# Patient Record
Sex: Male | Born: 1985
Health system: Southern US, Community
[De-identification: ages and names within clinical notes are randomized; demographics above are authoritative.]

## PROBLEM LIST (undated history)

## (undated) ENCOUNTER — Emergency Department (HOSPITAL_COMMUNITY)

## (undated) DIAGNOSIS — F329 Major depressive disorder, single episode, unspecified: Secondary | ICD-10-CM

## (undated) DIAGNOSIS — H5319 Other subjective visual disturbances: Secondary | ICD-10-CM

## (undated) DIAGNOSIS — M545 Low back pain: Secondary | ICD-10-CM

## (undated) DIAGNOSIS — F909 Attention-deficit hyperactivity disorder, unspecified type: Secondary | ICD-10-CM

## (undated) DIAGNOSIS — E119 Type 2 diabetes mellitus without complications: Secondary | ICD-10-CM

## (undated) DIAGNOSIS — H16001 Unspecified corneal ulcer, right eye: Secondary | ICD-10-CM

## (undated) HISTORY — DX: Attention-deficit hyperactivity disorder, unspecified type: F90.9

## (undated) HISTORY — DX: Major depressive disorder, single episode, unspecified: F32.9

## (undated) HISTORY — DX: Other subjective visual disturbances: H53.19

## (undated) HISTORY — DX: Type 2 diabetes mellitus without complications: E11.9

## (undated) HISTORY — PX: WISDOM TOOTH EXTRACTION: SHX21

## (undated) HISTORY — DX: Unspecified corneal ulcer, right eye: H16.001

## (undated) HISTORY — DX: Low back pain: M54.5

---
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
## 2006-09-30 ENCOUNTER — Ambulatory Visit (HOSPITAL_COMMUNITY): Payer: Self-pay | Admitting: Psychiatry

## 2006-10-23 ENCOUNTER — Ambulatory Visit (HOSPITAL_COMMUNITY): Payer: Self-pay | Admitting: Psychiatry

## 2006-11-25 ENCOUNTER — Ambulatory Visit (HOSPITAL_COMMUNITY): Payer: Self-pay | Admitting: Psychiatry

## 2007-01-20 ENCOUNTER — Ambulatory Visit (HOSPITAL_COMMUNITY): Payer: Self-pay | Admitting: Psychiatry

## 2007-04-21 ENCOUNTER — Ambulatory Visit (HOSPITAL_COMMUNITY): Payer: Self-pay | Admitting: Psychiatry

## 2007-07-16 ENCOUNTER — Ambulatory Visit (HOSPITAL_COMMUNITY): Payer: Self-pay | Admitting: Psychiatry

## 2008-01-23 ENCOUNTER — Ambulatory Visit (HOSPITAL_COMMUNITY): Payer: Self-pay | Admitting: Psychiatry

## 2008-05-03 ENCOUNTER — Ambulatory Visit (HOSPITAL_COMMUNITY): Payer: Self-pay | Admitting: Psychiatry

## 2008-09-01 ENCOUNTER — Ambulatory Visit (HOSPITAL_COMMUNITY): Payer: Self-pay | Admitting: Psychiatry

## 2008-10-06 ENCOUNTER — Ambulatory Visit (HOSPITAL_COMMUNITY): Payer: Self-pay | Admitting: Psychiatry

## 2008-11-17 ENCOUNTER — Ambulatory Visit (HOSPITAL_COMMUNITY): Payer: Self-pay | Admitting: Psychiatry

## 2008-12-27 ENCOUNTER — Ambulatory Visit (HOSPITAL_COMMUNITY): Payer: Self-pay | Admitting: Psychiatry

## 2009-02-02 ENCOUNTER — Ambulatory Visit (HOSPITAL_COMMUNITY): Payer: Self-pay | Admitting: Psychiatry

## 2009-07-13 ENCOUNTER — Ambulatory Visit (HOSPITAL_COMMUNITY): Payer: Self-pay | Admitting: Psychiatry

## 2009-08-22 ENCOUNTER — Ambulatory Visit (HOSPITAL_COMMUNITY): Payer: Self-pay | Admitting: Psychiatry

## 2012-02-12 ENCOUNTER — Ambulatory Visit: Payer: Self-pay | Admitting: Family Medicine

## 2012-02-12 DIAGNOSIS — F988 Other specified behavioral and emotional disorders with onset usually occurring in childhood and adolescence: Secondary | ICD-10-CM

## 2012-02-12 DIAGNOSIS — E119 Type 2 diabetes mellitus without complications: Secondary | ICD-10-CM

## 2012-02-12 LAB — POCT GLYCOSYLATED HEMOGLOBIN (HGB A1C): Hemoglobin A1C: 11.3

## 2012-02-12 MED ORDER — METFORMIN HCL 500 MG PO TABS: ORAL_TABLET | ORAL | Status: DC

## 2012-02-12 NOTE — Progress Notes (Signed)
  Subjective:    Patient ID: Carl Conley, male    DOB: 1986/09/14, 26 y.o.   MRN: [id number]  HPI  Patient presents after long absence from medical care. Diagnosed with DM age 26 Last medical visit 2011  ADD- Dr. Evelene Croon prescribes Adderrall  Applying for medical insurance and wanted to check A!C prior  o the insurance labs.  Student at [employer]  Review of Systems     Objective:   Physical Exam  Constitutional: He appears well-developed.  HENT:  Mouth/Throat: Oropharynx is clear and moist.  Neck: Neck supple. No thyromegaly present.  Cardiovascular: Normal rate, regular rhythm, normal heart sounds and intact distal pulses.   Pulmonary/Chest: Effort normal and breath sounds normal.  Abdominal: Soft.  Neurological: He is alert.  Skin:       No skin breakdown    Very strong body odor  Results for orders placed in visit on 02/12/12  POCT GLYCOSYLATED HEMOGLOBIN (HGB A1C)      Component Value Range   Hemoglobin A1C 11.3    BASIC METABOLIC PANEL      Component Value Range   Sodium 136  135 - 145 (mEq/L)   Potassium 4.3  3.5 - 5.3 (mEq/L)   Chloride 100  96 - 112 (mEq/L)   CO2 24  19 - 32 (mEq/L)   Glucose, Bld 385 (*) 70 - 99 (mg/dL)   BUN 10  6 - 23 (mg/dL)   Creat 4.09  8.11 - 9.14 (mg/dL)   Calcium 9.9  8.4 - 78.2 (mg/dL)       Assessment & Plan:   1. Diabetes mellitus  POCT glycosylated hemoglobin (Hb A1C), Basic metabolic panel, metFORMIN (GLUCOPHAGE) 500 MG tablet  2. ADD (attention deficit disorder)     Tremendous effort to educated patient regarding diabetes. Initially did not want to except any therapy. I encouraged him to start metformin, limit carbohydrates and exercise regularly. Patient did assure me that he would return once he secured medical insurance.  He is also aware of obtaining care for his DM at [facility] at a reduced cost.

## 2012-02-14 LAB — BASIC METABOLIC PANEL
CO2: 24 mEq/L (ref 19–32)
Chloride: 100 mEq/L (ref 96–112)
Glucose, Bld: 385 mg/dL — ABNORMAL HIGH (ref 70–99)
Potassium: 4.3 mEq/L (ref 3.5–5.3)
Sodium: 136 mEq/L (ref 135–145)

## 2012-02-27 ENCOUNTER — Encounter: Payer: Self-pay | Admitting: *Deleted

## 2016-04-13 ENCOUNTER — Encounter: Payer: Self-pay | Admitting: Dietician

## 2016-04-13 ENCOUNTER — Ambulatory Visit (INDEPENDENT_AMBULATORY_CARE_PROVIDER_SITE_OTHER): Payer: Self-pay | Admitting: Internal Medicine

## 2016-04-13 VITALS — BP 108/79 | HR 98 | Temp 98.6°F | Resp 18 | Ht 69.0 in | Wt 238.3 lb

## 2016-04-13 DIAGNOSIS — Z596 Low income: Secondary | ICD-10-CM

## 2016-04-13 DIAGNOSIS — E1165 Type 2 diabetes mellitus with hyperglycemia: Secondary | ICD-10-CM

## 2016-04-13 DIAGNOSIS — E119 Type 2 diabetes mellitus without complications: Secondary | ICD-10-CM | POA: Insufficient documentation

## 2016-04-13 DIAGNOSIS — F988 Other specified behavioral and emotional disorders with onset usually occurring in childhood and adolescence: Secondary | ICD-10-CM

## 2016-04-13 DIAGNOSIS — H16001 Unspecified corneal ulcer, right eye: Secondary | ICD-10-CM

## 2016-04-13 LAB — POCT GLYCOSYLATED HEMOGLOBIN (HGB A1C): HEMOGLOBIN A1C: 9.6

## 2016-04-13 LAB — GLUCOSE, CAPILLARY: Glucose-Capillary: 340 mg/dL — ABNORMAL HIGH (ref 65–99)

## 2016-04-13 MED ORDER — METFORMIN HCL 1000 MG PO TABS: 1000.0000 mg | ORAL_TABLET | Freq: Two times a day (BID) | ORAL | Status: DC

## 2016-04-13 NOTE — Patient Instructions (Signed)
Mr. Carl Conley,  I'm happy you came to establish care in our clinic today! It was really nice meeting you. Our main plan today is to continue your best to cut out sugars from your diet and exercise as much as you can every day. Keep walking daily. You're doing a great job. We will also start metformin. I've attached a sheet with instructions on how to start. You can start taking the medication tomorrow and go from there. Call our clinic number if you have any other questions. Also make sure to get all the documents on the blue sheet [facility], and we can help you out with getting insurance. Finally, make sure to make an appointment with Lupita Leashonna, our diabetes educator, so we can cater a nutrition plan that works best for you. We would like to see her back in about 3 months to check in on how you're doing, and you can always call us if you have any other questions sooner.  Have a great weekend, Reubin MilanBilly Sadhana Frater

## 2016-04-16 ENCOUNTER — Ambulatory Visit: Payer: Self-pay | Admitting: Pharmacist

## 2016-04-16 DIAGNOSIS — H16001 Unspecified corneal ulcer, right eye: Secondary | ICD-10-CM

## 2016-04-16 DIAGNOSIS — F988 Other specified behavioral and emotional disorders with onset usually occurring in childhood and adolescence: Secondary | ICD-10-CM | POA: Insufficient documentation

## 2016-04-16 HISTORY — DX: Unspecified corneal ulcer, right eye: H16.001

## 2016-04-16 MED ORDER — METFORMIN HCL 1000 MG PO TABS: 1000.0000 mg | ORAL_TABLET | Freq: Two times a day (BID) | ORAL | Status: DC

## 2016-04-16 MED FILL — metFORMIN HCL 1000 MG TABS: 1000 | 30 days supply | Qty: 60 | Fill #0

## 2016-04-16 NOTE — Assessment & Plan Note (Signed)
Patient with previous history of ADD on Adderall. However, he says that he has not been on medication for the past 2 years and his symptoms are well-controlled with focusing exercises and relaxation technique and do not cause him concerns throughout the day today life. -Appears clinically resolved for the past couple of years. No further management necessitated at this time

## 2016-04-16 NOTE — Assessment & Plan Note (Addendum)
Patient was diagnosed with diabetes at age 30, 12 years ago. He was previously on metformin, another oral agent he does not remember the name of, and Lantus. He has not been on any medication for the past several years, without hyperglycemic symptoms such as polyuria, polydipsia, increased fatigue, or other issues. He does say that he has some early numbness without any pain at the tips of his toes bilaterally. Exam does show slight decreased fine touch sensation without any notable lesions. His last A1c was 11.3 back in 2013. He has not been on medication since. 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 He has been vigilant about trying to control his diabetes with dietary modifications as well as regular exercise. He says he walks 1-1/4 miles per day for 5 days per week. He has cut out all sugary beverages and tries to eat carbs in moderation. He says he like to maximize control of his diabetes without medications other than metformin at this time. After extensive counseling, he is agreeable to starting metformin and would like to work with our diabetes educator regarding further ways in which he can maximize his nonpharmacologic treatment of diabetes. -Start metformin, with weekly increases of 500 mg to maximum dose of 2000 mg. Provided patient medication in clinic today, with [place] [facility] program application as well as appointment with [facility] -A1c today is 9.6 off medication. Our target should be less than 7 -Did not start statin repeat today, but should discuss possible initiation of MIST at next visit (with caveat of financial hardship) -Instructed patient to make appointment with Lupita Leashonna at his earliest convenience -RTC in 3 months for A1c recheck

## 2016-04-16 NOTE — Progress Notes (Signed)
Internal Medicine Clinic Attending  Case discussed with Dr. Kennedy at the time of the visit.  We reviewed the resident's history and exam and pertinent patient test results.  I agree with the assessment, diagnosis, and plan of care documented in the resident's note.  

## 2016-04-16 NOTE — Progress Notes (Addendum)
   Patient ID: Carl ReiningKevin S Conley male   DOB: 03-12-1986 30 y.o.   MRN: [id number]  Subjective:   HPI: [name] is a 30 y.o. with PMH of ADD and insulin-dependent diabetes who [facility] establishing care in our clinic as a new patient. Today, he overall feels well without any acute complaints.   Please see problem-based charting for status of medical issues pertinent to this visit.  PMH: Diabetes (dx age 30), ADD, corneal ulcer of rt eye  PSH: None  Meds: Acyclovir PO, prednisolone eye drops currently  Allergies: NKDA  FH: Depression in mother. No known cardiovascular disease, diabetes, or cancer in the family.  SH: Patient is a never smoker, no EtOH or recreational drug abuse. Earned college degree in IT. Currently unemployed, living with mother, looking for IT position.   Review of Systems: Pertinent items noted in HPI and remainder of comprehensive ROS otherwise negative.  Objective:  Physical Exam: Filed Vitals:   04/13/16 1555  BP: 108/79  Pulse: 98  Temp: 98.6 F (37 C)  TempSrc: Oral  Resp: 18  Height: 5\' 9"  (1.753 m)  Weight: 238 lb 4.8 oz (108.092 kg)  SpO2: 97%   Gen: Well-appearing, alert and oriented to person, place, and time HEENT: Oropharynx clear without erythema or exudate.  Neck: No cervical LAD, no thyromegaly or nodules, no JVD noted. CV: Normal rate, regular rhythm, no murmurs, rubs, or gallops Pulmonary: Normal effort, CTA bilaterally, no wheezing, rales, or rhonchi Abdominal: Soft, non-tender, non-distended, without rebound, guarding, or masses Extremities: Distal pulses 2+ in upper and lower extremities bilaterally, no tenderness, erythema or edema. Slight decrease to fine touch in the distal toes bilaterally. No ulcers or other lesions noted. Skin: No atypical appearing moles. No rashes  Assessment & Plan:  Please see problem-based charting for assessment and plan.  Reubin MilanBilly Kaia Depaolis, MD Resident Physician,  PGY-1 Department of Internal Medicine [place] \

## 2016-04-16 NOTE — Assessment & Plan Note (Signed)
Currently following with ophthalmologist, who referred him to us for diabetic management, for a corneal ulcer. Currently being treated with acyclovir and prednisolone eyedrops. He says his vision is slowly improving in his right eye. Denies any worsening symptoms at this time. -Follow-up ophthalmology recs -Continue current medications

## 2016-08-30 ENCOUNTER — Telehealth: Payer: Self-pay | Admitting: General Practice

## 2016-08-30 NOTE — Telephone Encounter (Signed)
APT. REMINDER CALL, LMTCB °

## 2016-08-31 ENCOUNTER — Encounter: Payer: Self-pay | Admitting: Internal Medicine

## 2016-08-31 ENCOUNTER — Ambulatory Visit (INDEPENDENT_AMBULATORY_CARE_PROVIDER_SITE_OTHER): Payer: Self-pay | Admitting: Internal Medicine

## 2016-08-31 VITALS — BP 102/68 | HR 87 | Temp 98.5°F | Ht 69.0 in | Wt 245.7 lb

## 2016-08-31 DIAGNOSIS — Z6836 Body mass index (BMI) 36.0-36.9, adult: Secondary | ICD-10-CM

## 2016-08-31 DIAGNOSIS — E119 Type 2 diabetes mellitus without complications: Secondary | ICD-10-CM

## 2016-08-31 DIAGNOSIS — G8929 Other chronic pain: Secondary | ICD-10-CM

## 2016-08-31 DIAGNOSIS — E669 Obesity, unspecified: Secondary | ICD-10-CM

## 2016-08-31 DIAGNOSIS — Z7984 Long term (current) use of oral hypoglycemic drugs: Secondary | ICD-10-CM

## 2016-08-31 DIAGNOSIS — M545 Low back pain: Secondary | ICD-10-CM

## 2016-08-31 LAB — POCT GLYCOSYLATED HEMOGLOBIN (HGB A1C): HEMOGLOBIN A1C: 6.2

## 2016-08-31 LAB — GLUCOSE, CAPILLARY: Glucose-Capillary: 170 mg/dL — ABNORMAL HIGH (ref 65–99)

## 2016-08-31 NOTE — Patient Instructions (Signed)
Please continue to take your medications as prescribed. Please continue to exercise and eat a healthy diet to help lose weight.   Thank you for visiting us today!

## 2016-08-31 NOTE — Progress Notes (Signed)
   CC: Health maintenance visit  HPI:  [name] is a 30 y.o. male with PMHx detailed below presenting for health maintenance visit, diabetes check.  See problem based assessment and plan below for additional details.  Past Medical History:  Diagnosis Date  . Diabetes mellitus (HCC)    dx date 2004 at age 30    Review of Systems: Review of Systems  Constitutional: Negative for chills, fever and malaise/fatigue.  Respiratory: Negative for cough and shortness of breath.   Cardiovascular: Negative for chest pain, orthopnea and leg swelling.  Gastrointestinal: Negative for abdominal pain, constipation and diarrhea.  Musculoskeletal: Positive for back pain.  Neurological: Negative for headaches.  Psychiatric/Behavioral: Negative for depression. The patient is not nervous/anxious.   All other systems reviewed and are negative.    Physical Exam: Vitals:   08/31/16 1617  BP: 102/68  Pulse: 87  Temp: 98.5 F (36.9 C)  TempSrc: Oral  SpO2: 100%  Weight: 245 lb 11.2 oz (111.4 kg)  Height: 5\' 9"  (1.753 m)   Body mass index is 36.28 kg/m. GENERAL- Casually-dressed man sitting comfortably in exam room chair, alert, in no distress, obese HEENT- Atraumatic, PERRL, EOMI, moist mucous membranes CARDIAC- Regular rate and rhythm, no murmurs, rubs or gallops. RESP- Clear to ascultation bilaterally, no wheezing or crackles, normal work of breathing ABDOMEN- Obese, normoactive bowel sounds, soft, nontender, nondistended BACK- Mild kyphotic curvature, no paraspinal tenderness, no CVA tenderness. EXTREMITIES- Normal bulk and range of motion, no edema, 2+ peripheral pulses SKIN- Warm, dry, intact, without visible rash PSYCH- Appropriate affect, clear speech, thoughts linear and goal-directed  Assessment & Plan:   See encounters tab for problem based medical decision making.  Patient seen with Dr. Cleda DaubE. Hoffman

## 2016-09-01 DIAGNOSIS — M545 Low back pain, unspecified: Secondary | ICD-10-CM | POA: Insufficient documentation

## 2016-09-01 HISTORY — DX: Low back pain, unspecified: M54.50

## 2016-09-01 NOTE — Assessment & Plan Note (Signed)
Complaining of mild intermittent low back pain, worsens with activity. Patient with sedentary lifestyle, obese - likely weight, posture, and core muscle deconditioning-related.   Plan: - Encouraged exercise and weight loss - Provided information on low back pain and encouraged to do regular back-strengthening exercises, improve posture and limit seated/hunched posture

## 2016-09-01 NOTE — Assessment & Plan Note (Addendum)
HbA1c 6.2 today from 9.6 in June 2017, significant improvement and now at goal. Patient reports walking more often and has cut soda out of his diet. Occasionally has GI upset with fatty foods since Metformin increased. Endorses ongoing sedentary lifestyle. Encouraged more exercise, diet modification, and weight loss. Patient may be a candidate for bariatric surgery. Foot exam today - no findings, mild numbness in feet but overall sensation is good. Patient reports checking his feet often. Patient has already voided before visit, unable to produce urine sample to assess microalbuminuria.  Plan: - Continue Metformin 1000mg   - Encourage increased activity and weight loss - Obtain microalbumin and BMP at next visit to assess renal function and need for ACEi

## 2016-09-03 NOTE — Progress Notes (Signed)
Internal Medicine Clinic Attending  I saw and evaluated the patient.  I personally confirmed the key portions of the history and exam documented by Dr. Johnson and I reviewed pertinent patient test results.  The assessment, diagnosis, and plan were formulated together and I agree with the documentation in the resident's note.  

## 2016-09-18 ENCOUNTER — Other Ambulatory Visit: Payer: Self-pay | Admitting: Internal Medicine

## 2016-09-18 DIAGNOSIS — E119 Type 2 diabetes mellitus without complications: Secondary | ICD-10-CM

## 2016-10-31 ENCOUNTER — Ambulatory Visit (INDEPENDENT_AMBULATORY_CARE_PROVIDER_SITE_OTHER): Payer: Self-pay | Admitting: Neurology

## 2016-10-31 ENCOUNTER — Encounter: Payer: Self-pay | Admitting: Neurology

## 2016-10-31 VITALS — BP 121/78 | HR 109 | Ht 69.0 in | Wt 244.0 lb

## 2016-10-31 DIAGNOSIS — E119 Type 2 diabetes mellitus without complications: Secondary | ICD-10-CM

## 2016-10-31 DIAGNOSIS — F32A Depression, unspecified: Secondary | ICD-10-CM

## 2016-10-31 DIAGNOSIS — F329 Major depressive disorder, single episode, unspecified: Secondary | ICD-10-CM

## 2016-10-31 DIAGNOSIS — H539 Unspecified visual disturbance: Secondary | ICD-10-CM

## 2016-10-31 HISTORY — DX: Depression, unspecified: F32.A

## 2016-10-31 MED ORDER — LAMOTRIGINE 100 MG PO TABS: ORAL_TABLET | ORAL | 11 refills | Status: DC

## 2016-10-31 MED ORDER — CITALOPRAM HYDROBROMIDE 20 MG PO TABS: 20.0000 mg | ORAL_TABLET | Freq: Every day | ORAL | 5 refills | Status: DC

## 2016-10-31 NOTE — Progress Notes (Signed)
[facility]  PATIENT: Carl Conley DOB: August 14, 1986  REFERRING DOCTOR OR PCP:  Carl Farrichard Groat, MD (ophtho)   PCP is Althia FortsAdam Conley SOURCE: Patient, notes and data from Dr. Dione Conley  _________________________________   HISTORICAL  CHIEF COMPLAINT:  Chief Complaint  Patient presents with  . New Patient (Initial Visit)    Rm 13. Patient states that he has had "visual snow" for several weeks. Slow on set.     HISTORY OF PRESENT ILLNESS:  I had the [facility] you patient[facility] neurolog[facility] consultation regarding his visual disturbance.  He is a 30 year old man with non-insulin-dependent diabetes mellitus who noted a visual disturbance about 5- 6 weeks ago. He sees static (like an analog TV in between channels) obscuring his visual.    He notes it most upon awakening out of the right eye but it is more equal later in the day.   He notes it non-stop, even with eyes closed,.  He has not been placed on any medications so far.   He feels symptoms have not changed the past month.  He denies any significant eye pain.   He denies diplopia but notes a little 'ghosting' in his vision if he tiens eyes quick.  He saw Dr. Dione Conley, ophthalmology,. 10/15/2016 and 10/25/2016. Exam showed best corrected vision 20/70 OD and 20/60 OS extraocular movements were intact. Pupils were equally reactive. Intraocular pressures were normal.  He has Herpes keratitis OD, diagnosed last year and is on valacyclovir and prednisolone eye drops.     He has felt more depressed over the last year. He also has noted some decreased memory. He has ADHD but does not feel that his focus has worsened. He has decreased appetite. He feels he is sleeping fairly well now. He was once told that he snores but has never woken up gasping for air.  REVIEW OF SYSTEMS: Constitutional: No fevers, chills, sweats, or change in appetite Eyes: as above Ear, nose and throat:  No hearing loss, ear pain, nasal congestion, sore throat Cardiovascular: No chest pain, palpitations Respiratory: No shortness of breath at rest or with exertion.   No wheezes GastrointestinaI: No nausea, vomiting, diarrhea, abdominal pain, fecal incontinence Genitourinary: No dysuria, urinary retention or frequency.  No nocturia. Musculoskeletal: No neck pain, back pain Integumentary: Notes rash x a few weeks left ankle.  Neurological: as above Psychiatric: Notes depression but no anxiety Endocrine: No palpitations, diaphoresis, change in appetite, change in weigh or increased thirst Hematologic/Lymphatic: No anemia, purpura, petechiae. Allergic/Immunologic: No itchy/runny eyes, nasal congestion, recent allergic reactions, rashes  ALLERGIES: No Known Allergies  HOME MEDICATIONS:  Current Outpatient Prescriptions:  .  metFORMIN (GLUCOPHAGE) 1000 MG tablet, TAKE 1 TABLET BY MOUTH TWICE A DAY WITH MEALS, Disp: 60 tablet, Rfl: 3 .  prednisoLONE acetate (PRED FORTE) 1 % ophthalmic suspension, PLACE 1 DROP INTO BOTH EYES 4 TIMES A DAY, Disp: , Rfl: 3 .  sodium chloride (MURO 128) 2 % ophthalmic solution, 1 drop., Disp: , Rfl:  .  valACYclovir (VALTREX) 500 MG tablet, 1 TABLET BY MOUTH TWICE A DAY, Disp: , Rfl: 6  PAST MEDICAL HISTORY: Past Medical History:  Diagnosis Date  . ADHD   . Diabetes mellitus (HCC)    dx date 2004 at age 30    PAST SURGICAL HISTORY: Past Surgical History:  Procedure Laterality Date  . WISDOM TOOTH EXTRACTION      FAMILY HISTORY: Family History  Problem Relation Age of Onset  .  Depression Mother     SOCIAL HISTORY:  Social History   Social History  . Marital status: Single    Spouse name: N/A  . Number of children: 0  . Years of education: BA   Occupational History  . N/A    Social History Main Topics  . Smoking status: Never Smoker  . Smokeless tobacco: Never Used  . Alcohol use No  . Drug use: No  . Sexual activity: Not on file     Other Topics Concern  . Not on file   Social History Narrative   Denies caffeine use     PHYSICAL EXAM  Vitals:   10/31/16 0819  BP: 121/78  Pulse: (!) 109  Weight: 244 lb (110.7 kg)  Height: 5\' 9"  (1.753 m)    Body mass index is 36.03 kg/m.   General: The patient is well-developed and well-nourished and in no acute distress  Eyes:  Funduscopic exam shows normal optic discs and retinal vessels.  Neck: The neck is supple, no carotid bruits are noted.  The neck is nontender.  Cardiovascular: The heart has a regular rate and rhythm with a normal S1 and S2. There were no murmurs, gallops or rubs.   Skin: Extremities with left ankle rash.  Musculoskeletal:  Back is nontender  Neurologic Exam  Mental status: The patient is alert and oriented x 3 at the time of the examination. The patient has apparent normal recent and remote memory, with an apparently normal attention span and concentration ability.   Speech is normal.  Cranial nerves: Extraocular movements are full. Pupils are equal, round, and reactive to light and accomodation.    Facial symmetry is present. There is good facial sensation to soft touch bilaterally.Facial strength is normal.  Trapezius and sternocleidomastoid strength is normal. No dysarthria is noted.  The tongue is midline, and the patient has symmetric elevation of the soft palate. No obvious hearing deficits are noted.  Motor:  Muscle bulk is normal.   Tone is normal. Strength is  5 / 5 in all 4 extremities.   Sensory: Sensory testing is intact to soft touch and vibration sensation in all 4 extremities.  Coordination: Cerebellar testing reveals good finger-nose-finger and heel-to-shin bilaterally.  Gait and station: Station is normal.   Gait is normal. Tandem gait is normal. Romberg is negative.   Reflexes: Deep tendon reflexes are symmetric and normal bilaterally.   Plantar responses are flexor.    DIAGNOSTIC DATA (LABS, IMAGING, TESTING) -  I reviewed patient records, labs, notes, testing and imaging myself where available.  No results found for: WBC, HGB, HCT, MCV, PLT    Component Value Date/Time   NA 136 02/12/2012 2053   K 4.3 02/12/2012 2053   CL 100 02/12/2012 2053   CO2 24 02/12/2012 2053   GLUCOSE 385 (H) 02/12/2012 2053   BUN 10 02/12/2012 2053   CREATININE 0.78 02/12/2012 2053   CALCIUM 9.9 02/12/2012 2053   No results found for: CHOL, HDL, LDLCALC, LDLDIRECT, TRIG, CHOLHDL Lab Results  Component Value Date   HGBA1C 6.2 08/31/2016       ASSESSMENT AND PLAN  Type 2 diabetes mellitus without complication, unspecified long term insulin use status (HCC)  Visual disturbance  Depression, unspecified depression type   In summary, Carl Conley is a 30 year old man who has had visual disturbance, like looking through static for the past or two.   His symptoms are persistent with visuals snow or visual static.   This is a  poorly understood syndrome. Like most patients with this problem, he does not report migraine headaches. The patients have responded to medications and I will initiate lamotrigine grams daily and titrate up to 100 mg by mouth twice a day. If not better, consider verapamil or Diamox which also have been reported to have some benefit.    Additionally, he appears to have a mild depression and I will start citalopram 20 mg daily. He will return to see me in 2-3 months or sooner if there are new or worsening.  Thank you for asking me to see Carl Conley for a neurologic consultation. Please let me know if I can be of further assistance with her or other patients in the future.    Hillman Attig A. Epimenio Foot, MD, PhD 10/31/2016, 8:36 AM Certified in Neurology, Clinical Neurophysiology, Sleep Medicine, Pain Medicine and Neuroimaging  [facility] [address], [address] [place], [place] [address] [phone]

## 2016-10-31 NOTE — Patient Instructions (Signed)
The pharmacy has the prescription for lamotrigine 100 mg tablets. For 5 days, just take one half pill a day. For the next 5 days, take one half pill twice a day. For the next 5 days, take one half pill 3 times a day Then start taking one pill twice a day from this point on.    In the future, we may increase the dose further.  If you get a rash, need to stop the medication and not take it again. 

## 2016-12-07 ENCOUNTER — Ambulatory Visit (INDEPENDENT_AMBULATORY_CARE_PROVIDER_SITE_OTHER): Payer: Self-pay | Admitting: Internal Medicine

## 2016-12-07 ENCOUNTER — Encounter: Payer: Self-pay | Admitting: Internal Medicine

## 2016-12-07 VITALS — BP 105/75 | HR 92 | Temp 99.0°F | Wt 251.9 lb

## 2016-12-07 DIAGNOSIS — F909 Attention-deficit hyperactivity disorder, unspecified type: Secondary | ICD-10-CM | POA: Insufficient documentation

## 2016-12-07 DIAGNOSIS — F329 Major depressive disorder, single episode, unspecified: Secondary | ICD-10-CM

## 2016-12-07 DIAGNOSIS — Z79899 Other long term (current) drug therapy: Secondary | ICD-10-CM

## 2016-12-07 DIAGNOSIS — H538 Other visual disturbances: Secondary | ICD-10-CM

## 2016-12-07 DIAGNOSIS — Z7984 Long term (current) use of oral hypoglycemic drugs: Secondary | ICD-10-CM

## 2016-12-07 DIAGNOSIS — E118 Type 2 diabetes mellitus with unspecified complications: Secondary | ICD-10-CM

## 2016-12-07 DIAGNOSIS — H539 Unspecified visual disturbance: Secondary | ICD-10-CM

## 2016-12-07 DIAGNOSIS — F32A Depression, unspecified: Secondary | ICD-10-CM

## 2016-12-07 DIAGNOSIS — E119 Type 2 diabetes mellitus without complications: Secondary | ICD-10-CM

## 2016-12-07 LAB — GLUCOSE, CAPILLARY: Glucose-Capillary: 160 mg/dL — ABNORMAL HIGH (ref 65–99)

## 2016-12-07 LAB — POCT GLYCOSYLATED HEMOGLOBIN (HGB A1C): Hemoglobin A1C: 6.1

## 2016-12-07 NOTE — Assessment & Plan Note (Addendum)
Seen by neurology in Dec 2017 for several weeks/months of visual snow symptoms that disrupt his ability to drive at night, not noticeable during the day. Has been prescribed Lamictal 100 mg BID for this. Experienced some "feeling sick" when this medication was started and as the dose was increased but resolved in a few days. Reports improvement in the visual snow since started Lamictal.  Plan:  - Check CBC, CMP, monitoring LFTs and agranulocytosis with Lamictal use - Continue Lamictal 100 mg BID and will f/u with neurology in March

## 2016-12-07 NOTE — Assessment & Plan Note (Signed)
A1c 6.1 today, very well controlled on Metformin 1000 BID  Plan: - Continue Metformin - Encouraged diet, exercise, weight loss - F/up in 6 months, needs urine microalbumin but requires advanced warning as he uses the restroom before his visit

## 2016-12-07 NOTE — Patient Instructions (Signed)
Please continue to take your medications as prescribed. We are glad that they have been effective in controlling your diabetes and improving your visual snow and depression symptoms.  Please continue to adhere to a healthy diet, exercise regularly, and strive to lose weight.   We will call with your lab test results.  Please return in 6 months for follow up visit

## 2016-12-07 NOTE — Progress Notes (Signed)
   CC: Diabetes follow up  HPI:  [name] is a 31 y.o. male with PMHx detailed below presenting with no acute complaints, visiting today for follow up of his diabetes.   See problem based assessment and plan below for additional details.  Past Medical History:  Diagnosis Date  . ADHD   . 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 Depression 10/31/2016  . Diabetes mellitus (HCC)    dx date 2004 at age 31  . Low back pain 09/01/2016  . Visual disturbance 10/31/2016    Review of Systems: Review of Systems  Constitutional: Negative for chills, fever and weight loss.  Eyes: Negative for blurred vision and double vision.  Respiratory: Negative for cough and shortness of breath.   Cardiovascular: Negative for chest pain and palpitations.  Gastrointestinal: Negative for abdominal pain, constipation, diarrhea, nausea and vomiting.  Psychiatric/Behavioral: Positive for depression. The patient is not nervous/anxious.      Physical Exam: Vitals:   12/07/16 1356  BP: 105/75  Pulse: 92  Temp: 99 F (37.2 C)  TempSrc: Oral  SpO2: 99%  Weight: 251 lb 14.4 oz (114.3 kg)   Body mass index is 37.2 kg/m. GENERAL- Casually dressed obese man sitting comfortably in exam room chair, alert, in no distress HEENT- Atraumatic, PERRL, mildly injected sclera,, moist mucous membranes CARDIAC- Regular rate and rhythm, no murmurs, rubs or gallops. RESP- Clear to ascultation bilaterally, normal work of breathing ABDOMEN- Obese, soft, nontender, nondistended NEURO- Alert and oriented EXTREMITIES- Normal bulk and range of motion, no edema, 2+ peripheral pulses SKIN- Warm, dry, intact, without visible rash PSYCH- Normal affect, clear speech, thoughts linear and goal-directed  Assessment & Plan:   See encounters tab for problem based medical decision making.  Patient discussed with Dr. Heide SparkNarendra

## 2016-12-07 NOTE — Assessment & Plan Note (Signed)
Started on Celexa 20 mg daily in December by neurology - reports his depression symptoms are noticeably better.  Plan: continue Celexa  20 mg QD

## 2016-12-08 LAB — CMP14 + ANION GAP
ALBUMIN: 4.7 g/dL (ref 3.5–5.5)
ALK PHOS: 74 IU/L (ref 39–117)
ALT: 93 IU/L — ABNORMAL HIGH (ref 0–44)
ANION GAP: 21 mmol/L — AB (ref 10.0–18.0)
AST: 29 IU/L (ref 0–40)
Albumin/Globulin Ratio: 1.7 (ref 1.2–2.2)
BILIRUBIN TOTAL: 0.6 mg/dL (ref 0.0–1.2)
BUN / CREAT RATIO: 18 (ref 9–20)
BUN: 13 mg/dL (ref 6–20)
CHLORIDE: 95 mmol/L — AB (ref 96–106)
CO2: 23 mmol/L (ref 18–29)
Calcium: 9.8 mg/dL (ref 8.7–10.2)
Creatinine, Ser: 0.73 mg/dL — ABNORMAL LOW (ref 0.76–1.27)
GFR calc Af Amer: 144 mL/min/{1.73_m2} (ref >59)
GFR calc non Af Amer: 124 mL/min/{1.73_m2} (ref >59)
GLUCOSE: 155 mg/dL — AB (ref 65–99)
Globulin, Total: 2.7 g/dL (ref 1.5–4.5)
Potassium: 4.5 mmol/L (ref 3.5–5.2)
Sodium: 139 mmol/L (ref 134–144)
Total Protein: 7.4 g/dL (ref 6.0–8.5)

## 2016-12-08 LAB — CBC
HEMATOCRIT: 44.2 % (ref 37.5–51.0)
Hemoglobin: 15.7 g/dL (ref 13.0–17.7)
MCH: 30.3 pg (ref 26.6–33.0)
MCHC: 35.5 g/dL (ref 31.5–35.7)
MCV: 85 fL (ref 79–97)
PLATELETS: 213 10*3/uL (ref 150–379)
RBC: 5.19 x10E6/uL (ref 4.14–5.80)
RDW: 14.2 % (ref 12.3–15.4)
WBC: 10.1 10*3/uL (ref 3.4–10.8)

## 2016-12-10 NOTE — Progress Notes (Signed)
Internal Medicine Clinic Attending  Case discussed with Dr. Johnson at the time of the visit.  We reviewed the resident's history and exam and pertinent patient test results.  I agree with the assessment, diagnosis, and plan of care documented in the resident's note.  

## 2017-01-09 ENCOUNTER — Ambulatory Visit (INDEPENDENT_AMBULATORY_CARE_PROVIDER_SITE_OTHER): Payer: Self-pay | Admitting: Neurology

## 2017-01-09 ENCOUNTER — Encounter: Payer: Self-pay | Admitting: Neurology

## 2017-01-09 VITALS — BP 99/67 | HR 95 | Resp 16 | Ht 69.0 in | Wt 253.5 lb

## 2017-01-09 DIAGNOSIS — H539 Unspecified visual disturbance: Secondary | ICD-10-CM

## 2017-01-09 DIAGNOSIS — F329 Major depressive disorder, single episode, unspecified: Secondary | ICD-10-CM

## 2017-01-09 DIAGNOSIS — F32A Depression, unspecified: Secondary | ICD-10-CM

## 2017-01-09 DIAGNOSIS — F909 Attention-deficit hyperactivity disorder, unspecified type: Secondary | ICD-10-CM

## 2017-01-09 MED ORDER — VERAPAMIL HCL ER 180 MG PO TBCR: 180.0000 mg | EXTENDED_RELEASE_TABLET | Freq: Every day | ORAL | 5 refills | Status: DC

## 2017-01-09 NOTE — Progress Notes (Signed)
[facility]  PATIENT: Carl Conley DOB: Jan 01, 1986  REFERRING DOCTOR OR PCP:  Thana Farrichard Groat, MD (ophtho)   PCP is Althia FortsAdam Johnson SOURCE: Patient, notes and data from Dr. Dione BoozeGroat  _________________________________   HISTORICAL  CHIEF COMPLAINT:  Chief Complaint  Patient presents with  . Visual Disturbance    Sts. he is taking Lamictal 100mg  bid but still experiencing "visual snow."   Sts. mood is "definitely improved" with Citalopram./fim    HISTORY OF PRESENT ILLNESS:  Carl Conley is a 31 year old man with non-insulin-dependent diabetes mellitus who has had visual disturbance since November. He sees static (like an analog TV in between channels) obscuring his visual. The symptoms are constant.    He notes it most upon awakening out of the right eye but it is more equal later in the day.   He notes it non-stop, even with eyes closed,.  He has not been placed on any medications so far.   He feels symptoms have not changed the past month.  He denies any significant eye pain.   He denies diplopia but notes a little 'ghosting' in his vision if he tiens eyes quick.  He saw Dr. Dione BoozeGroat, ophthalmology,. 10/15/2016 and 10/25/2016. Exam showed best corrected vision 20/70 OD and 20/60 OS extraocular movements were intact. Pupils were equally reactive. Intraocular pressures were normal.   He has Herpes keratitis OD, diagnosed last year and is on valacyclovir and prednisolone eye drops.     At the last visit we tried lamotrigine.   Unfortunately, he does not feel the visual symptoms are any better.     Mood is doing better since starting citalopram.   He notes much less depression.   He has ADHD but does not feel that his focus has worsened. He has decreased appetite. He feels he is sleeping fairly well now.   Other: He has been told that he might have Asperger's syndrome and is being referred to a psychiatrist for further evaluation.   REVIEW OF SYSTEMS: Constitutional: No  fevers, chills, sweats, or change in appetite Eyes: as above Ear, nose and throat: No hearing loss, ear pain, nasal congestion, sore throat Cardiovascular: No chest pain, palpitations Respiratory: No shortness of breath at rest or with exertion.   No wheezes GastrointestinaI: No nausea, vomiting, diarrhea, abdominal pain, fecal incontinence Genitourinary: No dysuria, urinary retention or frequency.  No nocturia. Musculoskeletal: No neck pain, back pain Integumentary: Notes rash x a few weeks left ankle.  Neurological: as above Psychiatric: Notes depression but no anxiety Endocrine: No palpitations, diaphoresis, change in appetite, change in weigh or increased thirst Hematologic/Lymphatic: No anemia, purpura, petechiae. Allergic/Immunologic: No itchy/runny eyes, nasal congestion, recent allergic reactions, rashes  ALLERGIES: No Known Allergies  HOME MEDICATIONS:  Current Outpatient Prescriptions:  .  citalopram (CELEXA) 20 MG tablet, Take 1 tablet (20 mg total) by mouth daily., Disp: 30 tablet, Rfl: 5 .  lamoTRIgine (LAMICTAL) 100 MG tablet, 1/2 po qd x 5 d, then 1/2 po bid x 5d, then 1/2 po tid x 5 d, then 1 po bid, Disp: 60 tablet, Rfl: 11 .  metFORMIN (GLUCOPHAGE) 1000 MG tablet, TAKE 1 TABLET BY MOUTH TWICE A DAY WITH MEALS, Disp: 60 tablet, Rfl: 3 .  prednisoLONE acetate (PRED FORTE) 1 % ophthalmic suspension, PLACE 1 DROP INTO BOTH EYES 4 TIMES A DAY, Disp: , Rfl: 3 .  valACYclovir (VALTREX) 500 MG tablet, 1 TABLET BY MOUTH TWICE A DAY, Disp: , Rfl: 6  PAST MEDICAL HISTORY: Past  Medical History:  Diagnosis Date  . ADHD   . 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 Depression 10/31/2016  . Diabetes mellitus (HCC)    dx date 2004 at age 76  . Low back pain 09/01/2016  . Visual disturbance 10/31/2016    PAST SURGICAL HISTORY: Past Surgical History:  Procedure Laterality Date  . WISDOM TOOTH EXTRACTION      FAMILY HISTORY: Family History  Problem Relation Age of  Onset  . Depression Mother     SOCIAL HISTORY:  Social History   Social History  . Marital status: Single    Spouse name: N/A  . Number of children: 0  . Years of education: BA   Occupational History  . N/A    Social History Main Topics  . Smoking status: Never Smoker  . Smokeless tobacco: Never Used  . Alcohol use No  . Drug use: No  . Sexual activity: Not on file   Other Topics Concern  . Not on file   Social History Narrative   Denies caffeine use     PHYSICAL EXAM  Vitals:   01/09/17 1457  BP: 99/67  Pulse: 95  Resp: 16  Weight: 253 lb 8 oz (115 kg)  Height: 5\' 9"  (1.753 m)    Body mass index is 37.44 kg/m.   General: The patient is well-developed and well-nourished and in no acute distress  Eyes:  Funduscopic exam shows normal optic discs and retinal vessels.  Neck: The neck is supple.   The neck is nontender.   Neurologic Exam  Mental status: The patient is alert and oriented x 3 at the time of the examination. The patient has apparent normal recent and remote memory, with an apparently normal attention span and concentration ability.   Speech is normal.  Cranial nerves: Extraocular movements are full. Pupils are equal, round, and reactive to light and accomodation.   He has normal facial strength and sensation..  Trapezius and sternocleidomastoid strength is normal. No dysarthria is noted.  The tongue is midline, and the patient has symmetric elevation of the soft palate. No obvious hearing deficits are noted.  Motor:  Muscle bulk is normal.   Tone is normal. Strength is  5 / 5 in all 4 extremities.   Sensory: Sensory testing is intact to soft touch and vibration sensation in all 4 extremities.  Coordination: Cerebellar testing reveals good finger-nose-finger and heel-to-shin bilaterally.  Gait and station: Station is normal.   Gait is normal. Tandem gait is normal. Romberg is negative.   Reflexes: Deep tendon reflexes are symmetric and  normal bilaterally.        DIAGNOSTIC DATA (LABS, IMAGING, TESTING) - I reviewed patient records, labs, notes, testing and imaging myself where available.  Lab Results  Component Value Date   WBC 10.1 12/07/2016   HCT 44.2 12/07/2016   MCV 85 12/07/2016   PLT 213 12/07/2016      Component Value Date/Time   NA 139 12/07/2016 1409   K 4.5 12/07/2016 1409   CL 95 (L) 12/07/2016 1409   CO2 23 12/07/2016 1409   GLUCOSE 155 (H) 12/07/2016 1409   GLUCOSE 385 (H) 02/12/2012 2053   BUN 13 12/07/2016 1409   CREATININE 0.73 (L) 12/07/2016 1409   CREATININE 0.78 02/12/2012 2053   CALCIUM 9.8 12/07/2016 1409   PROT 7.4 12/07/2016 1409   ALBUMIN 4.7 12/07/2016 1409   AST 29 12/07/2016 1409   ALT 93 (H) 12/07/2016 1409   ALKPHOS 74  12/07/2016 1409   BILITOT 0.6 12/07/2016 1409   GFRNONAA 124 12/07/2016 1409   GFRAA 144 12/07/2016 1409   No results found for: CHOL, HDL, LDLCALC, LDLDIRECT, TRIG, CHOLHDL Lab Results  Component Value Date   HGBA1C 6.1 12/07/2016       ASSESSMENT AND PLAN  Visual disturbance  Depression, unspecified depression type  Attention deficit hyperactivity disorder (ADHD), unspecified ADHD type   1.   Taper lamotrigine off over next week 2.   Start verapamil 180 mg daily and titrate as needed 3.   Call in 6 weeks, if no better stop verapamil and try Diamox 4.   RTC 3 months or call sooner if ptoblems  Kailan Carmen A. Epimenio Foot, MD, PhD 01/09/2017, 3:13 PM Certified in Neurology, Clinical Neurophysiology, Sleep Medicine, Pain Medicine and Neuroimaging  [facility] [address], [address] [place], [place] [address] [phone]

## 2017-01-16 ENCOUNTER — Other Ambulatory Visit: Payer: Self-pay | Admitting: Internal Medicine

## 2017-01-16 DIAGNOSIS — E119 Type 2 diabetes mellitus without complications: Secondary | ICD-10-CM

## 2017-02-08 ENCOUNTER — Encounter: Payer: Self-pay | Admitting: Internal Medicine

## 2017-02-21 ENCOUNTER — Telehealth: Payer: Self-pay | Admitting: Neurology

## 2017-02-21 MED ORDER — ACETAZOLAMIDE 250 MG PO TABS: 250.0000 mg | ORAL_TABLET | Freq: Two times a day (BID) | ORAL | 1 refills | Status: DC

## 2017-02-21 NOTE — Telephone Encounter (Signed)
I have spoken with Carl Conley.  He is still experiencing visual snow.  No relief with Verapamil.  Per RAS, next med is Diamox.  Will speak with RAS and call pt. back/fim

## 2017-02-21 NOTE — Addendum Note (Signed)
Addended by: Candis Schatz I on: 02/21/2017 05:25 PM   Modules accepted: Orders

## 2017-02-21 NOTE — Telephone Encounter (Signed)
Spoke with Caryn Bee again, and per RAS, ok to stop Verapamil (no need to titrate off), and start Diamox  bid.  He verbalized understanding of same and is agreeable.  Rx. escribed to [facility] [address] per his request/fim

## 2017-02-21 NOTE — Telephone Encounter (Signed)
Patient called office stating his condition has not improved did not go into detail.  Please call

## 2017-04-10 ENCOUNTER — Other Ambulatory Visit: Payer: Self-pay | Admitting: Neurology

## 2017-04-15 ENCOUNTER — Encounter: Payer: Self-pay | Admitting: *Deleted

## 2017-04-16 ENCOUNTER — Encounter: Payer: Self-pay | Admitting: Neurology

## 2017-04-16 ENCOUNTER — Ambulatory Visit (INDEPENDENT_AMBULATORY_CARE_PROVIDER_SITE_OTHER): Payer: Self-pay | Admitting: Neurology

## 2017-04-16 VITALS — BP 95/66 | HR 82 | Resp 16 | Ht 69.0 in | Wt 253.0 lb

## 2017-04-16 DIAGNOSIS — H539 Unspecified visual disturbance: Secondary | ICD-10-CM

## 2017-04-16 DIAGNOSIS — F32A Depression, unspecified: Secondary | ICD-10-CM

## 2017-04-16 DIAGNOSIS — F329 Major depressive disorder, single episode, unspecified: Secondary | ICD-10-CM

## 2017-04-16 DIAGNOSIS — E118 Type 2 diabetes mellitus with unspecified complications: Secondary | ICD-10-CM

## 2017-04-16 MED ORDER — NORTRIPTYLINE HCL 25 MG PO CAPS: 25.0000 mg | ORAL_CAPSULE | Freq: Every day | ORAL | 3 refills | Status: DC

## 2017-04-16 NOTE — Progress Notes (Signed)
[facility]  PATIENT: Carl ReiningKevin S Conley DOB: [birth year]  REFERRING DOCTOR OR PCP:  Thana Farrichard Groat, MD (ophtho)   PCP is Althia FortsAdam Johnson SOURCE: Patient, notes and data from Dr. Dione BoozeGroat  _________________________________   HISTORICAL  CHIEF COMPLAINT:  Chief Complaint  Patient presents with  . Visual Disturbance    Sts. he is tolerating Diamox well, just thinks it makes him more thirst.  Sts. no change in vision--still seeing visual snow.  /fim  . Mood    Sts. continues to do well with Citalopram/fim    HISTORY OF PRESENT ILLNESS:  Carl MadeiraKevin Audino is a 31 year old man with non-insulin-dependent diabetes mellitus who has had visual disturbance since November 2017.     Visual Disturbance:   He continues to note the static visual disturbance worse on the right.  There is no diplopia. There is no eye pain.  We have tried several different medications to see if they can help his symptoms. Initially lamotrigine was started. However, he did not think that it helped any. We then tried verapamil but there was no benefit.   Most recently, Diamox was started. Although he has tolerated all of these medications none of them has been helpful  Visual disturbance history:   He sees static (like an analog TV in between channels) obscuring his visual. The symptoms are constant.    He notes it most upon awakening out of the right eye but it is more equal later in the day.   He notes it non-stop, even with eyes closed,.     He saw Dr. Dione BoozeGroat, ophthalmology,. 10/15/2016 and 10/25/2016. Exam showed best corrected vision 20/70 OD and 20/60 OS extraocular movements were intact. Pupils were equally reactive. Intraocular pressures were normal.   He has Herpes keratitis OD, diagnosed last year and is on valacyclovir and prednisolone eye drops.      Mood:  His depression is doing better on citalopram. He tolerates it well.  He notes much less depression.   He has ADHD but does not feel that his focus  has worsened. He has decreased appetite. He feels he is sleeping fairly well now.   Other: She was referred to Psychiatry for a possible diagnosis of Asperger's syndrome.       REVIEW OF SYSTEMS: Constitutional: No fevers, chills, sweats, or change in appetite Eyes: as above Ear, nose and throat: No hearing loss, ear pain, nasal congestion, sore throat Cardiovascular: No chest pain, palpitations Respiratory: No shortness of breath at rest or with exertion.   No wheezes GastrointestinaI: No nausea, vomiting, diarrhea, abdominal pain, fecal incontinence Genitourinary: No dysuria, urinary retention or frequency.  No nocturia. Musculoskeletal: No neck pain, back pain Integumentary: Notes rash x a few weeks left ankle.  Neurological: as above Psychiatric: Notes depression but no anxiety Endocrine: No palpitations, diaphoresis, change in appetite, change in weigh or increased thirst Hematologic/Lymphatic: No anemia, purpura, petechiae. Allergic/Immunologic: No itchy/runny eyes, nasal congestion, recent allergic reactions, rashes  ALLERGIES: No Known Allergies  HOME MEDICATIONS:  Current Outpatient Prescriptions:  .  citalopram (CELEXA) 20 MG tablet, TAKE 1 TABLET (20 MG TOTAL) BY MOUTH DAILY., Disp: 30 tablet, Rfl: 5 .  metFORMIN (GLUCOPHAGE) 1000 MG tablet, TAKE 1 TABLET BY MOUTH TWICE A DAY WITH MEALS, Disp: 60 tablet, Rfl: 3 .  prednisoLONE acetate (PRED FORTE) 1 % ophthalmic suspension, PLACE 1 DROP INTO BOTH EYES 4 TIMES A DAY, Disp: , Rfl: 3 .  valACYclovir (VALTREX) 500 MG tablet, 1 TABLET BY MOUTH TWICE A  DAY, Disp: , Rfl: 6 .  nortriptyline (PAMELOR) 25 MG capsule, Take 1 capsule (25 mg total) by mouth at bedtime., Disp: 30 capsule, Rfl: 3  PAST MEDICAL HISTORY: Past Medical History:  Diagnosis Date  . ADHD   . 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 Depression 10/31/2016  . Diabetes mellitus (HCC)    dx date 2004 at age 19  . Low back pain 09/01/2016  . Visual  disturbance 10/31/2016    PAST SURGICAL HISTORY: Past Surgical History:  Procedure Laterality Date  . WISDOM TOOTH EXTRACTION      FAMILY HISTORY: Family History  Problem Relation Age of Onset  . Depression Mother     SOCIAL HISTORY:  Social History   Social History  . Marital status: Single    Spouse name: N/A  . Number of children: 0  . Years of education: BA   Occupational History  . N/A    Social History Main Topics  . Smoking status: Never Smoker  . Smokeless tobacco: Never Used  . Alcohol use No  . Drug use: No  . Sexual activity: Not on file   Other Topics Concern  . Not on file   Social History Narrative   Denies caffeine use     PHYSICAL EXAM  Vitals:   04/16/17 1452  BP: 95/66  Pulse: 82  Resp: 16  Weight: 253 lb (114.8 kg)  Height: 5\' 9"  (1.753 m)    Body mass index is 37.36 kg/m.   General: The patient is well-developed and well-nourished and in no acute distress  Eyes:  Funduscopic exam shows normal optic discs.   Some corneal clouding.    Neurologic Exam  Mental status: The patient is alert and oriented x 3 at the time of the examination. The patient has apparent normal recent and remote memory, with an apparently normal attention span and concentration ability.   Speech is normal.  Cranial nerves: Extraocular movements are full. The pupils respond to light and accommodation equally. Facial strength and sensation is normal.  Trapezius and sternocleidomastoid strength is normal. No dysarthria is noted.  The tongue is midline, and the patient has symmetric elevation of the soft palate. No obvious hearing deficits are noted.   Gait and station: Station is normal.   Gait is normal. Tandem gait is normal. Romberg is negative.   Reflexes: Deep tendon reflexes are symmetric and normal bilaterally.        DIAGNOSTIC DATA (LABS, IMAGING, TESTING) - I reviewed patient records, labs, notes, testing and imaging myself where  available.  Lab Results  Component Value Date   WBC 10.1 12/07/2016   HGB 15.7 12/07/2016   HCT 44.2 12/07/2016   MCV 85 12/07/2016   PLT 213 12/07/2016      Component Value Date/Time   NA 139 12/07/2016 1409   K 4.5 12/07/2016 1409   CL 95 (L) 12/07/2016 1409   CO2 23 12/07/2016 1409   GLUCOSE 155 (H) 12/07/2016 1409   GLUCOSE 385 (H) 02/12/2012 2053   BUN 13 12/07/2016 1409   CREATININE 0.73 (L) 12/07/2016 1409   CREATININE 0.78 02/12/2012 2053   CALCIUM 9.8 12/07/2016 1409   PROT 7.4 12/07/2016 1409   ALBUMIN 4.7 12/07/2016 1409   AST 29 12/07/2016 1409   ALT 93 (H) 12/07/2016 1409   ALKPHOS 74 12/07/2016 1409   BILITOT 0.6 12/07/2016 1409   GFRNONAA 124 12/07/2016 1409   GFRAA 144 12/07/2016 1409   No results found for: CHOL,  HDL, LDLCALC, LDLDIRECT, TRIG, CHOLHDL Lab Results  Component Value Date   HGBA1C 6.1 12/07/2016       ASSESSMENT AND PLAN  Visual disturbance  Depression, unspecified depression type  Type 2 diabetes mellitus with complication, without long-term current use of insulin (HCC)   1.   His visual snow symptoms have not improved with lamotrigine, Diamox or verapamil. I will have him try nortriptyline 2.   We discussed that since he has some other visual issues and is thinking about going to [facility] that he follow through with this as another ophthalmologist may have more experience with his symptoms and be able to recommend another medication if he does not get a benefit from the imipramine.  3.   RTC prn new or worsening symptoms  Richard A. Epimenio Foot, MD, PhD 04/16/2017, 4:15 PM Certified in Neurology, Clinical Neurophysiology, Sleep Medicine, Pain Medicine and Neuroimaging  [facility] [address], [address] [place], [place] [address] [phone]

## 2017-04-19 ENCOUNTER — Other Ambulatory Visit: Payer: Self-pay | Admitting: Neurology

## 2017-05-13 ENCOUNTER — Other Ambulatory Visit: Payer: Self-pay | Admitting: Internal Medicine

## 2017-05-13 DIAGNOSIS — E119 Type 2 diabetes mellitus without complications: Secondary | ICD-10-CM

## 2017-06-13 ENCOUNTER — Ambulatory Visit (INDEPENDENT_AMBULATORY_CARE_PROVIDER_SITE_OTHER): Payer: Self-pay | Admitting: Internal Medicine

## 2017-06-13 ENCOUNTER — Encounter: Payer: Self-pay | Admitting: Internal Medicine

## 2017-06-13 VITALS — BP 108/81 | HR 120 | Temp 99.3°F | Wt 255.1 lb

## 2017-06-13 DIAGNOSIS — Z79899 Other long term (current) drug therapy: Secondary | ICD-10-CM

## 2017-06-13 DIAGNOSIS — F32A Depression, unspecified: Secondary | ICD-10-CM

## 2017-06-13 DIAGNOSIS — E118 Type 2 diabetes mellitus with unspecified complications: Secondary | ICD-10-CM

## 2017-06-13 DIAGNOSIS — Z7984 Long term (current) use of oral hypoglycemic drugs: Secondary | ICD-10-CM

## 2017-06-13 DIAGNOSIS — F329 Major depressive disorder, single episode, unspecified: Secondary | ICD-10-CM

## 2017-06-13 LAB — POCT GLYCOSYLATED HEMOGLOBIN (HGB A1C): Hemoglobin A1C: 7.9

## 2017-06-13 LAB — GLUCOSE, CAPILLARY: Glucose-Capillary: 361 mg/dL — ABNORMAL HIGH (ref 65–99)

## 2017-06-13 MED ORDER — GLIPIZIDE 5 MG PO TABS: 5.0000 mg | ORAL_TABLET | Freq: Every day | ORAL | 1 refills | Status: DC

## 2017-06-13 NOTE — Assessment & Plan Note (Addendum)
Uncontrolled, A1c 7.9. Currently on Metformin 1000 mg BID. Unfortunately he does not have insurance and cannot afford most medications. Denies symptoms of hyper- or hypoglycemia.   Plan: - Continue Metformin 1000 mg BID  - START Glipizide 5 mg QD - Encouraged increasing exercise and dietary changes

## 2017-06-13 NOTE — Progress Notes (Signed)
   CC: Continued management of DM and Depression   HPI:  [name] is a 31 y.o. with a PMHx significant for DM and Depression presenting for continued evaluation and management of his chronic medical problems.   Caryn Conley was diagnosed with DM at 31 y.o., he was previously controlled with 1000mg  Metformin BID. However his A1c today is 7.9. He tries try to avoid sweets and soda, drinking mainly water. Diet includes frozen pizza, and other frozen foods. Doesn't exercise daily, busy with work and the weather non-favorable. He has gained >20lbs over the past year. Checks feet daily, and reports numbness and tingling in the feet bilaterally. He is interested in further diabetes education. Denies symptoms consistent with hypo- or hyperglycemia.   Depression doing well. Currently on Citalopram 20 mg QD with no adverse effects. Reports his mood as happy. Denies changes in sleep habits, interests, increased guilt, decreased energy, inability or trouble concentrating, SI and/or HI.   Past Medical History:  Diagnosis Date  . ADHD   . 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 Depression 10/31/2016  . Diabetes mellitus (HCC)    dx date 2004 at age 31  . Low back pain 09/01/2016  . 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  Endorses weight gain  Physical Exam: Vitals:   06/13/17 1354  BP: 108/81  Pulse: (!) 120  Temp: 99.3 F (37.4 C)  TempSrc: Oral  SpO2: 99%  Weight: 255 lb 1.6 oz (115.7 kg)   Physical Exam  Constitutional: He is oriented to person, place, and time and well-developed, well-nourished, and in no distress.  HENT:  Head:  Normocephalic and atraumatic.  Eyes: Pupils are equal, round, and reactive to light. Conjunctivae are normal.  Cardiovascular: Normal rate, regular rhythm, normal heart sounds and intact distal pulses.   Pulmonary/Chest: Effort normal and breath sounds normal.  Abdominal: Soft. Bowel sounds are normal.  Musculoskeletal: Normal range of motion. He exhibits no edema.  Neurological: He is alert and oriented to person, place, and time.  Skin: He is diaphoretic.  Psychiatric: Memory and judgment normal.   Assessment & Plan:   See Encounters Tab for problem based charting.  Patient seen with Dr. Cleda DaubE. Hoffman

## 2017-06-13 NOTE — Patient Instructions (Addendum)
It was a pleasure to meet you:  - START taking glipizide 5 mg daily   - Continue your Metformin   - Continue to work on diet and exercise  Diabetes Mellitus and Food It is important for you to manage your blood sugar (glucose) level. Your blood glucose level can be greatly affected by what you eat. Eating healthier foods in the appropriate amounts throughout the day at about the same time each day will help you control your blood glucose level. It can also help slow or prevent worsening of your diabetes mellitus. Healthy eating may even help you improve the level of your blood pressure and reach or maintain a healthy weight. General recommendations for healthful eating and cooking habits include:  Eating meals and snacks regularly. Avoid going long periods of time without eating to lose weight.  Eating a diet that consists mainly of plant-based foods, such as fruits, vegetables, nuts, legumes, and whole grains.  Using low-heat cooking methods, such as baking, instead of high-heat cooking methods, such as deep frying.  Work with your dietitian to make sure you understand how to use the Nutrition Facts information on food labels. How can food affect me? Carbohydrates Carbohydrates affect your blood glucose level more than any other type of food. Your dietitian will help you determine how many carbohydrates to eat at each meal and teach you how to count carbohydrates. Counting carbohydrates is important to keep your blood glucose at a healthy level, especially if you are using insulin or taking certain medicines for diabetes mellitus. Alcohol Alcohol can cause sudden decreases in blood glucose (hypoglycemia), especially if you use insulin or take certain medicines for diabetes mellitus. Hypoglycemia can be a life-threatening condition. Symptoms of hypoglycemia (sleepiness, dizziness, and disorientation) are similar to symptoms of having too much alcohol. If your health care provider has given  you approval to drink alcohol, do so in moderation and use the following guidelines:  Women should not have more than one drink per day, and men should not have more than two drinks per day. One drink is equal to: ? 12 oz of beer. ? 5 oz of wine. ? 1 oz of hard liquor.  Do not drink on an empty stomach.  Keep yourself hydrated. Have water, diet soda, or unsweetened iced tea.  Regular soda, juice, and other mixers might contain a lot of carbohydrates and should be counted.  What foods are not recommended? As you make food choices, it is important to remember that all foods are not the same. Some foods have fewer nutrients per serving than other foods, even though they might have the same number of calories or carbohydrates. It is difficult to get your body what it needs when you eat foods with fewer nutrients. Examples of foods that you should avoid that are high in calories and carbohydrates but low in nutrients include:  Trans fats (most processed foods list trans fats on the Nutrition Facts label).  Regular soda.  Juice.  Candy.  Sweets, such as cake, pie, doughnuts, and cookies.  Fried foods.  What foods can I eat? Eat nutrient-rich foods, which will nourish your body and keep you healthy. The food you should eat also will depend on several factors, including:  The calories you need.  The medicines you take.  Your weight.  Your blood glucose level.  Your blood pressure level.  Your cholesterol level.  You should eat a variety of foods, including:  Protein. ? Lean cuts of meat. ? Proteins  low in saturated fats, such as fish, egg whites, and beans. Avoid processed meats.  Fruits and vegetables. ? Fruits and vegetables that may help control blood glucose levels, such as apples, mangoes, and yams.  Dairy products. ? Choose fat-free or low-fat dairy products, such as milk, yogurt, and cheese.  Grains, bread, pasta, and rice. ? Choose whole grain products, such  as multigrain bread, whole oats, and brown rice. These foods may help control blood pressure.  Fats. ? Foods containing healthful fats, such as nuts, avocado, olive oil, canola oil, and fish.  Does everyone with diabetes mellitus have the same meal plan? Because every person with diabetes mellitus is different, there is not one meal plan that works for everyone. It is very important that you meet with a dietitian who will help you create a meal plan that is just right for you. This information is not intended to replace advice given to you by your health care provider. Make sure you discuss any questions you have with your health care provider. Document Released: 07/19/2005 Document Revised: 03/29/2016 Document Reviewed: 09/18/2013 Elsevier Interactive Patient Education  2017 [employer].

## 2017-06-13 NOTE — Assessment & Plan Note (Signed)
Well controlled SIGECAPS review negative.   Plan: - Continue Citalopram 20 mg QD

## 2017-06-17 NOTE — Progress Notes (Signed)
Internal Medicine Clinic Attending  I saw and evaluated the patient.  I personally confirmed the key portions of the history and exam documented by Dr. Helberg and I reviewed pertinent patient test results.  The assessment, diagnosis, and plan were formulated together and I agree with the documentation in the resident's note. 

## 2017-08-17 ENCOUNTER — Other Ambulatory Visit: Payer: Self-pay | Admitting: Neurology

## 2017-09-05 ENCOUNTER — Encounter (INDEPENDENT_AMBULATORY_CARE_PROVIDER_SITE_OTHER): Payer: Self-pay

## 2017-09-05 ENCOUNTER — Encounter: Payer: Self-pay | Admitting: Internal Medicine

## 2017-09-05 ENCOUNTER — Ambulatory Visit (INDEPENDENT_AMBULATORY_CARE_PROVIDER_SITE_OTHER): Payer: Self-pay | Admitting: Internal Medicine

## 2017-09-05 VITALS — BP 106/71 | HR 79 | Temp 98.0°F | Ht 69.0 in | Wt 262.3 lb

## 2017-09-05 DIAGNOSIS — Z6838 Body mass index (BMI) 38.0-38.9, adult: Secondary | ICD-10-CM

## 2017-09-05 DIAGNOSIS — E669 Obesity, unspecified: Secondary | ICD-10-CM

## 2017-09-05 DIAGNOSIS — E118 Type 2 diabetes mellitus with unspecified complications: Secondary | ICD-10-CM

## 2017-09-05 DIAGNOSIS — E119 Type 2 diabetes mellitus without complications: Secondary | ICD-10-CM

## 2017-09-05 DIAGNOSIS — Z7984 Long term (current) use of oral hypoglycemic drugs: Secondary | ICD-10-CM

## 2017-09-05 DIAGNOSIS — Z9114 Patient's other noncompliance with medication regimen: Secondary | ICD-10-CM

## 2017-09-05 LAB — GLUCOSE, CAPILLARY: Glucose-Capillary: 195 mg/dL — ABNORMAL HIGH (ref 65–99)

## 2017-09-05 LAB — POCT GLYCOSYLATED HEMOGLOBIN (HGB A1C): Hemoglobin A1C: 6.4

## 2017-09-05 NOTE — Progress Notes (Signed)
   CC: Follow-up of his diabetes mellitus   HPI:  Carl Conley is a 31 y.o. male who presented for continued evaluation and management of his diabetes mellitus. For a detailed evaluation, assessment, and plan please refer to the problem based charting.   Past Medical History:  Diagnosis Date  . ADHD   . 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 Depression 10/31/2016  . Diabetes mellitus (HCC)    dx date 2004 at age 31  . Low back pain 09/01/2016  . Visual disturbance 10/31/2016   Review of Systems: General: - HA, fatigue CV: - Chest pain, palpitations  GI: - Constipation, diarrhea  Physical Exam: Vitals:   09/05/17 1532  BP: 106/71  Pulse: 79  Temp: 98 F (36.7 C)  TempSrc: Oral  SpO2: 100%  Weight: 262 lb 4.8 oz (119 kg)  Height: 5\' 9"  (1.753 m)   General: Obese male resting comfortably  Pulm: Good air movement with no wheezing or crackles  CV: RRR, no murmurs, no rubs  Abdomen: Active bowel sounds, soft, no tenderness to palpation  Extremities: No LE edema, warm and dry   Assessment & Plan:   See Encounters Tab for problem based charting.  Patient seen with Dr. Oswaldo DoneVincent

## 2017-09-05 NOTE — Patient Instructions (Addendum)
It was great to see you today.   - Great job on your diabetes control   - Please try to take your Glipizide on a more consistent basis   - Work on weight loss: Current weight 262lbs goal in 3 months 255lbs  - Start to aim for 30 minutes of exercise per day
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
## 2017-09-06 NOTE — Progress Notes (Signed)
Internal Medicine Clinic Attending  I saw and evaluated the patient.  I personally confirmed the key portions of the history and exam documented by Dr. Helberg and I reviewed pertinent patient test results.  The assessment, diagnosis, and plan were formulated together and I agree with the documentation in the resident's note. 

## 2017-09-19 ENCOUNTER — Other Ambulatory Visit: Payer: Self-pay | Admitting: Internal Medicine

## 2017-09-19 DIAGNOSIS — E119 Type 2 diabetes mellitus without complications: Secondary | ICD-10-CM

## 2017-09-20 NOTE — Telephone Encounter (Signed)
Next appt scheduled  12/12/17 with PCP. 

## 2017-10-18 ENCOUNTER — Other Ambulatory Visit: Payer: Self-pay | Admitting: Neurology

## 2017-11-12 ENCOUNTER — Other Ambulatory Visit: Payer: Self-pay | Admitting: Internal Medicine

## 2017-11-12 DIAGNOSIS — E118 Type 2 diabetes mellitus with unspecified complications: Secondary | ICD-10-CM

## 2017-11-24 ENCOUNTER — Other Ambulatory Visit: Payer: Self-pay | Admitting: Neurology

## 2017-12-12 ENCOUNTER — Other Ambulatory Visit: Payer: Self-pay

## 2017-12-12 ENCOUNTER — Ambulatory Visit: Payer: Self-pay | Admitting: Internal Medicine

## 2017-12-12 ENCOUNTER — Encounter: Payer: Self-pay | Admitting: Internal Medicine

## 2017-12-12 VITALS — BP 111/72 | HR 92 | Temp 98.7°F | Ht 69.0 in | Wt 260.7 lb

## 2017-12-12 DIAGNOSIS — E118 Type 2 diabetes mellitus with unspecified complications: Secondary | ICD-10-CM

## 2017-12-12 DIAGNOSIS — E1140 Type 2 diabetes mellitus with diabetic neuropathy, unspecified: Secondary | ICD-10-CM | POA: Insufficient documentation

## 2017-12-12 DIAGNOSIS — E1149 Type 2 diabetes mellitus with other diabetic neurological complication: Secondary | ICD-10-CM

## 2017-12-12 LAB — GLUCOSE, CAPILLARY: GLUCOSE-CAPILLARY: 152 mg/dL — AB (ref 65–99)

## 2017-12-12 LAB — POCT GLYCOSYLATED HEMOGLOBIN (HGB A1C): Hemoglobin A1C: 5.9

## 2017-12-12 NOTE — Assessment & Plan Note (Addendum)
Mr. Carl Conley is presenting for continued evaluation and management of his DM. He is currently on Metformin 1000 mg BID and Glipizide 5 mg QD. 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 This has been a significant stressor for him so he has not been exercising or eating a well balanced diet.   His A1c is 5.9 today. Denies symptoms of hypoglycemia.   Plan: - Encouraged weight loss and exercise  - Continue Metformin 1000 mg BID and Glipizide 5 mg QD

## 2017-12-12 NOTE — Progress Notes (Signed)
   CC: Follow-up for his DM  HPI:  [name] is a 32 y.o. with well controlled DM who presented for continued evaluation and management of his chronic illnesses. Please refer to problem based charting for a detailed evaluation and management plan.   Past Medical History:  Diagnosis Date  . ADHD   . 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 Depression 10/31/2016  . Diabetes mellitus (HCC)    dx date 2004 at age 32  . Low back pain 09/01/2016  . Visual disturbance 10/31/2016   Review of Systems:   12 point review of system preformed. All negative aside from those mentioned in the HPI.   Physical Exam: Vitals:   12/12/17 1531  BP: 111/72  Pulse: 92  Temp: 98.7 F (37.1 C)  TempSrc: Oral  SpO2: 98%  Weight: 260 lb 11.2 oz (118.3 kg)  Height: 5\' 9"  (1.753 m)   General: Well nourished male in no acute distress Pulm: Good air movement with no wheezing or crackles  CV: RRR, no murmurs, no rubs  Abdomen: Active bowel sounds, soft, non-distended, no tenderness to palpation  Extremities: No LE edema, warm and dry  Feet: Erythematous. 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 On the right foot he has a callus on the plantar surface of the 2nd digit. He does not appear to have a Charcot deformity at this point.   Assessment & Plan:   See Encounters Tab for problem based charting.  Patient discussed with Dr. Oswaldo DoneVincent
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
## 2017-12-12 NOTE — Patient Instructions (Addendum)
It was a pleasure to see you again. Continue to take all your medications as prescribed. I have put in a referral for the foot doctor, they should be contacting you within the next week. Please meet [facility] Carl [facility]

## 2017-12-13 NOTE — Progress Notes (Signed)
Internal Medicine Clinic Attending  Case discussed with Dr. Helberg at the time of the visit.  We reviewed the resident's history and exam and pertinent patient test results.  I agree with the assessment, diagnosis, and plan of care documented in the resident's note.    

## 2017-12-20 ENCOUNTER — Ambulatory Visit: Payer: Self-pay

## 2017-12-26 ENCOUNTER — Ambulatory Visit: Payer: Self-pay

## 2018-01-02 ENCOUNTER — Ambulatory Visit: Payer: Self-pay

## 2018-01-25 ENCOUNTER — Other Ambulatory Visit: Payer: Self-pay | Admitting: Internal Medicine

## 2018-01-25 ENCOUNTER — Other Ambulatory Visit: Payer: Self-pay | Admitting: Neurology

## 2018-01-25 DIAGNOSIS — E119 Type 2 diabetes mellitus without complications: Secondary | ICD-10-CM

## 2018-03-03 ENCOUNTER — Encounter: Payer: Self-pay | Admitting: *Deleted

## 2018-03-13 ENCOUNTER — Encounter: Payer: Self-pay | Admitting: Internal Medicine

## 2018-03-14 ENCOUNTER — Other Ambulatory Visit: Payer: Self-pay

## 2018-03-14 ENCOUNTER — Encounter: Payer: Self-pay | Admitting: Internal Medicine

## 2018-03-14 ENCOUNTER — Ambulatory Visit (INDEPENDENT_AMBULATORY_CARE_PROVIDER_SITE_OTHER): Payer: Self-pay | Admitting: Internal Medicine

## 2018-03-14 ENCOUNTER — Other Ambulatory Visit: Payer: Self-pay | Admitting: Internal Medicine

## 2018-03-14 VITALS — BP 107/69 | HR 95 | Temp 99.6°F | Ht 69.0 in | Wt 265.9 lb

## 2018-03-14 DIAGNOSIS — E1140 Type 2 diabetes mellitus with diabetic neuropathy, unspecified: Secondary | ICD-10-CM

## 2018-03-14 DIAGNOSIS — E11621 Type 2 diabetes mellitus with foot ulcer: Secondary | ICD-10-CM

## 2018-03-14 DIAGNOSIS — Z79899 Other long term (current) drug therapy: Secondary | ICD-10-CM

## 2018-03-14 DIAGNOSIS — E118 Type 2 diabetes mellitus with unspecified complications: Secondary | ICD-10-CM

## 2018-03-14 DIAGNOSIS — L98499 Non-pressure chronic ulcer of skin of other sites with unspecified severity: Secondary | ICD-10-CM

## 2018-03-14 DIAGNOSIS — L97529 Non-pressure chronic ulcer of other part of left foot with unspecified severity: Secondary | ICD-10-CM

## 2018-03-14 LAB — POCT GLYCOSYLATED HEMOGLOBIN (HGB A1C): HEMOGLOBIN A1C: 6.4

## 2018-03-14 LAB — GLUCOSE, CAPILLARY: GLUCOSE-CAPILLARY: 179 mg/dL — AB (ref 65–99)

## 2018-03-14 NOTE — Progress Notes (Signed)
   CC: Diabetes Mellitus   HPI:  [name] is a 32 y.o. male who presented to the clinic for continued evaluation and management of his chronic medical illnesses. For ad detailed evaluation and management plan please refer to problem based charting below.   Past Medical History:  Diagnosis Date  . ADHD   . 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 Depression 10/31/2016  . Diabetes mellitus (HCC)    dx date 2004 at age 87  . Low back pain 09/01/2016  . Visual disturbance 10/31/2016   Review of Systems:  Denies: Fevers, chills, N/V, abdominal pain   Physical Exam: Vitals:   03/14/18 1553  BP: 107/69  Pulse: 95  Temp: 99.6 F (37.6 C)  TempSrc: Oral  SpO2: 99%  Weight: 265 lb 14.4 oz (120.6 kg)  Height:  (1.753 m)   General: Well nourished male in no acute distress Pulm: Good air movement with no wheezing or crackles  CV: RRR, no murmurs, no rubs  Extremities: Pulses palpable in all extremities, no LE edema   Media Information   Document Information  Photos  Left foot  03/14/2018 16:11  Attached To:  Carl Conley   Source Information  Lorenso Courier, MD  [facility]   Assessment & Plan:   See Encounters Tab for problem based charting.  Patient discussed with Dr. Heide Spark

## 2018-03-14 NOTE — Patient Instructions (Addendum)
Thank you for allowing [place] to provide your care. I will call Dr. Gabriel Rung office on Monday and see if I can get you an appointment sooner. Keep up the great work with your diabetes.

## 2018-03-20 ENCOUNTER — Telehealth: Payer: Self-pay | Admitting: Internal Medicine

## 2018-03-20 ENCOUNTER — Encounter: Payer: Self-pay | Admitting: Internal Medicine

## 2018-03-20 DIAGNOSIS — L98499 Non-pressure chronic ulcer of skin of other sites with unspecified severity: Secondary | ICD-10-CM

## 2018-03-20 NOTE — Telephone Encounter (Signed)
Called [facility] today to see if the patient could get into podiatry sooner than next month. I was informed that this is not an option. I called [facility] as well and was informed that they do not accept the [facility].   I have placed another referral to hopefully get the patient seen sooner to prevent complications.

## 2018-03-20 NOTE — Progress Notes (Signed)
Internal Medicine Clinic Attending  Case discussed with Dr. Helberg at the time of the visit.  We reviewed the resident's history and exam and pertinent patient test results.  I agree with the assessment, diagnosis, and plan of care documented in the resident's note.    

## 2018-03-20 NOTE — Assessment & Plan Note (Signed)
Patient continues to have well controlled DM on metformin and glipizide. He is not experiencing any adverse effects with the medications. We will continue his current medication and try to get him into podiatry.

## 2018-03-20 NOTE — Assessment & Plan Note (Addendum)
Patient presented for continues evaluation and management of what looks like a neuropathic ulcer on the second digit of his left foot. He is scheduled to see Dr. Ardelle Anton at [facility] early next month. He denies fevers, chills, N/V, purulent discharge, pain around the ulcer. We discussed attempting to get him in sooner. I am trying to call their office.

## 2018-04-07 ENCOUNTER — Ambulatory Visit: Payer: Self-pay | Attending: Family Medicine | Admitting: Podiatry

## 2018-04-07 DIAGNOSIS — M869 Osteomyelitis, unspecified: Secondary | ICD-10-CM

## 2018-04-07 DIAGNOSIS — E118 Type 2 diabetes mellitus with unspecified complications: Secondary | ICD-10-CM

## 2018-04-07 DIAGNOSIS — L98499 Non-pressure chronic ulcer of skin of other sites with unspecified severity: Secondary | ICD-10-CM

## 2018-04-07 MED ORDER — AMOXICILLIN-POT CLAVULANATE 875-125 MG PO TABS: 1.0000 | ORAL_TABLET | Freq: Two times a day (BID) | ORAL | 0 refills | Status: DC

## 2018-04-07 MED ORDER — SILVER SULFADIAZINE 1 % EX CREA: 1.0000 "application " | TOPICAL_CREAM | Freq: Every day | CUTANEOUS | 0 refills | Status: DC
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
## 2018-04-25 ENCOUNTER — Ambulatory Visit (HOSPITAL_COMMUNITY)
Admission: RE | Admit: 2018-04-25 | Discharge: 2018-04-25 | Disposition: A | Discharge status: Home / Self Care | Payer: Self-pay | Source: Ambulatory Visit | Attending: Podiatry | Admitting: Podiatry

## 2018-04-25 DIAGNOSIS — M869 Osteomyelitis, unspecified: Secondary | ICD-10-CM

## 2018-05-15 ENCOUNTER — Ambulatory Visit (INDEPENDENT_AMBULATORY_CARE_PROVIDER_SITE_OTHER): Payer: Self-pay | Admitting: Podiatry

## 2018-05-15 DIAGNOSIS — L97501 Non-pressure chronic ulcer of other part of unspecified foot limited to breakdown of skin: Secondary | ICD-10-CM

## 2018-05-18 NOTE — Progress Notes (Signed)
Subjective: 32 year old male presents the office today for follow-up evaluation of wounds to both of his feet.  He states he is in pain Silvadene on the wounds daily and this is been getting much better.  Denies any drainage or pus coming from the area denies any swelling or redness.  He has no new concerns. Denies any systemic complaints such as fevers, chills, nausea, vomiting. No acute changes since last appointment, and no other complaints at this time.   Objective: AAO x3, NAD DP/PT pulses palpable bilaterally, CRT less than 3 seconds On the right plantar hallux is a hyperkeratotic lesion.  Upon debridement there is no definitive underlying ulceration, drainage or any signs of infection noted today. To the plantar distal aspect the left hallux is a superficial granular wound present.  There is no probing, undermining or tunneling.  There is no surrounding erythema, ascending sialitis.  There is no fluctuation or crepitation or any malodor.  Overall the wounds are doing much better. No other open lesions or pre-ulcerative lesions.  No pain with calf compression, swelling, warmth, erythema       Assessment: Left second toe ulceration with improvement  Plan: -All treatment options discussed with the patient including all alternatives, risks, complications.  -I debrided the hyperkeratotic lesion on the right foot without any complications or bleeding.  Left side appears to be doing much better as well.  Continue Silvadene dressing changes to the area daily.  Offloading at all times. Monitor for any clinical signs or symptoms of infection and directed to call the office immediately should any occur or go to the ER. -For the next 2 to 3 weeks if the wound is not completely healed or sooner if any issues are to arise. -Patient encouraged to call the office with any questions, concerns, change in symptoms.   Vivi BarrackMatthew R Briggette Conley DPM

## 2018-06-05 ENCOUNTER — Ambulatory Visit: Payer: Self-pay | Admitting: Podiatry

## 2018-06-19 ENCOUNTER — Encounter: Payer: Self-pay | Admitting: Internal Medicine

## 2018-06-19 ENCOUNTER — Other Ambulatory Visit: Payer: Self-pay

## 2018-06-19 ENCOUNTER — Ambulatory Visit (INDEPENDENT_AMBULATORY_CARE_PROVIDER_SITE_OTHER): Payer: Self-pay | Admitting: Internal Medicine

## 2018-06-19 VITALS — BP 112/75 | HR 99 | Temp 98.8°F | Ht 69.0 in | Wt 268.2 lb

## 2018-06-19 DIAGNOSIS — E11621 Type 2 diabetes mellitus with foot ulcer: Secondary | ICD-10-CM

## 2018-06-19 DIAGNOSIS — L97529 Non-pressure chronic ulcer of other part of left foot with unspecified severity: Secondary | ICD-10-CM

## 2018-06-19 DIAGNOSIS — Z7984 Long term (current) use of oral hypoglycemic drugs: Secondary | ICD-10-CM

## 2018-06-19 DIAGNOSIS — E118 Type 2 diabetes mellitus with unspecified complications: Secondary | ICD-10-CM

## 2018-06-19 DIAGNOSIS — Z23 Encounter for immunization: Secondary | ICD-10-CM | POA: Insufficient documentation

## 2018-06-19 DIAGNOSIS — L98499 Non-pressure chronic ulcer of skin of other sites with unspecified severity: Secondary | ICD-10-CM

## 2018-06-19 LAB — POCT GLYCOSYLATED HEMOGLOBIN (HGB A1C): HEMOGLOBIN A1C: 6.8 % — AB (ref 4.0–5.6)

## 2018-06-19 LAB — GLUCOSE, CAPILLARY: GLUCOSE-CAPILLARY: 186 mg/dL — AB (ref 70–99)

## 2018-06-19 NOTE — Assessment & Plan Note (Addendum)
Patient doing well on Metformin 1000 mg BID and Glipizide 5 mg QD. He is not experiencing any adverse effects. He has not changed his diet and continues to gain weight. He is not physically active. A1c today is 6.8 which is still at goal but upward trend noted. Discussed the need to initiate lifestyle modification to prevent medication adjustments.   Plan: - Continue Metformin 1000 mg BID - Continue Glipizide 5 mg QD  - Lifestyle modifications.  - Check urine microalbumin - Pneumococcal polysaccharide 23  ADDENDUM: Microalbumin WNL. No ACE at this point.

## 2018-06-19 NOTE — Progress Notes (Signed)
   CC: Diabetes  HPI:  Mr.Cordel Quita SkyeS Michaelson is a 32 y.o. male who presented to the clinic for continued evaluation and management of his chronic medical illnesses. For a detailed assessment and plan please refer to problem based charting below.   Past Medical History:  Diagnosis Date  . ADHD   . 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 Depression 10/31/2016  . Diabetes mellitus (HCC)    dx date 2004 at age 32  . Low back pain 09/01/2016  . Visual disturbance 10/31/2016   Review of Systems:   Denies SOB, cough, CP, palpitations  Physical Exam: Vitals:   06/19/18 1525  BP: 112/75  Pulse: 99  Temp: 98.8 F (37.1 C)  TempSrc: Oral  SpO2: 98%  Weight: 268 lb 3.2 oz (121.7 kg)  Height: 5\' 9"  (1.753 m)   General: Obese male in no acute distress Pulm: Good air movement with no wheezing or crackles  CV: RRR, no murmurs, no rubs  Extremities: No LE edema  Skin: Ulcer on the left foot, improved from prior     Assessment & Plan:   See Encounters Tab for problem based charting.  Patient discussed with Dr. Rogelia BogaButcher

## 2018-06-19 NOTE — Assessment & Plan Note (Signed)
Ulcer on the left foot is improved compared to prior. Saw podiatry approximately 1 month ago. Continuing wound care. X-ray reviewed without foreign body or signs of osteomyelitis. Patient will continue local wound care and follow up with podiatry. Discussed red flag symptoms including purulence, erythema, swelling, pain, fevers, chills.

## 2018-06-19 NOTE — Patient Instructions (Signed)
Thank you for allowing us to provide your care. Today we are getting some lab work and I will call you with the results. Continue to work to become more active and lose weight. I will see you back in 3 months

## 2018-06-20 ENCOUNTER — Encounter: Payer: Self-pay | Admitting: Internal Medicine

## 2018-06-20 LAB — MICROALBUMIN / CREATININE URINE RATIO
Creatinine, Urine: 128 mg/dL
MICROALB/CREAT RATIO: 3.9 mg/g{creat} (ref 0.0–30.0)
MICROALBUM., U, RANDOM: 5 ug/mL

## 2018-06-23 NOTE — Progress Notes (Signed)
Internal Medicine Clinic Attending  Case discussed with Dr. Helberg at the time of the visit.  We reviewed the resident's history and exam and pertinent patient test results.  I agree with the assessment, diagnosis, and plan of care documented in the resident's note.    

## 2018-08-30 IMAGING — DX DG FOOT 2V*L*
4 series · 4 of 4 positions shown · non-contrast
Comparison: None.

CLINICAL DATA: Pain and swelling.

EXAM:
LEFT FOOT - 2 VIEW

[foot ap]
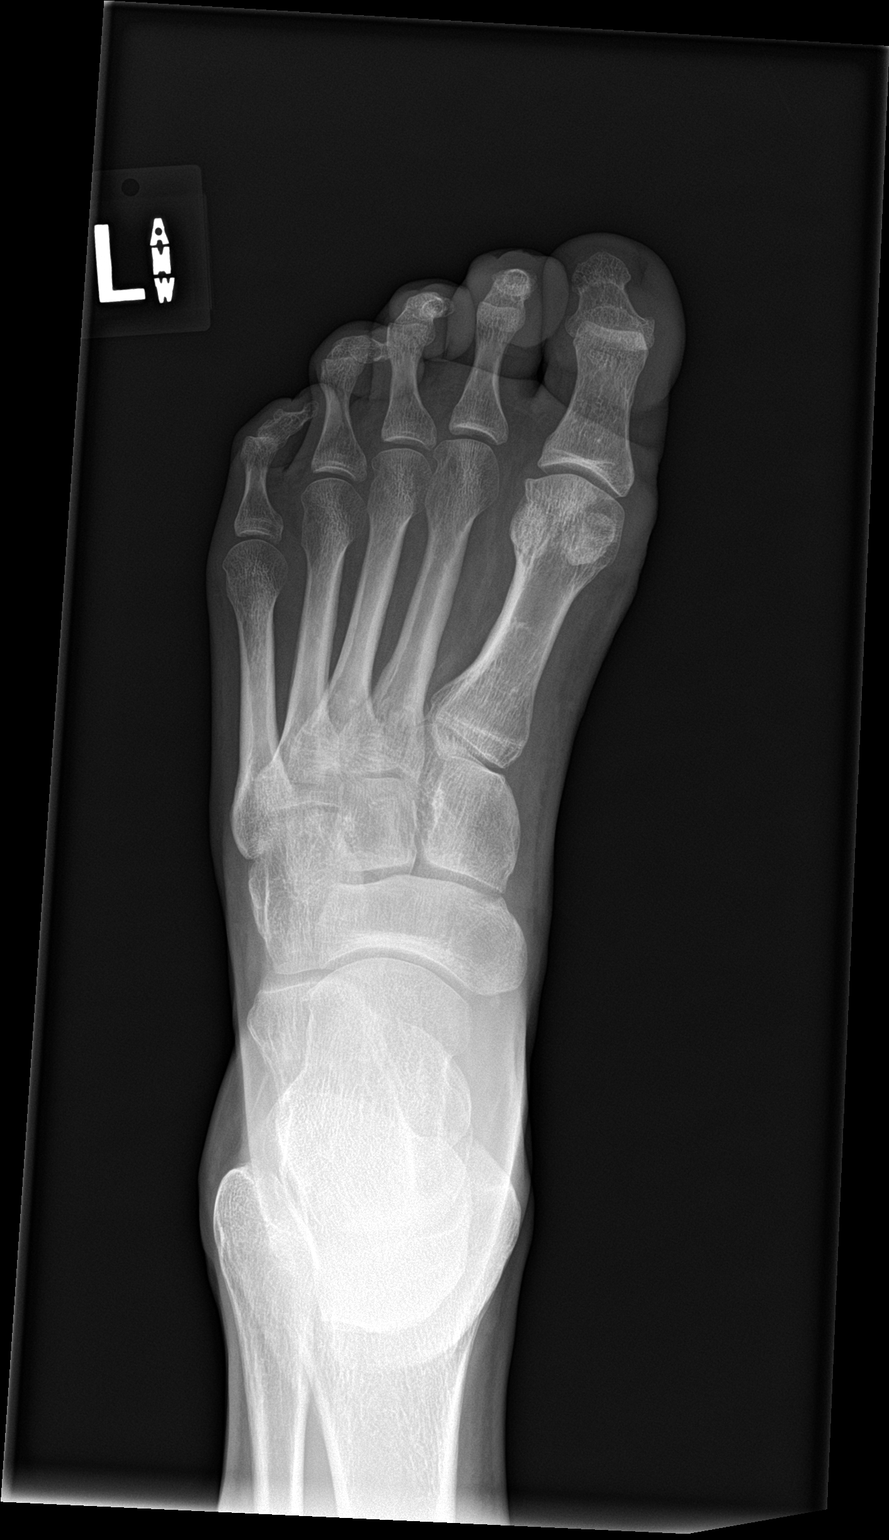

[foot lat]
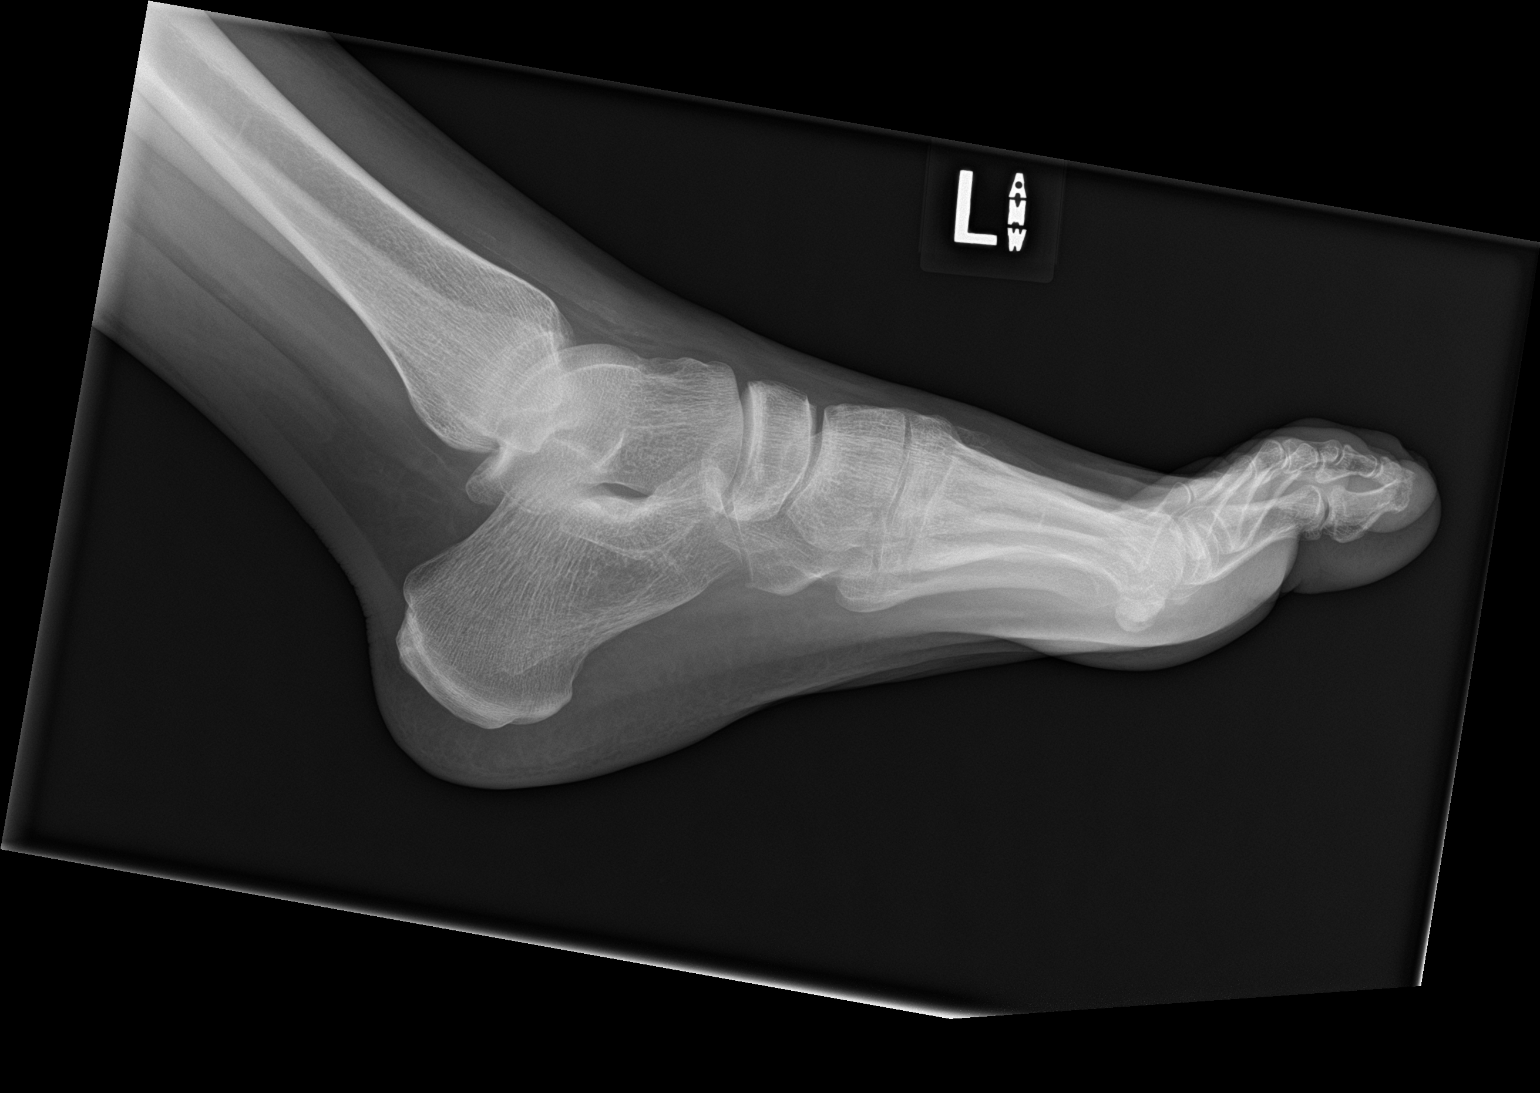

[toe ap]
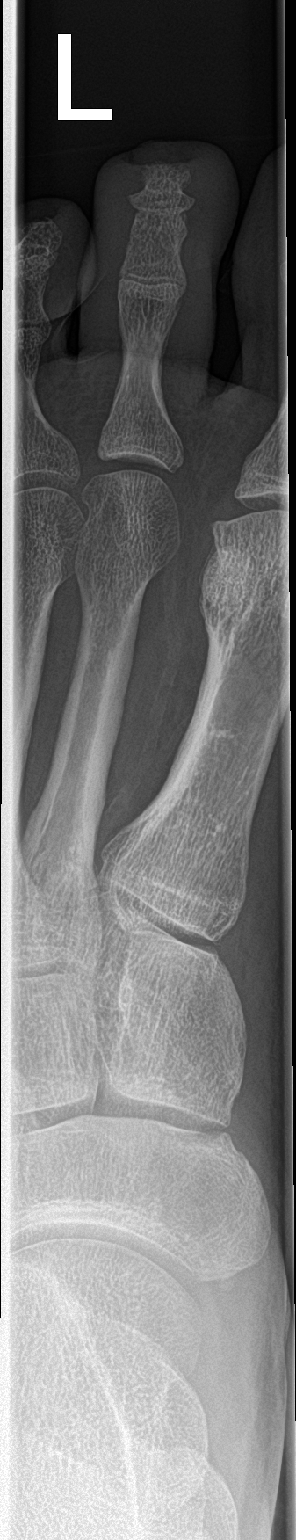

[toe lat]
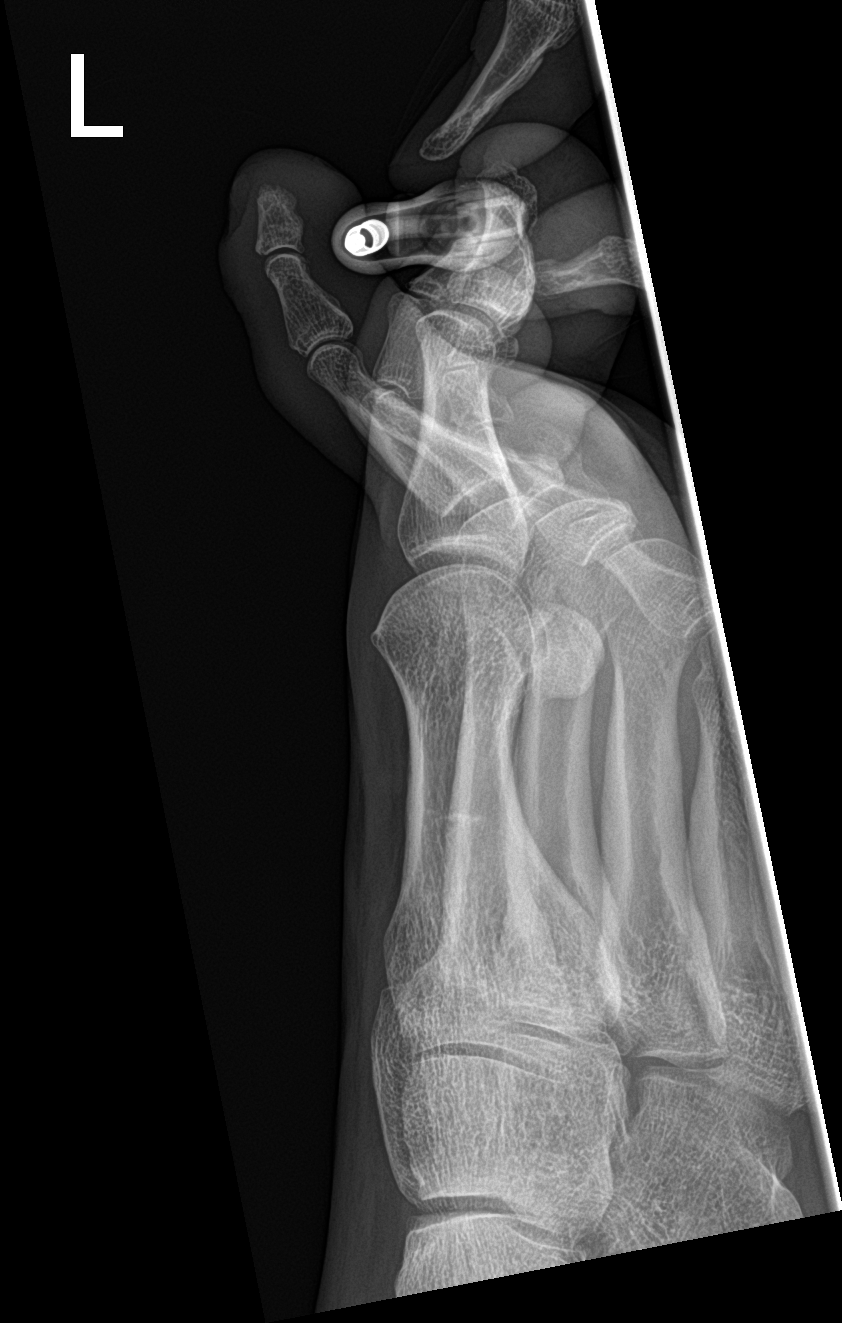

[4 of 4 positions shown; findings below may reference images not displayed]

FINDINGS: The joint spaces are maintained. No acute bony findings or
destructive bony changes.
IMPRESSION: No significant bony findings.

## 2018-09-09 ENCOUNTER — Other Ambulatory Visit: Payer: Self-pay | Admitting: Internal Medicine

## 2018-09-09 DIAGNOSIS — E118 Type 2 diabetes mellitus with unspecified complications: Secondary | ICD-10-CM

## 2018-11-19 ENCOUNTER — Other Ambulatory Visit: Payer: Self-pay | Admitting: Internal Medicine

## 2018-11-19 DIAGNOSIS — E119 Type 2 diabetes mellitus without complications: Secondary | ICD-10-CM

## 2018-11-21 ENCOUNTER — Telehealth: Payer: Self-pay | Admitting: Dietician

## 2018-11-28 NOTE — Telephone Encounter (Signed)
Left message that we can discuss Continuous glucose monitoring when he sees Dr. Caron Presume in February if he prefers.

## 2018-11-28 NOTE — Telephone Encounter (Signed)
Patient requests call back later

## 2018-12-01 ENCOUNTER — Telehealth: Payer: Self-pay | Admitting: Dietician

## 2018-12-01 NOTE — Telephone Encounter (Signed)
Spoke to Carl Conley concerning he is a good candidate to receive placement of a continuous glucose monitor. He is interested in talking further following his appointment with Dr. Caron Presume on 12/18/2018. Appointment with Lupita Leash has been scheduled.   Carl Conley 12/01/2018 11:26 AM.

## 2018-12-01 NOTE — Telephone Encounter (Signed)
I reviewed and approve this note and the plan.  Norm Parcel, RD 12/01/2018 11:33 AM.

## 2018-12-18 ENCOUNTER — Ambulatory Visit: Payer: Self-pay | Admitting: Dietician

## 2018-12-18 ENCOUNTER — Encounter: Payer: Self-pay | Admitting: Internal Medicine

## 2018-12-18 ENCOUNTER — Other Ambulatory Visit: Payer: Self-pay

## 2018-12-18 ENCOUNTER — Ambulatory Visit: Payer: Self-pay | Admitting: Internal Medicine

## 2018-12-18 ENCOUNTER — Encounter: Payer: Self-pay | Admitting: Dietician

## 2018-12-18 VITALS — BP 102/77 | HR 94 | Temp 99.1°F | Ht 69.0 in | Wt 272.6 lb

## 2018-12-18 DIAGNOSIS — E118 Type 2 diabetes mellitus with unspecified complications: Secondary | ICD-10-CM

## 2018-12-18 DIAGNOSIS — Z7984 Long term (current) use of oral hypoglycemic drugs: Secondary | ICD-10-CM

## 2018-12-18 DIAGNOSIS — E1159 Type 2 diabetes mellitus with other circulatory complications: Secondary | ICD-10-CM

## 2018-12-18 DIAGNOSIS — E119 Type 2 diabetes mellitus without complications: Secondary | ICD-10-CM

## 2018-12-18 LAB — POCT GLYCOSYLATED HEMOGLOBIN (HGB A1C): Hemoglobin A1C: 7.4 % — AB (ref 4.0–5.6)

## 2018-12-18 LAB — GLUCOSE, CAPILLARY: GLUCOSE-CAPILLARY: 261 mg/dL — AB (ref 70–99)

## 2018-12-18 MED ORDER — GLIPIZIDE 10 MG PO TABS: 10.0000 mg | ORAL_TABLET | Freq: Every day | ORAL | 1 refills | Status: DC

## 2018-12-18 NOTE — Progress Notes (Signed)
Met briefly with Mr. Vandrunen today to explain what Continuous glucose monitoring is and why we are recommending it for him. I also educated him on the benefits and costs involved. He decided to not have one placed today as the cost is prohibitive. We discussed options that could make it more affordable such as [facility] financial assistance. He verbalized understanding and I encouraged him to reach out to me for any of his diabetes related questions or concerns.  Debera Lat, RD 12/18/2018 4:40 PM.

## 2018-12-18 NOTE — Progress Notes (Signed)
   CC: F/U DM  HPI:  Carl Conley is a 33 y.o. male with PMHx listed below presenting for f/u DM. Please see the A&P for the status of the patient's chronic medical problems.  Past Medical History:  Diagnosis Date  . ADHD   . 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 Depression 10/31/2016  . Diabetes mellitus (HCC)    dx date 2004 at age 26  . Low back pain 09/01/2016  . Visual disturbance 10/31/2016   Review of Systems:  Performed and all others negative.  Physical Exam: Vitals:   12/18/18 1524  BP: 102/77  Pulse: 94  Temp: 99.1 F (37.3 C)  TempSrc: Oral  SpO2: 98%  Weight: 272 lb 9.6 oz (123.7 kg)  Height: 5\' 9"  (1.753 m)   General: Obese male in no acute distress Pulm: Good air movement with no wheezing or crackles  CV: RRR, no murmurs, no rubs   Assessment & Plan:   See Encounters Tab for problem based charting.  Patient discussed with Dr. [name]

## 2018-12-18 NOTE — Patient Instructions (Signed)
Thank you for allowing me to provide your care. Today we are increasing your Glipizide to 10 mg. Please increase your exercise.   Please come back in 3 months for an A1c check and I will see you back in 6 months.

## 2018-12-19 ENCOUNTER — Encounter: Payer: Self-pay | Admitting: Internal Medicine

## 2018-12-19 NOTE — Progress Notes (Signed)
Internal Medicine Clinic Attending  Case discussed with Dr. Helberg at the time of the visit.  We reviewed the resident's history and exam and pertinent patient test results.  I agree with the assessment, diagnosis, and plan of care documented in the resident's note.    

## 2018-12-19 NOTE — Assessment & Plan Note (Signed)
HPI: Patient with uncontrolled diabetes. Repeat A1c 7.4 today. On metformin 1000 mg BID and glipizide 5 mg q.d. He continues to gain weight. He states that he eats a lot of pizza but he is trying to cut out high carb foods. He does not exercise. He is not having any difficulty obtaining his medications. He is not having any side effects from his medications. He denies episodes of feeling shaky sweaty week. He does not check his blood sugars daily.  A/P: He will continue metformin 1000 mg BID and we will increase his glipizide to 10 mg once daily. He will continue to try diet and exercise. He will come in on Wednesday 12/24/2018 for a CMP. He will then come in in three months for repeat A1c. He will follow-up with me personally in six months.

## 2018-12-23 ENCOUNTER — Other Ambulatory Visit: Payer: Self-pay

## 2018-12-23 ENCOUNTER — Telehealth: Payer: Self-pay | Admitting: Internal Medicine

## 2018-12-23 DIAGNOSIS — E1159 Type 2 diabetes mellitus with other circulatory complications: Secondary | ICD-10-CM

## 2018-12-23 NOTE — Addendum Note (Signed)
Addended by: Bufford Spikes on: 12/23/2018 10:57 AM   Modules accepted: Orders

## 2018-12-23 NOTE — Telephone Encounter (Signed)
Patient in for lab only visit. CMP order placed.

## 2018-12-30 ENCOUNTER — Telehealth: Payer: Self-pay | Admitting: Neurology

## 2018-12-30 ENCOUNTER — Encounter: Payer: Self-pay | Admitting: Internal Medicine

## 2018-12-30 NOTE — Telephone Encounter (Signed)
Pt called needing a letter explaining why his diagnosis will impair him to be able to serve his [employer]. Please advise.

## 2018-12-30 NOTE — Telephone Encounter (Signed)
Called pt back. Advised Dr. Epimenio Foot has not seen him since 2018. He will not be able to write this letter. I recommended he speak with PCP for letter. Appears Dr. Epimenio Foot advised him to f/u prn and released him back to PCP. Patient will speak with PCP about writing letter and will call our office back if needed.

## 2019-03-04 ENCOUNTER — Other Ambulatory Visit: Payer: Self-pay | Admitting: Internal Medicine

## 2019-03-04 DIAGNOSIS — E118 Type 2 diabetes mellitus with unspecified complications: Secondary | ICD-10-CM

## 2019-03-19 ENCOUNTER — Encounter: Payer: Self-pay | Admitting: Internal Medicine

## 2019-03-19 ENCOUNTER — Other Ambulatory Visit: Payer: Self-pay

## 2019-03-19 ENCOUNTER — Ambulatory Visit (INDEPENDENT_AMBULATORY_CARE_PROVIDER_SITE_OTHER): Payer: Self-pay | Admitting: Internal Medicine

## 2019-03-19 VITALS — BP 111/73 | HR 107 | Temp 99.4°F | Ht 69.0 in | Wt 262.8 lb

## 2019-03-19 DIAGNOSIS — R000 Tachycardia, unspecified: Secondary | ICD-10-CM | POA: Insufficient documentation

## 2019-03-19 DIAGNOSIS — R740 Nonspecific elevation of levels of transaminase and lactic acid dehydrogenase [LDH]: Secondary | ICD-10-CM

## 2019-03-19 DIAGNOSIS — L84 Corns and callosities: Secondary | ICD-10-CM

## 2019-03-19 DIAGNOSIS — R7401 Elevation of levels of liver transaminase levels: Secondary | ICD-10-CM

## 2019-03-19 DIAGNOSIS — R748 Abnormal levels of other serum enzymes: Secondary | ICD-10-CM

## 2019-03-19 DIAGNOSIS — E119 Type 2 diabetes mellitus without complications: Secondary | ICD-10-CM

## 2019-03-19 DIAGNOSIS — Z7984 Long term (current) use of oral hypoglycemic drugs: Secondary | ICD-10-CM

## 2019-03-19 DIAGNOSIS — E1159 Type 2 diabetes mellitus with other circulatory complications: Secondary | ICD-10-CM

## 2019-03-19 LAB — POCT GLYCOSYLATED HEMOGLOBIN (HGB A1C): Hemoglobin A1C: 6.6 % — AB (ref 4.0–5.6)

## 2019-03-19 LAB — GLUCOSE, CAPILLARY: Glucose-Capillary: 215 mg/dL — ABNORMAL HIGH (ref 70–99)

## 2019-03-19 NOTE — Assessment & Plan Note (Signed)
Patient presents for follow up of controlled Diabetes. Last hemoglobin A1c was 7.4, there was concern that weight gain may be contributing to his worsening A1c at last office visit. Today his A1c is 6.6 and his weight is improved by 10 lb from prior when checked three months ago.  - continue glipizide 10 mg daily, metformin 2000 mg daily  -Feet examined today, patient was found to have a callous associated cut which he describes as chronic and slow to heal.  - CMP today to check renal function  - urine microalbumin was checked within the last year and was reassuring  - RTC in 3 months

## 2019-03-19 NOTE — Assessment & Plan Note (Signed)
Patient was incidentally found to have HR 114. He denies symptoms of palpitations, chest pain, shortness of breath or presyncope. He denies anxiety or pain. Says that he had diarrhea this morning and feels that is why his HR is up. On review of the chart it seems that his HR has been elevated in this range in the past. He has not had an EKG in the past and is concerned about the cost of this so he declined having one today.

## 2019-03-19 NOTE — Progress Notes (Addendum)
   CC: follow up of diabetes   HPI:  Carl Conley is a 33 y.o. with PMH as listed below who presents for follow up of diabetes. Please see the assessment and plans for the status of the patient chronic medical problems.    Past Medical History:  Diagnosis Date  . ADHD   . 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 Depression 10/31/2016  . Diabetes mellitus (HCC)    dx date 2004 at age 45  . Low back pain 09/01/2016  . Visual snow syndrome    Review of Systems:  Refer to history of present illness and assessment and plans for pertinent review of systems, all others reviewed and negative  Physical Exam:  Vitals:   03/19/19 1546  BP: 111/73  Pulse: (!) 114  Temp: 99.4 F (37.4 C)  TempSrc: Oral  SpO2: 100%  Height: 5\' 9"  (1.753 m)   General: well appearing, not in acute distress  Cardiac: elevated rate, regular rhythm, no murmurs or gallops  Pulm: normal work of breathing, lungs clear to auscultation  Skin: there is a callous with what looks like a healed ulcer overlying and a cut on one surface. No surrounding erythema or purulent drainage. He is wearing socks and the feet are otherwise clear of suspicious lesion     Assessment & Plan:   Type two diabetes  Patient presents for follow up of controlled Diabetes. Last hemoglobin A1c was 7.4, there was concern that weight gain may be contributing to his worsening A1c at last office visit. Today his A1c is 6.6 and his weight is improved by 10 lb from prior when checked three months ago.  - continue glipizide 10 mg daily, metformin 2000 mg daily  -Feet examined today, patient was found to have a callous associated cut which he describes as chronic and slow to heal.  - CMP today to check renal function  - urine microalbumin was checked within the last year and was reassuring  - RTC in 3 months   Tachycardia  Patient was incidentally found to have HR 114. He denies symptoms of palpitations, chest pain, shortness of breath  or presyncope. He denies anxiety or pain. Says that he had diarrhea this morning and feels that is why his HR is up. On review of the chart it seems that his HR has been elevated in this range in the past. He has not had an EKG in the past and is concerned about the cost of this so he declined having one today.   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 Follow up CMP today revealed persistent mild elevation in the transaminase. He denies the use of herbal remedies, alcohol, or illicit drug either now or in the past.   Ideally I would like to obtain further workup including right upper quadrant ultrasound to evaluate for fatty liver, Iron and TIBC to evaluate for hemochromatosis, and hepatitis B and C serologies. I discussed this with Carl Conley and he said he would call our office back after he "checks on something".   Uninsured status  Patient states that he had an orange card in the past but his coverage has lapsed. He would like to sign up for the orange card again. I have sent a note to nenika willis to let her know.   See Encounters Tab for problem based charting.  Patient discussed with Dr. Criselda Peaches

## 2019-03-19 NOTE — Patient Instructions (Addendum)
Thank you for coming to the clinic today. It was a pleasure to see you.   For your diabetes - your hemoglobin A1c is looking a lot better today at 6.6. Please continue the metformin 1000 mg twice daily and glipizide 10 mg daily    Someone from our office will call you if there is anything unusual on your labwork   Please call and let us know if your right toe starts looking worse  Please call and let us know if you develop any symptoms of the fast heart rate like chest pain, difficulty breathing or light headedness   Schedule a follow up to see your primary care doctor in three months   Please call the internal medicine center clinic if you have any questions or concerns, we may be able to help and keep you from a long and expensive emergency room wait. Our clinic and after hours phone number is [phone], the best time to call is Monday through Friday 9 am to 4 pm but there is always someone available 24/7 if you have an emergency. If you need medication refills please notify your pharmacy one week in advance and they will send [place] a request.

## 2019-03-19 NOTE — Progress Notes (Deleted)
   CC: ***  HPI:  Mr.Joseluis HOBERT SCHAUM is a 33 y.o.   Past Medical History:  Diagnosis Date  . ADHD   . 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 Depression 10/31/2016  . Diabetes mellitus (HCC)    dx date 2004 at age 82  . Low back pain 09/01/2016  . Visual snow syndrome    Review of Systems:  Refer to history of present illness and assessment and plans for pertinent review of systems, all others reviewed and negative  Physical Exam:  Vitals:   03/19/19 1546  BP: 111/73  Pulse: (!) 114  Temp: 99.4 F (37.4 C)  TempSrc: Oral  SpO2: 100%  Height: 5\' 9"  (1.753 m)   ***      Assessment & Plan:   See Encounters Tab for problem based charting.  Patient {GC/GE:[id number]::"discussed with","seen with"} Dr. {NAMES:[id number]::"Butcher","Granfortuna","E. Hoffman","Klima","Mullen","Narendra","Raines","Vincent"}

## 2019-03-20 LAB — CMP14 + ANION GAP
ALT: 106 IU/L — ABNORMAL HIGH (ref 0–44)
AST: 47 IU/L — ABNORMAL HIGH (ref 0–40)
Albumin/Globulin Ratio: 1.8 (ref 1.2–2.2)
Albumin: 4.9 g/dL (ref 4.0–5.0)
Alkaline Phosphatase: 87 IU/L (ref 39–117)
Anion Gap: 18 mmol/L (ref 10.0–18.0)
BUN/Creatinine Ratio: 15 (ref 9–20)
BUN: 15 mg/dL (ref 6–20)
Bilirubin Total: 0.8 mg/dL (ref 0.0–1.2)
CO2: 20 mmol/L (ref 20–29)
Calcium: 10.2 mg/dL (ref 8.7–10.2)
Chloride: 98 mmol/L (ref 96–106)
Creatinine, Ser: 0.99 mg/dL (ref 0.76–1.27)
GFR calc Af Amer: 115 mL/min/{1.73_m2} (ref >59)
GFR calc non Af Amer: 100 mL/min/{1.73_m2} (ref >59)
Globulin, Total: 2.8 g/dL (ref 1.5–4.5)
Glucose: 226 mg/dL — ABNORMAL HIGH (ref 65–99)
Potassium: 4.7 mmol/L (ref 3.5–5.2)
Sodium: 136 mmol/L (ref 134–144)
Total Protein: 7.7 g/dL (ref 6.0–8.5)

## 2019-03-23 NOTE — Progress Notes (Signed)
Internal Medicine Clinic Attending  Case discussed with Dr. Blum at the time of the visit.  We reviewed the resident's history and exam and pertinent patient test results.  I agree with the assessment, diagnosis, and plan of care documented in the resident's note. 

## 2019-04-01 ENCOUNTER — Telehealth: Payer: Self-pay | Admitting: Internal Medicine

## 2019-04-01 DIAGNOSIS — K760 Fatty (change of) liver, not elsewhere classified: Secondary | ICD-10-CM | POA: Insufficient documentation

## 2019-04-01 NOTE — Assessment & Plan Note (Signed)
Carl Conley was incidentally noted to have an elevated ALT of 93 on CMP in 2018. Follow up CMP today revealed persistent mild elevation in the transaminase. He denies the use of herbal remedies, alcohol, or illicit drug either now or in the past.   Ideally I would like to obtain further workup including right upper quadrant ultrasound to evaluate for fatty liver, Iron and TIBC to evaluate for hemochromatosis, and hepatitis B and C serologies. I discussed this with Carl Conley and he said he would call our office back after he "checks on something".

## 2019-04-01 NOTE — Telephone Encounter (Signed)
Pt's would like a call back as to if he still needs to have an Ultrasound done per Dr. Obie Dredge from his last visit.  Pt concerned whether he needs to wait to see his PCP Dr. Caron Presume in July for this to be done.

## 2019-04-02 NOTE — Telephone Encounter (Signed)
Okay to go forward with [place]. Then can review results at visit.

## 2019-04-03 NOTE — Telephone Encounter (Signed)
I think his question may have been about affordability. I have placed the orders.

## 2019-04-03 NOTE — Addendum Note (Signed)
Addended by: Earl Lagos on: 04/03/2019 12:02 PM   Modules accepted: Orders

## 2019-04-03 NOTE — Telephone Encounter (Signed)
F/U with ultrasound Scheduling per patient. No order has been placed.  Please place an order for an Ultrasound for scheduling and the patient will be contacted.

## 2019-04-07 NOTE — Addendum Note (Signed)
Addended by: Bufford Spikes on: 04/07/2019 10:16 AM   Modules accepted: Orders

## 2019-04-07 NOTE — Addendum Note (Signed)
Addended by: Bufford Spikes on: 04/07/2019 10:15 AM   Modules accepted: Orders

## 2019-05-01 ENCOUNTER — Ambulatory Visit (HOSPITAL_COMMUNITY)
Admission: RE | Admit: 2019-05-01 | Discharge: 2019-05-01 | Disposition: A | Discharge status: Home / Self Care | Payer: Self-pay | Source: Ambulatory Visit | Attending: Internal Medicine | Admitting: Internal Medicine

## 2019-05-01 ENCOUNTER — Other Ambulatory Visit: Payer: Self-pay

## 2019-05-01 DIAGNOSIS — R748 Abnormal levels of other serum enzymes: Secondary | ICD-10-CM | POA: Insufficient documentation

## 2019-06-11 ENCOUNTER — Encounter: Payer: Self-pay | Admitting: Internal Medicine

## 2019-06-11 ENCOUNTER — Other Ambulatory Visit: Payer: Self-pay

## 2019-06-11 ENCOUNTER — Ambulatory Visit (INDEPENDENT_AMBULATORY_CARE_PROVIDER_SITE_OTHER): Payer: Self-pay | Admitting: Internal Medicine

## 2019-06-11 VITALS — BP 114/70 | HR 75 | Temp 98.7°F | Ht 71.0 in | Wt 268.8 lb

## 2019-06-11 DIAGNOSIS — E1159 Type 2 diabetes mellitus with other circulatory complications: Secondary | ICD-10-CM

## 2019-06-11 DIAGNOSIS — E119 Type 2 diabetes mellitus without complications: Secondary | ICD-10-CM

## 2019-06-11 DIAGNOSIS — K760 Fatty (change of) liver, not elsewhere classified: Secondary | ICD-10-CM

## 2019-06-11 DIAGNOSIS — Z7984 Long term (current) use of oral hypoglycemic drugs: Secondary | ICD-10-CM

## 2019-06-11 LAB — GLUCOSE, CAPILLARY: Glucose-Capillary: 298 mg/dL — ABNORMAL HIGH (ref 70–99)

## 2019-06-11 LAB — POCT GLYCOSYLATED HEMOGLOBIN (HGB A1C): Hemoglobin A1C: 7 % — AB (ref 4.0–5.6)

## 2019-06-11 NOTE — Assessment & Plan Note (Addendum)
HPI:  Patient found to have elevated ALT > AST on routine labs. Repeat similar. RUQ [place] found to have hepatic steatosis. He is doing well today. He denies the use of EtOH. He denies a family history of liver disease. No risk factors for Hep C.   A/P: - History consistent with NAFLD - Would screen for Hep C at next visit. - Discussed that the only current treatment is treating metabolic disorders and weight loss.

## 2019-06-11 NOTE — Progress Notes (Signed)
   CC: F/u DM and Hepatic Steatosis   HPI:  [name] is a 33 y.o. male with PMHx listed below presenting for F/u DM and Hepatic Steatosis. Please see the A&P for the status of the patient's chronic medical problems.  Past Medical History:  Diagnosis Date  . ADHD   . 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 Depression 10/31/2016  . Diabetes mellitus ([place])    dx date 2004 at age 59  . Low back pain 09/01/2016  . Visual snow syndrome    Review of Systems: Performed and all others negative.  Physical Exam: Vitals:   06/11/19 1334  Weight: 268 lb 12.8 oz (121.9 kg)  Height: 5\' 11"  (1.803 m)   General: Obese male in no acute distress Pulm: Good air movement with no wheezing or crackles  CV: RRR, no murmurs, no rubs   Assessment & Plan:   See Encounters Tab for problem based charting.  Patient discussed with Dr. Rebeca Alert

## 2019-06-11 NOTE — Assessment & Plan Note (Addendum)
HPI:  Well controlled DM on Metformin 1000 mg BID and Glipizide 10 mg QD. He is not having any apparent side effect and no issues affording these medications. Weight is up 6lbs since last visit. He tries to eat a lot of green vegetables. He is walking at least 1.25 miles per day.   A/P: - A1c 7.0  - Metformin 1000 mg BID and Glipizide 10 mg QD.

## 2019-06-11 NOTE — Patient Instructions (Signed)
Thank you for allowing me to provide your care. Keep up the great work with your diabetes.   Please contact your foot doctor to be seen about the wound on your foot.   Continue to exercise and work on weight loss.  Fatty Liver Disease  Fatty liver disease occurs when too much fat has built up in your liver cells. Fatty liver disease is also called hepatic steatosis or steatohepatitis. The liver removes harmful substances from your bloodstream and produces fluids that your body needs. It also helps your body use and store energy from the food you eat. In many cases, fatty liver disease does not cause symptoms or problems. It is often diagnosed when tests are being done for other reasons. However, over time, fatty liver can cause inflammation that may lead to more serious liver problems, such as scarring of the liver (cirrhosis) and liver failure. Fatty liver is associated with insulin resistance, increased body fat, high blood pressure (hypertension), and high cholesterol. These are features of metabolic syndrome and increase your risk for stroke, diabetes, and heart disease. What are the causes? This condition may be caused by:  Drinking too much alcohol.  Poor nutrition.  Obesity.  Cushing's syndrome.  Diabetes.  High cholesterol.  Certain drugs.  Poisons.  Some viral infections.  Pregnancy. What increases the risk? You are more likely to develop this condition if you:  Abuse alcohol.  Are overweight.  Have diabetes.  Have hepatitis.  Have a high triglyceride level.  Are pregnant. What are the signs or symptoms? Fatty liver disease often does not cause symptoms. If symptoms do develop, they can include:  Fatigue.  Weakness.  Weight loss.  Confusion.  Abdominal pain.  Nausea and vomiting.  Yellowing of your skin and the white parts of your eyes (jaundice).  Itchy skin. How is this diagnosed? This condition may be diagnosed by:  A physical exam and  medical history.  Blood tests.  Imaging tests, such as an ultrasound, CT scan, or MRI.  A liver biopsy. A small sample of liver tissue is removed using a needle. The sample is then looked at under a microscope. How is this treated? Fatty liver disease is often caused by other health conditions. Treatment for fatty liver may involve medicines and lifestyle changes to manage conditions such as:  Alcoholism.  High cholesterol.  Diabetes.  Being overweight or obese. Follow these instructions at home:   Do not drink alcohol. If you have trouble quitting, ask your health care provider how to safely quit with the help of medicine or a supervised program. This is important to keep your condition from getting worse.  Eat a healthy diet as told by your health care provider. Ask your health care provider about working with a diet and nutrition specialist (dietitian) to develop an eating plan.  Exercise regularly. This can help you lose weight and control your cholesterol and diabetes. Talk to your health care provider about an exercise plan and which activities are best for you.  Take over-the-counter and prescription medicines only as told by your health care provider.  Keep all follow-up visits as told by your health care provider. This is important. Contact a health care provider if: You have trouble controlling your:  Blood sugar. This is especially important if you have diabetes.  Cholesterol.  Drinking of alcohol. Get help right away if:  You have abdominal pain.  You have jaundice.  You have nausea and vomiting.  You vomit blood or material that  looks like coffee grounds.  You have stools that are black, tar-like, or bloody. Summary  Fatty liver disease develops when too much fat builds up in the cells of your liver.  Fatty liver disease often causes no symptoms or problems. However, over time, fatty liver can cause inflammation that may lead to more serious liver  problems, such as scarring of the liver (cirrhosis).  You are more likely to develop this condition if you abuse alcohol, are pregnant, are overweight, have diabetes, have hepatitis, or have high triglyceride levels.  Contact your health care provider if you have trouble controlling your weight, blood sugar, cholesterol, or drinking of alcohol. This information is not intended to replace advice given to you by your health care provider. Make sure you discuss any questions you have with your health care provider. Document Released: 12/07/2005 Document Revised: 10/04/2017 Document Reviewed: 07/31/2017 Elsevier Patient Education  2020 [employer]

## 2019-06-12 ENCOUNTER — Encounter: Payer: Self-pay | Admitting: Internal Medicine

## 2019-06-16 NOTE — Progress Notes (Signed)
Internal Medicine Clinic Attending  Case discussed with Dr. Helberg at the time of the visit.  We reviewed the resident's history and exam and pertinent patient test results.  I agree with the assessment, diagnosis, and plan of care documented in the resident's note.  Alexander Raines, M.D., Ph.D.  

## 2019-07-06 ENCOUNTER — Ambulatory Visit: Payer: Self-pay

## 2019-07-31 ENCOUNTER — Other Ambulatory Visit: Payer: Self-pay | Admitting: Internal Medicine

## 2019-07-31 DIAGNOSIS — E118 Type 2 diabetes mellitus with unspecified complications: Secondary | ICD-10-CM

## 2019-09-10 ENCOUNTER — Other Ambulatory Visit: Payer: Self-pay | Admitting: *Deleted

## 2019-09-10 ENCOUNTER — Other Ambulatory Visit: Payer: Self-pay

## 2019-09-10 ENCOUNTER — Encounter: Payer: Self-pay | Admitting: Internal Medicine

## 2019-09-10 ENCOUNTER — Ambulatory Visit (INDEPENDENT_AMBULATORY_CARE_PROVIDER_SITE_OTHER): Payer: Self-pay | Admitting: Internal Medicine

## 2019-09-10 VITALS — BP 111/77 | HR 91 | Ht 71.0 in | Wt 270.8 lb

## 2019-09-10 DIAGNOSIS — E1159 Type 2 diabetes mellitus with other circulatory complications: Secondary | ICD-10-CM

## 2019-09-10 DIAGNOSIS — Z7984 Long term (current) use of oral hypoglycemic drugs: Secondary | ICD-10-CM

## 2019-09-10 DIAGNOSIS — K760 Fatty (change of) liver, not elsewhere classified: Secondary | ICD-10-CM

## 2019-09-10 DIAGNOSIS — Z6837 Body mass index (BMI) 37.0-37.9, adult: Secondary | ICD-10-CM

## 2019-09-10 DIAGNOSIS — E118 Type 2 diabetes mellitus with unspecified complications: Secondary | ICD-10-CM

## 2019-09-10 DIAGNOSIS — G44209 Tension-type headache, unspecified, not intractable: Secondary | ICD-10-CM | POA: Insufficient documentation

## 2019-09-10 LAB — POCT GLYCOSYLATED HEMOGLOBIN (HGB A1C): Hemoglobin A1C: 8.2 % — AB (ref 4.0–5.6)

## 2019-09-10 LAB — GLUCOSE, CAPILLARY: Glucose-Capillary: 295 mg/dL — ABNORMAL HIGH (ref 70–99)

## 2019-09-10 MED ORDER — INVOKAMET XR 50-1000 MG PO TB24: 1.0000 | ORAL_TABLET | Freq: Two times a day (BID) | ORAL | 0 refills | Status: DC

## 2019-09-10 MED FILL — INVOKAMET XR 50-1,000 MG TA: 50-1000 | 30 days supply | Qty: 60 | Fill #0

## 2019-09-10 NOTE — Progress Notes (Signed)
   CC: DM, NAFLD, Obesity  HPI:  Mr.Gaurav YUVAAN OLANDER is a 33 y.o. male with PMHx listed below presenting for DM, NAFLD, Obesity. Please see the A&P for the status of the patient's chronic medical problems.  Past Medical History:  Diagnosis Date  . ADHD   . 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 Depression 10/31/2016  . Diabetes mellitus ([place])    dx date 2004 at age 49  . Low back pain 09/01/2016  . Visual snow syndrome    Review of Systems:  Performed and all others negative.  Physical Exam: Vitals:   09/10/19 1411  BP: 111/77  Pulse: 91  SpO2: 98%  Weight: 270 lb 12.8 oz (122.8 kg)  Height: 5\' 11"  (1.803 m)   General: Morbidly obese male in no acute distress Pulm: Good air movement with no wheezing or crackles  CV: RRR, no murmurs, no rubs   Assessment & Plan:   See Encounters Tab for problem based charting.  Patient discussed with Dr. Dareen Piano

## 2019-09-10 NOTE — Patient Instructions (Signed)
Thank you for allowing [place] to provide your care. Today we are starting you on a new medication called Invokana. I have sent out a combination pill with your metformin. Therefore, STOP taking the individual metformin.   We will also check some blood work and urine. I will call you if we need to change any of your medications.   Continue to work on diet and exercise, this will be the most beneficial for your health. Come back to see me in 3 months.

## 2019-09-10 NOTE — Assessment & Plan Note (Signed)
Patient with fatty liver disease. We will check a hepatitis C antibody today. Advised weight loss and lifestyle modification to treat his fatty liver disease. We will continue to monitor his LFTs periodically.

## 2019-09-11 LAB — HEPATITIS C ANTIBODY: Hep C Virus Ab: <0.1 s/co ratio (ref 0.0–0.9)

## 2019-09-11 NOTE — Progress Notes (Signed)
Internal Medicine Clinic Attending  Case discussed with Dr. Helberg at the time of the visit.  We reviewed the resident's history and exam and pertinent patient test results.  I agree with the assessment, diagnosis, and plan of care documented in the resident's note.    

## 2019-09-11 NOTE — Assessment & Plan Note (Signed)
Patient with uncontrolled DM. He is currently on Glipizide 10 mg QD and Metformin 1000 mg BID. He is taking his medications as prescribed. He has not missed any doses. He was previously walking 1.25 miles per day but since his last visit he has stopped. He is eating a lot of microwave foods.   A/P: - Uncontrolled. A1c increased to 8.3% - Continue Glipizide 10 mg QD and Metformin 1000 mg BID - Will add on Invokana 100 mg QD. Sent out combination Invokana-metformin as it is on the $4 list.

## 2019-09-11 NOTE — Assessment & Plan Note (Signed)
Patient presents with bilateral headaches. Describes them as a tightness around his head. No associated N/V, visual changes, phonophobia. No fevers, weight loss, positional headache, history of cancer, history of IV drug use. Use of tylenol and NSAIDs do help his pain. He is testing a hypothesis that blue lights make his vision worse so he has had almost constant screen time the last couple of weeks.   PE with normal neurological exam.   A/P: - Tension type headaches.  - Recommended limiting screen time

## 2019-09-28 ENCOUNTER — Encounter: Payer: Self-pay | Admitting: Internal Medicine

## 2019-09-28 DIAGNOSIS — F329 Major depressive disorder, single episode, unspecified: Secondary | ICD-10-CM

## 2019-09-28 DIAGNOSIS — F32A Depression, unspecified: Secondary | ICD-10-CM

## 2019-09-29 MED ORDER — CITALOPRAM HYDROBROMIDE 20 MG PO TABS: 20.0000 mg | ORAL_TABLET | Freq: Every day | ORAL | 2 refills | Status: DC

## 2019-10-05 NOTE — Addendum Note (Signed)
Addended by: Truddie Crumble on: 10/05/2019 02:41 PM   Modules accepted: Orders

## 2019-10-21 ENCOUNTER — Other Ambulatory Visit: Payer: Self-pay | Admitting: Internal Medicine

## 2019-10-21 DIAGNOSIS — F329 Major depressive disorder, single episode, unspecified: Secondary | ICD-10-CM

## 2019-10-21 DIAGNOSIS — F32A Depression, unspecified: Secondary | ICD-10-CM

## 2019-11-25 ENCOUNTER — Other Ambulatory Visit: Payer: Self-pay | Admitting: Internal Medicine

## 2019-11-25 DIAGNOSIS — E119 Type 2 diabetes mellitus without complications: Secondary | ICD-10-CM

## 2019-12-24 ENCOUNTER — Encounter: Payer: Self-pay | Admitting: Internal Medicine

## 2020-01-01 ENCOUNTER — Other Ambulatory Visit: Payer: Self-pay | Admitting: Internal Medicine

## 2020-01-01 DIAGNOSIS — E1159 Type 2 diabetes mellitus with other circulatory complications: Secondary | ICD-10-CM

## 2020-01-04 ENCOUNTER — Other Ambulatory Visit: Payer: Self-pay | Admitting: Internal Medicine

## 2020-01-04 DIAGNOSIS — E1159 Type 2 diabetes mellitus with other circulatory complications: Secondary | ICD-10-CM

## 2020-01-04 MED ORDER — INVOKAMET XR 50-1000 MG PO TB24: 1.0000 | ORAL_TABLET | Freq: Two times a day (BID) | ORAL | 0 refills | Status: DC

## 2020-01-04 NOTE — Telephone Encounter (Signed)
Next appt scheduled 01/14/20 with PCP. 

## 2020-01-05 MED FILL — INVOKAMET XR 50-1,000 MG TA: 50-1000 | 30 days supply | Qty: 60 | Fill #0

## 2020-01-14 ENCOUNTER — Encounter: Payer: Self-pay | Admitting: Internal Medicine

## 2020-01-26 ENCOUNTER — Other Ambulatory Visit: Payer: Self-pay | Admitting: Internal Medicine

## 2020-01-26 DIAGNOSIS — E118 Type 2 diabetes mellitus with unspecified complications: Secondary | ICD-10-CM

## 2020-02-03 ENCOUNTER — Other Ambulatory Visit: Payer: Self-pay | Admitting: Internal Medicine

## 2020-02-03 DIAGNOSIS — E1159 Type 2 diabetes mellitus with other circulatory complications: Secondary | ICD-10-CM

## 2020-02-04 ENCOUNTER — Ambulatory Visit: Payer: Self-pay | Admitting: Internal Medicine

## 2020-02-04 ENCOUNTER — Other Ambulatory Visit: Payer: Self-pay

## 2020-02-04 ENCOUNTER — Encounter: Payer: Self-pay | Admitting: Internal Medicine

## 2020-02-04 VITALS — BP 105/64 | HR 72 | Temp 99.0°F | Ht 71.0 in | Wt 260.7 lb

## 2020-02-04 DIAGNOSIS — L98499 Non-pressure chronic ulcer of skin of other sites with unspecified severity: Secondary | ICD-10-CM

## 2020-02-04 DIAGNOSIS — K760 Fatty (change of) liver, not elsewhere classified: Secondary | ICD-10-CM

## 2020-02-04 DIAGNOSIS — Z8631 Personal history of diabetic foot ulcer: Secondary | ICD-10-CM

## 2020-02-04 DIAGNOSIS — E1159 Type 2 diabetes mellitus with other circulatory complications: Secondary | ICD-10-CM

## 2020-02-04 DIAGNOSIS — Z7984 Long term (current) use of oral hypoglycemic drugs: Secondary | ICD-10-CM

## 2020-02-04 DIAGNOSIS — E119 Type 2 diabetes mellitus without complications: Secondary | ICD-10-CM

## 2020-02-04 LAB — POCT GLYCOSYLATED HEMOGLOBIN (HGB A1C): Hemoglobin A1C: 5.7 % — AB (ref 4.0–5.6)

## 2020-02-04 LAB — GLUCOSE, CAPILLARY: Glucose-Capillary: 172 mg/dL — ABNORMAL HIGH (ref 70–99)

## 2020-02-04 MED ORDER — INVOKAMET XR 50-1000 MG PO TB24: 1.0000 | ORAL_TABLET | Freq: Two times a day (BID) | ORAL | 0 refills | Status: DC

## 2020-02-04 MED FILL — INVOKAMET XR 50-1,000 MG TA: 50-1000 | 30 days supply | Qty: 60 | Fill #0

## 2020-02-04 NOTE — Progress Notes (Signed)
   CC: NAFLD and DM  HPI:  Carl Conley is a 34 y.o. male with PMHx listed below presenting for NAFLD and DM. Please see the A&P for the status of the patient's chronic medical problems.  Past Medical History:  Diagnosis Date  . ADHD   . 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 Depression 10/31/2016  . Diabetes mellitus (HCC)    dx date 2004 at age 60  . Low back pain 09/01/2016  . Visual snow syndrome    Review of Systems:  Performed and all others negative.  Physical Exam: Vitals:   02/04/20 1402  BP: 105/64  Pulse: 72  Temp: 99 F (37.2 C)  TempSrc: Oral  Weight: 260 lb 11.2 oz (118.3 kg)   General: Obese male in no acute distress Pulm: Good air movement with no wheezing or crackles  CV: RRR, no murmurs, no rubs   Assessment & Plan:   See Encounters Tab for problem based charting.  Patient discussed with Dr. [name]

## 2020-02-04 NOTE — Patient Instructions (Signed)
Thank you for allowing me to provide your care. Today we're not making any changes to her medications. Continue to try to work on your diet and exercise. This will be the best thing for your health. I will call you with the results of your blood work and whether we need to make any changes.  Please come back in three months to meet your new primary care doctor.

## 2020-02-04 NOTE — Assessment & Plan Note (Signed)
Checking CMP and CBC today. Patient has lost 10 pounds since our last visit. Continue to encourage lifestyle modifications.

## 2020-02-04 NOTE — Assessment & Plan Note (Signed)
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 Has since resolved. No new ulcers today on foot exam.

## 2020-02-04 NOTE — Progress Notes (Signed)
Internal Medicine Clinic Attending  Case discussed with Dr. Helberg at the time of the visit.  We reviewed the resident's history and exam and pertinent patient test results.  I agree with the assessment, diagnosis, and plan of care documented in the resident's note.    

## 2020-02-04 NOTE — Assessment & Plan Note (Signed)
Patient with uncontrolled type II diabetes. He is currently on glipizide 10 mg daily and invokona/metformin 50/1000 mg twice daily. He is tolerating both these medications well. He needs a refill on his Invokana/metformin. Unfortunately is not been able to start exercising. He states that he has a lot going on right now and is unable to do so. He eats a lot of sandwiches, macaroni and cheese, and a bean/sausage dish. He does not check his sugars. Denies signs or symptoms of hypoglycemia. He declined urine microalbumin testing today.   A/P: - A1c 5.7 today  - Continue glipizide 10 mg daily and invokona/metformin 50/1000 mg twice daily - Continue to recommend microalbumin testing  - Needs statin therapy but declined - He will pursue DM eye exam with his optho

## 2020-02-05 LAB — CBC
Hematocrit: 47.6 % (ref 37.5–51.0)
Hemoglobin: 16.8 g/dL (ref 13.0–17.7)
MCH: 29.1 pg (ref 26.6–33.0)
MCHC: 35.3 g/dL (ref 31.5–35.7)
MCV: 83 fL (ref 79–97)
Platelets: 255 10*3/uL (ref 150–450)
RBC: 5.77 x10E6/uL (ref 4.14–5.80)
RDW: 12.8 % (ref 11.6–15.4)
WBC: 9.3 10*3/uL (ref 3.4–10.8)

## 2020-02-05 LAB — CMP14 + ANION GAP
ALT: 46 IU/L — ABNORMAL HIGH (ref 0–44)
AST: 25 IU/L (ref 0–40)
Albumin/Globulin Ratio: 1.5 (ref 1.2–2.2)
Albumin: 4.6 g/dL (ref 4.0–5.0)
Alkaline Phosphatase: 87 IU/L (ref 39–117)
Anion Gap: 16 mmol/L (ref 10.0–18.0)
BUN/Creatinine Ratio: 12 (ref 9–20)
BUN: 12 mg/dL (ref 6–20)
Bilirubin Total: 0.7 mg/dL (ref 0.0–1.2)
CO2: 24 mmol/L (ref 20–29)
Calcium: 10.3 mg/dL — ABNORMAL HIGH (ref 8.7–10.2)
Chloride: 98 mmol/L (ref 96–106)
Creatinine, Ser: 1.01 mg/dL (ref 0.76–1.27)
GFR calc Af Amer: 112 mL/min/{1.73_m2} (ref >59)
GFR calc non Af Amer: 97 mL/min/{1.73_m2} (ref >59)
Globulin, Total: 3 g/dL (ref 1.5–4.5)
Glucose: 171 mg/dL — ABNORMAL HIGH (ref 65–99)
Potassium: 4.6 mmol/L (ref 3.5–5.2)
Sodium: 138 mmol/L (ref 134–144)
Total Protein: 7.6 g/dL (ref 6.0–8.5)

## 2020-03-02 ENCOUNTER — Other Ambulatory Visit: Payer: Self-pay | Admitting: Internal Medicine

## 2020-03-02 DIAGNOSIS — E1159 Type 2 diabetes mellitus with other circulatory complications: Secondary | ICD-10-CM

## 2020-03-02 MED ORDER — INVOKAMET XR 50-1000 MG PO TB24: 1.0000 | ORAL_TABLET | Freq: Two times a day (BID) | ORAL | 0 refills | Status: DC

## 2020-03-02 MED FILL — INVOKAMET XR 50-1,000 MG TA: 50-1000 | 30 days supply | Qty: 60 | Fill #0

## 2020-03-10 ENCOUNTER — Encounter: Payer: Self-pay | Admitting: *Deleted

## 2020-03-24 ENCOUNTER — Ambulatory Visit: Payer: Self-pay | Attending: Internal Medicine

## 2020-03-24 DIAGNOSIS — Z23 Encounter for immunization: Secondary | ICD-10-CM

## 2020-03-24 NOTE — Progress Notes (Signed)
   Covid-19 Vaccination Clinic  Name:  Carl Conley    MRN: [id number] DOB: Sep 26, 1986  03/24/2020  [name] was observed post Covid-19 immunization for 15 minutes without incident. He was provided with Vaccine Information Sheet and instruction to access the V-Safe system.   [name] was instructed to call 911 with any severe reactions post vaccine: Marland Kitchen Difficulty breathing  . Swelling of face and throat  . A fast heartbeat  . A bad rash all over body  . Dizziness and weakness   Immunizations Administered    Name Date Dose VIS Date Route   Pfizer COVID-19 Vaccine 03/24/2020  4:00 PM 0.3 mL 12/30/2018 Intramuscular   Manufacturer: [employer], [employer]   Lot: [id number]   NDC: [address]-7

## 2020-04-03 ENCOUNTER — Other Ambulatory Visit: Payer: Self-pay | Admitting: Internal Medicine

## 2020-04-03 DIAGNOSIS — E1159 Type 2 diabetes mellitus with other circulatory complications: Secondary | ICD-10-CM

## 2020-04-07 MED ORDER — INVOKAMET XR 50-1000 MG PO TB24: 1.0000 | ORAL_TABLET | Freq: Two times a day (BID) | ORAL | 1 refills | Status: DC

## 2020-04-07 MED FILL — INVOKAMET XR 50-1,000 MG TA: 50-1000 | 30 days supply | Qty: 60 | Fill #0

## 2020-04-21 ENCOUNTER — Ambulatory Visit: Payer: Self-pay | Attending: Internal Medicine

## 2020-04-21 ENCOUNTER — Other Ambulatory Visit: Payer: Self-pay | Admitting: Internal Medicine

## 2020-04-21 DIAGNOSIS — Z23 Encounter for immunization: Secondary | ICD-10-CM

## 2020-04-21 DIAGNOSIS — F32A Depression, unspecified: Secondary | ICD-10-CM

## 2020-04-21 NOTE — Progress Notes (Signed)
   Covid-19 Vaccination Clinic  Name:  MCGREGOR TINNON    MRN: [id number] DOB: [birth year]  04/21/2020  Mr. Haywood was observed post Covid-19 immunization for 15 minutes without incident. He was provided with Vaccine Information Sheet and instruction to access the V-Safe system.   Mr. Lacock was instructed to call 911 with any severe reactions post vaccine: Marland Kitchen Difficulty breathing  . Swelling of face and throat  . A fast heartbeat  . A bad rash all over body  . Dizziness and weakness   Immunizations Administered    Name Date Dose VIS Date Route   Pfizer COVID-19 Vaccine 04/21/2020  3:57 PM 0.3 mL 12/30/2018 Intramuscular   Manufacturer: [employer], [employer]   Lot: [id number]   NDC: [address]-3

## 2020-05-23 ENCOUNTER — Ambulatory Visit: Payer: Self-pay

## 2020-05-23 MED FILL — INVOKAMET XR 50-1,000 MG TA: 50-1000 | 30 days supply | Qty: 60 | Fill #1

## 2020-06-22 ENCOUNTER — Other Ambulatory Visit: Payer: Self-pay | Admitting: Student

## 2020-06-22 ENCOUNTER — Ambulatory Visit: Payer: Self-pay | Admitting: Student

## 2020-06-22 ENCOUNTER — Other Ambulatory Visit: Payer: Self-pay

## 2020-06-22 VITALS — BP 99/68 | HR 69 | Wt 258.9 lb

## 2020-06-22 DIAGNOSIS — E1149 Type 2 diabetes mellitus with other diabetic neurological complication: Secondary | ICD-10-CM

## 2020-06-22 DIAGNOSIS — F32A Depression, unspecified: Secondary | ICD-10-CM

## 2020-06-22 DIAGNOSIS — F329 Major depressive disorder, single episode, unspecified: Secondary | ICD-10-CM

## 2020-06-22 DIAGNOSIS — E1159 Type 2 diabetes mellitus with other circulatory complications: Secondary | ICD-10-CM

## 2020-06-22 LAB — POCT GLYCOSYLATED HEMOGLOBIN (HGB A1C): Hemoglobin A1C: 6.5 % — AB (ref 4.0–5.6)

## 2020-06-22 LAB — GLUCOSE, CAPILLARY: Glucose-Capillary: 132 mg/dL — ABNORMAL HIGH (ref 70–99)

## 2020-06-22 MED ORDER — INVOKAMET XR 150-1000 MG PO TB24: 2.0000 | ORAL_TABLET | Freq: Every day | ORAL | 5 refills | Status: DC

## 2020-06-22 MED ORDER — INVOKAMET 150-1000 MG PO TABS: 1.0000 | ORAL_TABLET | Freq: Two times a day (BID) | ORAL | 6 refills | Status: DC

## 2020-06-22 MED FILL — INVOKAMET XR 150-1,000 MG T: 150-1000 | 30 days supply | Qty: 60 | Fill #0

## 2020-06-22 NOTE — Patient Instructions (Addendum)
Thank you for allowing Carl Conley to be a part of your medial care! I enjoyed meeting you.   Today we discussed:   Diabetes - Your A1c was 6.5. Keep up the good work in terms of exercising and working on your diet - Please stop taking the glipizide and pick up your new medication from the pharmacy.  - We will see you in 3 months for a follow up  Thank you and please let Carl Conley know if you have any further questions!    Diabetes Mellitus and Nutrition, Adult When you have diabetes (diabetes mellitus), it is very important to have healthy eating habits because your blood sugar (glucose) levels are greatly affected by what you eat and drink. Eating healthy foods in the appropriate amounts, at about the same times every day, can help you:  Control your blood glucose.  Lower your risk of heart disease.  Improve your blood pressure.  Reach or maintain a healthy weight. Every person with diabetes is different, and each person has different needs for a meal plan. Your health care provider may recommend that you work with a diet and nutrition specialist (dietitian) to make a meal plan that is best for you. Your meal plan may vary depending on factors such as:  The calories you need.  The medicines you take.  Your weight.  Your blood glucose, blood pressure, and cholesterol levels.  Your activity level.  Other health conditions you have, such as heart or kidney disease. How do carbohydrates affect me? Carbohydrates, also called carbs, affect your blood glucose level more than any other type of food. Eating carbs naturally raises the amount of glucose in your blood. Carb counting is a method for keeping track of how many carbs you eat. Counting carbs is important to keep your blood glucose at a healthy level, especially if you use insulin or take certain oral diabetes medicines. It is important to know how many carbs you can safely have in each meal. This is different for every person. Your dietitian  can help you calculate how many carbs you should have at each meal and for each snack. Foods that contain carbs include:  Bread, cereal, rice, pasta, and crackers.  Potatoes and corn.  Peas, beans, and lentils.  Milk and yogurt.  Fruit and juice.  Desserts, such as cakes, cookies, ice cream, and candy. How does alcohol affect me? Alcohol can cause a sudden decrease in blood glucose (hypoglycemia), especially if you use insulin or take certain oral diabetes medicines. Hypoglycemia can be a life-threatening condition. Symptoms of hypoglycemia (sleepiness, dizziness, and confusion) are similar to symptoms of having too much alcohol. If your health care provider says that alcohol is safe for you, follow these guidelines:  Limit alcohol intake to no more than 1 drink per day for nonpregnant women and 2 drinks per day for men. One drink equals 12 oz of beer, 5 oz of wine, or 1 oz of hard liquor.  Do not drink on an empty stomach.  Keep yourself hydrated with water, diet soda, or unsweetened iced tea.  Keep in mind that regular soda, juice, and other mixers may contain a lot of sugar and must be counted as carbs. What are tips for following this plan?  Reading food labels  Start by checking the serving size on the "Nutrition Facts" label of packaged foods and drinks. The amount of calories, carbs, fats, and other nutrients listed on the label is based on one serving of the item. Many  items contain more than one serving per package.  Check the total grams (g) of carbs in one serving. You can calculate the number of servings of carbs in one serving by dividing the total carbs by 15. For example, if a food has 30 g of total carbs, it would be equal to 2 servings of carbs.  Check the number of grams (g) of saturated and trans fats in one serving. Choose foods that have low or no amount of these fats.  Check the number of milligrams (mg) of salt (sodium) in one serving. Most people should  limit total sodium intake to less than 2,300 mg per day.  Always check the nutrition information of foods labeled as "low-fat" or "nonfat". These foods may be higher in added sugar or refined carbs and should be avoided.  Talk to your dietitian to identify your daily goals for nutrients listed on the label. Shopping  Avoid buying canned, premade, or processed foods. These foods tend to be high in fat, sodium, and added sugar.  Shop around the outside edge of the grocery store. This includes fresh fruits and vegetables, bulk grains, fresh meats, and fresh dairy. Cooking  Use low-heat cooking methods, such as baking, instead of high-heat cooking methods like deep frying.  Cook using healthy oils, such as olive, canola, or sunflower oil.  Avoid cooking with butter, cream, or high-fat meats. Meal planning  Eat meals and snacks regularly, preferably at the same times every day. Avoid going long periods of time without eating.  Eat foods high in fiber, such as fresh fruits, vegetables, beans, and whole grains. Talk to your dietitian about how many servings of carbs you can eat at each meal.  Eat 4-6 ounces (oz) of lean protein each day, such as lean meat, chicken, fish, eggs, or tofu. One oz of lean protein is equal to: ? 1 oz of meat, chicken, or fish. ? 1 egg. ?  cup of tofu.  Eat some foods each day that contain healthy fats, such as avocado, nuts, seeds, and fish. Lifestyle  Check your blood glucose regularly.  Exercise regularly as told by your health care provider. This may include: ? 150 minutes of moderate-intensity or vigorous-intensity exercise each week. This could be brisk walking, biking, or water aerobics. ? Stretching and doing strength exercises, such as yoga or weightlifting, at least 2 times a week.  Take medicines as told by your health care provider.  Do not use any products that contain nicotine or tobacco, such as cigarettes and e-cigarettes. If you need help  quitting, ask your health care provider.  Work with a Veterinary surgeon or diabetes educator to identify strategies to manage stress and any emotional and social challenges. Questions to ask a health care provider  Do I need to meet with a diabetes educator?  Do I need to meet with a dietitian?  What number can I call if I have questions?  When are the best times to check my blood glucose? Where to find more information:  American Diabetes Association: diabetes.org  Academy of Nutrition and Dietetics: www.eatright.[employer] of Diabetes and Digestive and Kidney Diseases (NIH): CarFlippers.tn Summary  A healthy meal plan will help you control your blood glucose and maintain a healthy lifestyle.  Working with a diet and nutrition specialist (dietitian) can help you make a meal plan that is best for you.  Keep in mind that carbohydrates (carbs) and alcohol have immediate effects on your blood glucose levels. It is important  to count carbs and to use alcohol carefully. This information is not intended to replace advice given to you by your health care provider. Make sure you discuss any questions you have with your health care provider. Document Revised: 10/04/2017 Document Reviewed: 11/26/2016 Elsevier Patient Education  2020 [employer].

## 2020-06-22 NOTE — Progress Notes (Signed)
   CC: Meet New PCP  HPI:  Carl Conley is a 34 y.o. M with a PMHx of ADHD, T2DM, and depression who presents to the clinic to meet his new PCP. He states he is doing well overall and is having cataract surgery in the coming months. The patient was supposed to have the surgery within the last month, however, he was found to have corneal abrasions that his opthamologist wanted to have heal before the procedure occurred.   He states he has had an episode of low sugars, he notes the most recent event it was in the 40's and he went to urgent care. He states he has been exercising recently whether that be lifting 10lb dumbless or going on walks. He states he is also trying to improve his diet and he notes cutting out sodas and drinking water with flavoring packets.   He states his depression is well controlled on his citalopram. He notes increase in sadness recently after the family dog passed away, but he is doing better.   He notes his health goals are to have his diabetes under control, lose weight, and have his visual snow looked into more.   He has no other complaints at this time.   Past Medical History:  Diagnosis Date  . ADHD   . 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 Depression 10/31/2016  . Diabetes mellitus (HCC)    dx date 2004 at age 69  . Low back pain 09/01/2016  . Visual snow syndrome    Review of Systems:  As Per HPI  Physical Exam:  Vitals:   06/22/20 1552  BP: 99/68  Pulse: 69  SpO2: 98%  Weight: 258 lb 14.4 oz (117.4 kg)   Physical Exam Vitals and nursing note reviewed.  Constitutional:      Appearance: Normal appearance.  HENT:     Head: Normocephalic and atraumatic.  Eyes:     Extraocular Movements: Extraocular movements intact.  Cardiovascular:     Rate and Rhythm: Normal rate and regular rhythm.     Pulses: Normal pulses.     Heart sounds: No murmur heard.  No gallop.   Pulmonary:     Effort: Pulmonary effort is normal. No respiratory  distress.  Musculoskeletal:     Cervical back: Normal range of motion.  Neurological:     General: No focal deficit present.     Mental Status: He is alert and oriented to person, place, and time. Mental status is at baseline.  Psychiatric:        Mood and Affect: Mood normal.        Behavior: Behavior normal.     Assessment & Plan:   See Encounters Tab for problem based charting.  Patient discussed and seen by Dr. Mikey Bussing

## 2020-06-22 NOTE — Assessment & Plan Note (Addendum)
Will check diabetic foot exam next visit. Patient instructed to check feet nightly to check for redness, irritation, ulcers, etc.

## 2020-06-22 NOTE — Assessment & Plan Note (Addendum)
A1c of 6.5 today. Patient notes one episode of hypoglycemia. He continues to work on diet and has started exercising recently. He states he would be interesting in diabetic diet counseling.   Risk and benefits of increasing invokamet dose discussed with patient. Risks discussed include fourniere gangrene and risk of amputation.   A/P - Will increase Invokamet XR from 50-1000 to [phone]. Will DC glipizide - Patient to follow up in 3 months to check A1c.  - Patient currently not insured, but printed off diabetic packet diet and discussed with patient once he is insured I will refer him to Lupita Leash for diabetic nutrition management.

## 2020-06-22 NOTE — Assessment & Plan Note (Signed)
Well controlled on his citalopram. Offered patient counseling if he ever feels as though he needed it.   A/P - Continue citalopram 20 mg.  - PHQ-9 of 3 on today's visit.

## 2020-06-24 NOTE — Progress Notes (Signed)
Internal Medicine Clinic Attending  I saw and evaluated the patient.  I personally confirmed the key portions of the history and exam documented by Dr. Katsadouros and I reviewed pertinent patient test results.  The assessment, diagnosis, and plan were formulated together and I agree with the documentation in the resident's note.  

## 2020-07-18 MED FILL — INVOKAMET XR 150-1,000 MG T: 150-1000 | 30 days supply | Qty: 60 | Fill #1

## 2020-07-19 ENCOUNTER — Other Ambulatory Visit: Payer: Self-pay | Admitting: Internal Medicine

## 2020-07-19 DIAGNOSIS — E118 Type 2 diabetes mellitus with unspecified complications: Secondary | ICD-10-CM

## 2020-08-19 MED FILL — INVOKAMET XR 150-1,000 MG T: 150-1000 | 30 days supply | Qty: 60 | Fill #2

## 2020-09-16 MED FILL — INVOKAMET XR 150-1,000 MG T: 150-1000 | 30 days supply | Qty: 60 | Fill #3

## 2020-10-18 MED FILL — INVOKAMET XR 150-1,000 MG T: 150-1000 | 30 days supply | Qty: 60 | Fill #4

## 2020-11-02 ENCOUNTER — Other Ambulatory Visit: Payer: Self-pay | Admitting: *Deleted

## 2020-11-02 DIAGNOSIS — F32A Depression, unspecified: Secondary | ICD-10-CM

## 2020-11-02 MED ORDER — CITALOPRAM HYDROBROMIDE 20 MG PO TABS: 20.0000 mg | ORAL_TABLET | Freq: Every day | ORAL | 1 refills | Status: DC

## 2020-11-02 NOTE — Telephone Encounter (Signed)
Next appt scheduled 11/11/19 with Dr Mcarthur Rossetti.

## 2020-11-10 ENCOUNTER — Other Ambulatory Visit: Payer: Self-pay

## 2020-11-10 ENCOUNTER — Ambulatory Visit (INDEPENDENT_AMBULATORY_CARE_PROVIDER_SITE_OTHER): Payer: Self-pay | Admitting: Internal Medicine

## 2020-11-10 ENCOUNTER — Encounter: Payer: Self-pay | Admitting: Internal Medicine

## 2020-11-10 VITALS — BP 98/69 | HR 73 | Temp 98.5°F | Ht 71.0 in | Wt 248.2 lb

## 2020-11-10 DIAGNOSIS — L219 Seborrheic dermatitis, unspecified: Secondary | ICD-10-CM

## 2020-11-10 DIAGNOSIS — E1159 Type 2 diabetes mellitus with other circulatory complications: Secondary | ICD-10-CM

## 2020-11-10 DIAGNOSIS — E1149 Type 2 diabetes mellitus with other diabetic neurological complication: Secondary | ICD-10-CM

## 2020-11-10 LAB — POCT GLYCOSYLATED HEMOGLOBIN (HGB A1C): Hemoglobin A1C: 5.8 % — AB (ref 4.0–5.6)

## 2020-11-10 LAB — GLUCOSE, CAPILLARY: Glucose-Capillary: 179 mg/dL — ABNORMAL HIGH (ref 70–99)

## 2020-11-10 MED ORDER — KETOCONAZOLE 2 % EX CREA: 1.0000 "application " | TOPICAL_CREAM | Freq: Two times a day (BID) | CUTANEOUS | 0 refills | Status: DC

## 2020-11-10 MED ORDER — HYDROCORTISONE 1 % EX CREA: TOPICAL_CREAM | CUTANEOUS | 1 refills | Status: AC

## 2020-11-10 MED ORDER — KETOCONAZOLE 2 % EX SHAM: 1.0000 "application " | MEDICATED_SHAMPOO | CUTANEOUS | 0 refills | Status: DC

## 2020-11-10 NOTE — Progress Notes (Signed)
   CC: type II diabetes  HPI:  Carl Conley is a 35 y.o. with PMH as below.   Please see A&P for assessment of the patient's acute and chronic medical conditions. He is accompanied by his mom today.    Past Medical History:  Diagnosis Date  . ADHD   . 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 Depression 10/31/2016  . Diabetes mellitus (HCC)    dx date 2004 at age 70  . Low back pain 09/01/2016  . Visual snow syndrome    Review of Systems:   Review of Systems  Constitutional: Negative for diaphoresis and malaise/fatigue.  Respiratory: Negative for cough and shortness of breath.   Cardiovascular: Negative for chest pain and leg swelling.  Gastrointestinal: Negative for abdominal pain, nausea and vomiting.  Genitourinary: Negative for dysuria, frequency and urgency.  Neurological: Negative for dizziness.  Endo/Heme/Allergies: Negative for polydipsia.    Physical Exam: Constitution: NAD, appears stated age Cardio: RRR, no m/r/g, no LE edema  Respiratory: CTA, no w/r/r Abdominal: NTTP, soft, non-distended MSK: moving all extremities Neuro: flat affect, a&ox3 Skin: red, flaky skin over forehead and brow with flaking on scalp as well; dry, cracked flaking skin lower legs bilaterally    Vitals:   11/10/20 1445 11/10/20 1447  BP: (!) 85/63 98/69  Pulse: 73 73  Temp: 98.5 F (36.9 C)   TempSrc: Oral   SpO2: 98%   Weight: 248 lb 3.2 oz (112.6 kg)   Height: 5\' 11"  (1.803 m)     Assessment & Plan:   See Encounters Tab for problem based charting.  Patient discussed with Dr. 

## 2020-11-10 NOTE — Patient Instructions (Addendum)
Thank you for allowing [place] to provide your care today. Today we discussed your type II diabetes and rash (seborrheic dermatitis)   Please use the cream two times per day for no more than two weeks.  Use shampoo twice per week.   Please follow-up once you receive orange card for further work-up and labs. Try to follow-up in one month if rash does not improve.    Please call the internal medicine center clinic if you have any questions or concerns, we may be able to help and keep you from a long and expensive emergency room wait. Our clinic and after hours phone number is [phone], the best time to call is Monday through Friday 9 am to 4 pm but there is always someone available 24/7 if you have an emergency. If you need medication refills please notify your pharmacy one week in advance and they will send [place] a request.

## 2020-11-11 DIAGNOSIS — L219 Seborrheic dermatitis, unspecified: Secondary | ICD-10-CM | POA: Insufficient documentation

## 2020-11-11 NOTE — Assessment & Plan Note (Signed)
Waiting until he has orange card to obtain labs. Will need to recheck calcium.

## 2020-11-11 NOTE — Assessment & Plan Note (Signed)
Flaky, red rash over forehead and brow with dandruff of the hair as well. He also has dermatitis over the shins lower legs. Discussed medication for this, but he states using lotion helps his legs. Occasionally pruritic He is agreeable to treating the his scalp and face   - clotrimazole and low dose topical hydrocortisone - use bid  - ketoconazole shampoo x2 weekly  - needs HIV lab once he has orange card

## 2020-11-11 NOTE — Assessment & Plan Note (Signed)
He has not had anymore episodes of symptoms of hypoglycemia since switching to invokamet [phone] mg xr qd. A1c today is 5.8 from 6.5. He sees Dr. Dione Booze for his eye care.   - foot exam today  - continue invokamet [phone] mg qd  - f/u in three months for a1c

## 2020-11-11 NOTE — Assessment & Plan Note (Signed)
-   foot exam done today

## 2020-11-14 NOTE — Progress Notes (Signed)
Internal Medicine Clinic Attending  Case discussed with Dr. Seawell  At the time of the visit.  We reviewed the resident's history and exam and pertinent patient test results.  I agree with the assessment, diagnosis, and plan of care documented in the resident's note.  

## 2020-11-17 MED FILL — INVOKAMET XR 150-1,000 MG T: 150-1000 | 30 days supply | Qty: 60 | Fill #5

## 2020-11-29 ENCOUNTER — Ambulatory Visit: Payer: Self-pay

## 2020-12-20 ENCOUNTER — Other Ambulatory Visit: Payer: Self-pay | Admitting: Internal Medicine

## 2020-12-20 ENCOUNTER — Other Ambulatory Visit: Payer: Self-pay | Admitting: Student

## 2020-12-20 DIAGNOSIS — E1159 Type 2 diabetes mellitus with other circulatory complications: Secondary | ICD-10-CM

## 2020-12-20 MED ORDER — INVOKAMET XR 150-1000 MG PO TB24: 2.0000 | ORAL_TABLET | Freq: Every day | ORAL | 5 refills | Status: DC

## 2020-12-20 MED FILL — INVOKAMET XR 150-1,000 MG T: 150-1000 | 30 days supply | Qty: 60 | Fill #0

## 2021-02-08 ENCOUNTER — Ambulatory Visit (INDEPENDENT_AMBULATORY_CARE_PROVIDER_SITE_OTHER): Payer: Self-pay | Admitting: Internal Medicine

## 2021-02-08 ENCOUNTER — Other Ambulatory Visit: Payer: Self-pay

## 2021-02-08 VITALS — BP 96/72 | HR 75 | Ht 71.0 in | Wt 242.8 lb

## 2021-02-08 DIAGNOSIS — Z7984 Long term (current) use of oral hypoglycemic drugs: Secondary | ICD-10-CM

## 2021-02-08 DIAGNOSIS — E1159 Type 2 diabetes mellitus with other circulatory complications: Secondary | ICD-10-CM

## 2021-02-08 LAB — POCT GLYCOSYLATED HEMOGLOBIN (HGB A1C): Hemoglobin A1C: 5.8 % — AB (ref 4.0–5.6)

## 2021-02-08 LAB — GLUCOSE, CAPILLARY: Glucose-Capillary: 211 mg/dL — ABNORMAL HIGH (ref 70–99)

## 2021-02-08 NOTE — Patient Instructions (Addendum)
Thank you for allowing [place] to provide your care today. Today we discussed your     I have ordered hemoglobin a1c labs for you. I will call if any are abnormal.    Today we made no changes to your medications.    Please follow-up in 3 months.    Should you have any questions or concerns please call the internal medicine clinic at [phone].     Diabetes Mellitus and Foot Care Foot care is an important part of your health, especially when you have diabetes. Diabetes may cause you to have problems because of poor blood flow (circulation) to your feet and legs, which can cause your skin to:  Become thinner and drier.  Break more easily.  Heal more slowly.  Peel and crack. You may also have nerve damage (neuropathy) in your legs and feet, causing decreased feeling in them. This means that you may not notice minor injuries to your feet that could lead to more serious problems. Noticing and addressing any potential problems early is the best way to prevent future foot problems. How to care for your feet Foot hygiene  Wash your feet daily with warm water and mild soap. Do not use hot water. Then, pat your feet and the areas between your toes until they are completely dry. Do not soak your feet as this can dry your skin.  Trim your toenails straight across. Do not dig under them or around the cuticle. File the edges of your nails with an emery board or nail file.  Apply a moisturizing lotion or petroleum jelly to the skin on your feet and to dry, brittle toenails. Use lotion that does not contain alcohol and is unscented. Do not apply lotion between your toes.   Shoes and socks  Wear clean socks or stockings every day. Make sure they are not too tight. Do not wear knee-high stockings since they may decrease blood flow to your legs.  Wear shoes that fit properly and have enough cushioning. Always look in your shoes before you put them on to be sure there are no objects inside.  To break  in new shoes, wear them for just a few hours a day. This prevents injuries on your feet. Wounds, scrapes, corns, and calluses  Check your feet daily for blisters, cuts, bruises, sores, and redness. If you cannot see the bottom of your feet, use a mirror or ask someone for help.  Do not cut corns or calluses or try to remove them with medicine.  If you find a minor scrape, cut, or break in the skin on your feet, keep it and the skin around it clean and dry. You may clean these areas with mild soap and water. Do not clean the area with peroxide, alcohol, or iodine.  If you have a wound, scrape, corn, or callus on your foot, look at it several times a day to make sure it is healing and not infected. Check for: ? Redness, swelling, or pain. ? Fluid or blood. ? Warmth. ? Pus or a bad smell.   General tips  Do not cross your legs. This may decrease blood flow to your feet.  Do not use heating pads or hot water bottles on your feet. They may burn your skin. If you have lost feeling in your feet or legs, you may not know this is happening until it is too late.  Protect your feet from hot and cold by wearing shoes, such as at the beach  or on hot pavement.  Schedule a complete foot exam at least once a year (annually) or more often if you have foot problems. Report any cuts, sores, or bruises to your health care provider immediately. Where to find more information  American Diabetes Association: www.diabetes.org  Association of Diabetes Care & Education Specialists: www.diabeteseducator.org Contact a health care provider if:  You have a medical condition that increases your risk of infection and you have any cuts, sores, or bruises on your feet.  You have an injury that is not healing.  You have redness on your legs or feet.  You feel burning or tingling in your legs or feet.  You have pain or cramps in your legs and feet.  Your legs or feet are numb.  Your feet always feel  cold.  You have pain around any toenails. Get help right away if:  You have a wound, scrape, corn, or callus on your foot and: ? You have pain, swelling, or redness that gets worse. ? You have fluid or blood coming from the wound, scrape, corn, or callus. ? Your wound, scrape, corn, or callus feels warm to the touch. ? You have pus or a bad smell coming from the wound, scrape, corn, or callus. ? You have a fever. ? You have a red line going up your leg. Summary  Check your feet every day for blisters, cuts, bruises, sores, and redness.  Apply a moisturizing lotion or petroleum jelly to the skin on your feet and to dry, brittle toenails.  Wear shoes that fit properly and have enough cushioning.  If you have foot problems, report any cuts, sores, or bruises to your health care provider immediately.  Schedule a complete foot exam at least once a year (annually) or more often if you have foot problems. This information is not intended to replace advice given to you by your health care provider. Make sure you discuss any questions you have with your health care provider. Document Revised: 05/12/2020 Document Reviewed: 05/12/2020 Elsevier Patient Education  2021 [employer].

## 2021-02-08 NOTE — Progress Notes (Signed)
.     CC: Diabetes  HPI: Carl Conley is a 35 y.o. with PMH listed below presenting with complaint of diabetes. Please see problem based assessment and plan for further details.  Past Medical History:  Diagnosis Date  . ADHD   . 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 Depression 10/31/2016  . Diabetes mellitus (HCC)    dx date 2004 at age 89  . Low back pain 09/01/2016  . Visual snow syndrome    Review of Systems: Review of Systems  Constitutional: Negative for chills, fever and malaise/fatigue.  Eyes: Negative for blurred vision.  Respiratory: Negative for cough and shortness of breath.   Cardiovascular: Negative for chest pain, palpitations and leg swelling.  Gastrointestinal: Negative for constipation, diarrhea, nausea and vomiting.  Neurological: Negative for dizziness.  All other systems reviewed and are negative.   Physical Exam: Vitals:   02/08/21 1519  BP: 96/72  Pulse: 75  SpO2: 100%  Weight: 242 lb 12.8 oz (110.1 kg)  Height: 5\' 11"  (1.803 m)   Gen: Well-developed, well nourished, NAD HEENT: NCAT head, hearing intact CV: RRR, S1, S2 normal Pulm: CTAB, No rales, no wheezes Extm: ROM intact, Peripheral pulses intact, No peripheral edema Skin: Dry skin bilateral feet, no wound or ulcers  Assessment & Plan:   Diabetes [name] Lab Results  Component Value Date   HGBA1C 5.8 (A) 02/08/2021   CarlConley is a 35 yo M w/ PMH of DM, NAFLD, ADHD presenting to [facility] for management of his chronic conditions. He has been taking his Invokamet as prescribed without significant side effects. He denies any nausea, vomiting, polyuria, dysuria, polydipsia. Diarrhea, constipation. He mentions examining his foot regularly and has not noticed significant wounds or ulcers.  - C/w canagliflozin-metformin 300-2000mg  daily - F/u in 3 months   Patient discussed with Dr. [facility]  -[name], PGY3 [facility] Internal Medicine Pager: [phone]

## 2021-02-09 ENCOUNTER — Encounter: Payer: Self-pay | Admitting: Internal Medicine

## 2021-02-09 NOTE — Assessment & Plan Note (Addendum)
Lab Results  Component Value Date   HGBA1C 5.8 (A) 02/08/2021   Carl Conley is a 35 yo M w/ PMH of DM, NAFLD, ADHD presenting to [facility] for management of his chronic conditions. He has been taking his Invokamet as prescribed without significant side effects. He denies any nausea, vomiting, polyuria, dysuria, polydipsia. Diarrhea, constipation. He mentions examining his foot regularly and has not noticed significant wounds or ulcers.  - C/w canagliflozin-metformin 300-2000mg  daily - F/u in 3 months

## 2021-02-10 NOTE — Progress Notes (Signed)
Internal Medicine Clinic Attending  Case discussed with Dr. Lee  At the time of the visit.  We reviewed the resident's history and exam and pertinent patient test results.  I agree with the assessment, diagnosis, and plan of care documented in the resident's note.    

## 2021-02-20 ENCOUNTER — Other Ambulatory Visit (HOSPITAL_COMMUNITY): Payer: Self-pay

## 2021-02-20 MED FILL — Canagliflozin-Metformin HCl Tab ER 24HR 150-1000 MG: ORAL | 30 days supply | Qty: 60 | Fill #0 | Status: AC

## 2021-03-04 ENCOUNTER — Encounter: Payer: Self-pay | Admitting: Internal Medicine

## 2021-03-07 ENCOUNTER — Ambulatory Visit (INDEPENDENT_AMBULATORY_CARE_PROVIDER_SITE_OTHER): Payer: Self-pay

## 2021-03-07 ENCOUNTER — Other Ambulatory Visit: Payer: Self-pay

## 2021-03-07 ENCOUNTER — Encounter (HOSPITAL_COMMUNITY): Payer: Self-pay | Admitting: Emergency Medicine

## 2021-03-07 ENCOUNTER — Ambulatory Visit (HOSPITAL_COMMUNITY)
Admission: EM | Admit: 2021-03-07 | Discharge: 2021-03-07 | Disposition: A | Discharge status: Home / Self Care | Payer: Self-pay | Attending: Internal Medicine | Admitting: Internal Medicine

## 2021-03-07 DIAGNOSIS — R0781 Pleurodynia: Secondary | ICD-10-CM

## 2021-03-07 DIAGNOSIS — R1011 Right upper quadrant pain: Secondary | ICD-10-CM

## 2021-03-07 DIAGNOSIS — J8410 Pulmonary fibrosis, unspecified: Secondary | ICD-10-CM

## 2021-03-07 LAB — POCT URINALYSIS DIPSTICK, ED / UC
Bilirubin Urine: NEGATIVE
Glucose, UA: >=1000 mg/dL — AB
Hgb urine dipstick: NEGATIVE
Ketones, ur: NEGATIVE mg/dL
Leukocytes,Ua: NEGATIVE
Nitrite: NEGATIVE
Protein, ur: NEGATIVE mg/dL
Specific Gravity, Urine: 1.01 (ref 1.005–1.030)
Urobilinogen, UA: 0.2 mg/dL (ref 0.0–1.0)
pH: 5 (ref 5.0–8.0)

## 2021-03-07 MED ORDER — NAPROXEN 375 MG PO TABS: 375.0000 mg | ORAL_TABLET | Freq: Two times a day (BID) | ORAL | 0 refills | Status: DC

## 2021-03-07 NOTE — ED Provider Notes (Signed)
[facility]    CSN: [id number] Arrival date & time: 03/07/21  1431      History   Chief Complaint Chief Complaint  Patient presents with  . Abdominal Pain    RUQ    HPI Carl Conley is a 35 y.o. male.   Patient presents today with a several day history of right rib pain.  Patient reports he was bending over to get something out of his car when he felt a snap and has had persistent pain since that time.  Pain is localized to left lateral inferior ribs with radiation into abdomen, described as aching with periodic sharp pains, worse with certain movements, no alleviating factors identified.  He has taken ibuprofen without improvement of symptoms.  Reports pain is rated 7 on a 0-10 pain scale.  He denies any associated nausea, vomiting, melena, hematochezia, fever, shortness of breath, chest pain.  He denies history of gastrointestinal disorder.  Denies history of osteoporosis but does have history of vitamin D deficiency and is not currently taking any vitamin D supplement.     Past Medical History:  Diagnosis Date  . ADHD   . 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 Depression 10/31/2016  . Diabetes mellitus (HCC)    dx date 2004 at age 13  . Low back pain 09/01/2016  . Visual snow syndrome     Patient Active Problem List   Diagnosis Date Noted  . Seborrheic dermatitis 11/11/2020  . Hypercalcemia 11/11/2020  . Tension headache 09/10/2019  . NAFLD (nonalcoholic fatty liver disease) 82/95/6213  . ADHD   . Visual disturbance 10/31/2016  . Depression 10/31/2016  . Diabetes (HCC) 04/13/2016    Past Surgical History:  Procedure Laterality Date  . WISDOM TOOTH EXTRACTION         Home Medications    Prior to Admission medications   Medication Sig Start Date End Date Taking? Authorizing Provider  naproxen (NAPROSYN) 375 MG tablet Take 1 tablet (375 mg total) by mouth 2 (two) times daily. 03/07/21  Yes Andretta Ergle K, PA-C  Canagliflozin-metFORMIN HCl ER  [phone] MG TB24 TAKE 2 TABLETS BY MOUTH DAILY. 12/20/20   Yvette Rack, MD  citalopram (CELEXA) 20 MG tablet Take 1 tablet (20 mg total) by mouth daily. 11/02/20   Theotis Barrio, MD  hydrocortisone cream 1 % Apply to affected area 2 times daily for two weeks 11/10/20 11/10/21  Seawell, Richmond Campbell A, DO  ketoconazole (NIZORAL) 2 % cream Apply 1 application topically 2 (two) times daily. 11/10/20   Seawell, Jaimie A, DO  ketoconazole (NIZORAL) 2 % shampoo Apply 1 application topically 2 (two) times a week. 11/10/20   Seawell, Jaimie A, DO  silver sulfADIAZINE (SILVADENE) 1 % cream Apply 1 application topically daily. 04/07/18   Vivi Barrack, DPM    Family History Family History  Problem Relation Age of Onset  . Depression Mother     Social History Social History   Tobacco Use  . Smoking status: Never Smoker  . Smokeless tobacco: Never Used  Vaping Use  . Vaping Use: Never used  Substance Use Topics  . Alcohol use: No  . Drug use: No     Allergies   Patient has no known allergies.   Review of Systems Review of Systems  Constitutional: Negative for activity change, appetite change, fatigue and fever.  Respiratory: Negative for cough and shortness of breath.   Cardiovascular: Positive for chest pain (right lateral chest wall).  Gastrointestinal:  Positive for abdominal pain. Negative for blood in stool, constipation, diarrhea, nausea and vomiting.  Musculoskeletal: Negative for arthralgias and myalgias.  Neurological: Negative for dizziness, light-headedness and headaches.     Physical Exam Triage Vital Signs ED Triage Vitals  Enc Vitals Group     BP 03/07/21 1556 110/75     Pulse Rate 03/07/21 1556 75     Resp 03/07/21 1556 20     Temp 03/07/21 1556 98.9 F (37.2 C)     Temp Source 03/07/21 1556 Oral     SpO2 03/07/21 1556 100 %     Weight --      Height --      Head Circumference --      Peak Flow --      Pain Score 03/07/21 1554 7     Pain Loc --      Pain Edu? --       Excl. in GC? --    No data found.  Updated Vital Signs BP 110/75 (BP Location: Right Arm)   Pulse 75   Temp 98.9 F (37.2 C) (Oral)   Resp 20   SpO2 100%   Visual Acuity Right Eye Distance:   Left Eye Distance:   Bilateral Distance:    Right Eye Near:   Left Eye Near:    Bilateral Near:     Physical Exam Vitals reviewed.  Constitutional:      General: He is awake.     Appearance: Normal appearance. He is normal weight. He is not ill-appearing.     Comments: Very pleasant male appears stated age in no acute distress  HENT:     Head: Normocephalic and atraumatic.  Cardiovascular:     Rate and Rhythm: Normal rate and regular rhythm.     Heart sounds: No murmur heard.   Pulmonary:     Effort: Pulmonary effort is normal.     Breath sounds: Normal breath sounds. No stridor. No wheezing, rhonchi or rales.     Comments: Clear to auscultation bilaterally Chest:     Chest wall: Swelling and tenderness present. No deformity.     Comments: Swelling and tenderness over right lateral inferior ribs.  No deformity noted. Abdominal:     General: Bowel sounds are normal.     Palpations: Abdomen is soft.     Tenderness: There is abdominal tenderness in the right upper quadrant. There is no right CVA tenderness, left CVA tenderness, guarding or rebound. Negative signs include Murphy's sign.     Comments: Mild tenderness palpation in right lateral upper quadrant.  No CVA tenderness.  Negative Murphy sign.  No evidence of acute abdomen on physical exam.  Musculoskeletal:     Cervical back: Normal range of motion and neck supple.  Neurological:     Mental Status: He is alert.  Psychiatric:        Behavior: Behavior is cooperative.      UC Treatments / Results  Labs (all labs ordered are listed, but only abnormal results are displayed) Labs Reviewed  POCT URINALYSIS DIPSTICK, ED / UC - Abnormal; Notable for the following components:      Result Value   Glucose, UA >=1000  (*)    All other components within normal limits    EKG   Radiology DG Ribs Unilateral W/Chest Right  Result Date: 03/07/2021 CLINICAL DATA:  Pain after sudden motion EXAM: RIGHT RIBS AND CHEST - 3+ VIEW COMPARISON:  None. FINDINGS: Frontal chest as well as oblique  and cone-down rib images obtained. There is a small calcified granuloma in the left base. The lungs elsewhere are clear. The heart size and pulmonary parity are normal. No evident adenopathy. No pleural effusion or pneumothorax.  No appreciable rib fracture. IMPRESSION: No evident rib fracture. No pneumothorax or pleural effusion. Lungs clear except for small granuloma left base. Electronically Signed   By: Bretta Bang III M.D.   On: 03/07/2021 16:48    Procedures Procedures (including critical care time)  Medications Ordered in UC Medications - No data to display  Initial Impression / Assessment and Plan / UC Course  I have reviewed the triage vital signs and the nursing notes.  Pertinent labs & imaging results that were available during my care of the patient were reviewed by me and considered in my medical decision making (see chart for details).     Glucose showed glucosuria related to SGLT2 inhibitor use.  No hematuria noted.  Given focal tenderness over ribs x-ray was ordered that showed acute findings to explain patient's pain.  Concern for muscle strain given tenderness palpation on exam and pain with movement.  Will start anti-inflammatories patient was noted not to take additional NSAIDs with this medication due to risk of GI bleeding.  He can take Tylenol for breakthrough pain.  Recommended heat, rest, stretch.  Strict return precautions given to which patient expressed understanding.  Incidental lung granuloma noted on x-ray.  Discussed this is typically related to previous infection or inflammation.  Patient is currently asymptomatic so encouraged him to follow-up with PCP.  Strict return precautions given to  which patient expressed understanding.  Final Clinical Impressions(s) / UC Diagnoses   Final diagnoses:  Rib pain on right side  Abdominal pain, right upper quadrant  Lung granuloma (HCC)     Discharge Instructions     I believe you have a muscle strain given your x-ray did not show any fractures.  We will use Naprosyn which is an anti-inflammatory medication.  You should take this twice a day with food.  You should not take additional NSAIDs including aspirin, ibuprofen/Advil, naproxen/Aleve with this medication due to risk of stomach bleeding.  Use heat, rest, stretch for additional symptom relief.  If you have any worsening symptoms you need to return for reevaluation.  Your x-ray did not show something called a lung granuloma which can be related to previous infection or inflammation.  Given you are not having any symptoms we do not need to do anything about it now but I do recommend you follow-up with your primary care.  If you develop any symptoms including shortness of breath, cough, fever please return for reevaluation.    ED Prescriptions    Medication Sig Dispense Auth. Provider   naproxen (NAPROSYN) 375 MG tablet Take 1 tablet (375 mg total) by mouth 2 (two) times daily. 20 tablet Caydee Talkington, Noberto Retort, PA-C     PDMP not reviewed this encounter.   Jeani Hawking, PA-C 03/07/21 1703

## 2021-03-07 NOTE — ED Triage Notes (Signed)
Pt presents today with c/o of RUQ abd pain x 5 days that increases with movement. Denies n/v/d.

## 2021-03-07 NOTE — Discharge Instructions (Signed)
I believe you have a muscle strain given your x-ray did not show any fractures.  We will use Naprosyn which is an anti-inflammatory medication.  You should take this twice a day with food.  You should not take additional NSAIDs including aspirin, ibuprofen/Advil, naproxen/Aleve with this medication due to risk of stomach bleeding.  Use heat, rest, stretch for additional symptom relief.  If you have any worsening symptoms you need to return for reevaluation.  Your x-ray did not show something called a lung granuloma which can be related to previous infection or inflammation.  Given you are not having any symptoms we do not need to do anything about it now but I do recommend you follow-up with your primary care.  If you develop any symptoms including shortness of breath, cough, fever please return for reevaluation.

## 2021-03-08 ENCOUNTER — Encounter: Payer: Self-pay | Admitting: Student

## 2021-03-19 ENCOUNTER — Encounter (HOSPITAL_COMMUNITY): Payer: Self-pay | Admitting: Emergency Medicine

## 2021-03-19 ENCOUNTER — Emergency Department (HOSPITAL_COMMUNITY)
Admission: EM | Admit: 2021-03-19 | Discharge: 2021-03-20 | Disposition: A | Discharge status: Home / Self Care | Payer: No Typology Code available for payment source | Attending: Emergency Medicine | Admitting: Emergency Medicine

## 2021-03-19 ENCOUNTER — Emergency Department (HOSPITAL_COMMUNITY): Payer: No Typology Code available for payment source

## 2021-03-19 ENCOUNTER — Other Ambulatory Visit: Payer: Self-pay

## 2021-03-19 DIAGNOSIS — S32019A Unspecified fracture of first lumbar vertebra, initial encounter for closed fracture: Secondary | ICD-10-CM | POA: Diagnosis not present

## 2021-03-19 DIAGNOSIS — E119 Type 2 diabetes mellitus without complications: Secondary | ICD-10-CM | POA: Diagnosis not present

## 2021-03-19 DIAGNOSIS — S32010A Wedge compression fracture of first lumbar vertebra, initial encounter for closed fracture: Secondary | ICD-10-CM

## 2021-03-19 DIAGNOSIS — Y92410 Unspecified street and highway as the place of occurrence of the external cause: Secondary | ICD-10-CM | POA: Diagnosis not present

## 2021-03-19 DIAGNOSIS — Z7984 Long term (current) use of oral hypoglycemic drugs: Secondary | ICD-10-CM | POA: Diagnosis not present

## 2021-03-19 DIAGNOSIS — S62101A Fracture of unspecified carpal bone, right wrist, initial encounter for closed fracture: Secondary | ICD-10-CM

## 2021-03-19 DIAGNOSIS — S52321A Displaced transverse fracture of shaft of right radius, initial encounter for closed fracture: Secondary | ICD-10-CM | POA: Diagnosis not present

## 2021-03-19 DIAGNOSIS — S3992XA Unspecified injury of lower back, initial encounter: Secondary | ICD-10-CM | POA: Diagnosis present

## 2021-03-19 DIAGNOSIS — M549 Dorsalgia, unspecified: Secondary | ICD-10-CM

## 2021-03-19 DIAGNOSIS — S3991XA Unspecified injury of abdomen, initial encounter: Secondary | ICD-10-CM

## 2021-03-19 MED ORDER — KETOROLAC TROMETHAMINE 60 MG/2ML IM SOLN
60.0000 mg | Freq: Once | INTRAMUSCULAR | Status: AC
  Administered 2021-03-20: 60 mg via INTRAMUSCULAR
  Filled 2021-03-19: qty 2

## 2021-03-19 MED ORDER — OXYCODONE-ACETAMINOPHEN 5-325 MG PO TABS
1.0000 | ORAL_TABLET | Freq: Once | ORAL | Status: AC
Start: 2021-03-20 — End: 2021-03-20
  Administered 2021-03-20: 1 via ORAL
  Filled 2021-03-19: qty 1
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
## 2021-03-20 ENCOUNTER — Emergency Department (HOSPITAL_COMMUNITY): Payer: No Typology Code available for payment source

## 2021-03-20 LAB — CBG MONITORING, ED: Glucose-Capillary: 166 mg/dL — ABNORMAL HIGH (ref 70–99)

## 2021-03-20 MED ORDER — ONDANSETRON 4 MG PO TBDP
4.0000 mg | ORAL_TABLET | Freq: Once | ORAL | Status: AC
  Administered 2021-03-20: 4 mg via ORAL
  Filled 2021-03-20: qty 1

## 2021-03-20 MED ORDER — HYDROMORPHONE HCL 1 MG/ML IJ SOLN
1.0000 mg | Freq: Once | INTRAMUSCULAR | Status: AC
  Administered 2021-03-20: 1 mg via INTRAMUSCULAR
  Filled 2021-03-20: qty 1

## 2021-03-20 MED ORDER — OXYCODONE-ACETAMINOPHEN 5-325 MG PO TABS: 1.0000 | ORAL_TABLET | Freq: Four times a day (QID) | ORAL | 0 refills | Status: DC | PRN

## 2021-03-20 NOTE — Discharge Instructions (Addendum)
Follow up with dr. Izora Ribas either this week or next for your wrist.   Follow up with dr. Alphonzo Lemmings for your back injury

## 2021-03-20 NOTE — ED Notes (Signed)
Pt is back from x-ray

## 2021-03-20 NOTE — ED Notes (Signed)
Pt asked for food, states that he has not eaten in over 12 hours, went to get him some snacks and before I could given them pt vomited again, greenish emesis.  CBG checked and gave him diet ginger ale and saltines and PB and graham crackers.

## 2021-03-20 NOTE — Progress Notes (Signed)
Orthopedic Tech Progress Note Patient Details:  Carl Conley 01/28/1986 [id number]  Patient ID: Carl Conley, male   DOB: 06/12/1986, 35 y.o.   MRN: [id number]   [name] 03/20/2021, 9:25 AM Called [facility] for TLSO brace.
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
## 2021-03-20 NOTE — ED Notes (Signed)
TLSO brace applied by ortho team, pt able to ambulate w steady gait, went over dc papers and follow-up, rx sent to pharmacy, mother at bedside, driving pt home

## 2021-03-20 NOTE — ED Notes (Signed)
Ortho tech was called and message was left

## 2021-03-20 NOTE — ED Notes (Signed)
Ortho tech at bedside 

## 2021-03-20 NOTE — ED Notes (Signed)
Pt reports that he needs to have a BM and is requesting bedpan.  Offered pt to walk with him and also offered to get WC.  Pt will wait to have pain medication work before getting up

## 2021-03-20 NOTE — ED Notes (Signed)
Called ortho tech for TLSO brace, she stated she would call [facility] to place order and they will hopefully be here in around an hour to apply it

## 2021-03-20 NOTE — ED Notes (Signed)
Helped pt to bathroom to have BM.  Pt vomited clear liquid when in bathroom and soiled clothing.  Paper scrubs given and helped pt back to room

## 2021-03-20 NOTE — Progress Notes (Signed)
Orthopedic Tech Progress Note Patient Details:  Carl Conley 06/12/1986 [id number]  Ortho Devices Type of Ortho Device: Arm sling,Sugartong splint Ortho Device/Splint Location: rue Ortho Device/Splint Interventions: Ordered,Application,Adjustment   Post Interventions Patient Tolerated: Well Instructions Provided: Care of device,Adjustment of device   Trinna Post 03/20/2021, 4:41 AM

## 2021-03-24 ENCOUNTER — Encounter: Payer: Self-pay | Admitting: Internal Medicine

## 2021-03-28 ENCOUNTER — Other Ambulatory Visit (HOSPITAL_COMMUNITY): Payer: Self-pay

## 2021-03-28 MED FILL — Canagliflozin-Metformin HCl Tab ER 24HR 150-1000 MG: ORAL | 30 days supply | Qty: 60 | Fill #1 | Status: AC

## 2021-04-26 ENCOUNTER — Other Ambulatory Visit (HOSPITAL_COMMUNITY): Payer: Self-pay

## 2021-04-26 MED FILL — Canagliflozin-Metformin HCl Tab ER 24HR 150-1000 MG: ORAL | 30 days supply | Qty: 60 | Fill #2 | Status: AC

## 2021-05-01 ENCOUNTER — Other Ambulatory Visit: Payer: Self-pay | Admitting: Internal Medicine

## 2021-05-01 DIAGNOSIS — F32A Depression, unspecified: Secondary | ICD-10-CM

## 2021-05-09 ENCOUNTER — Encounter: Payer: Self-pay | Admitting: *Deleted

## 2021-05-29 ENCOUNTER — Other Ambulatory Visit (HOSPITAL_COMMUNITY): Payer: Self-pay

## 2021-05-29 MED FILL — Canagliflozin-Metformin HCl Tab ER 24HR 150-1000 MG: ORAL | 30 days supply | Qty: 60 | Fill #3 | Status: AC

## 2021-06-14 ENCOUNTER — Other Ambulatory Visit: Payer: Self-pay | Admitting: Internal Medicine

## 2021-06-14 DIAGNOSIS — L219 Seborrheic dermatitis, unspecified: Secondary | ICD-10-CM

## 2021-06-30 ENCOUNTER — Other Ambulatory Visit: Payer: Self-pay | Admitting: Internal Medicine

## 2021-06-30 ENCOUNTER — Other Ambulatory Visit (HOSPITAL_COMMUNITY): Payer: Self-pay

## 2021-06-30 DIAGNOSIS — E1159 Type 2 diabetes mellitus with other circulatory complications: Secondary | ICD-10-CM

## 2021-07-03 ENCOUNTER — Other Ambulatory Visit: Payer: Self-pay | Admitting: Internal Medicine

## 2021-07-03 ENCOUNTER — Other Ambulatory Visit (HOSPITAL_COMMUNITY): Payer: Self-pay

## 2021-07-03 DIAGNOSIS — E1159 Type 2 diabetes mellitus with other circulatory complications: Secondary | ICD-10-CM

## 2021-07-04 ENCOUNTER — Other Ambulatory Visit (HOSPITAL_COMMUNITY): Payer: Self-pay

## 2021-07-04 ENCOUNTER — Other Ambulatory Visit: Payer: Self-pay | Admitting: Student

## 2021-07-04 DIAGNOSIS — E1159 Type 2 diabetes mellitus with other circulatory complications: Secondary | ICD-10-CM

## 2021-07-04 MED ORDER — INVOKAMET XR 150-1000 MG PO TB24
2.0000 | ORAL_TABLET | Freq: Every day | ORAL | 5 refills | Status: DC
  Filled 2021-07-04: qty 60, 30d supply, fill #0

## 2021-07-06 ENCOUNTER — Telehealth: Payer: Self-pay

## 2021-07-06 ENCOUNTER — Other Ambulatory Visit (HOSPITAL_COMMUNITY): Payer: Self-pay

## 2021-07-06 NOTE — Telephone Encounter (Signed)
#  60 with 5 refills sent to [facility] on 8/30. Confirmed with Helmut Muster at [facility] that Rx has been ready for p/u since 8/30 at $4. Patient notified that Rx is ready and please call pharmacy first for refills. He was appreciative.

## 2021-07-06 NOTE — Telephone Encounter (Signed)
Canagliflozin-metFORMIN HCl ER (INVOKAMET XR) [phone] MG TB24, refill request @ [facility].

## 2021-07-17 ENCOUNTER — Other Ambulatory Visit: Payer: Self-pay

## 2021-07-17 ENCOUNTER — Other Ambulatory Visit (HOSPITAL_COMMUNITY): Payer: Self-pay

## 2021-07-17 ENCOUNTER — Encounter: Payer: Self-pay | Admitting: Internal Medicine

## 2021-07-17 ENCOUNTER — Ambulatory Visit (INDEPENDENT_AMBULATORY_CARE_PROVIDER_SITE_OTHER): Payer: Self-pay | Admitting: Internal Medicine

## 2021-07-17 VITALS — BP 101/68 | HR 76 | Temp 98.9°F | Ht 71.0 in | Wt 247.0 lb

## 2021-07-17 DIAGNOSIS — E1159 Type 2 diabetes mellitus with other circulatory complications: Secondary | ICD-10-CM

## 2021-07-17 DIAGNOSIS — Z0000 Encounter for general adult medical examination without abnormal findings: Secondary | ICD-10-CM | POA: Insufficient documentation

## 2021-07-17 DIAGNOSIS — K760 Fatty (change of) liver, not elsewhere classified: Secondary | ICD-10-CM

## 2021-07-17 LAB — GLUCOSE, CAPILLARY: Glucose-Capillary: 305 mg/dL — ABNORMAL HIGH (ref 70–99)

## 2021-07-17 LAB — POCT GLYCOSYLATED HEMOGLOBIN (HGB A1C): Hemoglobin A1C: 6.6 % — AB (ref 4.0–5.6)

## 2021-07-17 MED ORDER — INVOKAMET XR 150-1000 MG PO TB24
2.0000 | ORAL_TABLET | Freq: Every day | ORAL | 3 refills | Status: DC
  Filled 2021-07-17 – 2021-08-04 (×2): qty 60, 30d supply, fill #0
  Filled 2021-09-05: qty 60, 30d supply, fill #1
  Filled 2021-10-03: qty 60, 30d supply, fill #2
  Filled 2021-11-03: qty 60, 30d supply, fill #3
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
## 2021-07-17 NOTE — Progress Notes (Signed)
   CC: diabetes follow up   HPI:  [name] is a 35 y.o. male with diabetes, NAFLD, and depression who is presenting to the [facility] for follow up of his diabetes. Please see problem-based list for further details, assessments, and plans.   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   Depression 10/31/2016   Diabetes mellitus (HCC)    dx date 2004 at age 62   Low back pain 09/01/2016   Visual snow syndrome    Review of Systems:  Review of Systems  Constitutional:  Negative for chills and fever.  HENT: Negative.    Eyes:  Negative for blurred vision and double vision.  Respiratory:  Negative for shortness of breath and wheezing.   Cardiovascular:  Negative for chest pain and palpitations.  Gastrointestinal:  Negative for diarrhea, nausea and vomiting.  Genitourinary:  Negative for dysuria, frequency and urgency.  Musculoskeletal:  Positive for back pain.  Neurological:  Negative for dizziness and headaches.    Physical Exam:  Vitals:   07/17/21 1327  BP: 101/68  Pulse: 76  Temp: 98.9 F (37.2 C)  TempSrc: Oral  SpO2: 98%  Weight: 247 lb (112 kg)  Height: 5\' 11"  (1.803 m)   General: No acute distress. CV: RRR. No murmurs, rubs, or gallops. No LE edema Pulmonary: Lungs CTAB. Normal effort. No wheezing or rales. Abdominal: Soft, nontender, nondistended. Normal bowel sounds. Extremities/MSK: Palpable radial and DP pulses. Normal ROM. Able to ambulate, however, this is limited since his T11/T12 fracture. Neuro: A&Ox3. Moves all extremities.  Psych: Normal mood and affect   Assessment & Plan:   See Encounters Tab for problem based charting.  Patient seen with Dr. 01-03-2005

## 2021-07-17 NOTE — Assessment & Plan Note (Signed)
Checking CMP today. Patient was curious about getting an ultrasound of his liver done, however, we discussed that we will check labs first and will get an ultrasound if there are any changes to his liver function.  Plan: - CMP today

## 2021-07-17 NOTE — Assessment & Plan Note (Signed)
-   Patient will get flu shot at local pharmacy

## 2021-07-17 NOTE — Patient Instructions (Signed)
Thank you, Fransisco Beau for allowing [place] to provide your care today. Today we discussed: Diabetes: Continue to take Invokamet 2 tablets by mouth everyday. We are checking your kidney function today and will have you follow up in 6 months to recheck your sugar Fatty liver: We are also checking your liver function today. If there are abnormal results, I will let you know. We do not need to order an ultrasound of your liver at this time Healthcare maintenance: Be sure to get your flu shot at your local pharmacy!    I have ordered the following labs for you:   Lab Orders         Microalbumin / Creatinine Urine Ratio         Glucose, capillary         CMP14 + Anion Gap         POC Hbg A1C        Referrals ordered today:   Referral Orders  No referral(s) requested today     I have ordered the following medication/changed the following medications:   Stop the following medications: Medications Discontinued During This Encounter  Medication Reason   Canagliflozin-metFORMIN HCl ER (INVOKAMET XR) [phone] MG TB24 Reorder     Start the following medications: Meds ordered this encounter  Medications   Canagliflozin-metFORMIN HCl ER (INVOKAMET XR) [phone] MG TB24    Sig: TAKE 2 TABLETS BY MOUTH DAILY.    Dispense:  60 tablet    Refill:  3    [facility]     Follow up: 6 months     Should you have any questions or concerns please call the internal medicine clinic at [phone].     Elza Rafter, D.O. [facility]

## 2021-07-18 LAB — CMP14 + ANION GAP
ALT: 51 IU/L — ABNORMAL HIGH (ref 0–44)
AST: 30 IU/L (ref 0–40)
Albumin/Globulin Ratio: 2 (ref 1.2–2.2)
Albumin: 4.9 g/dL (ref 4.0–5.0)
Alkaline Phosphatase: 108 IU/L (ref 44–121)
Anion Gap: 23 mmol/L — ABNORMAL HIGH (ref 10.0–18.0)
BUN/Creatinine Ratio: 15 (ref 9–20)
BUN: 16 mg/dL (ref 6–20)
Bilirubin Total: 0.7 mg/dL (ref 0.0–1.2)
CO2: 19 mmol/L — ABNORMAL LOW (ref 20–29)
Calcium: 10.5 mg/dL — ABNORMAL HIGH (ref 8.7–10.2)
Chloride: 96 mmol/L (ref 96–106)
Creatinine, Ser: 1.05 mg/dL (ref 0.76–1.27)
Globulin, Total: 2.5 g/dL (ref 1.5–4.5)
Glucose: 269 mg/dL — ABNORMAL HIGH (ref 65–99)
Potassium: 4.7 mmol/L (ref 3.5–5.2)
Sodium: 138 mmol/L (ref 134–144)
Total Protein: 7.4 g/dL (ref 6.0–8.5)
eGFR: 95 mL/min/{1.73_m2} (ref >59)

## 2021-07-18 LAB — MICROALBUMIN / CREATININE URINE RATIO
Creatinine, Urine: 42.7 mg/dL
Microalb/Creat Ratio: <7 mg/g creat (ref 0–29)
Microalbumin, Urine: <3 ug/mL

## 2021-07-18 NOTE — Progress Notes (Signed)
Internal Medicine Clinic Attending ° °I saw and evaluated the patient.  I personally confirmed the key portions of the history and exam documented by Dr. Atway and I reviewed pertinent patient test results.  The assessment, diagnosis, and plan were formulated together and I agree with the documentation in the resident’s note.  °

## 2021-08-04 ENCOUNTER — Other Ambulatory Visit (HOSPITAL_COMMUNITY): Payer: Self-pay

## 2021-09-05 ENCOUNTER — Other Ambulatory Visit (HOSPITAL_COMMUNITY): Payer: Self-pay

## 2021-10-03 ENCOUNTER — Other Ambulatory Visit (HOSPITAL_COMMUNITY): Payer: Self-pay

## 2021-11-03 ENCOUNTER — Other Ambulatory Visit (HOSPITAL_COMMUNITY): Payer: Self-pay

## 2021-11-10 ENCOUNTER — Other Ambulatory Visit: Payer: Self-pay | Admitting: Student

## 2021-11-10 DIAGNOSIS — F32A Depression, unspecified: Secondary | ICD-10-CM

## 2021-12-13 ENCOUNTER — Other Ambulatory Visit: Payer: Self-pay | Admitting: Internal Medicine

## 2021-12-13 ENCOUNTER — Other Ambulatory Visit (HOSPITAL_COMMUNITY): Payer: Self-pay

## 2021-12-13 DIAGNOSIS — E1159 Type 2 diabetes mellitus with other circulatory complications: Secondary | ICD-10-CM

## 2021-12-13 MED ORDER — INVOKAMET XR 150-1000 MG PO TB24
2.0000 | ORAL_TABLET | Freq: Every day | ORAL | 3 refills | Status: DC
  Filled 2021-12-13: qty 60, 30d supply, fill #0
  Filled 2022-01-09: qty 60, 30d supply, fill #1
  Filled 2022-02-21: qty 60, 30d supply, fill #2
  Filled 2022-03-28: qty 60, 30d supply, fill #3

## 2022-01-09 ENCOUNTER — Other Ambulatory Visit (HOSPITAL_COMMUNITY): Payer: Self-pay

## 2022-02-21 ENCOUNTER — Other Ambulatory Visit (HOSPITAL_COMMUNITY): Payer: Self-pay

## 2022-03-28 ENCOUNTER — Other Ambulatory Visit (HOSPITAL_COMMUNITY): Payer: Self-pay

## 2022-03-28 ENCOUNTER — Other Ambulatory Visit: Payer: Self-pay | Admitting: Student

## 2022-03-28 DIAGNOSIS — E1159 Type 2 diabetes mellitus with other circulatory complications: Secondary | ICD-10-CM

## 2022-03-29 ENCOUNTER — Other Ambulatory Visit (HOSPITAL_COMMUNITY): Payer: Self-pay

## 2022-03-29 MED ORDER — INVOKAMET XR 150-1000 MG PO TB24
2.0000 | ORAL_TABLET | Freq: Every day | ORAL | 3 refills | Status: DC
  Filled 2022-03-29: qty 90, 45d supply, fill #0
  Filled 2022-04-30: qty 60, 30d supply, fill #0
  Filled 2022-05-30: qty 60, 30d supply, fill #1
  Filled 2022-07-02: qty 60, 30d supply, fill #2
  Filled 2022-07-30: qty 60, 30d supply, fill #3
  Filled 2022-09-04: qty 60, 30d supply, fill #4
  Filled 2022-09-28: qty 60, 30d supply, fill #5

## 2022-04-24 ENCOUNTER — Other Ambulatory Visit: Payer: Self-pay | Admitting: Internal Medicine

## 2022-04-24 DIAGNOSIS — F32A Depression, unspecified: Secondary | ICD-10-CM

## 2022-04-30 ENCOUNTER — Other Ambulatory Visit (HOSPITAL_COMMUNITY): Payer: Self-pay

## 2022-05-30 ENCOUNTER — Other Ambulatory Visit (HOSPITAL_COMMUNITY): Payer: Self-pay

## 2022-07-03 ENCOUNTER — Other Ambulatory Visit (HOSPITAL_COMMUNITY): Payer: Self-pay

## 2022-07-30 ENCOUNTER — Other Ambulatory Visit (HOSPITAL_COMMUNITY): Payer: Self-pay

## 2022-09-04 ENCOUNTER — Other Ambulatory Visit (HOSPITAL_COMMUNITY): Payer: Self-pay

## 2022-10-01 ENCOUNTER — Other Ambulatory Visit (HOSPITAL_COMMUNITY): Payer: Self-pay

## 2022-10-07 ENCOUNTER — Other Ambulatory Visit: Payer: Self-pay | Admitting: Student

## 2022-10-07 DIAGNOSIS — F32A Depression, unspecified: Secondary | ICD-10-CM

## 2022-10-08 NOTE — Telephone Encounter (Signed)
Can we please schedule Carl Conley for a follow up appointment in the clinic? Thank you

## 2022-11-01 ENCOUNTER — Ambulatory Visit: Payer: Self-pay | Admitting: Student

## 2022-11-01 ENCOUNTER — Other Ambulatory Visit (HOSPITAL_COMMUNITY): Payer: Self-pay

## 2022-11-01 VITALS — BP 94/64 | HR 89 | Temp 98.5°F | Ht 71.0 in | Wt 225.3 lb

## 2022-11-01 DIAGNOSIS — E1159 Type 2 diabetes mellitus with other circulatory complications: Secondary | ICD-10-CM | POA: Diagnosis present

## 2022-11-01 DIAGNOSIS — S91309A Unspecified open wound, unspecified foot, initial encounter: Secondary | ICD-10-CM

## 2022-11-01 DIAGNOSIS — M869 Osteomyelitis, unspecified: Secondary | ICD-10-CM | POA: Diagnosis present

## 2022-11-01 LAB — POCT GLYCOSYLATED HEMOGLOBIN (HGB A1C): Hemoglobin A1C: 6.5 % — AB (ref 4.0–5.6)

## 2022-11-01 LAB — GLUCOSE, CAPILLARY: Glucose-Capillary: 114 mg/dL — ABNORMAL HIGH (ref 70–99)

## 2022-11-01 MED ORDER — AMOXICILLIN-POT CLAVULANATE 875-125 MG PO TABS
1.0000 | ORAL_TABLET | Freq: Two times a day (BID) | ORAL | 0 refills | Status: AC
  Filled 2022-11-01: qty 28, 14d supply, fill #0

## 2022-11-01 MED ORDER — DOXYCYCLINE HYCLATE 50 MG PO CAPS
100.0000 mg | ORAL_CAPSULE | Freq: Two times a day (BID) | ORAL | 0 refills | Status: AC
  Filled 2022-11-01: qty 56, 14d supply, fill #0

## 2022-11-01 MED ORDER — INVOKAMET XR 150-1000 MG PO TB24
2.0000 | ORAL_TABLET | Freq: Every day | ORAL | 3 refills | Status: DC
  Filled 2022-11-01 (×2): qty 60, 30d supply, fill #0
  Filled 2022-12-19 – 2022-12-26 (×2): qty 60, 30d supply, fill #1
  Filled 2022-12-27: qty 60, 30d supply, fill #0

## 2022-11-01 NOTE — Assessment & Plan Note (Addendum)
Assessment: Carl Conley presents with multiple open foot wounds (see media tab).He states they have been present since July when he was out walking barefoot. 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 Patient is currently uninsured and notes he would have a difficult time affording the payments for lab work or MRI/xray. Thankfully his glucose levels have remained well controlled, he is not having consistent systemic symptoms.  Will plan to treat conservatively for now and have patient follow up in two weeks. He was given strict return precautions to call the clinic sooner if he notices worsening erythema, pain, or develops consistent systemic symptoms (mentions feeling febrile 1x). Hopeful he will have some significant improvement with PO abx but if not, he will need hospital admission for surgical intervention.   Of note, blood pressures have been low during prior visits do not believe he is septic.   Plan: - augmentin 875 BID and doxycyline 100 mg BID for 14 days - return to clinic in 2 weeks for reassessment - given orange card paperwork

## 2022-11-01 NOTE — Assessment & Plan Note (Signed)
Assessment: Presents for follow up for his type 2 diabetes. He has been adherent to his invokamet without symptoms or side effects. He does have multiple open foot wounds (see other problem). A1c today of 6.5% from 6.6.%.   Plan: - continue invokamet XR [phone] two tabs daily - follow up in two weeks for foot exam

## 2022-11-01 NOTE — Patient Instructions (Signed)
Thank you, Fransisco Beau for allowing [place] to provide your care today. Today we discussed .  Diabetes Please continue to take your invokamet and work to have a healthy diet  Foot wounds Please take the antibiotics prescribed and keep your feet clean, dry, and make sure to wear shoes. Continue to check your feet daily for signs of worsening infection; increasing drainage, redness, pain, change in color, or swelling. I would like to see you in two weeks to make sure they are improving.   IF your wounds begin worsening or you start having symptoms of fevers, chills, nausea, increasing redness up your leg, worsening pain, please call the clinic to be seen sooner or present to the emergency department. Please keep the wounds bandaged and clean areas with soapy water.   I have ordered the following labs for you:   Lab Orders         Glucose, capillary         POC Hbg A1C       Referrals ordered today:   Referral Orders  No referral(s) requested today     I have ordered the following medication/changed the following medications:   Stop the following medications: Medications Discontinued During This Encounter  Medication Reason   oxyCODONE-acetaminophen (PERCOCET) 5-325 MG tablet    Canagliflozin-metFORMIN HCl ER (INVOKAMET XR) [phone] MG TB24 Reorder     Start the following medications: Meds ordered this encounter  Medications   Canagliflozin-metFORMIN HCl ER (INVOKAMET XR) [phone] MG TB24    Sig: Take 2 tablets by mouth daily.    Dispense:  90 tablet    Refill:  3    [facility]   doxycycline (VIBRAMYCIN) 50 MG capsule    Sig: Take 2 capsules (100 mg total) by mouth 2 (two) times daily for 14 days.    Dispense:  56 capsule    Refill:  0    [facility] program   amoxicillin-clavulanate (AUGMENTIN) 875-125 MG tablet    Sig: Take 1 tablet by mouth 2 (two) times daily for 14 days.    Dispense:  28 tablet    Refill:  0    [facility] program     Follow up:  2 weeks     Should you  have any questions or concerns please call the internal medicine clinic at [phone].    Thalia Bloodgood, D.O. [facility]

## 2022-11-01 NOTE — Assessment & Plan Note (Signed)
>>  ASSESSMENT AND PLAN FOR MULTIPLE OPEN WOUNDS OF FOOT WRITTEN ON 11/01/2022  4:58 PM BY Belva Agee, MD  Assessment: Carl Conley presents with multiple open foot wounds (see media tab).He states they have been present since July when he was out walking barefoot. 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 Patient is currently uninsured and notes he would have a difficult time affording the payments for lab work or MRI/xray. Thankfully his glucose levels have remained well controlled, he is not having consistent systemic symptoms.  Will plan to treat conservatively for now and have patient follow up in two weeks. He was given strict return precautions to call the clinic sooner if he notices worsening erythema, pain, or develops consistent systemic symptoms (mentions feeling febrile 1x). Hopeful he will have some significant improvement with PO abx but if not, he will need hospital admission for surgical intervention.   Of note, blood pressures have been low during prior visits do not believe he is septic.   Plan: - augmentin 875 BID and doxycyline 100 mg BID for 14 days - return to clinic in 2 weeks for reassessment - given orange card paperwork

## 2022-11-01 NOTE — Progress Notes (Signed)
CC: Diabetes, foot wounds  HPI:  [name] is a 37 y.o. male living with a history stated below and presents today for follow up of his type 2 diabetes and bilateral foot wounds. Please see problem based assessment and plan for additional details.  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   Depression 10/31/2016   Diabetes mellitus (HCC)    dx date 2004 at age 56   Low back pain 09/01/2016   Visual snow syndrome     Current Outpatient Medications on File Prior to Visit  Medication Sig Dispense Refill   citalopram (CELEXA) 20 MG tablet TAKE 1 TABLET BY MOUTH EVERY DAY 90 tablet 2   ketoconazole (NIZORAL) 2 % shampoo APPLY 1 APPLICATION TOPICALLY 2 (TWO) TIMES A WEEK. 120 mL 0   naproxen (NAPROSYN) 375 MG tablet Take 1 tablet (375 mg total) by mouth 2 (two) times daily. 20 tablet 0   silver sulfADIAZINE (SILVADENE) 1 % cream Apply 1 application topically daily. 50 g 0   No current facility-administered medications on file prior to visit.    Family History  Problem Relation Age of Onset   Depression Mother     Social History   Socioeconomic History   Marital status: Single    Spouse name: Not on file   Number of children: 0   Years of education: BA   Highest education level: Bachelor's degree (e.g., BA, AB, BS)  Occupational History   Occupation: N/A  Tobacco Use   Smoking status: Never   Smokeless tobacco: Never  Vaping Use   Vaping Use: Never used  Substance and Sexual Activity   Alcohol use: No   Drug use: No   Sexual activity: Not on file  Other Topics Concern   Not on file  Social History Narrative   Denies caffeine use   Social Determinants of Health   Financial Resource Strain: Not on file  Food Insecurity: Not on file  Transportation Needs: Not on file  Physical Activity: Not on file  Stress: Not on file  Social Connections: Not on file  Intimate Partner Violence: Not on file    Review of  Systems: ROS negative except for what is noted on the assessment and plan.  Vitals:   11/01/22 1601  BP: 94/64  Pulse: 89  Temp: 98.5 F (36.9 C)  TempSrc: Oral  SpO2: 100%  Weight: 225 lb 4.8 oz (102.2 kg)  Height: 5\' 11"  (1.803 m)    Physical Exam: Constitutional: no acute distress HENT: normocephalic atraumatic, mucous membranes moist Eyes: conjunctiva non-erythematous Neck: supple Pulmonary/Chest: normal work of breathing on room air MSK: normal bulk and tone. 2 cm in diameter wound to the right lateral malleolus. Ulcer of ball of left foot and ulcer of the lateral plantar aspect of left foot. Ulcer with purulent drainage on plantar aspect distal 2nd left toe. Ulcer of right second toe.  Neurological: alert & oriented x 3, decreased sensation bilateral lower extremities Skin: warm and dry. Erythema surrounding foot wounds Psych: normal mood           Assessment & Plan:   Diabetes (HCC) Assessment: Presents for follow up for his type 2 diabetes. He has been adherent to his invokamet without symptoms or side effects. He does have multiple open foot wounds (see other problem). A1c today of 6.5% from 6.6.%.   Plan: - continue invokamet XR [phone] two tabs daily - follow  up in two weeks for foot exam  Multiple open wounds of foot Assessment: Carl Conley presents with multiple open foot wounds (see media tab).He states they have been present since July when he was out walking barefoot. 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 Patient is currently uninsured and notes he would have a difficult time affording the payments for lab work or MRI/xray. Thankfully his glucose levels have remained well controlled, he is not having consistent systemic symptoms.  Will plan to treat conservatively for now and have patient follow up in two weeks. He was given strict return precautions to call the clinic sooner if he notices worsening erythema, pain, or  develops consistent systemic symptoms (mentions feeling febrile 1x). Hopeful he will have some significant improvement with PO abx but if not, he will need hospital admission for surgical intervention.   Plan: - augmentin 875 BID and doxycyline 100 mg BID for 14 days - return to clinic in 2 weeks for reassessment - given orange card paperwork  Patient seen with Dr. Dario Ave, D.O. [facility], PGY-3 Phone: [phone] Date 11/01/2022 Time 4:51 PM

## 2022-11-15 ENCOUNTER — Encounter: Payer: Self-pay | Admitting: Student

## 2022-11-15 ENCOUNTER — Ambulatory Visit (INDEPENDENT_AMBULATORY_CARE_PROVIDER_SITE_OTHER): Payer: Medicaid Other | Admitting: Student

## 2022-11-15 ENCOUNTER — Ambulatory Visit (HOSPITAL_COMMUNITY)
Admission: RE | Admit: 2022-11-15 | Discharge: 2022-11-15 | Disposition: A | Discharge status: Home / Self Care | Payer: Medicaid Other | Source: Ambulatory Visit | Attending: Internal Medicine | Admitting: Internal Medicine

## 2022-11-15 VITALS — BP 90/62 | HR 86 | Temp 98.0°F | Ht 71.0 in | Wt 220.8 lb

## 2022-11-15 DIAGNOSIS — Z79899 Other long term (current) drug therapy: Secondary | ICD-10-CM

## 2022-11-15 DIAGNOSIS — S91309A Unspecified open wound, unspecified foot, initial encounter: Secondary | ICD-10-CM | POA: Insufficient documentation

## 2022-11-15 NOTE — Patient Instructions (Signed)
Thank you, Carl Conley for allowing [place] to provide your care today. Today we discussed .  Foot wounds Please keep the area clean. Please finish your doxycyline and we will call you with antibiotic results  Diabetes Please continue your diabetic medications    I have ordered the following labs for you:  Lab Orders  No laboratory test(s) ordered today    Referrals ordered today:   Referral Orders  No referral(s) requested today     I have ordered the following medication/changed the following medications:   Stop the following medications: There are no discontinued medications.   Start the following medications: No orders of the defined types were placed in this encounter.    Follow up: We will call you with xray results and discuss antibiotics. Please call if anything comes up and please fill out the orange card  Should you have any questions or concerns please call the internal medicine clinic at [phone].    Sanjuana Letters, D.O. [place]

## 2022-11-15 NOTE — Progress Notes (Signed)
Internal Medicine Clinic Attending  Case discussed at the time of the visit.  We reviewed the resident's history and exam and pertinent patient test results.  I agree with the assessment, diagnosis, and plan of care documented in the resident's note.  

## 2022-11-16 NOTE — Assessment & Plan Note (Signed)
>>  ASSESSMENT AND PLAN FOR MULTIPLE OPEN WOUNDS OF FOOT WRITTEN ON 11/16/2022  7:36 AM BY Riesa Pope, MD  Assessment: Patient found to have multiple diabetic foot wounds (2 cm in diameter wound to the right lateral malleolus. Ulcer of ball of left foot and ulcer of the lateral plantar aspect of left foot. Ulcer with purulent drainage on plantar aspect distal 2nd left toe. Ulcer of right second toe) for the last 6 month first seen during his visit to our clinic 2 weeks ago. He has a few more pills left of the doxycycline prescribed. His wound show some improvement; less drainage and regression of his erythema. His wounds remain quite concerning and with him being uninsured will obtain xrays of both feet and right ankle. There is concern for chronic osteo and need for amputation, thankfully no systemic symptoms. Discussed the urgency of him getting his orange card paperwork filled out and also discussed medicaid expansion, uninsured status may make outpatient referrals more difficult  Plan: - follow up xrays, decision will be made based on the results - complete course of doxycycline, had 1-2 episodes of nausea or vomiting.

## 2022-11-16 NOTE — Progress Notes (Signed)
CC: Bilateral foot wounds  HPI:  Carl Conley is a 37 y.o. male living with a history stated below and presents today for follow up of his bilateral foot wounds. Please see problem based assessment and plan for additional details.  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   Depression 10/31/2016   Diabetes mellitus ([place])    dx date 2004 at age 42   Low back pain 09/01/2016   Visual snow syndrome     Current Outpatient Medications on File Prior to Visit  Medication Sig Dispense Refill   amoxicillin-clavulanate (AUGMENTIN) 875-125 MG tablet Take 1 tablet by mouth 2 (two) times daily for 14 days. 28 tablet 0   Canagliflozin-metFORMIN HCl ER (INVOKAMET XR) [phone] MG TB24 Take 2 tablets by mouth daily. 90 tablet 3   citalopram (CELEXA) 20 MG tablet TAKE 1 TABLET BY MOUTH EVERY DAY 90 tablet 2   doxycycline (VIBRAMYCIN) 50 MG capsule Take 2 capsules (100 mg total) by mouth 2 (two) times daily for 14 days. 56 capsule 0   ketoconazole (NIZORAL) 2 % shampoo APPLY 1 APPLICATION TOPICALLY 2 (TWO) TIMES A WEEK. 120 mL 0   naproxen (NAPROSYN) 375 MG tablet Take 1 tablet (375 mg total) by mouth 2 (two) times daily. 20 tablet 0   silver sulfADIAZINE (SILVADENE) 1 % cream Apply 1 application topically daily. 50 g 0   No current facility-administered medications on file prior to visit.    Family History  Problem Relation Age of Onset   Depression Mother     Social History   Socioeconomic History   Marital status: Single    Spouse name: Not on file   Number of children: 0   Years of education: BA   Highest education level: Bachelor's degree (e.g., BA, AB, BS)  Occupational History   Occupation: N/A  Tobacco Use   Smoking status: Never   Smokeless tobacco: Never  Vaping Use   Vaping Use: Never used  Substance and Sexual Activity   Alcohol use: No   Drug use: No   Sexual activity: Not on file  Other Topics Concern   Not on  file  Social History Narrative   Denies caffeine use   Social Determinants of Health   Financial Resource Strain: Not on file  Food Insecurity: Not on file  Transportation Needs: Not on file  Physical Activity: Not on file  Stress: Not on file  Social Connections: Not on file  Intimate Partner Violence: Not on file    Review of Systems: ROS negative except for what is noted on the assessment and plan.  Vitals:   11/15/22 1013  BP: 90/62  Pulse: 86  Temp: 98 F (36.7 C)  TempSrc: Oral  SpO2: 98%  Weight: 220 lb 12.8 oz (100.2 kg)  Height: 5\' 11"  (1.803 m)    Physical Exam: Constitutional: in no acute distress HENT: normocephalic atraumatic, mucous membranes moist Eyes: conjunctiva non-erythematous Neck: supple MSK: normal bulk and tone Neurological: alert & oriented x 3 Skin: warm and dry. 2 cm in diameter wound to the right lateral malleolus. Ulcer of ball of left foot and ulcer of the lateral plantar aspect of left foot. Ulcer without purulent drainage on plantar aspect distal 2nd left toe. Ulcer of right second toe.  Psych: normal mood  Assessment & Plan:   Multiple open wounds of foot Assessment: Patient found to have multiple diabetic foot wounds (2 cm  in diameter wound to the right lateral malleolus. Ulcer of ball of left foot and ulcer of the lateral plantar aspect of left foot. Ulcer with purulent drainage on plantar aspect distal 2nd left toe. Ulcer of right second toe) for the last 6 month first seen during his visit to our clinic 2 weeks ago. He has a few more pills left of the doxycycline prescribed. His wound show some improvement; less drainage and regression of his erythema. His wounds remain quite concerning and with him being uninsured will obtain xrays of both feet and right ankle. There is concern for chronic osteo and need for amputation, thankfully no systemic symptoms. Discussed the urgency of him getting his orange card paperwork filled out and also  discussed medicaid expansion, uninsured status may make outpatient referrals more difficult  Plan: - follow up xrays, decision will be made based on the results - complete course of doxycycline, had 1-2 episodes of nausea or vomiting.  Patient discussed with Dr. Caffie Damme, D.O. [place], PGY-3 Phone: [phone] Date 11/16/2022 Time 7:37 AM

## 2022-11-16 NOTE — Progress Notes (Signed)
Internal Medicine Clinic Attending  I saw and evaluated the patient.  I personally confirmed the key portions of the history and exam documented by Dr. Johnney Ou and I reviewed pertinent patient test results.  The assessment, diagnosis, and plan were formulated together and I agree with the documentation in the resident's note.

## 2022-11-16 NOTE — Assessment & Plan Note (Addendum)
Assessment: Patient found to have multiple diabetic foot wounds (2 cm in diameter wound to the right lateral malleolus. Ulcer of ball of left foot and ulcer of the lateral plantar aspect of left foot. Ulcer with purulent drainage on plantar aspect distal 2nd left toe. Ulcer of right second toe) for the last 6 month first seen during his visit to our clinic 2 weeks ago. He has a few more pills left of the doxycycline prescribed. His wound show some improvement; less drainage and regression of his erythema. His wounds remain quite concerning and with him being uninsured will obtain xrays of both feet and right ankle. There is concern for chronic osteo and need for amputation, thankfully no systemic symptoms. Discussed the urgency of him getting his orange card paperwork filled out and also discussed medicaid expansion, uninsured status may make outpatient referrals more difficult  Plan: - follow up xrays, decision will be made based on the results - complete course of doxycycline, had 1-2 episodes of nausea or vomiting.

## 2022-11-17 ENCOUNTER — Encounter (HOSPITAL_COMMUNITY): Payer: Self-pay

## 2022-11-17 ENCOUNTER — Encounter (HOSPITAL_COMMUNITY): Payer: Self-pay | Admitting: Internal Medicine

## 2022-11-17 ENCOUNTER — Inpatient Hospital Stay (HOSPITAL_COMMUNITY)
Admit: 2022-11-17 | Discharge: 2022-12-03 | DRG: 617 | Disposition: A | Discharge status: Home Health | Payer: Medicaid Other | Source: Ambulatory Visit | Attending: Student in an Organized Health Care Education/Training Program | Admitting: Student in an Organized Health Care Education/Training Program

## 2022-11-17 ENCOUNTER — Telehealth: Payer: Self-pay | Admitting: Student

## 2022-11-17 ENCOUNTER — Other Ambulatory Visit: Payer: Self-pay

## 2022-11-17 ENCOUNTER — Other Ambulatory Visit: Payer: Self-pay | Admitting: Internal Medicine

## 2022-11-17 DIAGNOSIS — B965 Pseudomonas (aeruginosa) (mallei) (pseudomallei) as the cause of diseases classified elsewhere: Secondary | ICD-10-CM | POA: Diagnosis present

## 2022-11-17 DIAGNOSIS — L97519 Non-pressure chronic ulcer of other part of right foot with unspecified severity: Secondary | ICD-10-CM | POA: Diagnosis present

## 2022-11-17 DIAGNOSIS — E871 Hypo-osmolality and hyponatremia: Secondary | ICD-10-CM | POA: Diagnosis present

## 2022-11-17 DIAGNOSIS — Z7984 Long term (current) use of oral hypoglycemic drugs: Secondary | ICD-10-CM | POA: Diagnosis not present

## 2022-11-17 DIAGNOSIS — E11628 Type 2 diabetes mellitus with other skin complications: Secondary | ICD-10-CM | POA: Diagnosis present

## 2022-11-17 DIAGNOSIS — B0052 Herpesviral keratitis: Secondary | ICD-10-CM | POA: Diagnosis present

## 2022-11-17 DIAGNOSIS — F909 Attention-deficit hyperactivity disorder, unspecified type: Secondary | ICD-10-CM | POA: Diagnosis present

## 2022-11-17 DIAGNOSIS — Z818 Family history of other mental and behavioral disorders: Secondary | ICD-10-CM | POA: Diagnosis not present

## 2022-11-17 DIAGNOSIS — M8618 Other acute osteomyelitis, other site: Secondary | ICD-10-CM | POA: Diagnosis not present

## 2022-11-17 DIAGNOSIS — M86172 Other acute osteomyelitis, left ankle and foot: Secondary | ICD-10-CM | POA: Diagnosis present

## 2022-11-17 DIAGNOSIS — E08621 Diabetes mellitus due to underlying condition with foot ulcer: Secondary | ICD-10-CM | POA: Diagnosis not present

## 2022-11-17 DIAGNOSIS — F32A Depression, unspecified: Secondary | ICD-10-CM | POA: Diagnosis present

## 2022-11-17 DIAGNOSIS — E1152 Type 2 diabetes mellitus with diabetic peripheral angiopathy with gangrene: Secondary | ICD-10-CM | POA: Diagnosis present

## 2022-11-17 DIAGNOSIS — G8929 Other chronic pain: Secondary | ICD-10-CM | POA: Diagnosis present

## 2022-11-17 DIAGNOSIS — L97529 Non-pressure chronic ulcer of other part of left foot with unspecified severity: Secondary | ICD-10-CM | POA: Diagnosis present

## 2022-11-17 DIAGNOSIS — L97319 Non-pressure chronic ulcer of right ankle with unspecified severity: Secondary | ICD-10-CM | POA: Diagnosis present

## 2022-11-17 DIAGNOSIS — E785 Hyperlipidemia, unspecified: Secondary | ICD-10-CM | POA: Diagnosis present

## 2022-11-17 DIAGNOSIS — E119 Type 2 diabetes mellitus without complications: Secondary | ICD-10-CM

## 2022-11-17 DIAGNOSIS — S91309A Unspecified open wound, unspecified foot, initial encounter: Secondary | ICD-10-CM

## 2022-11-17 DIAGNOSIS — L03032 Cellulitis of left toe: Secondary | ICD-10-CM | POA: Diagnosis present

## 2022-11-17 DIAGNOSIS — L02612 Cutaneous abscess of left foot: Secondary | ICD-10-CM | POA: Diagnosis present

## 2022-11-17 DIAGNOSIS — D649 Anemia, unspecified: Secondary | ICD-10-CM | POA: Diagnosis not present

## 2022-11-17 DIAGNOSIS — L8990 Pressure ulcer of unspecified site, unspecified stage: Secondary | ICD-10-CM | POA: Insufficient documentation

## 2022-11-17 DIAGNOSIS — B9561 Methicillin susceptible Staphylococcus aureus infection as the cause of diseases classified elsewhere: Secondary | ICD-10-CM | POA: Diagnosis present

## 2022-11-17 DIAGNOSIS — E875 Hyperkalemia: Secondary | ICD-10-CM | POA: Diagnosis present

## 2022-11-17 DIAGNOSIS — E1169 Type 2 diabetes mellitus with other specified complication: Principal | ICD-10-CM | POA: Diagnosis present

## 2022-11-17 DIAGNOSIS — Z89422 Acquired absence of other left toe(s): Secondary | ICD-10-CM | POA: Diagnosis not present

## 2022-11-17 DIAGNOSIS — M869 Osteomyelitis, unspecified: Secondary | ICD-10-CM | POA: Diagnosis not present

## 2022-11-17 DIAGNOSIS — K760 Fatty (change of) liver, not elsewhere classified: Secondary | ICD-10-CM | POA: Diagnosis present

## 2022-11-17 DIAGNOSIS — I70222 Atherosclerosis of native arteries of extremities with rest pain, left leg: Secondary | ICD-10-CM | POA: Diagnosis not present

## 2022-11-17 DIAGNOSIS — E11621 Type 2 diabetes mellitus with foot ulcer: Secondary | ICD-10-CM | POA: Diagnosis present

## 2022-11-17 DIAGNOSIS — M86171 Other acute osteomyelitis, right ankle and foot: Secondary | ICD-10-CM | POA: Diagnosis not present

## 2022-11-17 DIAGNOSIS — E1159 Type 2 diabetes mellitus with other circulatory complications: Principal | ICD-10-CM

## 2022-11-17 DIAGNOSIS — Z79899 Other long term (current) drug therapy: Secondary | ICD-10-CM

## 2022-11-17 DIAGNOSIS — E1165 Type 2 diabetes mellitus with hyperglycemia: Secondary | ICD-10-CM | POA: Diagnosis present

## 2022-11-17 DIAGNOSIS — S91302D Unspecified open wound, left foot, subsequent encounter: Secondary | ICD-10-CM | POA: Diagnosis not present

## 2022-11-17 DIAGNOSIS — E1140 Type 2 diabetes mellitus with diabetic neuropathy, unspecified: Secondary | ICD-10-CM | POA: Diagnosis present

## 2022-11-17 DIAGNOSIS — Z89421 Acquired absence of other right toe(s): Secondary | ICD-10-CM | POA: Diagnosis not present

## 2022-11-17 DIAGNOSIS — Z794 Long term (current) use of insulin: Secondary | ICD-10-CM

## 2022-11-17 DIAGNOSIS — S91301D Unspecified open wound, right foot, subsequent encounter: Secondary | ICD-10-CM | POA: Diagnosis not present

## 2022-11-17 LAB — COMPREHENSIVE METABOLIC PANEL
ALT: 25 U/L (ref 0–44)
AST: 32 U/L (ref 15–41)
Albumin: 3 g/dL — ABNORMAL LOW (ref 3.5–5.0)
Alkaline Phosphatase: 71 U/L (ref 38–126)
Anion gap: 10 (ref 5–15)
BUN: 10 mg/dL (ref 6–20)
CO2: 21 mmol/L — ABNORMAL LOW (ref 22–32)
Calcium: 9 mg/dL (ref 8.9–10.3)
Chloride: 104 mmol/L (ref 98–111)
Creatinine, Ser: 1.11 mg/dL (ref 0.61–1.24)
GFR, Estimated: >60 mL/min (ref >60)
Glucose, Bld: 149 mg/dL — ABNORMAL HIGH (ref 70–99)
Potassium: 3.9 mmol/L (ref 3.5–5.1)
Sodium: 135 mmol/L (ref 135–145)
Total Bilirubin: 0.4 mg/dL (ref 0.3–1.2)
Total Protein: 6.9 g/dL (ref 6.5–8.1)

## 2022-11-17 LAB — GLUCOSE, CAPILLARY
Glucose-Capillary: 96 mg/dL (ref 70–99)
Glucose-Capillary: 135 mg/dL — ABNORMAL HIGH (ref 70–99)

## 2022-11-17 LAB — SEDIMENTATION RATE: Sed Rate: 49 mm/hr — ABNORMAL HIGH (ref 0–16)

## 2022-11-17 LAB — CBC
HCT: 33.5 % — ABNORMAL LOW (ref 39.0–52.0)
Hemoglobin: 10.7 g/dL — ABNORMAL LOW (ref 13.0–17.0)
MCH: 25.8 pg — ABNORMAL LOW (ref 26.0–34.0)
MCHC: 31.9 g/dL (ref 30.0–36.0)
MCV: 80.9 fL (ref 80.0–100.0)
Platelets: 226 10*3/uL (ref 150–400)
RBC: 4.14 MIL/uL — ABNORMAL LOW (ref 4.22–5.81)
RDW: 15.4 % (ref 11.5–15.5)
WBC: 10.4 10*3/uL (ref 4.0–10.5)
nRBC: 0 % (ref 0.0–0.2)

## 2022-11-17 LAB — C-REACTIVE PROTEIN: CRP: 1.1 mg/dL — ABNORMAL HIGH (ref <1.0)

## 2022-11-17 MED ORDER — INSULIN ASPART 100 UNIT/ML IJ SOLN
0.0000 [IU] | Freq: Every day | INTRAMUSCULAR | Status: DC
  Administered 2022-11-21 – 2022-11-27 (×2): 2 [IU] via SUBCUTANEOUS

## 2022-11-17 MED ORDER — VANCOMYCIN HCL 2000 MG/400ML IV SOLN
2000.0000 mg | Freq: Once | INTRAVENOUS | Status: AC
  Administered 2022-11-17: 2000 mg via INTRAVENOUS
  Filled 2022-11-17: qty 400

## 2022-11-17 MED ORDER — SODIUM CHLORIDE 0.9 % IV SOLN
2.0000 g | Freq: Once | INTRAVENOUS | Status: AC
  Administered 2022-11-17: 2 g via INTRAVENOUS
  Filled 2022-11-17: qty 12.5

## 2022-11-17 MED ORDER — CITALOPRAM HYDROBROMIDE 20 MG PO TABS
20.0000 mg | ORAL_TABLET | Freq: Every day | ORAL | Status: DC
  Administered 2022-11-18 – 2022-12-02 (×14): 20 mg via ORAL
  Filled 2022-11-17 (×15): qty 1

## 2022-11-17 MED ORDER — ACETAMINOPHEN 650 MG RE SUPP: 650.0000 mg | Freq: Four times a day (QID) | RECTAL | Status: DC | PRN

## 2022-11-17 MED ORDER — ACETAMINOPHEN 325 MG PO TABS: 650.0000 mg | ORAL_TABLET | Freq: Four times a day (QID) | ORAL | Status: DC | PRN

## 2022-11-17 MED ORDER — SODIUM CHLORIDE 0.9 % IV SOLN
2.0000 g | Freq: Three times a day (TID) | INTRAVENOUS | Status: DC
  Administered 2022-11-18 – 2022-12-03 (×46): 2 g via INTRAVENOUS
  Filled 2022-11-17 (×48): qty 12.5

## 2022-11-17 MED ORDER — INSULIN ASPART 100 UNIT/ML IJ SOLN
0.0000 [IU] | Freq: Three times a day (TID) | INTRAMUSCULAR | Status: DC
  Administered 2022-11-18 (×2): 2 [IU] via SUBCUTANEOUS
  Administered 2022-11-19 (×2): 3 [IU] via SUBCUTANEOUS
  Administered 2022-11-20 (×2): 2 [IU] via SUBCUTANEOUS
  Administered 2022-11-20: 3 [IU] via SUBCUTANEOUS
  Administered 2022-11-21 – 2022-11-23 (×6): 2 [IU] via SUBCUTANEOUS
  Administered 2022-11-24 – 2022-11-26 (×6): 3 [IU] via SUBCUTANEOUS
  Administered 2022-11-27: 2 [IU] via SUBCUTANEOUS
  Administered 2022-11-27 – 2022-12-01 (×9): 3 [IU] via SUBCUTANEOUS
  Administered 2022-12-01: 2 [IU] via SUBCUTANEOUS
  Administered 2022-12-01 – 2022-12-03 (×4): 3 [IU] via SUBCUTANEOUS

## 2022-11-17 MED ORDER — ENOXAPARIN SODIUM 40 MG/0.4ML IJ SOSY
40.0000 mg | PREFILLED_SYRINGE | INTRAMUSCULAR | Status: DC
  Administered 2022-11-17: 40 mg via SUBCUTANEOUS
  Filled 2022-11-17: qty 0.4

## 2022-11-17 MED ORDER — VANCOMYCIN HCL 1250 MG/250ML IV SOLN
1250.0000 mg | Freq: Two times a day (BID) | INTRAVENOUS | Status: DC
  Administered 2022-11-18 – 2022-11-20 (×6): 1250 mg via INTRAVENOUS
  Filled 2022-11-17 (×7): qty 250

## 2022-11-17 NOTE — Telephone Encounter (Signed)
6 month history of bilateral foot wounds that patient presented to our outpatient clinic for last week. Xray imaging of right foot with bony destruction of middle phalanx of second toe and irregularity of distal phalanx concerning for osteomyelitis. Left foot xray with extensive bony destruction of left second toe through entire phalanx and head of second metatarsal suspicious for osteomyelitis. Also has bony changes to metatarsal shaft.   Thankfully he has not developed systemic symptoms over the last 6 months but will need hospitalization for IV antibiotics and likely amputation. Patient called for direct admission and bed placement notified.

## 2022-11-17 NOTE — Hospital Course (Addendum)
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 Previously seen in our clinic on 12/28, started on augmentin/doxycycline for a two week course w/o resolution. Feet XRAY from 1/11 demonstrate bony destruction of the second toes bilaterally, and bony irregularity and periosteal reaction of the left metatarsal shaft consistent with osteomyelitis. CRP elevated at 1.1. ESR elevated at 49. MRI of L/R foot on admission with osteo.  The patient is now s\p amputation of R 2nd toe at MTP joint, R distal fibula biopsy, debridement of R ankle, and L TMA. Cultures grew MSSA and pseudomonas. R distal fibula bx also showing osteo, discussed with vascular and podiatry and they recommended attempting conservative management with abx and followup surgery only if necessary.  ID recommending outpatient IV cefepime, PICC line was placed and OPAT orders placed- will receive cefepime through 2/29 and has follow up with infectious disease scheduled.  Angiogram during hospitalization showed normal arteries in bilatearl lower extremities to the level of the feet but significant small vessel disease without intact pedal arches. Per vascular surgery, there are no revascularization options at this time.  Patient will discharge home with home health and will follow up in the [facility]. He is able to bear weight as tolerated per podiatry. Additionally, he is recommended to get a follow up ABI in 1 year.   T2DM A1c 6.5% from 10/2022.  On Invokamet 150-1000mg  BID at home. CBGs largely well managed postop.   HLD LDL 141 with peripheral small vessel disease. Started on crestor 20.  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 Takes citalopram at home. Endorsed very infrequent sharp pain in L foot possibly with "phantom symptoms".  Will hold off on changing regimen at this point as he  declined- had suggested switching to duloxetine during admission.  Hx of HSV keratitis Diagnosed in 2017. Seen by Dr. Katy Fitch, ophthalmology. Takes acyclovir at home  Discussed mobility and assistive devices. Discussed importance of antibiotics.

## 2022-11-17 NOTE — Plan of Care (Signed)
  Problem: Education: ?Goal: Knowledge of General Education information will improve ?Description: Including pain rating scale, medication(s)/side effects and non-pharmacologic comfort measures ?Outcome: Progressing ?  ?Problem: Health Behavior/Discharge Planning: ?Goal: Ability to manage health-related needs will improve ?Outcome: Progressing ?  ?Problem: Clinical Measurements: ?Goal: Ability to maintain clinical measurements within normal limits will improve ?Outcome: Progressing ?Goal: Will remain free from infection ?Outcome: Progressing ?Goal: Diagnostic test results will improve ?Outcome: Progressing ?Goal: Respiratory complications will improve ?Outcome: Progressing ?Goal: Cardiovascular complication will be avoided ?Outcome: Progressing ?  ?Problem: Activity: ?Goal: Risk for activity intolerance will decrease ?Outcome: Progressing ?  ?Problem: Nutrition: ?Goal: Adequate nutrition will be maintained ?Outcome: Progressing ?  ?Problem: Coping: ?Goal: Level of anxiety will decrease ?Outcome: Progressing ?  ?Problem: Elimination: ?Goal: Will not experience complications related to bowel motility ?Outcome: Progressing ?Goal: Will not experience complications related to urinary retention ?Outcome: Progressing ?  ?Problem: Pain Managment: ?Goal: General experience of comfort will improve ?Outcome: Progressing ?  ?Problem: Safety: ?Goal: Ability to remain free from injury will improve ?Outcome: Progressing ?  ?Problem: Skin Integrity: ?Goal: Risk for impaired skin integrity will decrease ?Outcome: Progressing ?  ?Problem: Education: ?Goal: Knowledge of General Education information will improve ?Description: Including pain rating scale, medication(s)/side effects and non-pharmacologic comfort measures ?Outcome: Progressing ?  ?Problem: Health Behavior/Discharge Planning: ?Goal: Ability to manage health-related needs will improve ?Outcome: Progressing ?  ?Problem: Clinical Measurements: ?Goal: Ability to maintain  clinical measurements within normal limits will improve ?Outcome: Progressing ?Goal: Will remain free from infection ?Outcome: Progressing ?Goal: Diagnostic test results will improve ?Outcome: Progressing ?Goal: Respiratory complications will improve ?Outcome: Progressing ?Goal: Cardiovascular complication will be avoided ?Outcome: Progressing ?  ?Problem: Activity: ?Goal: Risk for activity intolerance will decrease ?Outcome: Progressing ?  ?Problem: Nutrition: ?Goal: Adequate nutrition will be maintained ?Outcome: Progressing ?  ?Problem: Coping: ?Goal: Level of anxiety will decrease ?Outcome: Progressing ?  ?Problem: Elimination: ?Goal: Will not experience complications related to bowel motility ?Outcome: Progressing ?Goal: Will not experience complications related to urinary retention ?Outcome: Progressing ?  ?Problem: Pain Managment: ?Goal: General experience of comfort will improve ?Outcome: Progressing ?  ?Problem: Safety: ?Goal: Ability to remain free from injury will improve ?Outcome: Progressing ?  ?Problem: Skin Integrity: ?Goal: Risk for impaired skin integrity will decrease ?Outcome: Progressing ?  ?

## 2022-11-17 NOTE — Addendum Note (Signed)
Addended by: Riesa Pope on: 11/17/2022 12:47 PM   Modules accepted: Orders

## 2022-11-17 NOTE — H&P (Cosign Needed Addendum)
Date: 11/17/2022               Patient Name:  Carl Conley MRN: [id number]  DOB: Dec 23, 1985 Age / Sex: 37 y.o., male   PCP: Riesa Pope, MD              Medical Service: Internal Medicine Teaching Service              Attending Physician: Dr. Sid Falcon, MD    First Contact: Spero Curb, MS 3 Pager: [phone]  Second Contact: Dr. Claudia Desanctis Yamna Mackel Pager: [phone]  Third Contact Dr. Delene Ruffini Pager: [phone]        After Hours (After 5p/  First Contact Pager: [phone]  weekends / holidays): Second Contact Pager: [phone]   Chief Complaint: Bilateral foot wound  History of Present Illness:   Carl Conley is a 37 y/o male with past medical history of T2DM, NAFLD, ADHD, and Depression who presents for bilateral foot wounds.  Patient reports developing ulcers of both feet following an injury in July 2023. He reports that his skin ripped on both feet when he was running outside barefoot while chasing his dog. Patient reports that his wounds have progressed since then. He states he has been caring for the wounds at home with neosporin and bandages without relief. He reports worsening swelling of his left foot with increased drainage from his left second toe. He was seen at [facility] on 11/01/22 when the foot wounds did not improve and was started on augmentin 875 BID and doxycyline 100 mg BID for two weeks. He states the doxycycline caused nausea/vomiting. Patient says his wounds are not painful and that he is able to ambulate without assistance. Patient endorses chronic numbness and decreased sensation in his feet bilaterally without pain. Patient says he has otherwise felt well with no fever or chills, other than an isolated episode in early December and 1x episode of non-bloody diarrhea yesterday.  Denies nausea, vomiting, abdominal pain, hematochezia, urinary frequency, dysuria, lightheadedness, or dizziness.  Reports compliance with all of his medications without missing  doses. States that he used to have poor control of his diabetes which has now improved.    Meds:  -Invokamet XR 150-1000mg , two tabs daily -Citalopram 20mg  -Acyclovir 400mg  BID  Current Meds  Medication Sig   Canagliflozin-metFORMIN HCl ER (INVOKAMET XR) [phone] MG TB24 Take 2 tablets by mouth daily.   citalopram (CELEXA) 20 MG tablet TAKE 1 TABLET BY MOUTH EVERY DAY    Allergies: Allergies as of 11/17/2022   (No Known Allergies)   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   Depression 10/31/2016   Diabetes mellitus ([place])    dx date 2004 at age 39   Low back pain 09/01/2016   Visual snow syndrome     Family History: Denies family history of seizures, stroke, heart disease, or cancer.  Social History: Patient says he is currently not working and he cares for his elderly mother with dementia at home. He denies any tobacco use, vaping, or alcohol use. Denies any recreational substances. Independent of his ADLs/iADLs.   Review of Systems: A complete ROS was negative except as per HPI.   Physical Exam: Blood pressure 113/64, pulse 91, temperature 99.2 F (37.3 C), temperature source Oral, resp. rate 14, height 5\' 11"  (1.803 m), SpO2 100 %. General: Alert, well-appearing, in no acute distress HEENT: Normocephalic, atraumatic CV: RRR, no murmurs, rubs, or gallops Pulm:  Normal respiratory effort, lungs CTA bilaterally Abd: Soft, non-tender, non-distended.  Skin: See media tab for photos- LLE: Second toe is erythematous, swollen, and draining bloody fluid, ulcer on distal plantar aspect without bleeding or drainage.  RLE: Circular ulcer over lateral malleolus, ulcer of distal second toe with necrotic tissue.  Extremities: no LE edema.  Neuro: Alert and oriented. Diminished sensation over feet bilaterally up to the malleolus.  Psych: Normal mood and affect.   Assessment & Plan by Problem: Principal Problem:   Osteomyelitis of  left foot (HCC)  Carl Conley is a 37 year old man with a PMHx significant for T2DM, NAFLD, ADHD, and Depression who presents for bilateral foot wounds and admitted for management of bilateral osteomyelitis.   Bilateral Feet Osteomyelitis 2/2 to diabetic neuropathy Patient presents for a history of bilateral foot wounds of 6 months duration. Per chart review, patient's wounds were evaluated at [facility] on 12/28 and patient was started on augmentin 875 BID and doxycyline 100 mg BID x2 weeks. There was mild improvement noted on 1/11 with less drainage and erythema. XRAY foot at that time was concerning for bony destruction of the second toes bilaterally, and bony irregularity and periosteal reaction of the left metatarsal shaft consistent with osteomyelitis. Patient was admitted directly to the floor for management of osteomyelitis. Patient denies any symptoms of systemic infection and is able to ambulate without pain, although imaging shows extensive involvement of the bone likely needing surgical intervention. Plan to start empiric antibiotics today while awaiting MRI. Will consult podiatry in the AM. -F/u Podiatry recommendations -F/u MRI Left and Right Foot -IV Vancomycin per pharmacy -IV Cefepime per pharmacy -F/u ESR, CRP -CBC  T2DM Last A1c 6.5% from 11/01/2022. CBGs WNL on admission. Patient taking Invokamet 150-1000mg  two tabs daily at home.  -Hold home Invokamet -SSI  -Trend CBGs  Hx of depression Chronic condition. On citalopram at home.  -Continue home citalopram 20mg   Hx of HSV keratitis Diagnosed in 2017. Was seen by Dr. Katy Fitch, ophthalmology. On acyclovir at home.  -Continue home acyclovir 400mg  BID  DVT Prophylaxis: none Diet: Regular IVF: none Code: Full   Dispo: Admit patient to Inpatient with expected length of stay greater than 2 midnights.  Signed: Spero Curb, Medical Student 11/17/2022, 5:13 PM  Pager: [phone]   Attestation for Student Documentation:  I  personally was present and performed or re-performed the history, physical exam and medical decision-making activities of this service and have verified that the service and findings are accurately documented in the student's note.  Starlyn Skeans, MD 11/17/2022, 7:43 PM

## 2022-11-17 NOTE — Progress Notes (Signed)
Pharmacy Antibiotic Note  Carl Conley is a 37 y.o. male admitted on 11/17/2022 with  bilateral foot wounds since July 2023, possible osteomyelitis .  Pharmacy has been consulted for Vancomycin and Cefepime dosing.  Baseline labs pending.  Plan: Vancomycin 2gm IV x 1 dose Cefepime 2gm IV x 1 dose Further doses to be placed pending renal function.  Height: 5\' 11"  (180.3 cm) IBW/kg (Calculated) : 75.3  Temp (24hrs), Avg:99.2 F (37.3 C), Min:99.2 F (37.3 C), Max:99.2 F (37.3 C)  No results for input(s): "WBC", "CREATININE", "LATICACIDVEN", "VANCOTROUGH", "VANCOPEAK", "VANCORANDOM", "GENTTROUGH", "GENTPEAK", "GENTRANDOM", "TOBRATROUGH", "TOBRAPEAK", "TOBRARND", "AMIKACINPEAK", "AMIKACINTROU", "AMIKACIN" in the last 168 hours.  CrCl cannot be calculated (Patient's most recent lab result is older than the maximum 21 days allowed.).    No Known Allergies  Antimicrobials this admission: Vanc 1/13 >>  Cefepime  1/13 >>   Dose adjustments this admission:   Microbiology results:   Thank you for allowing pharmacy to be a part of this patient's care.   [employer], Pharm.D., BCPS Clinical Pharmacist  **Pharmacist phone directory can be found on [email or web address] listed under [place].  11/17/2022 7:53 PM

## 2022-11-17 NOTE — Progress Notes (Signed)
Pharmacy Antibiotic Note  Carl Conley is a 37 y.o. male admitted on 11/17/2022 with  bilateral foot wounds since July 2023, possible osteomyelitis .  Pharmacy has been consulted for Vancomycin and Cefepime dosing.  Baseline labs pending.  Plan: Vancomycin 2gm IV x 1 dose Cefepime 2gm IV x 1 dose Further doses to be placed pending renal function.  Height: 5\' 11"  (180.3 cm) IBW/kg (Calculated) : 75.3  Temp (24hrs), Avg:99.6 F (37.6 C), Min:99.2 F (37.3 C), Max:100 F (37.8 C)  Recent Labs  Lab 11/17/22 2048  WBC 10.4  CREATININE 1.11    Estimated Creatinine Clearance: 111 mL/min (by C-G formula based on SCr of 1.11 mg/dL).    No Known Allergies  Antimicrobials this admission: Vanc 1/13 >>  Cefepime  1/13 >>   Dose adjustments this admission:   Microbiology results:   Thank you for allowing pharmacy to be a part of this patient's care.   [employer], Pharm.D., BCPS Clinical Pharmacist  **Pharmacist phone directory can be found on [email or web address] listed under [place].  11/17/2022 10:31 PM  ---  Addendum after Scr resulted Scr 1.11 with calculated CrCl >100 mL/min   -Cefepime 2g IV q8h  -vancomycin as ordered 2g for load followed by vancomycin 1250 mg IV q12h  --vancomycin levels PRN per protocol --goal AUC 400-550; predicted AUC: 518 (using Scr 1.11; Vd 0.5 for BMI~30) -continue to follow culture data, imaging studies, renal function, LOT   Wilson Singer, PharmD Clinical Pharmacist 11/17/2022 10:35 PM

## 2022-11-18 ENCOUNTER — Inpatient Hospital Stay (HOSPITAL_COMMUNITY): Payer: Medicaid Other

## 2022-11-18 DIAGNOSIS — M86172 Other acute osteomyelitis, left ankle and foot: Secondary | ICD-10-CM

## 2022-11-18 DIAGNOSIS — M86171 Other acute osteomyelitis, right ankle and foot: Secondary | ICD-10-CM

## 2022-11-18 DIAGNOSIS — L97314 Non-pressure chronic ulcer of right ankle with necrosis of bone: Secondary | ICD-10-CM

## 2022-11-18 LAB — GLUCOSE, CAPILLARY
Glucose-Capillary: 135 mg/dL — ABNORMAL HIGH (ref 70–99)
Glucose-Capillary: 138 mg/dL — ABNORMAL HIGH (ref 70–99)
Glucose-Capillary: 101 mg/dL — ABNORMAL HIGH (ref 70–99)
Glucose-Capillary: 136 mg/dL — ABNORMAL HIGH (ref 70–99)

## 2022-11-18 MED ORDER — POLYETHYLENE GLYCOL 3350 17 G PO PACK: 17.0000 g | PACK | Freq: Every day | ORAL | Status: DC | PRN

## 2022-11-18 MED ORDER — GADOBUTROL 1 MMOL/ML IV SOLN
10.0000 mL | Freq: Once | INTRAVENOUS | Status: AC | PRN
  Administered 2022-11-18: 10 mL via INTRAVENOUS

## 2022-11-18 MED ORDER — ACYCLOVIR 200 MG PO CAPS
400.0000 mg | ORAL_CAPSULE | Freq: Two times a day (BID) | ORAL | Status: DC
  Administered 2022-11-18 – 2022-12-03 (×31): 400 mg via ORAL
  Filled 2022-11-18 (×32): qty 2

## 2022-11-18 MED ORDER — ENOXAPARIN SODIUM 60 MG/0.6ML IJ SOSY: 50.0000 mg | PREFILLED_SYRINGE | INTRAMUSCULAR | Status: DC

## 2022-11-18 NOTE — Progress Notes (Signed)
PT Cancellation Note  Patient Details Name: Carl Conley MRN: [id number] DOB: 26-Dec-1985   Cancelled Treatment:    Reason Eval/Treat Not Completed: PT screened, no needs identified, will sign off - per OT, pt is independent with mobility. PT to sign off.   Stacie Glaze, PT DPT Acute Rehabilitation Services Pager [phone]  Office [phone]    Louis Matte 11/18/2022, 2:51 PM

## 2022-11-18 NOTE — TOC Initial Note (Signed)
Transition of Care [facility]) - Initial/Assessment Note    Patient Details  Name: Carl Conley MRN: [id number] Date of Birth: 18-Aug-1986  Transition of [facility]) CM/SW Contact:    Bartholomew Crews, RN Phone Number: [phone] 11/18/2022, 2:42 PM  Clinical Narrative:                  Spoke with patient at the bedside to discuss [facility] consult placed this AM. PTA patient lives with his elderly mother who is independent with ADLs and most Iadls. He has a car to get to the grocery store or medical appointments. PCP is [place]. [facility] most [facility], but gets his [facility] from [facility]. Patient is independent with adls and Iadls. He does not work stating he was in [place] several years ago and his back was injured. Confirmed his cell phone and home address. He stated that phone number listed for his mother is no longer active. Patient stated that his mother would likely be able to go to a neighbors home in the event of an emergency. Patient has the phone number for nonemergency police at bedside in case he feels a wellness check is needed. Discussed going to DSS to apply for Medicaid and will refer patient to financial counselor. TOC to follow for transition needs.   Expected Discharge Plan: Home/Self Care Barriers to Discharge: Continued Medical Work up   Patient Goals and CMS Choice Patient states their goals for this hospitalization and ongoing recovery are:: return home CMS Medicare.gov Compare Post Acute Care list provided to:: Patient Choice offered to / list presented to : NA      Expected Discharge Plan and Services In-house Referral: Clinical Social Work Discharge Planning Services: CM Consult Post Acute Care Choice: NA Living arrangements for the past 2 months: Single Family Home                 DME Arranged: N/A DME Agency: NA       HH Arranged: NA [place] Agency: NA        Prior Living Arrangements/Services Living arrangements for the past 2 months: Single  Family Home Lives with:: Parents Patient language and need for interpreter reviewed:: Yes        Need for Family Participation in Patient Care: No (Comment)     Criminal Activity/Legal Involvement Pertinent to Current Situation/Hospitalization: No - Comment as needed  Activities of Daily Living Home Assistive Devices/Equipment: None ADL Screening (condition at time of admission) Patient's cognitive ability adequate to safely complete daily activities?: Yes Is the patient deaf or have difficulty hearing?: No Does the patient have difficulty seeing, even when wearing glasses/contacts?: No Does the patient have difficulty concentrating, remembering, or making decisions?: No Patient able to express need for assistance with ADLs?: Yes Does the patient have difficulty dressing or bathing?: No Independently performs ADLs?: Yes (appropriate for developmental age) Does the patient have difficulty walking or climbing stairs?: No Weakness of Legs: None Weakness of Arms/Hands: None  Permission Sought/Granted                  Emotional Assessment Appearance:: Appears stated age Attitude/Demeanor/Rapport: Engaged Affect (typically observed): Accepting Orientation: : Oriented to Self, Oriented to Place, Oriented to  Time, Oriented to Situation Alcohol / Substance Use: Not Applicable Psych Involvement: No (comment)  Admission diagnosis:  Osteomyelitis of left foot [facility]) [M86.9] Patient Active Problem List   Diagnosis Date Noted   Osteomyelitis of left foot ([place]) 11/17/2022  Diabetic osteomyelitis ([place]) 11/17/2022   Multiple open wounds of foot 11/01/2022   Healthcare maintenance 07/17/2021   Seborrheic dermatitis 11/11/2020   Hypercalcemia 11/11/2020   Tension headache 09/10/2019   NAFLD (nonalcoholic fatty liver disease) 04/01/2019   ADHD    Visual disturbance 10/31/2016   Depression 10/31/2016   Diabetes ([place]) 04/13/2016   PCP:  Riesa Pope, MD Pharmacy:    [id number] [place] [place] - [address] [place] [address] Phone: [phone] Fax: [phone]  [place] [address] [place] [address] Phone: [phone] Fax: [phone]     Social Determinants of Health (SDOH) Social History: SDOH Screenings   Food Insecurity: No Food Insecurity (11/17/2022)  Housing: Low Risk  (11/17/2022)  Transportation Needs: No Transportation Needs (11/17/2022)  Utilities: Not At Risk (11/17/2022)  Depression (PHQ2-9): Low Risk  (07/17/2021)  Tobacco Use: Low Risk  (11/17/2022)   SDOH Interventions:     Readmission Risk Interventions     No data to display

## 2022-11-18 NOTE — Consult Note (Signed)
PODIATRY CONSULTATION  NAME Carl Conley MRN [id number] DOB 1985-12-12 DOA 11/17/2022   Reason for consult: bilateral foot ulcerations, OM   Attending/Consulting physician: Daryll Drown MD  History of present illness: 37 y.o. male with  T2DM, NAFLD, ADHD, and Depression with bilateral foot uclerations. Wound have been present since July 2023. He states the started when runing barefoot outside. Putting neosporin and bandages. Has not seen podiatry or wound care center though did have apt to do so. He has been taking PO abx. Reports worsening redness and swelling in both feet.   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   Depression 10/31/2016   Diabetes mellitus ([place])    dx date 2004 at age 73   Low back pain 09/01/2016   Visual snow syndrome        Latest Ref Rng & Units 11/17/2022    8:48 PM 02/04/2020    2:06 PM 12/07/2016    2:09 PM  CBC  WBC 4.0 - 10.5 K/uL 10.4  9.3  10.1   Hemoglobin 13.0 - 17.0 g/dL 10.7  16.8  15.7   Hematocrit 39.0 - 52.0 % 33.5  47.6  44.2   Platelets 150 - 400 K/uL 226  255  213        Latest Ref Rng & Units 11/17/2022    8:48 PM 07/17/2021    2:08 PM 02/04/2020    2:06 PM  BMP  Glucose 70 - 99 mg/dL 149  269  171   BUN 6 - 20 mg/dL 10  16  12    Creatinine 0.61 - 1.24 mg/dL 1.11  1.05  1.01   BUN/Creat Ratio 9 - 20  15  12    Sodium 135 - 145 mmol/L 135  138  138   Potassium 3.5 - 5.1 mmol/L 3.9  4.7  4.6   Chloride 98 - 111 mmol/L 104  96  98   CO2 22 - 32 mmol/L 21  19  24    Calcium 8.9 - 10.3 mg/dL 9.0  10.5  10.3       Physical Exam: Lower Extremity Exam Vasc: R - PT palpable, DP palpable. Cap refill < 3 sec to digits aside from 2nd toe  L - PT palpable, DP palpable. Cap refill <3 sec to digits  Derm: R - necrotic ulceration with erythema of 2nd toe right foot Ankle ulceration with fibrotic base to level of subq fat       L - Edema and erythema of the 2nd toe. Plantar forefoot ucleration  to subq fat level sub 2nd/3rd mets. Ulceration sub 5th met head.       MSK:  R - Edema of 2nd toe, prominence lateral ankle  L - edema of 2nd toe  Neuro: R - Gross sensation absent to forefoot. Gross motor function intact   L - Gross sensation absent to forefoot. Gross motor function intact  Imaging:  MRI R foot: 1. Acute OM 2nd toe. 2. Possible OM 2nd met head vs reactive change. 3. Bone marrow edema lateral malleolus possible OM.   MRI L foot: pending  XR R foot: Bony destruction of the middle phalanx of the second toe and irregularity of the tuft of the distal phalanx concerning for osteomyelitis. There is overlying soft tissue defect.  XR R ankle: No acute fx or erosions, soft tissue edema  XR L foot: Extensive bony destruction involving the left second toe throughout the  distal, middle and proximal phalanx and head of the second metatarsal compatible with osteomyelitis. There is pronounced periosteal reaction and bony irregularity of the metatarsal shaft concerning for additional involvement.   ASSESSMENT/PLAN OF CARE 37 y.o. male with PMHx significant for  T2DM with bilateral foot ulcerations and osteomyelitis, Right foot with ankle ulceration to subcatenous fat tissue without evidence of underlying OM, and 2nd toe ulceration with chronic OM. Left foot with 2nd toe ulceration with chronic OM of the 2nd ray as well as ulceration plantar 5th met head.    - NPO past MN for OR tomorrow AM at 0730 for Right 2nd toe amputation at MPJ level, ankle debridement and possible bone biopsy; Left foot partial 2nd ray amputation, possible 5th met head resection vs debridement of ulcer.  - Cancelled MRI R ankle as the foot view caught some of lateral malleolus. Will proceed with bone biopsy there.  - Rec ID consult for help with long term IV abx which will likely be required in this case - Continue IV abx broad spectrum pending further culture data - Anticoagulation: hold, per primary -  Wound care: dry dressing prior to OR - WB status: WB at tolerated BLE - Will continue to follow   Thank you for the consult.  Please contact me directly with any questions or concerns.           Everitt Amber, DPM [facility]    [address], [place] [address]                Office [phone]  Fax [phone]

## 2022-11-18 NOTE — Progress Notes (Signed)
   Subjective:   Patient interviewed at bedside.  He expresses his concern regarding need for amputation. No pain today. Otherwise, no new complaints today.  Discussed plan to continue with antibiotics and follow-up on podiatry recommendations today.    Objective:  Vital signs in last 24 hours: Vitals:   11/17/22 1457 11/17/22 2053 11/18/22 0040 11/18/22 0451  BP: 113/64 100/65 102/66 113/70  Pulse: 91 78 74 71  Resp: 14 16 16 16   Temp: 99.2 F (37.3 C) 100 F (37.8 C) 98.9 F (37.2 C) 98.7 F (37.1 C)  TempSrc: Oral Oral Oral Oral  SpO2: 100% 99% 97% 99%  Height: 5\' 11"  (1.803 m)      Physical Exam: General: Alert, not in acute distress HEENT: Normocephalic, atraumatic CV: RRR, no murmurs, rubs, or gallops Pulm: Normal respiratory effort, no wheezes or crackles Abd: Soft, non-tender, non-distended.  Skin: bilateral foot wounds wrapped  Extremities: no LE edema.  Neuro: Alert and oriented x4. Non-focal.  Psych: sad affect  Assessment/Plan:  Principal Problem:   Diabetic osteomyelitis ([place]) Active Problems:   Osteomyelitis of left foot (HCC)  Carl Conley is a 37 year old man with a PMHx significant for T2DM, NAFLD, ADHD, and Depression who presents for bilateral foot wounds and admitted for management of bilateral osteomyelitis.    Bilateral Feet Osteomyelitis 2/2 to diabetic neuropathy Presented w/ worsening bilateral foot wounds over the last 6 months. Previously seen in clinic, started on augmentin/doxycycline w/o resolution. Feet XRAY from 1/11 demonstrate bony destruction of the second toes bilaterally, and bony irregularity and periosteal reaction of the left metatarsal shaft consistent with osteomyelitis. CRP elevated at 1.1. ESR elevated at 49. MRI of L/R foot on admission with osteo. Will continue w/ IV antibiotics. No pain today. Podiatry consulted and plan for right 2nd toe amputation, ankle debridement, possible bone biopsy, left foot partial 2nd ray  amputation, and possible 5th met head resection vs debridement of ulcer tomorrow morning.  -F/u Podiatry recommendations -IV Vancomycin per pharmacy -IV Cefepime per pharmacy -Trend CBC -NPO at midnight -PT/OT after surgery   T2DM A1c 6.5% from 10/2022. CBGs WNL since admission. On Invokamet 150-1000mg  BID at home.  -Hold home Invokamet -SSI  -Trend CBGs   Hx of depression Chronic condition. Takes citalopram at home.  -Continue home citalopram 20mg    Hx of HSV keratitis Diagnosed in 2017. Seen by Dr. Katy Fitch, ophthalmology. Takes acyclovir at home.  -Continue home acyclovir 400mg  BID   DVT Prophylaxis: none Diet: Regular Bowel: miralax PRN IVF: none Code: Full   Prior to Admission Living Arrangement: home w/ mother Anticipated Discharge Location: TBD Barriers to Discharge: continued management Dispo: Anticipated discharge in approximately more than 2 day(s).   Starlyn Skeans, MD 11/18/2022, 6:46 AM Pager: [phone] After 5pm on weekdays and 1pm on weekends: On Call pager [phone]

## 2022-11-18 NOTE — Progress Notes (Signed)
OT Cancellation Note  Patient Details Name: RODRIGO MCGRANAHAN MRN: [id number] DOB: [birth year]   Cancelled Treatment:    Reason Eval/Treat Not Completed: OT screened, no needs identified, will sign off. Patient reports independence with ambulation and ADLs. Reports no pain in feet. No difficulty with ambulation currently.   Basia Mcginty L Cyd Hostler 11/18/2022, 11:37 AM

## 2022-11-19 ENCOUNTER — Encounter (HOSPITAL_COMMUNITY): Payer: Self-pay | Admitting: Internal Medicine

## 2022-11-19 ENCOUNTER — Other Ambulatory Visit: Payer: Self-pay

## 2022-11-19 ENCOUNTER — Inpatient Hospital Stay (HOSPITAL_COMMUNITY): Payer: Medicaid Other

## 2022-11-19 ENCOUNTER — Encounter (HOSPITAL_COMMUNITY)
Disposition: A | Discharge status: Home Health | Payer: Self-pay | Source: Ambulatory Visit | Attending: Internal Medicine

## 2022-11-19 ENCOUNTER — Inpatient Hospital Stay (HOSPITAL_COMMUNITY): Payer: Medicaid Other | Admitting: Certified Registered Nurse Anesthetist

## 2022-11-19 DIAGNOSIS — E119 Type 2 diabetes mellitus without complications: Secondary | ICD-10-CM

## 2022-11-19 DIAGNOSIS — M86172 Other acute osteomyelitis, left ankle and foot: Secondary | ICD-10-CM

## 2022-11-19 DIAGNOSIS — M86171 Other acute osteomyelitis, right ankle and foot: Secondary | ICD-10-CM

## 2022-11-19 DIAGNOSIS — S91301D Unspecified open wound, right foot, subsequent encounter: Secondary | ICD-10-CM

## 2022-11-19 DIAGNOSIS — M8618 Other acute osteomyelitis, other site: Secondary | ICD-10-CM

## 2022-11-19 DIAGNOSIS — L97314 Non-pressure chronic ulcer of right ankle with necrosis of bone: Secondary | ICD-10-CM

## 2022-11-19 DIAGNOSIS — Z794 Long term (current) use of insulin: Secondary | ICD-10-CM

## 2022-11-19 DIAGNOSIS — S91302D Unspecified open wound, left foot, subsequent encounter: Secondary | ICD-10-CM

## 2022-11-19 HISTORY — PX: AMPUTATION: SHX166

## 2022-11-19 LAB — GLUCOSE, CAPILLARY
Glucose-Capillary: 127 mg/dL — ABNORMAL HIGH (ref 70–99)
Glucose-Capillary: 133 mg/dL — ABNORMAL HIGH (ref 70–99)
Glucose-Capillary: 166 mg/dL — ABNORMAL HIGH (ref 70–99)
Glucose-Capillary: 179 mg/dL — ABNORMAL HIGH (ref 70–99)
Glucose-Capillary: 176 mg/dL — ABNORMAL HIGH (ref 70–99)

## 2022-11-19 SURGERY — AMPUTATION DIGIT
Anesthesia: General | Site: Foot | Laterality: Right

## 2022-11-19 MED ORDER — OXYCODONE HCL 5 MG PO TABS: 10.0000 mg | ORAL_TABLET | Freq: Four times a day (QID) | ORAL | Status: DC | PRN

## 2022-11-19 MED ORDER — ONDANSETRON HCL 4 MG/2ML IJ SOLN
INTRAMUSCULAR | Status: AC
  Filled 2022-11-19: qty 2

## 2022-11-19 MED ORDER — SODIUM CHLORIDE 0.9 % IR SOLN
Status: DC | PRN
  Administered 2022-11-19: 3000 mL

## 2022-11-19 MED ORDER — EPHEDRINE SULFATE-NACL 50-0.9 MG/10ML-% IV SOSY
PREFILLED_SYRINGE | INTRAVENOUS | Status: DC | PRN
  Administered 2022-11-19 (×5): 5 mg via INTRAVENOUS

## 2022-11-19 MED ORDER — FENTANYL CITRATE (PF) 100 MCG/2ML IJ SOLN: 25.0000 ug | INTRAMUSCULAR | Status: DC | PRN

## 2022-11-19 MED ORDER — MIDAZOLAM HCL 2 MG/2ML IJ SOLN
INTRAMUSCULAR | Status: DC | PRN
  Administered 2022-11-19: 2 mg via INTRAVENOUS

## 2022-11-19 MED ORDER — FENTANYL CITRATE (PF) 250 MCG/5ML IJ SOLN
INTRAMUSCULAR | Status: AC
  Filled 2022-11-19: qty 5

## 2022-11-19 MED ORDER — EPHEDRINE 5 MG/ML INJ
INTRAVENOUS | Status: AC
  Filled 2022-11-19: qty 5

## 2022-11-19 MED ORDER — THROMBIN 5000 UNITS EX SOLR
CUTANEOUS | Status: DC | PRN
  Administered 2022-11-19: 5000 [IU] via TOPICAL

## 2022-11-19 MED ORDER — BUPIVACAINE HCL (PF) 0.25 % IJ SOLN
INTRAMUSCULAR | Status: AC
  Filled 2022-11-19: qty 30

## 2022-11-19 MED ORDER — THROMBIN 5000 UNITS EX SOLR
CUTANEOUS | Status: AC
  Filled 2022-11-19: qty 5000

## 2022-11-19 MED ORDER — PROPOFOL 10 MG/ML IV BOLUS
INTRAVENOUS | Status: DC | PRN
  Administered 2022-11-19: 150 mg via INTRAVENOUS

## 2022-11-19 MED ORDER — PROPOFOL 10 MG/ML IV BOLUS
INTRAVENOUS | Status: AC
  Filled 2022-11-19: qty 20

## 2022-11-19 MED ORDER — BUPIVACAINE HCL (PF) 0.5 % IJ SOLN
INTRAMUSCULAR | Status: AC
  Filled 2022-11-19: qty 30

## 2022-11-19 MED ORDER — 0.9 % SODIUM CHLORIDE (POUR BTL) OPTIME
TOPICAL | Status: DC | PRN
  Administered 2022-11-19: 1000 mL

## 2022-11-19 MED ORDER — ONDANSETRON HCL 4 MG/2ML IJ SOLN
INTRAMUSCULAR | Status: DC | PRN
  Administered 2022-11-19: 4 mg via INTRAVENOUS

## 2022-11-19 MED ORDER — MIDAZOLAM HCL 2 MG/2ML IJ SOLN
INTRAMUSCULAR | Status: AC
  Filled 2022-11-19: qty 2

## 2022-11-19 MED ORDER — OXYCODONE HCL 5 MG PO TABS: 5.0000 mg | ORAL_TABLET | Freq: Once | ORAL | Status: DC | PRN

## 2022-11-19 MED ORDER — FENTANYL CITRATE (PF) 250 MCG/5ML IJ SOLN
INTRAMUSCULAR | Status: DC | PRN
  Administered 2022-11-19 (×2): 25 ug via INTRAVENOUS
  Administered 2022-11-19: 50 ug via INTRAVENOUS

## 2022-11-19 MED ORDER — ACETAMINOPHEN 500 MG PO TABS
1000.0000 mg | ORAL_TABLET | Freq: Three times a day (TID) | ORAL | Status: DC
  Administered 2022-11-19 – 2022-12-03 (×36): 1000 mg via ORAL
  Filled 2022-11-19 (×39): qty 2

## 2022-11-19 MED ORDER — LIDOCAINE 2% (20 MG/ML) 5 ML SYRINGE
INTRAMUSCULAR | Status: DC | PRN
  Administered 2022-11-19: 100 mg via INTRAVENOUS

## 2022-11-19 MED ORDER — ACETAMINOPHEN 650 MG RE SUPP
650.0000 mg | Freq: Three times a day (TID) | RECTAL | Status: DC
  Filled 2022-11-19: qty 1

## 2022-11-19 MED ORDER — ONDANSETRON HCL 4 MG/2ML IJ SOLN: 4.0000 mg | Freq: Once | INTRAMUSCULAR | Status: DC | PRN

## 2022-11-19 MED ORDER — PHENYLEPHRINE HCL-NACL 20-0.9 MG/250ML-% IV SOLN
INTRAVENOUS | Status: DC | PRN
  Administered 2022-11-19: 40 ug/min via INTRAVENOUS

## 2022-11-19 MED ORDER — LACTATED RINGERS IV SOLN: INTRAVENOUS | Status: DC | PRN

## 2022-11-19 MED ORDER — OXYCODONE HCL 5 MG/5ML PO SOLN: 5.0000 mg | Freq: Once | ORAL | Status: DC | PRN

## 2022-11-19 MED ORDER — KETOROLAC TROMETHAMINE 30 MG/ML IJ SOLN: 30.0000 mg | Freq: Once | INTRAMUSCULAR | Status: DC | PRN

## 2022-11-19 SURGICAL SUPPLY — 40 items
BLADE LONG MED 31X9 (MISCELLANEOUS) IMPLANT
BNDG ELASTIC 4X5.8 VLCR STR LF (GAUZE/BANDAGES/DRESSINGS) IMPLANT
BNDG GAUZE DERMACEA FLUFF 4 (GAUZE/BANDAGES/DRESSINGS) IMPLANT
BNDG GZE DERMACEA 4 6PLY (GAUZE/BANDAGES/DRESSINGS) ×4
COVER SURGICAL LIGHT HANDLE (MISCELLANEOUS) ×3 IMPLANT
CUFF TOURN SGL QUICK 24 (TOURNIQUET CUFF)
CUFF TRNQT CYL 24X4X16.5-23 (TOURNIQUET CUFF) IMPLANT
DRAPE SURG 17X23 STRL (DRAPES) ×3 IMPLANT
DRESSING MATRIX 2X2 BILAYER (Tissue) IMPLANT
DRSG ADAPTIC 3X8 NADH LF (GAUZE/BANDAGES/DRESSINGS) IMPLANT
DRSG MATRIX 2X2 BILAYER (Tissue) ×2 IMPLANT
GAUZE PAD ABD 8X10 STRL (GAUZE/BANDAGES/DRESSINGS) IMPLANT
GAUZE SPONGE 2X2 8PLY STRL LF (GAUZE/BANDAGES/DRESSINGS) IMPLANT
GAUZE SPONGE 4X4 12PLY STRL (GAUZE/BANDAGES/DRESSINGS) IMPLANT
GAUZE XEROFORM 1X8 LF (GAUZE/BANDAGES/DRESSINGS) IMPLANT
GLOVE BIOGEL M 7.0 STRL (GLOVE) ×3 IMPLANT
GLOVE PI ORTHO PRO STRL 7.5 (GLOVE) ×3 IMPLANT
GOWN STRL REUS W/ TWL LRG LVL3 (GOWN DISPOSABLE) ×6 IMPLANT
GOWN STRL REUS W/TWL LRG LVL3 (GOWN DISPOSABLE) ×4
KIT BASIN OR (CUSTOM PROCEDURE TRAY) ×3 IMPLANT
KIT TURNOVER KIT B (KITS) ×3 IMPLANT
NDL HYPO 25GX1X1/2 BEV (NEEDLE) IMPLANT
NEEDLE HYPO 25GX1X1/2 BEV (NEEDLE) IMPLANT
NS IRRIG 1000ML POUR BTL (IV SOLUTION) ×3 IMPLANT
PACK ORTHO EXTREMITY (CUSTOM PROCEDURE TRAY) ×3 IMPLANT
PAD ARMBOARD 7.5X6 YLW CONV (MISCELLANEOUS) ×6 IMPLANT
SOL PREP POV-IOD 4OZ 10% (MISCELLANEOUS) ×9 IMPLANT
SPECIMEN JAR SMALL (MISCELLANEOUS) ×3 IMPLANT
SPONGE SURGIFOAM ABS GEL SZ50 (HEMOSTASIS) IMPLANT
STAPLER VISISTAT 35W (STAPLE) IMPLANT
SUT ETHILON 3 0 PS 1 (SUTURE) ×3 IMPLANT
SUT PROLENE 3 0 PS 2 (SUTURE) IMPLANT
SWAB COLLECTION DEVICE MRSA (MISCELLANEOUS) IMPLANT
SWAB CULTURE ESWAB REG 1ML (MISCELLANEOUS) IMPLANT
SYR CONTROL 10ML LL (SYRINGE) IMPLANT
TOWEL GREEN STERILE (TOWEL DISPOSABLE) ×3 IMPLANT
TOWEL GREEN STERILE FF (TOWEL DISPOSABLE) ×3 IMPLANT
TUBE CONNECTING 12X1/4 (SUCTIONS) IMPLANT
UNDERPAD 30X36 HEAVY ABSORB (UNDERPADS AND DIAPERS) ×3 IMPLANT
WATER STERILE IRR 1000ML POUR (IV SOLUTION) ×3 IMPLANT

## 2022-11-19 NOTE — Op Note (Signed)
Full Operative Report  Date of Operation: 9:12 AM, 11/19/2022   Patient: Carl Conley - 37 y.o. male  Surgeon: Yevonne Pax, DPM   Assistant: None  Diagnosis: Ulceration right ankle, Osteomyelitis right distal fibula, Ulceration and osteomyelitis Right 2nd toe, Osteomyelitis Left 2nd toe and metatarsal, ulceration left plantar forefoot to subcutaneous fat, DM 2 with neuropathy.   Procedure:   Right foot: Excisional debridement of ulceration to level of bone, 2.5 x 2.5 x 0.4 cm, R ankle Bone biopsy distal fibula, right ankle Amputation of 2nd toe at metatarsal phalangeal joint level, Right foot  Left foot: Partial 2nd ray amputation, left foot 3rd metatarsal head bone biopsy, left foot 1st metatarsal head bone biopsy, left foot    Anesthesia: Florentina Addison, MD  Anesthesiologist: Myrtie Soman, MD CRNA: Michele Rockers, CRNA   Estimated Blood Loss: 50 mL  Hemostasis: 1) Anatomical dissection, mechanical compression, electrocautery 2) No tourniquet used for right foot, Left ankle tourniquet inflated for 35 min to 250 mm Hg  Implants: Implant Name Type Inv. Item Serial No. Manufacturer Lot No. LRB No. Used Action  DRSG MATRIX 2X2 BILAYER - [id number] Tissue DRSG MATRIX 2X2 BILAYER  [employer] [id number] Right 1 Implanted    Materials: Prolene, skin staples, gel foam and thrombin  Injectables: 1) Pre-operatively: None 2) Post-operatively: None  Specimens: Right foot bone from distal fibula for pathology and culture. R 2nd toe for pathology, R 2nd MPJ deep culture. Left 2nd toe and metatarsal for pathology, L 2nd met head for culture, Left 2nd MPJ deep culture, L 3rd met head for pathology, L 1st met head bone for pathology.    Antibiotics: Abx given as scheduled from the floor.   Drains: None  Complications: Patient tolerated the procedure well without complication.   Findings: as below  Indications for Procedure: Carl Conley  presents to Yevonne Pax, DPM with a chief complaint of bilateral foot ulcerations. Right 2nd toe OM and R ankle ulceration with concern for underlying OM.  The patient has failed conservative treatments of various modalities. At this time the patient has elected to proceed with surgical correction. All alternatives, risks, and complications of the procedures were thoroughly explained to the patient. Patient exhibits appropriate understanding of all discussion points and informed consent was signed and obtained in the chart with no guarantees to surgical outcome given or implied.  Description of Procedure: Patient was brought to the operating room and placed on the operative table in the supine position. Patient was secured to the table with safety belt, a contralateral SCD was placed, and all bony prominences were well padded. A surgical timeout was performed and all members of the operating room, the procedure, and the surgical site were identified. General LMA anesthesia occurred.  A well padded pneumatic tourniquet was placed to the Left ankle. The bilateral lower extremity was then prepped and draped in the usual sterile manner. The following procedure then began.  Attention was directed to the right ankle ulceration. The wound was completely inspected. The wound base had fibrotic tissue. The wound did probe down to distal fibula bone. The wound was then prepped for graft coverage. The wound margins were sharply curretaged. The entire wound base thoroughly debrided with curettes and rongeurs to the level of healthy viable tissue. Bone biopsy was then performed through the open wound. Next, using a rongeur 2 samples of distal fibula were removed. One for pathology and one for culture. Bone was noted to be slightly  soft. These were passed to back table and sent to the lab. The wound measured 2.5 x2.5 x 0.4 cm. Hemostasis was achieved with electrocautery. The acellular integra graft was then  placed within the wound bed, covering the entire wound. This was then affixed with surgical staples.    Attention was directed to the 2nd digit on the right foot. A full-thickness incision encompassing the entire digit was made using a #15 blade. Dissection was carried down to bone. The toe was secured with a towel clamp, further dissected in its entirety, and disarticulated at the MPJ and passed to the back table as a gross specimen. This was then labled and sent to pathology. The bone was noted to be soft and eroded, and consistent with osteomyelitis. All remaining necrotic and devitalized soft tissue structures were visualized and dissected away using sharp and dull dissection. Care was taken to protect all neurovascular structures throughout the dissection. All bleeders were cauterized as necessary. A deep tissue culture was obtained at this time. The area was then flushed with copious amounts of sterile saline. Then using the suture materials previously described, the site was closed in anatomic layers and the skin was well approximated under minimal tension.  Attention was then directed to the left foot, the tourniquet was inflated. Attention was directed to the left lower extremity. A racquet type incision was made over the dorsal medial aspect of the 2nd metatarsal, including the entire digit. A full thickness incision was made down to bone using a #15 blade. The incision was continued through the soft tissue down to the shaft of the 2nd metatarsal. At this time, no purulent drainage was noted near the metatarsal head. A deep wound culture was taken at this time and passed off the field. Using a #15 blade, the digit was then disarticulated in its entirety at the metatarsophalangeal joint and freed of all soft tissue attachments. The specimen was passed off the field and sent for gross pathology. Bone of the 2nd metatarsal was soft and necrotic.Using a sagittal saw, the metatarsal was cut proximally. A  bone culture was taken at this time of the 2nd met head. The bone was passed off the field and sent as a gross specimen.  All remaining non-viable and necrotic tissues were sharply resected and removed. Extensor and flexors tendons were grasped with a hemostat and cut proximally.  Next the ulceration of the plantar foot was excised with 15 blade. The wound probed to 3rd met head which was noted to be soft and necrotic. Bone biopsy was performed. Sagittal saw was used to resect the distal aspect of the 3rd met head. This was sent for pathology. Next attention was directed to the 1st metatarsal. The 1st met head was dissected to through the open wound. A rongeur was then used to take a bone biopsy of the dorsal 1st met head. This was sent to pathology. The surgical site was then flushed with of saline with pulse lavage. Next, gel foam and thrombin was packed into the surgical site. This was covered with further sterile saline soaked gauze packing.   The surgical site was then dressed with betadine adaptic to right foot 2nd toe amp followed by saline gauze for right ankle and dsd. The left foot with packed with sterile saline and covered with dsd. The tourniquet was deflated after 35 minutes to Left foot with immediate vascular return noted to the operative foot. The patient tolerated both the procedure and anesthesia well with vital signs  stable throughout. The patient was transferred from the OR to recovery under the discretion of anesthesia.  Condition: Patient was taken to PACU in good condition and all vital signs stable and neurovascular status intact to the operative limb.  Discharge: Pt will be readmitted to the floors for ongoing IV abx, wound care and further OR later this week.   [facility] will follow the patient throughout the entire post-operative course and the patient is aware of all post-operative protocols in place. The patient will follow the protocol of rest, ice, and  elevation. The patient will be WBAT in a Post op shoe to RLE and NWB to the LLE The dressing is to remain clean, dry, and intact.   Pt will require return to OR later this week for Transmetatarsal amputation on th left foot 2nd to tissue loss and concern for OM given intra op findings in the 3rd met head and 1st met.   Carl Conley, DPM

## 2022-11-19 NOTE — Progress Notes (Signed)
   Subjective:  [name], Lovenox held for amputation today.  Patient assessed about 1 hour postop.  Completed debridement of right ankle with ulceration, distal fibula biopsy, amputation of second toe at MTP.  Left second ray amputation, first/third metatarsal head biopsy.  Not feeling nauseous, he is hungry and eager to eat lunch. He is not having any pain right now. Feeling alright overall.  He understands that the plan is for left MTA on Thursday, plans for right lower extremity currently pending biopsy results.  ID consulted for antibiotic planning.  He is aware that he needs to go back to the OR on Thursday for left transmetatarsal amputation  His mom does not have a working phone. She would probably go to a neighbor if she needed help.  He was previously given the nonemergent police phone number in case he wants to call wellness check.  Objective:  Vital signs in last 24 hours: Vitals:   11/18/22 1000 11/18/22 1559 11/18/22 2122 11/19/22 0411  BP: 113/76 112/73 97/76 94/70   Pulse: 67 72 77 74  Resp: 18 18 18 18   Temp: 98 F (36.7 C) 98 F (36.7 C) 98.2 F (36.8 C) 98.1 F (36.7 C)  TempSrc:   Oral   SpO2: 98% 100% 99% 99%  Height:       Physical Exam: General: Alert, not in acute distress Cardiac: RRR, no MRG respiratory: CTAB, no wheezes, crackles, rales Extremities: Bilateral feet covered in dressings. Neuro: Alert and oriented x4.    Assessment/Plan:  Principal Problem:   Diabetic osteomyelitis ([place]) Active Problems:   Osteomyelitis of left foot (HCC)  Carl Conley is a 37 year old man with a PMHx significant for T2DM, NAFLD, ADHD, and Depression who presents for bilateral foot wounds and admitted for management of bilateral osteomyelitis.    Bilateral Feet Osteomyelitis 2/2 to diabetic neuropathy Status post bilateral partial lower extremity amputations Presented w/ worsening bilateral foot wounds over the last 6 months. Previously seen in clinic, started on  augmentin/doxycycline w/o resolution. Feet XRAY from 1/11 demonstrate bony destruction of the second toes bilaterally, and bony irregularity and periosteal reaction of the left metatarsal shaft consistent with osteomyelitis. CRP elevated at 1.1. ESR elevated at 49. MRI of L/R foot on admission with osteo.  Doing well immediately postop today and will need to return to the OR on Thursday for left transmetatarsal amputation.  ID consulted, will continue w/ IV antibiotics.  -F/u Podiatry, ID Rex  recs.  Monitor biopsy results.   -IV Vancomycin per pharmacy -IV Cefepime per pharmacy -Trend CBC -Scheduled Tylenol 1000 3 times daily, oxycodone 10 every 8 for severe pain -PT/OT after follow-up surgery   T2DM A1c 6.5% from 10/2022. CBGs WNL since admission. On Invokamet 150-1000mg  BID at home.  -Hold home Invokamet -SSI  -Trend CBGs   Hx of depression Chronic condition. Takes citalopram at home.  -Continue home citalopram 20mg    Hx of HSV keratitis Diagnosed in 2017. Seen by Dr. Katy Fitch, ophthalmology. Takes acyclovir at home.  -Continue home acyclovir 400mg  BID   DVT Prophylaxis: Held Diet: Carb modified Bowel: miralax PRN IVF: none Code: Full   Prior to Admission Living Arrangement: home w/ mother Anticipated Discharge Location: TBD Barriers to Discharge: continued management Dispo: Anticipated discharge in approximately more than 2 day(s).   Carl Galas, MD 11/19/2022, 6:26 AM Pager: [phone] After 5pm on weekdays and 1pm on weekends: On Call pager [phone]

## 2022-11-19 NOTE — Progress Notes (Addendum)
PODIATRY PROGRESS NOTE Patient Name: Carl Conley  DOB 1986/06/04 DOA 11/17/2022  Hospital Day: 3  Assessment:  37 y.o. male with PMHx significant for  T2DM with bilateral foot ulcerations and osteomyelitis, Right foot with ankle ulceration to subcatenous fat tissue without evidence of underlying OM, and 2nd toe ulceration with chronic OM. Left foot with 2nd toe ulceration with chronic OM of the 2nd ray as well as ulceration plantar 5th met head.    AF, VSS  Wound/Bone Cultures: pending OR  Imaging:   MRI R foot: 1. Acute OM 2nd toe. 2. Possible OM 2nd met head vs reactive change. 3. Bone marrow edema lateral malleolus possible OM.    MRI L foot: OM 2nd ray, 3rd ray, possible 1st ray, possible OM 5th met head and proximal phalanx.   XR R foot: Bony destruction of the middle phalanx of the second toe and irregularity of the tuft of the distal phalanx concerning for osteomyelitis. There is overlying soft tissue defect.   XR R ankle: No acute fx or erosions, soft tissue edema   XR L foot: Extensive bony destruction involving the left second toe throughout the distal, middle and proximal phalanx and head of the second metatarsal compatible with osteomyelitis. There is pronounced periosteal reaction and bony irregularity of the metatarsal shaft concerning for additional involvement.  Plan:  - NPO for OR for Right 2nd toe amputation at MPJ level, ankle debridement and possible bone biopsy; Left foot partial 2nd ray amputation, possible 5th met head resection vs debridement of ulcer. - Will likely need return to OR later this week. Given significant concern for Osteo on MRI may ultimately need transmetatarsal amputation of the Left foot.  - Rec ID consult for help with long term IV abx which will likely be required in this case - Continue IV abx broad spectrum pending further culture data - Anticoagulation: hold, per primary - Wound care: dry dressing prior to OR - WB status: WB at  tolerated BLE Will continue to follow        Everitt Amber, [place]    Subjective:  Pt seen in pre op, aware of plans today, all questions answered.   Objective:   Vitals:   11/18/22 2122 11/19/22 0411  BP: 97/76 94/70  Pulse: 77 74  Resp: 18 18  Temp: 98.2 F (36.8 C) 98.1 F (36.7 C)  SpO2: 99% 99%       Latest Ref Rng & Units 11/17/2022    8:48 PM 02/04/2020    2:06 PM 12/07/2016    2:09 PM  CBC  WBC 4.0 - 10.5 K/uL 10.4  9.3  10.1   Hemoglobin 13.0 - 17.0 g/dL 10.7  16.8  15.7   Hematocrit 39.0 - 52.0 % 33.5  47.6  44.2   Platelets 150 - 400 K/uL 226  255  213        Latest Ref Rng & Units 11/17/2022    8:48 PM 07/17/2021    2:08 PM 02/04/2020    2:06 PM  BMP  Glucose 70 - 99 mg/dL 149  269  171   BUN 6 - 20 mg/dL 10  16  12    Creatinine 0.61 - 1.24 mg/dL 1.11  1.05  1.01   BUN/Creat Ratio 9 - 20  15  12    Sodium 135 - 145 mmol/L 135  138  138   Potassium 3.5 - 5.1 mmol/L 3.9  4.7  4.6  Chloride 98 - 111 mmol/L 104  96  98   CO2 22 - 32 mmol/L 21  19  24    Calcium 8.9 - 10.3 mg/dL 9.0  10.5  10.3     General: AAOx3, NAD  Lower Extremity Exam Lower Extremity Exam Vasc:     R - PT palpable, DP palpable. Cap refill < 3 sec to digits aside from 2nd toe               L - PT palpable, DP palpable. Cap refill <3 sec to digits   Derm:    R - necrotic ulceration with erythema of 2nd toe right foot Ankle ulceration with fibrotic base to level of subq fat                       L - Edema and erythema of the 2nd toe. Plantar forefoot ucleration to subq fat level sub 2nd/3rd mets. Ulceration sub 5th met head.          MSK:     R - Edema of 2nd toe, prominence lateral ankle               L - edema of 2nd toe   Neuro:   R - Gross sensation absent to forefoot. Gross motor function intact                    L - Gross sensation absent to forefoot. Gross motor function intact   Radiology:  Results reviewed. See assessment for pertinent  imaging results

## 2022-11-19 NOTE — Consult Note (Signed)
[facility]    Date of Admission:  11/17/2022     Reason for Consult: bilateral multifocal diabetic foot infection with om    Referring Provider: Criselda Peaches    Abx: 1/13-c vanc 1/13-c cefepime        Assessment: 37 yo male with dm2, depression/adhd, chronic bilateral foot wounds admitted due to progression found on mri imaging with left foot abscess and bilateral foot multifocal OM   1/15 operative cx in progress: Left/right foot several samples sent  I can't imagine he is source controlled in terms of necrotic burden removal. We can attempt to try 4-6 weeks of antibiotics.   It is proven that oral abx for diabetic foot OM would be similar to IV in terms of efficacy. If cultures allow, we can discharge on oral abx regimen   More I&D to be done this week  Plan: Continue current abx F/u cultures If oral abx options available will send out on that Avoid picc for now Consider ABI if not already done Discussed with primary team      ------------------------------------------------ Principal Problem:   Diabetic osteomyelitis (HCC) Active Problems:   Osteomyelitis of left foot (HCC)    HPI: Carl Conley is a 37 y.o. male dm2, NAFLD, ADHD, Depression admitted 11/17/22 with bilateral chronic foot ulcers  He has had ulcers for several months without medical care; he uses topical abx and dressing and cared for the wounds himself. But they all had gotten worse  He was first seen for this at [facility] 12/28 and given amox/clav with doxy for 2 weeks.   He has chronic bilateral neuropathy  No fever/chill  On admission Afebrile; hds Mri feet showed multifocal om changes along with soft tissue abscess in left foot Podiatry was consulted  Pictures are available of the feet -- left 2nd toe cellulitis/swelling, distal forefoot ulcer with eschar; right foot lateral malleolus ulcer, 2nd distal toe gangrene  Patient underwent: 1/15  Procedure(s): RIGHT FOOT SECOND TOE AMPUTATION, ANKLE WOUND DEBRIDEMENT AND BONE BIOPSY (Right) LEFT FOOT PARTIAL SECOND RAY AMPUTATION, LEFT THIRD METATARSAL BONE BIOPSY, LEFT FIRST METATARSAL BONE BIOPSY (Left) Full operative note not yet available  His A1c is in the 6's   No n/v/diarrhea/rash    Family History  Problem Relation Age of Onset   Depression Mother     Social History   Tobacco Use   Smoking status: Never   Smokeless tobacco: Never  Vaping Use   Vaping Use: Never used  Substance Use Topics   Alcohol use: No   Drug use: No    No Known Allergies  Review of Systems: ROS All Other ROS was negative, except mentioned above   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   Depression 10/31/2016   Diabetes mellitus (HCC)    dx date 2004 at age 81   Low back pain 09/01/2016   Visual snow syndrome        Scheduled Meds:  acetaminophen  1,000 mg Oral TID   Or   acetaminophen  650 mg Rectal TID   acyclovir  400 mg Oral BID   citalopram  20 mg Oral Daily   insulin aspart  0-15 Units Subcutaneous TID WC   insulin aspart  0-5 Units Subcutaneous QHS   Continuous Infusions:  ceFEPime (MAXIPIME) IV 2 g (11/19/22 0519)   vancomycin 1,250 mg (11/18/22 2259)  PRN Meds:.oxyCODONE, polyethylene glycol   OBJECTIVE: Blood pressure (!) 88/67, pulse 85, temperature 98.5 F (36.9 C), temperature source Oral, resp. rate 16, height 5\' 11"  (1.803 m), SpO2 99 %.  Physical Exam  General/constitutional: no distress, pleasant HEENT: Normocephalic, PER, Conj Clear, EOMI, Oropharynx clear Neck supple CV: rrr no mrg Lungs: clear to auscultation, normal respiratory effort Abd: Soft, Nontender Ext: no edema Skin: No Rash Neuro: nonfocal MSK: bilateral foot with dressing that are clean/dry; exposed toes no cellulitis change    Lab Results Lab Results  Component Value Date   WBC 10.4 11/17/2022   HGB 10.7 (L)  11/17/2022   HCT 33.5 (L) 11/17/2022   MCV 80.9 11/17/2022   PLT 226 11/17/2022    Lab Results  Component Value Date   CREATININE 1.11 11/17/2022   BUN 10 11/17/2022   NA 135 11/17/2022   K 3.9 11/17/2022   CL 104 11/17/2022   CO2 21 (L) 11/17/2022    Lab Results  Component Value Date   ALT 25 11/17/2022   AST 32 11/17/2022   ALKPHOS 71 11/17/2022   BILITOT 0.4 11/17/2022      Microbiology: No results found for this or any previous visit (from the past 240 hour(s)).   Serology:    Imaging: If present, new imagings (plain films, ct scans, and mri) have been personally visualized and interpreted; radiology reports have been reviewed. Decision making incorporated into the Impression / Recommendations.  1/14 mri left foot 1. Severe changes of acute osteomyelitis of the second ray with bony destruction of the second toe and distal aspect of the second metatarsal. Bony involvement of the second ray extends proximally to the second metatarsal base. Septic arthritis of the second MTP joint and interphalangeal joints of the second toe. 2. Acute osteomyelitis of the third metatarsal as well as the third toe proximal phalanx with septic arthritis of the third MTP joint. 3. Bone marrow edema and enhancement within the first metatarsal head and neck and within the proximal phalanx of the great toe. Complex first MTP joint effusion. Findings are suspicious for septic arthritis with osteomyelitis. 4. Marrow edema is present within the fifth metatarsal head and base of the fifth toe proximal phalanx. Osteomyelitis at these locations is not excluded. 5. Marked changes of cellulitis of the second toe and forefoot. Lobulated fluid collection surrounds the area of bony destruction along the course of the second ray. Approximate measurements of 4.5 x 0.9 x 1.7 cm. Additional smaller abscess along the dorsomedial margin of the second MTP joint measuring approximately 1.9 x 0.7 x 0.9  cm. 6. Diffuse myositis throughout the forefoot centered at the second metatarsal. 7. Prominent flexor tenosynovitis of the second toe.   1/14 mri right foot 1. Acute osteomyelitis of the middle and distal phalanx of the second toe with bony destruction. Small joint effusion of the PIP joint of the second toe is suspicious for septic arthritis. Bone marrow edema within the proximal phalanx of the second toe is suggestive of early osteomyelitis. 2. Bone marrow edema within the second metatarsal head. There is also marrow edema within the fourth metatarsal shaft. These findings are more likely to be be reactive or stress related. 3. Prominent bone marrow edema within the lateral malleolus seen at the edge of the field of view. Correlate for soft tissue infection at this location as osteomyelitis is not excluded. 4. Small focal erosions involving the bases of the fourth, fifth, and first metatarsals. Findings are nonspecific and could reflect  underlying inflammatory arthropathy or sequela of prior osteomyelitis. Acute osteomyelitis at these locations is felt to be less likely given lack of adjacent soft tissue inflammation. 5. Tenosynovitis of the flexor tendon of the second toe distally. 6. Appearance of the foot musculature suggests denervation and/or myositis.   Jabier Mutton, [place] [facility] [place] [phone] pager    11/19/2022, 1:34 PM

## 2022-11-19 NOTE — Transfer of Care (Signed)
Immediate Anesthesia Transfer of Care Note  Patient: Carl Conley  Procedure(s) Performed: RIGHT FOOT SECOND TOE AMPUTATION, ANKLE WOUND DEBRIDEMENT AND BONE BIOPSY (Right: Foot) LEFT FOOT PARTIAL SECOND RAY AMPUTATION, LEFT THIRD METATARSAL BONE BIOPSY, LEFT FIRST METATARSAL BONE BIOPSY (Left: Foot)  Patient Location: PACU  Anesthesia Type:General  Level of Consciousness: drowsy, patient cooperative, and responds to stimulation  Airway & Oxygen Therapy: Patient Spontanous Breathing and Patient connected to nasal cannula oxygen  Post-op Assessment: Report given to RN, Post -op Vital signs reviewed and stable, and Patient moving all extremities X 4  Post vital signs: Reviewed and stable  Last Vitals:  Vitals Value Taken Time  BP 97/66 11/19/22 0915  Temp    Pulse 89 11/19/22 0918  Resp 15 11/19/22 0918  SpO2 100 % 11/19/22 0918  Vitals shown include unvalidated device data.  Last Pain:  Vitals:   11/18/22 2122  TempSrc: Oral  PainSc: 0-No pain         Complications: No notable events documented.

## 2022-11-19 NOTE — Anesthesia Preprocedure Evaluation (Signed)
Anesthesia Evaluation  Patient identified by MRN, date of birth, ID band Patient awake    Reviewed: Allergy & Precautions, H&P , NPO status , Patient's Chart, lab work & pertinent test results  Airway Mallampati: II  TM Distance: >3 FB Neck ROM: Full    Dental no notable dental hx.    Pulmonary neg pulmonary ROS   Pulmonary exam normal breath sounds clear to auscultation       Cardiovascular negative cardio ROS Normal cardiovascular exam Rhythm:Regular Rate:Normal     Neuro/Psych negative neurological ROS  negative psych ROS   GI/Hepatic negative GI ROS, Neg liver ROS,,,  Endo/Other  diabetes, Insulin Dependent    Renal/GU negative Renal ROS  negative genitourinary   Musculoskeletal negative musculoskeletal ROS (+)    Abdominal   Peds negative pediatric ROS (+)  Hematology  (+) Blood dyscrasia, anemia   Anesthesia Other Findings   Reproductive/Obstetrics negative OB ROS                             Anesthesia Physical Anesthesia Plan  ASA: 3  Anesthesia Plan: General   Post-op Pain Management:    Induction: Intravenous  PONV Risk Score and Plan: 2 and Ondansetron and Treatment may vary due to age or medical condition  Airway Management Planned: LMA  Additional Equipment:   Intra-op Plan:   Post-operative Plan: Extubation in OR  Informed Consent: I have reviewed the patients History and Physical, chart, labs and discussed the procedure including the risks, benefits and alternatives for the proposed anesthesia with the patient or authorized representative who has indicated his/her understanding and acceptance.     Dental advisory given  Plan Discussed with: CRNA and Surgeon  Anesthesia Plan Comments:        Anesthesia Quick Evaluation

## 2022-11-19 NOTE — Brief Op Note (Signed)
11/19/2022  9:11 AM  PATIENT:  Carl Conley  37 y.o. male  PRE-OPERATIVE DIAGNOSIS:  Open foot wound  POST-OPERATIVE DIAGNOSIS:  Open foot wound  PROCEDURE:  Procedure(s): RIGHT FOOT SECOND TOE AMPUTATION, ANKLE WOUND DEBRIDEMENT AND BONE BIOPSY (Right) LEFT FOOT PARTIAL SECOND RAY AMPUTATION, LEFT THIRD METATARSAL BONE BIOPSY, LEFT FIRST METATARSAL BONE BIOPSY (Left)  SURGEON:  Surgeon(s) and Role:    * Kitzia Camus, Nena Isael Stille, DPM - Primary  PHYSICIAN ASSISTANT:   ASSISTANTS: none   ANESTHESIA:   general  EBL:  50 mL   BLOOD ADMINISTERED:none  DRAINS: none   LOCAL MEDICATIONS USED:  NONE  SPECIMEN:  Source of Specimen:  multiple right and left foot for path and culture. Please see op report   DISPOSITION OF SPECIMEN:   Pathology and Micro  COUNTS:  YES  TOURNIQUET:   Total Tourniquet Time Documented: Calf (Left) - 35 minutes Total: Calf (Left) - 35 minutes   DICTATION: .Note written in EPIC  PLAN OF CARE: Admit to inpatient   PATIENT DISPOSITION:  PACU - hemodynamically stable.   Delay start of Pharmacological VTE agent (>24hrs) due to surgical blood loss or risk of bleeding: yes

## 2022-11-19 NOTE — Progress Notes (Signed)
Orthopedic Tech Progress Note Patient Details:  Carl Conley 1986-09-10 [id number]  Ortho Devices Type of Ortho Device: Postop shoe/boot Ortho Device/Splint Location: Right foot Ortho Device/Splint Interventions: Ordered, Application   Post Interventions Patient Tolerated: Well Instructions Provided: Adjustment of device  Carl Conley 11/19/2022, 10:51 AM

## 2022-11-19 NOTE — Anesthesia Procedure Notes (Addendum)
Procedure Name: LMA Insertion Date/Time: 11/19/2022 7:43 AM  Performed by: Michele Rockers, CRNAPre-anesthesia Checklist: Patient identified, Emergency Drugs available, Suction available and Patient being monitored Patient Re-evaluated:Patient Re-evaluated prior to induction Oxygen Delivery Method: Circle system utilized Preoxygenation: Pre-oxygenation with 100% oxygen Induction Type: IV induction LMA: LMA flexible inserted LMA Size: 5.0 Tube type: Oral Number of attempts: 1 Placement Confirmation: positive ETCO2 and breath sounds checked- equal and bilateral Tube secured with: Tape Dental Injury: Teeth and Oropharynx as per pre-operative assessment

## 2022-11-20 ENCOUNTER — Encounter (HOSPITAL_COMMUNITY): Payer: Self-pay | Admitting: Podiatry

## 2022-11-20 ENCOUNTER — Inpatient Hospital Stay (HOSPITAL_COMMUNITY): Payer: Medicaid Other

## 2022-11-20 DIAGNOSIS — Z89421 Acquired absence of other right toe(s): Secondary | ICD-10-CM

## 2022-11-20 DIAGNOSIS — E1169 Type 2 diabetes mellitus with other specified complication: Secondary | ICD-10-CM

## 2022-11-20 DIAGNOSIS — Z89422 Acquired absence of other left toe(s): Secondary | ICD-10-CM

## 2022-11-20 DIAGNOSIS — I70222 Atherosclerosis of native arteries of extremities with rest pain, left leg: Secondary | ICD-10-CM

## 2022-11-20 LAB — BASIC METABOLIC PANEL
Anion gap: 7 (ref 5–15)
Anion gap: 7 (ref 5–15)
BUN: 14 mg/dL (ref 6–20)
BUN: 16 mg/dL (ref 6–20)
CO2: 23 mmol/L (ref 22–32)
CO2: 24 mmol/L (ref 22–32)
Calcium: 9 mg/dL (ref 8.9–10.3)
Calcium: 9.3 mg/dL (ref 8.9–10.3)
Chloride: 103 mmol/L (ref 98–111)
Chloride: 104 mmol/L (ref 98–111)
Creatinine, Ser: 1.13 mg/dL (ref 0.61–1.24)
Creatinine, Ser: 0.98 mg/dL (ref 0.61–1.24)
GFR, Estimated: >60 mL/min (ref >60)
GFR, Estimated: >60 mL/min (ref >60)
Glucose, Bld: 145 mg/dL — ABNORMAL HIGH (ref 70–99)
Glucose, Bld: 149 mg/dL — ABNORMAL HIGH (ref 70–99)
Potassium: 5.4 mmol/L — ABNORMAL HIGH (ref 3.5–5.1)
Potassium: 4.3 mmol/L (ref 3.5–5.1)
Sodium: 133 mmol/L — ABNORMAL LOW (ref 135–145)
Sodium: 135 mmol/L (ref 135–145)

## 2022-11-20 LAB — GLUCOSE, CAPILLARY
Glucose-Capillary: 164 mg/dL — ABNORMAL HIGH (ref 70–99)
Glucose-Capillary: 140 mg/dL — ABNORMAL HIGH (ref 70–99)
Glucose-Capillary: 139 mg/dL — ABNORMAL HIGH (ref 70–99)
Glucose-Capillary: 200 mg/dL — ABNORMAL HIGH (ref 70–99)

## 2022-11-20 LAB — CBC WITH DIFFERENTIAL/PLATELET
Abs Immature Granulocytes: 0.04 10*3/uL (ref 0.00–0.07)
Basophils Absolute: 0 10*3/uL (ref 0.0–0.1)
Basophils Relative: 0 %
Eosinophils Absolute: 0.5 10*3/uL (ref 0.0–0.5)
Eosinophils Relative: 5 %
HCT: 34.5 % — ABNORMAL LOW (ref 39.0–52.0)
Hemoglobin: 11.1 g/dL — ABNORMAL LOW (ref 13.0–17.0)
Immature Granulocytes: 0 %
Lymphocytes Relative: 22 %
Lymphs Abs: 2.3 10*3/uL (ref 0.7–4.0)
MCH: 26.5 pg (ref 26.0–34.0)
MCHC: 32.2 g/dL (ref 30.0–36.0)
MCV: 82.3 fL (ref 80.0–100.0)
Monocytes Absolute: 0.8 10*3/uL (ref 0.1–1.0)
Monocytes Relative: 8 %
Neutro Abs: 6.7 10*3/uL (ref 1.7–7.7)
Neutrophils Relative %: 65 %
Platelets: 210 10*3/uL (ref 150–400)
RBC: 4.19 MIL/uL — ABNORMAL LOW (ref 4.22–5.81)
RDW: 15.8 % — ABNORMAL HIGH (ref 11.5–15.5)
WBC: 10.4 10*3/uL (ref 4.0–10.5)
nRBC: 0 % (ref 0.0–0.2)

## 2022-11-20 MED ORDER — RIVAROXABAN 10 MG PO TABS
10.0000 mg | ORAL_TABLET | Freq: Every day | ORAL | Status: AC
  Administered 2022-11-20: 10 mg via ORAL
  Filled 2022-11-20: qty 1

## 2022-11-20 MED ORDER — SODIUM ZIRCONIUM CYCLOSILICATE 10 G PO PACK
10.0000 g | PACK | Freq: Once | ORAL | Status: AC
  Administered 2022-11-20: 10 g via ORAL
  Filled 2022-11-20: qty 1

## 2022-11-20 NOTE — Progress Notes (Addendum)
PODIATRY PROGRESS NOTE  NAME Carl Conley MRN [id number] DOB [birth year] DOA 11/17/2022   DOS: 11/19/2022 Right foot: Excisional debridement of ulceration to level of bone, 2.5 x 2.5 x 0.4 cm, R ankle Bone biopsy distal fibula, right ankle Amputation of 2nd toe at metatarsal phalangeal joint level, Right foot   Left foot: Partial 2nd ray amputation, left foot 3rd metatarsal head bone biopsy, left foot 1st metatarsal head bone biopsy, left foot  Reason for consult: No chief complaint on file.    History of present illness: 37 y.o. male POD # 1 s/p the above surgeries.  States he is feeling well.  No significant pain.  Denies any fevers or chills.  Vitals:   11/20/22 0818 11/20/22 1224  BP: 100/68 103/69  Pulse: 80 80  Resp: 17 18  Temp: 99.1 F (37.3 C) 98.6 F (37 C)  SpO2: 97% 98%       Latest Ref Rng & Units 11/20/2022    3:54 AM 11/17/2022    8:48 PM 02/04/2020    2:06 PM  CBC  WBC 4.0 - 10.5 K/uL 10.4  10.4  9.3   Hemoglobin 13.0 - 17.0 g/dL 11.1  10.7  16.8   Hematocrit 39.0 - 52.0 % 34.5  33.5  47.6   Platelets 150 - 400 K/uL 210  226  255        Latest Ref Rng & Units 11/20/2022    2:33 PM 11/20/2022    3:54 AM 11/17/2022    8:48 PM  BMP  Glucose 70 - 99 mg/dL 149  145  149   BUN 6 - 20 mg/dL 16  14  10    Creatinine 0.61 - 1.24 mg/dL 0.98  1.13  1.11   Sodium 135 - 145 mmol/L 135  133  135   Potassium 3.5 - 5.1 mmol/L 4.3  5.4  3.9   Chloride 98 - 111 mmol/L 104  103  104   CO2 22 - 32 mmol/L 24  23  21    Calcium 8.9 - 10.3 mg/dL 9.3  9.0  9.0       Physical Exam: General: AAOx3, NAD  Dermatology: On the left foot status post partial second amputation will, biopsies.  Wound was packed and upon removal there was some bloody drainage noted but the wound is granular.  No purulence.  No fluctuance or crepitation.  On the right foot graft intact appears to incorporating without any surrounding erythema, ascending cellulitis.  No fluctuance or  crepitation.  Neuro.  Status post second amputation with sutures intact.  Mild rim of erythema there is no drainage or pus.  No blood pressure crepitation there is no malodor.  Vascular: Feet appear to be warm and well-perfused  Neurological: Sensation decreased  Musculoskeletal Exam: No pain on exam    ASSESSMENT/PLAN OF CARE  POD #1 s/p bilateral foot surgery with the above procedures  Overall seems to be doing well.  Plans to return to the OR on Thursday with Dr. Loel Lofty for left foot transmetatarsal potation.  On the right foot await bone biopsy, culture data.  Appreciate infectious disease input.  Nonweightbearing left lower extremity, weightbearing right foot.  Reviewed ABI which are noncompressible but waveforms triphasic. However given the extent of the infection/amputation may be valuable to consult vascular.     Please contact me directly with any questions or concerns.     Carl Conley, DPM [facility]  Dr. Bonna Gains. Jacqualyn Posey, DPM  [address], [place] [address]                Office [phone]  Fax [phone]

## 2022-11-20 NOTE — Progress Notes (Signed)
   Subjective:  NAEON.  Given Lokelma x 1 for hyperkalemia   The patient was evaluated at the bedside this morning. He has not had any lower extremity pain after his surgery yesterday. Overall feels that he is doing well, but understands that he has to wait for another procedure.   Objective:  Vital signs in last 24 hours: Vitals:   11/20/22 0620 11/20/22 0802 11/20/22 0818 11/20/22 1224  BP: 100/71 104/71 100/68 103/69  Pulse:  83 80 80  Resp:  17 17 18   Temp:  98.8 F (37.1 C) 99.1 F (37.3 C) 98.6 F (37 C)  TempSrc:  Oral Oral Oral  SpO2:  99% 97% 98%  Height:       Physical Exam: General: Alert, not in acute distress Cardiac: RRR, no MRG Respiratory: CTAB, no wheezes, crackles, rales Extremities: Bilateral feet covered in dressings. S/p amputation of R 2nd toe at MTP joint and L partial 2nd ray amputation  Neuro: Alert and oriented x4.  Psych: flat affect    Assessment/Plan:  Principal Problem:   Diabetic osteomyelitis ([place]) Active Problems:   Osteomyelitis of left foot (HCC)  Carl Conley is a 37 year old man with a PMHx significant for T2DM, NAFLD, ADHD, and Depression who presents for bilateral foot wounds and admitted for management of bilateral osteomyelitis.    Bilateral Feet Osteomyelitis 2/2 to diabetic neuropathy Status post bilateral partial lower extremity amputations Presented w/ worsening bilateral foot wounds over the last 6 months. Previously seen in clinic, started on augmentin/doxycycline w/o resolution. Feet XRAY from 1/11 demonstrate bony destruction of the second toes bilaterally, and bony irregularity and periosteal reaction of the left metatarsal shaft consistent with osteomyelitis. CRP elevated at 1.1. ESR elevated at 49. MRI of L/R foot on admission with osteo.  The patient is now POD #1 following amputation of R 2nd toe at MTP joint, R distal fibula biopsy, debridement of R ankle, L partial 2nd ray amputation, and 1st and 3rd metatarsal head  bone biopsies. He will need to return to the OR on Thursday for left transmetatarsal amputation.  ID consulted, will continue w/ IV antibiotics and hopefully can transition to PO abx, pending culture results. The patient will need 4-6 weeks of antibiotics.  - F/u Podiatry, ID recs - Surgical cultures still in process  - IV Vancomycin per pharmacy - IV Cefepime per pharmacy - Trend CBC - Scheduled Tylenol 1000 3 times daily, oxycodone 10 mg prn every 8 for severe pain - PT/OT after follow-up surgery   Hyperkalemia K 5.4, Lokelma x 1 given this morning. - Repeat BMP at 1400 today  T2DM A1c 6.5% from 10/2022. CBGs WNL since admission. On Invokamet 150-1000mg  BID at home.  -Hold home Invokamet -SSI  -Trend CBGs   Hx of depression Chronic condition. Takes citalopram at home.  -Continue home citalopram 20mg    Hx of HSV keratitis Diagnosed in 2017. Seen by Dr. Katy Fitch, ophthalmology. Takes acyclovir at home.  -Continue home acyclovir 400mg  BID   DVT Prophylaxis: Xarelto 10 mg - will hold on 1/17 in anticipation of surgery Diet: Carb modified Bowel: miralax PRN IVF: none Code: Full   Prior to Admission Living Arrangement: home w/ mother Anticipated Discharge Location: TBD Barriers to Discharge: continued management Dispo: Anticipated discharge in approximately more than 2 day(s).   Dorethea Clan, DO 11/20/2022, 2:07 PM Pager: [phone] After 5pm on weekdays and 1pm on weekends: On Call pager [phone]

## 2022-11-20 NOTE — Plan of Care (Signed)
  Problem: Education: Goal: Knowledge of General Education information will improve Description: Including pain rating scale, medication(s)/side effects and non-pharmacologic comfort measures Outcome: Progressing   Problem: Health Behavior/Discharge Planning: Goal: Ability to manage health-related needs will improve Outcome: Progressing   Problem: Clinical Measurements: Goal: Ability to maintain clinical measurements within normal limits will improve Outcome: Progressing Goal: Will remain free from infection Outcome: Progressing Goal: Diagnostic test results will improve Outcome: Progressing Goal: Respiratory complications will improve Outcome: Progressing Goal: Cardiovascular complication will be avoided Outcome: Progressing   Problem: Activity: Goal: Risk for activity intolerance will decrease Outcome: Progressing   Problem: Nutrition: Goal: Adequate nutrition will be maintained Outcome: Progressing   Problem: Coping: Goal: Level of anxiety will decrease Outcome: Progressing   Problem: Elimination: Goal: Will not experience complications related to bowel motility Outcome: Progressing Goal: Will not experience complications related to urinary retention Outcome: Progressing   Problem: Pain Managment: Goal: General experience of comfort will improve Outcome: Progressing   Problem: Safety: Goal: Ability to remain free from injury will improve Outcome: Progressing   Problem: Skin Integrity: Goal: Risk for impaired skin integrity will decrease Outcome: Progressing   Problem: Education: Goal: Knowledge of General Education information will improve Description: Including pain rating scale, medication(s)/side effects and non-pharmacologic comfort measures Outcome: Progressing   Problem: Health Behavior/Discharge Planning: Goal: Ability to manage health-related needs will improve Outcome: Progressing   Problem: Clinical Measurements: Goal: Ability to maintain  clinical measurements within normal limits will improve Outcome: Progressing Goal: Will remain free from infection Outcome: Progressing Goal: Diagnostic test results will improve Outcome: Progressing Goal: Respiratory complications will improve Outcome: Progressing Goal: Cardiovascular complication will be avoided Outcome: Progressing   Problem: Activity: Goal: Risk for activity intolerance will decrease Outcome: Progressing   Problem: Nutrition: Goal: Adequate nutrition will be maintained Outcome: Progressing   Problem: Coping: Goal: Level of anxiety will decrease Outcome: Progressing   Problem: Elimination: Goal: Will not experience complications related to bowel motility Outcome: Progressing Goal: Will not experience complications related to urinary retention Outcome: Progressing   Problem: Pain Managment: Goal: General experience of comfort will improve Outcome: Progressing   Problem: Safety: Goal: Ability to remain free from injury will improve Outcome: Progressing   Problem: Skin Integrity: Goal: Risk for impaired skin integrity will decrease Outcome: Progressing   Problem: Education: Goal: Ability to describe self-care measures that may prevent or decrease complications (Diabetes Survival Skills Education) will improve Outcome: Progressing Goal: Individualized Educational Video(s) Outcome: Progressing   Problem: Coping: Goal: Ability to adjust to condition or change in health will improve Outcome: Progressing   Problem: Fluid Volume: Goal: Ability to maintain a balanced intake and output will improve Outcome: Progressing   Problem: Health Behavior/Discharge Planning: Goal: Ability to identify and utilize available resources and services will improve Outcome: Progressing Goal: Ability to manage health-related needs will improve Outcome: Progressing   Problem: Metabolic: Goal: Ability to maintain appropriate glucose levels will improve Outcome:  Progressing   Problem: Nutritional: Goal: Maintenance of adequate nutrition will improve Outcome: Progressing Goal: Progress toward achieving an optimal weight will improve Outcome: Progressing   Problem: Skin Integrity: Goal: Risk for impaired skin integrity will decrease Outcome: Progressing   Problem: Tissue Perfusion: Goal: Adequacy of tissue perfusion will improve Outcome: Progressing   

## 2022-11-20 NOTE — Anesthesia Postprocedure Evaluation (Signed)
Anesthesia Post Note  Patient: Carl Conley  Procedure(Conley) Performed: RIGHT FOOT SECOND TOE AMPUTATION, ANKLE WOUND DEBRIDEMENT AND BONE BIOPSY (Right: Foot) LEFT FOOT PARTIAL SECOND RAY AMPUTATION, LEFT THIRD METATARSAL BONE BIOPSY, LEFT FIRST METATARSAL BONE BIOPSY (Left: Foot)     Patient location during evaluation: PACU Anesthesia Type: General Level of consciousness: awake and alert Pain management: pain level controlled Vital Signs Assessment: post-procedure vital signs reviewed and stable Respiratory status: spontaneous breathing, nonlabored ventilation, respiratory function stable and patient connected to nasal cannula oxygen Cardiovascular status: blood pressure returned to baseline and stable Postop Assessment: no apparent nausea or vomiting Anesthetic complications: no  No notable events documented.  Last Vitals:  Vitals:   11/20/22 0818 11/20/22 1224  BP: 100/68 103/69  Pulse: 80 80  Resp: 17 18  Temp: 37.3 C 37 C  SpO2: 97% 98%    Last Pain:  Vitals:   11/20/22 1224  TempSrc: Oral  PainSc:                  Carl Conley

## 2022-11-20 NOTE — Progress Notes (Signed)
ABI has been completed.   Preliminary results in CV Proc.   Carl Conley 11/20/2022 3:55 PM

## 2022-11-21 DIAGNOSIS — M86171 Other acute osteomyelitis, right ankle and foot: Secondary | ICD-10-CM

## 2022-11-21 DIAGNOSIS — M86172 Other acute osteomyelitis, left ankle and foot: Secondary | ICD-10-CM

## 2022-11-21 LAB — BASIC METABOLIC PANEL
Anion gap: 9 (ref 5–15)
BUN: 19 mg/dL (ref 6–20)
CO2: 21 mmol/L — ABNORMAL LOW (ref 22–32)
Calcium: 9.3 mg/dL (ref 8.9–10.3)
Chloride: 104 mmol/L (ref 98–111)
Creatinine, Ser: 0.98 mg/dL (ref 0.61–1.24)
GFR, Estimated: >60 mL/min (ref >60)
Glucose, Bld: 155 mg/dL — ABNORMAL HIGH (ref 70–99)
Potassium: 4.2 mmol/L (ref 3.5–5.1)
Sodium: 134 mmol/L — ABNORMAL LOW (ref 135–145)

## 2022-11-21 LAB — GLUCOSE, CAPILLARY
Glucose-Capillary: 130 mg/dL — ABNORMAL HIGH (ref 70–99)
Glucose-Capillary: 109 mg/dL — ABNORMAL HIGH (ref 70–99)
Glucose-Capillary: 131 mg/dL — ABNORMAL HIGH (ref 70–99)
Glucose-Capillary: 210 mg/dL — ABNORMAL HIGH (ref 70–99)

## 2022-11-21 LAB — CBC
HCT: 35.3 % — ABNORMAL LOW (ref 39.0–52.0)
Hemoglobin: 11.4 g/dL — ABNORMAL LOW (ref 13.0–17.0)
MCH: 26.5 pg (ref 26.0–34.0)
MCHC: 32.3 g/dL (ref 30.0–36.0)
MCV: 81.9 fL (ref 80.0–100.0)
Platelets: 219 10*3/uL (ref 150–400)
RBC: 4.31 MIL/uL (ref 4.22–5.81)
RDW: 15.9 % — ABNORMAL HIGH (ref 11.5–15.5)
WBC: 11 10*3/uL — ABNORMAL HIGH (ref 4.0–10.5)
nRBC: 0 % (ref 0.0–0.2)

## 2022-11-21 LAB — VAS US ABI WITH/WO TBI
Left ABI: 1.41
Right ABI: 1.32

## 2022-11-21 LAB — VANCOMYCIN, PEAK: Vancomycin Pk: 52 ug/mL (ref 30–40)

## 2022-11-21 LAB — VANCOMYCIN, TROUGH: Vancomycin Tr: 29 ug/mL (ref 15–20)

## 2022-11-21 LAB — SURGICAL PATHOLOGY

## 2022-11-21 MED ORDER — VANCOMYCIN HCL 750 MG/150ML IV SOLN
750.0000 mg | Freq: Two times a day (BID) | INTRAVENOUS | Status: DC
  Administered 2022-11-21 (×2): 750 mg via INTRAVENOUS
  Filled 2022-11-21 (×3): qty 150

## 2022-11-21 NOTE — Progress Notes (Signed)
Pharmacy Antibiotic Note  Carl Conley is a 37 y.o. male admitted on 11/17/2022 with  bilateral foot wounds since July 2023, found to have osteomyelitis- s/pR foot second toe amputation, ankle wound debridement and Left foot partial second ray amputation . Planning to return to the OR 1/18 for transmetatarsal amputation. OR cultures (bone) growing staphylococcus aureus and Pseudomonas aeruginosa- pending sensitivities. Pharmacy has been consulted for Vancomycin and Cefepime dosing.  Vancomycin dose : 1,250 mg IV Q12H- dose given 11/20/22 2205  Vancomycin peak (drawn ~0.5 hours early): 52- drawn 11/20/22 2356  Vancomycin trough: 29- drawn 11/21/22 0447  Based on 2-level kinetics (using slightly reduced peak of 48 due to early draw) Vancomycin dose 1,250 mg IV Q12H is supratherapeutic.    Plan: Decrease vancomycin dose to Vancomycin 750 mg Q12H (eAUC 400-450)  Cefepime 2gm IV Q8H  F/U surgery, ID recs, C/S, levels PRN   Height: 5\' 11"  (180.3 cm) IBW/kg (Calculated) : 75.3  Temp (24hrs), Avg:98.4 F (36.9 C), Min:98 F (36.7 C), Max:98.6 F (37 C)  Recent Labs  Lab 11/17/22 2048 11/20/22 0354 11/20/22 1433 11/20/22 2356 11/21/22 0447  WBC 10.4 10.4  --   --  11.0*  CREATININE 1.11 1.13 0.98  --  0.98  VANCOTROUGH  --   --   --   --  29*  VANCOPEAK  --   --   --  52*  --      Estimated Creatinine Clearance: 125.7 mL/min (by C-G formula based on SCr of 0.98 mg/dL).    No Known Allergies  Antimicrobials this admission: Vanc 1/13 >>  Cefepime  1/13 >>   Dose adjustments this admission: Vancomycin 1,250 mg Q12H>>750 mg  Q12H on 11/21/22   Microbiology results: 1/15 R fibula bone: PsA  1/15 First MPJ left foot wound: PsA 1/15 left metatarsal bone: rare SA   1/15 MPJ right: no orgs   Adria Dill, PharmD PGY-2 Infectious Diseases Resident  11/21/2022 10:59 AM

## 2022-11-21 NOTE — Plan of Care (Signed)
  Problem: Education: Goal: Knowledge of General Education information will improve Description: Including pain rating scale, medication(s)/side effects and non-pharmacologic comfort measures Outcome: Progressing   Problem: Health Behavior/Discharge Planning: Goal: Ability to manage health-related needs will improve Outcome: Progressing   Problem: Clinical Measurements: Goal: Ability to maintain clinical measurements within normal limits will improve Outcome: Progressing Goal: Will remain free from infection Outcome: Progressing Goal: Diagnostic test results will improve Outcome: Progressing Goal: Respiratory complications will improve Outcome: Progressing Goal: Cardiovascular complication will be avoided Outcome: Progressing   Problem: Activity: Goal: Risk for activity intolerance will decrease Outcome: Progressing   Problem: Nutrition: Goal: Adequate nutrition will be maintained Outcome: Progressing   Problem: Coping: Goal: Level of anxiety will decrease Outcome: Progressing   Problem: Elimination: Goal: Will not experience complications related to bowel motility Outcome: Progressing Goal: Will not experience complications related to urinary retention Outcome: Progressing   Problem: Pain Managment: Goal: General experience of comfort will improve Outcome: Progressing   Problem: Safety: Goal: Ability to remain free from injury will improve Outcome: Progressing   Problem: Skin Integrity: Goal: Risk for impaired skin integrity will decrease Outcome: Progressing   Problem: Education: Goal: Knowledge of General Education information will improve Description: Including pain rating scale, medication(s)/side effects and non-pharmacologic comfort measures Outcome: Progressing   Problem: Health Behavior/Discharge Planning: Goal: Ability to manage health-related needs will improve Outcome: Progressing   Problem: Clinical Measurements: Goal: Ability to maintain  clinical measurements within normal limits will improve Outcome: Progressing Goal: Will remain free from infection Outcome: Progressing Goal: Diagnostic test results will improve Outcome: Progressing Goal: Respiratory complications will improve Outcome: Progressing Goal: Cardiovascular complication will be avoided Outcome: Progressing   Problem: Activity: Goal: Risk for activity intolerance will decrease Outcome: Progressing   Problem: Nutrition: Goal: Adequate nutrition will be maintained Outcome: Progressing   Problem: Coping: Goal: Level of anxiety will decrease Outcome: Progressing   Problem: Elimination: Goal: Will not experience complications related to bowel motility Outcome: Progressing Goal: Will not experience complications related to urinary retention Outcome: Progressing   Problem: Pain Managment: Goal: General experience of comfort will improve Outcome: Progressing   Problem: Safety: Goal: Ability to remain free from injury will improve Outcome: Progressing   Problem: Skin Integrity: Goal: Risk for impaired skin integrity will decrease Outcome: Progressing   Problem: Education: Goal: Ability to describe self-care measures that may prevent or decrease complications (Diabetes Survival Skills Education) will improve Outcome: Progressing Goal: Individualized Educational Video(s) Outcome: Progressing   Problem: Coping: Goal: Ability to adjust to condition or change in health will improve Outcome: Progressing   Problem: Fluid Volume: Goal: Ability to maintain a balanced intake and output will improve Outcome: Progressing   Problem: Health Behavior/Discharge Planning: Goal: Ability to identify and utilize available resources and services will improve Outcome: Progressing Goal: Ability to manage health-related needs will improve Outcome: Progressing   Problem: Metabolic: Goal: Ability to maintain appropriate glucose levels will improve Outcome:  Progressing   Problem: Nutritional: Goal: Maintenance of adequate nutrition will improve Outcome: Progressing Goal: Progress toward achieving an optimal weight will improve Outcome: Progressing   Problem: Skin Integrity: Goal: Risk for impaired skin integrity will decrease Outcome: Progressing   Problem: Tissue Perfusion: Goal: Adequacy of tissue perfusion will improve Outcome: Progressing   

## 2022-11-21 NOTE — Progress Notes (Addendum)
Date and time results received: 11/21/22 12:45 AM    Test: Vancomycin Critical Value: 103  Name of Provider Notified: Dr Posey Pronto MD  Orders Received? Or Actions Taken?: Provider will speak to pharmacy about the results.

## 2022-11-21 NOTE — Progress Notes (Signed)
Subjective:  NAEON.  No prn pain meds used. The patient denies any pain in his lower extremities. He has been eating and drinking well, without any nausea or vomiting.   Objective:  Vital signs in last 24 hours: Vitals:   11/20/22 0818 11/20/22 1224 11/20/22 2007 11/21/22 0424  BP: 100/68 103/69 105/66 118/78  Pulse: 80 80 85 80  Resp: 17 18 18 18   Temp: 99.1 F (37.3 C) 98.6 F (37 C) 98.6 F (37 C) 98 F (36.7 C)  TempSrc: Oral Oral Oral   SpO2: 97% 98% 97% 100%  Height:       Physical Exam: General: Awake and alert, not in acute distress Cardiac: RRR, no MRG Respiratory: CTAB, normal work of breathing on room air Extremities: Bilateral feet covered in dressings. S/p amputation of R 2nd toe at MTP joint and L partial 2nd ray amputation  Neuro: Alert and oriented x4.  Psych: flat affect    Assessment/Plan:  Principal Problem:   Diabetic osteomyelitis ([place]) Active Problems:   Osteomyelitis of left foot (HCC)  Carl Conley is a 37 year old man with a PMHx significant for T2DM, NAFLD, ADHD, and Depression who presents for bilateral foot wounds and admitted for management of bilateral osteomyelitis.    Bilateral Feet Osteomyelitis 2/2 to diabetic neuropathy Status post bilateral partial lower extremity amputations Presented w/ worsening bilateral foot wounds over the last 6 months. Previously seen in clinic, started on augmentin/doxycycline w/o resolution. Feet XRAY from 1/11 demonstrate bony destruction of the second toes bilaterally, and bony irregularity and periosteal reaction of the left metatarsal shaft consistent with osteomyelitis. CRP elevated at 1.1. ESR elevated at 49. MRI of L/R foot on admission with osteo.  The patient is now POD #2 following amputation of R 2nd toe at MTP joint, R distal fibula biopsy, debridement of R ankle, L partial 2nd ray amputation, and 1st and 3rd metatarsal head bone biopsies. He will need to return to the OR on Thursday, 1/18, for  left transmetatarsal amputation.  ID consulted, will continue w/ IV antibiotics and hopefully can transition to PO abx, pending culture results. Cultures thus far are growing staph aureus and pseudomonas. The patient will need 4-6 weeks of antibiotics. Mild leukocytosis today but afebrile and hemodynamically stable. - F/u Podiatry, ID recs - NPO at midnight for left TMA tomorrow  - Vascular surgery consulted today, plan for f/u ABI in 1 year  - Surgical cultures preliminarily growing rare staph aureus, pseudamonas - IV Vancomycin per pharmacy - IV Cefepime per pharmacy - Trend CBC - Scheduled Tylenol 1000 3 times daily, oxycodone 10 mg prn every 8 for severe pain - PT/OT after follow-up surgery   Hyperkalemia (resolved) Mild hyponatremia Na 134 this AM, will CTM. - Daily BMP  T2DM A1c 6.5% from 10/2022.  On Invokamet 150-1000mg  BID at home. CBGs largely well managed postop -Hold home Invokamet -SSI  -Trend CBGs, maintain < 180 postop   Hx of depression Chronic condition. Takes citalopram at home.  -Continue home citalopram 20mg    Hx of HSV keratitis Diagnosed in 2017. Seen by Dr. Katy Fitch, ophthalmology. Takes acyclovir at home.  -Continue home acyclovir 400mg  BID   DVT Prophylaxis: Held today  Diet: Carb modified, NPO at midnight  Bowel: miralax PRN IVF: none Code: Full   Prior to Admission Living Arrangement: home w/ mother Anticipated Discharge Location: TBD Barriers to Discharge: continued management Dispo: Anticipated discharge in approximately more than 2 day(s).   Dorethea Clan, DO 11/21/2022,  2:49 PM Pager: [phone] After 5pm on weekdays and 1pm on weekends: On Call pager [phone]

## 2022-11-21 NOTE — Consult Note (Addendum)
[facility] CONSULT NOTE   MRN : [id number]  Reason for Consult: non compressible ABI's reported Referring Physician: DR. Marjie Conley IM   History of Present Illness: 37 y/o male presents with B foot wounds 11/17/21.  He states the ulcers started in July due to running outside barefoot.  The right second toe and lateral malleolus wounds, the left second toe , 3erd toe and plantar 2/3 MTH wound.  He has been diagnosed with osteomyelitis B feet with MRI .       He was seen by [facility] Dr. Annamary Rummage with reports of palpable pedal pulses B.  He is now S/P right second toe amputation, debridement and bone biopsy of the later malleolus wound right  LE.  Left foot: Partial 2nd ray amputation, left foot 3rd metatarsal head bone biopsy, left foot 1st metatarsal head bone biopsy, left foot   We have been ask to consult on this patient due to abnormal ABI's.    PMH: DM, depression and ADHD.       Current Facility-Administered Medications  Medication Dose Route Frequency Provider Last Rate Last Admin   acetaminophen (TYLENOL) tablet 1,000 mg  1,000 mg Oral TID Carl Herrlich, MD   1,000 mg at 11/20/22 1819   Or   acetaminophen (TYLENOL) suppository 650 mg  650 mg Rectal TID Carl Herrlich, MD       acyclovir (ZOVIRAX) 200 MG capsule 400 mg  400 mg Oral BID Conley, [name], MD   400 mg at 11/21/22 9518   ceFEPIme (MAXIPIME) 2 g in sodium chloride 0.9 % 100 mL IVPB  2 g Intravenous Q8H Carl Conley, RPH 200 mL/hr at 11/21/22 0520 2 g at 11/21/22 0520   citalopram (CELEXA) tablet 20 mg  20 mg Oral Daily Carl Bene, MD   20 mg at 11/20/22 1820   insulin aspart (novoLOG) injection 0-15 Units  0-15 Units Subcutaneous TID WC Conley, [name], MD   2 Units at 11/21/22 8416   insulin aspart (novoLOG) injection 0-5 Units  0-5 Units Subcutaneous QHS Conley, [name], MD       oxyCODONE (Oxy IR/ROXICODONE) immediate release tablet 10 mg  10 mg Oral Q6H PRN  Carl Herrlich, MD       polyethylene glycol (MIRALAX / GLYCOLAX) packet 17 g  17 g Oral Daily PRN Carl Bene, MD       vancomycin (VANCOREADY) IVPB 750 mg/150 mL  750 mg Intravenous Q12H Conley, Carl A, RPH 150 mL/hr at 11/21/22 0924 750 mg at 11/21/22 0924    Pt meds include: Statin :No Betablocker: No ASA: No Other anticoagulants/antiplatelets: none  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   Depression 10/31/2016   Diabetes mellitus (HCC)    dx date 2004 at age 22   Low back pain 09/01/2016   Visual snow syndrome     Past Surgical History:  Procedure Laterality Date   AMPUTATION Right 11/19/2022   Procedure: RIGHT FOOT SECOND TOE AMPUTATION, ANKLE WOUND DEBRIDEMENT AND BONE BIOPSY;  Surgeon: Carl Conley, DPM;  Location: [facility] OR;  Service: Podiatry;  Laterality: Right;   AMPUTATION Left 11/19/2022   Procedure: LEFT FOOT PARTIAL SECOND RAY AMPUTATION, LEFT THIRD METATARSAL BONE BIOPSY, LEFT FIRST METATARSAL BONE BIOPSY;  Surgeon: Carl Conley, DPM;  Location: [facility] OR;  Service: Podiatry;  Laterality: Left;   WISDOM TOOTH EXTRACTION      Social History Social History   Tobacco  Use   Smoking status: Never   Smokeless tobacco: Never  Vaping Use   Vaping Use: Never used  Substance Use Topics   Alcohol use: No   Drug use: No    Family History Family History  Problem Relation Age of Onset   Depression Mother     No Known Allergies   REVIEW OF SYSTEMS  General: [ ]  Weight loss, [ ]  Fever, [ ]  chills Neurologic: [ ]  Dizziness, [ ]  Blackouts, [ ]  Seizure [ ]  Stroke, [ ]  "Mini stroke", [ ]  Slurred speech, [ ]  Temporary blindness; [ ]  weakness in arms or legs, [ ]  Hoarseness [ ]  Dysphagia Cardiac: [ ]  Chest pain/pressure, [ ]  Shortness of breath at rest [ ]  Shortness of breath with exertion, [ ]  Atrial fibrillation or irregular heartbeat  Vascular: [ ]  Pain in legs with walking, [ ]  Pain in  legs at rest, [ ]  Pain in legs at night,  [ x] Non-healing ulcer, [ ]  Blood clot in vein/DVT,   Pulmonary: [ ]  Home oxygen, [ ]  Productive cough, [ ]  Coughing up blood, [ ]  Asthma,  [ ]  Wheezing [ ]  COPD Musculoskeletal:  [ ]  Arthritis, [ ]  Low back pain, [ ]  Joint pain Hematologic: [ ]  Easy Bruising, [ ]  Anemia; [ ]  Hepatitis Gastrointestinal: [ ]  Blood in stool, [ ]  Gastroesophageal Reflux/heartburn, Urinary: [ ]  chronic Kidney disease, [ ]  on HD - [ ]  MWF or [ ]  TTHS, [ ]  Burning with urination, [ ]  Difficulty urinating Skin: [ ]  Rashes, [ x] Wounds Psychological: [ ]  Anxiety, [x ] Depression  Physical Examination Vitals:   11/20/22 0818 11/20/22 1224 11/20/22 2007 11/21/22 0424  BP: 100/68 103/69 105/66 118/78  Pulse: 80 80 85 80  Resp: 17 18 18 18   Temp: 99.1 F (37.3 C) 98.6 F (37 C) 98.6 F (37 C) 98 F (36.7 C)  TempSrc: Oral Oral Oral   SpO2: 97% 98% 97% 100%  Height:       Body mass index is 30.8 kg/m.  General:  WDWN in NAD HENT: WNL Eyes: Pupils equal Pulmonary: normal non-labored breathing , without Rales, rhonchi,  wheezing Cardiac: RRR, without  Murmurs, rubs or gallops; No carotid bruits Abdomen: soft, NT, no masses Skin:  ulcers noted;  no Gangrene , no cellulitis; no open wounds;  B foot dressings clean and dry  Vascular Exam/Pulses: radial pulses, femoral pulses and PT B LE pulses Palpable   Musculoskeletal: no muscle wasting or atrophy; no edema  Neurologic: A&O X 3; Appropriate Affect ;  SENSATION: normal; MOTOR FUNCTION: 5/5 Symmetric Speech is fluent/normal   Significant Diagnostic Studies: CBC Lab Results  Component Value Date   WBC 11.0 (H) 11/21/2022   HGB 11.4 (L) 11/21/2022   HCT 35.3 (L) 11/21/2022   MCV 81.9 11/21/2022   PLT 219 11/21/2022    BMET    Component Value Date/Time   NA 134 (L) 11/21/2022 0447   NA 138 07/17/2021 1408   K 4.2 11/21/2022 0447   CL 104 11/21/2022 0447   CO2 21 (L) 11/21/2022 0447   GLUCOSE 155  (H) 11/21/2022 0447   BUN 19 11/21/2022 0447   BUN 16 07/17/2021 1408   CREATININE 0.98 11/21/2022 0447   CREATININE 0.78 02/12/2012 2053   CALCIUM 9.3 11/21/2022 0447   GFRNONAA >60 11/21/2022 0447   GFRAA 112 02/04/2020 1406   Estimated Creatinine Clearance: 125.7 mL/min (by C-G formula based on SCr of 0.98 mg/dL).  COAG  No results found for: "INR", "PROTIME"   Non-Invasive Vascular Imaging:       ABI Findings:  +---------+------------------+-----+---------+-----------------------------  ----+  Right   Rt Pressure (mmHg)IndexWaveform Comment                             +---------+------------------+-----+---------+-----------------------------  ----+  Brachial 102                    triphasic                                    +---------+------------------+-----+---------+-----------------------------  ----+  PTA     136               1.32 triphasic                                    +---------+------------------+-----+---------+-----------------------------  ----+  DP      113               1.10 triphasic                                    +---------+------------------+-----+---------+-----------------------------  ----+  Great Toe                                unable to obtain great toe                                                   pressure due to bandages            +---------+------------------+-----+---------+-----------------------------  ----+   +---------+------------------+-----+---------+-----------------------------  ----+  Left    Lt Pressure (mmHg)IndexWaveform Comment                             +---------+------------------+-----+---------+-----------------------------  ----+  Brachial 103                    triphasic                                    +---------+------------------+-----+---------+-----------------------------  ----+  PTA     140               1.36 triphasic                                     +---------+------------------+-----+---------+-----------------------------  ----+  DP      145               1.41 triphasic                                    +---------+------------------+-----+---------+-----------------------------  ----+  Great Toe  unable to obtain great toe                                                   pressure due to bandages            +---------+------------------+-----+---------+-----------------------------  ----+   +-------+-----------+-----------+------------+------------+  ABI/TBIToday's ABIToday's TBIPrevious ABIPrevious TBI  +-------+-----------+-----------+------------+------------+  Right 1.32                                            +-------+-----------+-----------+------------+------------+  Left  1.41                                            +-------+-----------+-----------+------------+------------+         Summary:  Right: Resting right ankle-brachial index indicates noncompressible right  lower extremity arteries.   Left: Resting left ankle-brachial index indicates noncompressible left  lower extremity arteries.   ASSESSMENT/PLAN:   B chronic diabetic foot wounds with palpable pedal pulses  Acute osteomyelitis B feet s/p S/P   Right foot: second toe amputation, debridement and bone biopsy of the later malleolus wound right  LE.  Left foot: Partial 2nd ray amputation, left foot 3rd metatarsal head bone biopsy, left foot 1st metatarsal head bone biopsy, left foot  Conley reviewed the ABI which demonstrates normal Triphasic arterial blood flow B LE.  The index is falsely elevated which demonstrates calcified arteries.    He has good inflow and should be able to heal the surgical wounds.    Conley will schedule a f/u ABI in 1 year at our office and Dr. Soundra Pilon group can have him f/u as needed during the healing process if there are issue  that point to vascular provoked non healing of the incisions.   Maximum medical care to control DM, exercise and eat a healthy diet.   Roxy Horseman 11/21/2022 9:45 AM   Conley agree with the above.  Conley have seen and evaluated patient.  This is a 37 year old gentleman with type 2 diabetes and family history of peripheral vascular disease who has bilateral lower extremity wounds which are being managed by podiatry.  ABIs were obtained that showed noncompressible vessels.  Toe pressures were not obtained due to dressings.  He has palpable pedal pulses.  Conley suspect that he has adequate blood flow for wound healing.  Conley will try and speak with podiatry tomorrow after the operation to determine whether or not he had adequate blood flow at the time of amputation.  If there is any concerns about adequate perfusion, we could proceed with angiography to fully define his vascular anatomy.  Annamarie Major

## 2022-11-22 ENCOUNTER — Inpatient Hospital Stay (HOSPITAL_COMMUNITY): Payer: Medicaid Other | Admitting: Anesthesiology

## 2022-11-22 ENCOUNTER — Other Ambulatory Visit: Payer: Self-pay

## 2022-11-22 ENCOUNTER — Encounter (HOSPITAL_COMMUNITY)
Disposition: A | Discharge status: Home Health | Payer: Self-pay | Source: Ambulatory Visit | Attending: Internal Medicine

## 2022-11-22 ENCOUNTER — Encounter (HOSPITAL_COMMUNITY): Payer: Self-pay | Admitting: Internal Medicine

## 2022-11-22 ENCOUNTER — Inpatient Hospital Stay (HOSPITAL_COMMUNITY): Payer: Medicaid Other

## 2022-11-22 DIAGNOSIS — D649 Anemia, unspecified: Secondary | ICD-10-CM

## 2022-11-22 DIAGNOSIS — Z794 Long term (current) use of insulin: Secondary | ICD-10-CM

## 2022-11-22 DIAGNOSIS — M869 Osteomyelitis, unspecified: Secondary | ICD-10-CM

## 2022-11-22 DIAGNOSIS — M86172 Other acute osteomyelitis, left ankle and foot: Secondary | ICD-10-CM

## 2022-11-22 DIAGNOSIS — E1169 Type 2 diabetes mellitus with other specified complication: Secondary | ICD-10-CM

## 2022-11-22 HISTORY — PX: TRANSMETATARSAL AMPUTATION: SHX6197

## 2022-11-22 LAB — GLUCOSE, CAPILLARY
Glucose-Capillary: 136 mg/dL — ABNORMAL HIGH (ref 70–99)
Glucose-Capillary: 118 mg/dL — ABNORMAL HIGH (ref 70–99)
Glucose-Capillary: 101 mg/dL — ABNORMAL HIGH (ref 70–99)
Glucose-Capillary: 96 mg/dL (ref 70–99)
Glucose-Capillary: 172 mg/dL — ABNORMAL HIGH (ref 70–99)

## 2022-11-22 LAB — CBC
HCT: 33.9 % — ABNORMAL LOW (ref 39.0–52.0)
Hemoglobin: 10.6 g/dL — ABNORMAL LOW (ref 13.0–17.0)
MCH: 25.9 pg — ABNORMAL LOW (ref 26.0–34.0)
MCHC: 31.3 g/dL (ref 30.0–36.0)
MCV: 82.7 fL (ref 80.0–100.0)
Platelets: 207 10*3/uL (ref 150–400)
RBC: 4.1 MIL/uL — ABNORMAL LOW (ref 4.22–5.81)
RDW: 15.9 % — ABNORMAL HIGH (ref 11.5–15.5)
WBC: 8.5 10*3/uL (ref 4.0–10.5)
nRBC: 0 % (ref 0.0–0.2)

## 2022-11-22 LAB — BASIC METABOLIC PANEL
Anion gap: 10 (ref 5–15)
BUN: 19 mg/dL (ref 6–20)
CO2: 21 mmol/L — ABNORMAL LOW (ref 22–32)
Calcium: 9.1 mg/dL (ref 8.9–10.3)
Chloride: 104 mmol/L (ref 98–111)
Creatinine, Ser: 0.96 mg/dL (ref 0.61–1.24)
GFR, Estimated: >60 mL/min (ref >60)
Glucose, Bld: 156 mg/dL — ABNORMAL HIGH (ref 70–99)
Potassium: 3.9 mmol/L (ref 3.5–5.1)
Sodium: 135 mmol/L (ref 135–145)

## 2022-11-22 SURGERY — AMPUTATION, FOOT, TRANSMETATARSAL
Anesthesia: General | Site: Toe | Laterality: Left

## 2022-11-22 MED ORDER — PROMETHAZINE HCL 25 MG/ML IJ SOLN: 6.2500 mg | INTRAMUSCULAR | Status: DC | PRN

## 2022-11-22 MED ORDER — LIDOCAINE HCL 2 % IJ SOLN
INTRAMUSCULAR | Status: AC
  Filled 2022-11-22: qty 20

## 2022-11-22 MED ORDER — FENTANYL CITRATE (PF) 100 MCG/2ML IJ SOLN: 25.0000 ug | INTRAMUSCULAR | Status: DC | PRN

## 2022-11-22 MED ORDER — BUPIVACAINE HCL (PF) 0.5 % IJ SOLN
INTRAMUSCULAR | Status: AC
  Filled 2022-11-22: qty 10

## 2022-11-22 MED ORDER — ORAL CARE MOUTH RINSE: 15.0000 mL | Freq: Once | OROMUCOSAL | Status: AC

## 2022-11-22 MED ORDER — ONDANSETRON HCL 4 MG/2ML IJ SOLN
INTRAMUSCULAR | Status: AC
  Filled 2022-11-22: qty 2

## 2022-11-22 MED ORDER — PHENYLEPHRINE 80 MCG/ML (10ML) SYRINGE FOR IV PUSH (FOR BLOOD PRESSURE SUPPORT)
PREFILLED_SYRINGE | INTRAVENOUS | Status: DC | PRN
  Administered 2022-11-22 (×2): 80 ug via INTRAVENOUS
  Administered 2022-11-22: 160 ug via INTRAVENOUS
  Administered 2022-11-22 (×3): 80 ug via INTRAVENOUS

## 2022-11-22 MED ORDER — ONDANSETRON HCL 4 MG/2ML IJ SOLN
INTRAMUSCULAR | Status: DC | PRN
  Administered 2022-11-22: 4 mg via INTRAVENOUS

## 2022-11-22 MED ORDER — AMISULPRIDE (ANTIEMETIC) 5 MG/2ML IV SOLN: 10.0000 mg | Freq: Once | INTRAVENOUS | Status: DC | PRN

## 2022-11-22 MED ORDER — 0.9 % SODIUM CHLORIDE (POUR BTL) OPTIME
TOPICAL | Status: DC | PRN
  Administered 2022-11-22: 1000 mL

## 2022-11-22 MED ORDER — INSULIN ASPART 100 UNIT/ML IJ SOLN: 0.0000 [IU] | INTRAMUSCULAR | Status: DC | PRN

## 2022-11-22 MED ORDER — LIDOCAINE 2% (20 MG/ML) 5 ML SYRINGE
INTRAMUSCULAR | Status: AC
  Filled 2022-11-22: qty 5

## 2022-11-22 MED ORDER — ACETAMINOPHEN 500 MG PO TABS
1000.0000 mg | ORAL_TABLET | Freq: Once | ORAL | Status: AC
  Administered 2022-11-22: 1000 mg via ORAL
  Filled 2022-11-22: qty 2

## 2022-11-22 MED ORDER — MIDAZOLAM HCL 2 MG/2ML IJ SOLN
INTRAMUSCULAR | Status: DC | PRN
  Administered 2022-11-22: 2 mg via INTRAVENOUS

## 2022-11-22 MED ORDER — DEXMEDETOMIDINE HCL IN NACL 80 MCG/20ML IV SOLN
INTRAVENOUS | Status: AC
  Filled 2022-11-22: qty 20

## 2022-11-22 MED ORDER — GLYCOPYRROLATE PF 0.2 MG/ML IJ SOSY
PREFILLED_SYRINGE | INTRAMUSCULAR | Status: DC | PRN
  Administered 2022-11-22 (×2): .1 mg via INTRAVENOUS

## 2022-11-22 MED ORDER — PROPOFOL 10 MG/ML IV BOLUS
INTRAVENOUS | Status: AC
  Filled 2022-11-22: qty 20

## 2022-11-22 MED ORDER — FENTANYL CITRATE (PF) 250 MCG/5ML IJ SOLN
INTRAMUSCULAR | Status: AC
  Filled 2022-11-22: qty 5

## 2022-11-22 MED ORDER — CHLORHEXIDINE GLUCONATE 0.12 % MT SOLN
OROMUCOSAL | Status: AC
  Administered 2022-11-22: 15 mL via OROMUCOSAL
  Filled 2022-11-22: qty 15

## 2022-11-22 MED ORDER — PHENYLEPHRINE 80 MCG/ML (10ML) SYRINGE FOR IV PUSH (FOR BLOOD PRESSURE SUPPORT)
PREFILLED_SYRINGE | INTRAVENOUS | Status: AC
  Filled 2022-11-22: qty 10

## 2022-11-22 MED ORDER — CHLORHEXIDINE GLUCONATE 0.12 % MT SOLN: 15.0000 mL | Freq: Once | OROMUCOSAL | Status: AC

## 2022-11-22 MED ORDER — MIDAZOLAM HCL 2 MG/2ML IJ SOLN
INTRAMUSCULAR | Status: AC
  Filled 2022-11-22: qty 2

## 2022-11-22 MED ORDER — EPHEDRINE 5 MG/ML INJ
INTRAVENOUS | Status: AC
  Filled 2022-11-22: qty 5

## 2022-11-22 MED ORDER — FENTANYL CITRATE (PF) 250 MCG/5ML IJ SOLN
INTRAMUSCULAR | Status: DC | PRN
  Administered 2022-11-22 (×2): 25 ug via INTRAVENOUS
  Administered 2022-11-22: 50 ug via INTRAVENOUS

## 2022-11-22 MED ORDER — EPHEDRINE SULFATE-NACL 50-0.9 MG/10ML-% IV SOSY
PREFILLED_SYRINGE | INTRAVENOUS | Status: DC | PRN
  Administered 2022-11-22 (×2): 5 mg via INTRAVENOUS

## 2022-11-22 MED ORDER — LIDOCAINE 2% (20 MG/ML) 5 ML SYRINGE
INTRAMUSCULAR | Status: DC | PRN
  Administered 2022-11-22: 100 mg via INTRAVENOUS

## 2022-11-22 MED ORDER — LIDOCAINE-EPINEPHRINE 2 %-1:100000 IJ SOLN
INTRAMUSCULAR | Status: AC
  Filled 2022-11-22: qty 1

## 2022-11-22 MED ORDER — PROPOFOL 10 MG/ML IV BOLUS
INTRAVENOUS | Status: DC | PRN
  Administered 2022-11-22: 200 mg via INTRAVENOUS

## 2022-11-22 MED ORDER — GLYCOPYRROLATE PF 0.2 MG/ML IJ SOSY
PREFILLED_SYRINGE | INTRAMUSCULAR | Status: AC
  Filled 2022-11-22: qty 1

## 2022-11-22 MED ORDER — LACTATED RINGERS IV SOLN: INTRAVENOUS | Status: DC

## 2022-11-22 SURGICAL SUPPLY — 53 items
APL PRP STRL LF DISP 70% ISPRP (MISCELLANEOUS)
APL SKNCLS STERI-STRIP NONHPOA (GAUZE/BANDAGES/DRESSINGS)
BAG COUNTER SPONGE SURGICOUNT (BAG) ×2 IMPLANT
BAG SPNG CNTER NS LX DISP (BAG) ×1
BENZOIN TINCTURE PRP APPL 2/3 (GAUZE/BANDAGES/DRESSINGS) ×2 IMPLANT
BLADE AVERAGE 25X9 (BLADE) IMPLANT
BLADE OSCILLATING /SAGITTAL (BLADE) ×2 IMPLANT
BLADE SURG 15 STRL LF DISP TIS (BLADE) IMPLANT
BLADE SURG 15 STRL SS (BLADE) ×3
BNDG ELASTIC 4X5.8 VLCR STR LF (GAUZE/BANDAGES/DRESSINGS) IMPLANT
BNDG GAUZE DERMACEA FLUFF 4 (GAUZE/BANDAGES/DRESSINGS) ×2 IMPLANT
BNDG GZE DERMACEA 4 6PLY (GAUZE/BANDAGES/DRESSINGS) ×1
CHLORAPREP W/TINT 26 (MISCELLANEOUS) IMPLANT
COVER SURGICAL LIGHT HANDLE (MISCELLANEOUS) ×2 IMPLANT
CUFF TOURN SGL QUICK 18X4 (TOURNIQUET CUFF) ×2 IMPLANT
CUFF TOURN SGL QUICK 24 (TOURNIQUET CUFF)
CUFF TRNQT CYL 24X4X16.5-23 (TOURNIQUET CUFF) IMPLANT
DRSG ADAPTIC 3X8 NADH LF (GAUZE/BANDAGES/DRESSINGS) IMPLANT
ELECT REM PT RETURN 9FT ADLT (ELECTROSURGICAL)
ELECTRODE REM PT RTRN 9FT ADLT (ELECTROSURGICAL) IMPLANT
GAUZE PACKING IODOFORM 1/4X15 (PACKING) ×2 IMPLANT
GAUZE PAD ABD 8X10 STRL (GAUZE/BANDAGES/DRESSINGS) ×2 IMPLANT
GAUZE SPONGE 4X4 12PLY STRL (GAUZE/BANDAGES/DRESSINGS) ×2 IMPLANT
GAUZE XEROFORM 1X8 LF (GAUZE/BANDAGES/DRESSINGS) ×2 IMPLANT
GLOVE BIO SURGEON STRL SZ8 (GLOVE) ×2 IMPLANT
GLOVE BIOGEL PI IND STRL 8 (GLOVE) ×2 IMPLANT
GOWN STRL REUS W/ TWL LRG LVL3 (GOWN DISPOSABLE) ×4 IMPLANT
GOWN STRL REUS W/TWL LRG LVL3 (GOWN DISPOSABLE) ×2
GRAFT SKIN WND MICRO 38 (Tissue) IMPLANT
HANDPIECE INTERPULSE COAX TIP (DISPOSABLE) ×1
KIT BASIN OR (CUSTOM PROCEDURE TRAY) ×2 IMPLANT
KIT TURNOVER KIT B (KITS) ×2 IMPLANT
NDL PRECISIONGLIDE 27X1.5 (NEEDLE) ×2 IMPLANT
NEEDLE PRECISIONGLIDE 27X1.5 (NEEDLE) ×1 IMPLANT
NS IRRIG 1000ML POUR BTL (IV SOLUTION) ×2 IMPLANT
PACK ORTHO EXTREMITY (CUSTOM PROCEDURE TRAY) ×2 IMPLANT
PAD ARMBOARD 7.5X6 YLW CONV (MISCELLANEOUS) ×4 IMPLANT
PAD CAST 4YDX4 CTTN HI CHSV (CAST SUPPLIES) IMPLANT
PADDING CAST COTTON 4X4 STRL (CAST SUPPLIES)
SET HNDPC FAN SPRY TIP SCT (DISPOSABLE) IMPLANT
SOL PREP POV-IOD 4OZ 10% (MISCELLANEOUS) ×2 IMPLANT
STAPLER VISISTAT 35W (STAPLE) IMPLANT
STRIP CLOSURE SKIN 1/2X4 (GAUZE/BANDAGES/DRESSINGS) ×2 IMPLANT
SUT PROLENE 2 0 CT 1 (SUTURE) IMPLANT
SUT PROLENE 3 0 CT 1 (SUTURE) IMPLANT
SUT PROLENE 3 0 PS 2 (SUTURE) IMPLANT
SUT PROLENE 4 0 PS 2 18 (SUTURE) IMPLANT
SUT VIC AB 3-0 PS2 18 (SUTURE) IMPLANT
SUT VICRYL 4-0 PS2 18IN ABS (SUTURE) ×2 IMPLANT
SYR CONTROL 10ML LL (SYRINGE) ×4 IMPLANT
TOWEL GREEN STERILE (TOWEL DISPOSABLE) ×2 IMPLANT
TUBE CONNECTING 12X1/4 (SUCTIONS) ×2 IMPLANT
YANKAUER SUCT BULB TIP NO VENT (SUCTIONS) IMPLANT

## 2022-11-22 NOTE — Op Note (Addendum)
Full Operative Report  Date of Operation: 5:22 PM, 11/22/2022   Patient: Carl Conley - 37 y.o. male  Surgeon: Yevonne Pax, DPM   Assistant: None  Diagnosis: Osteomyelitis Left foot  Procedure:  1. Tendo achilles lengthening, left lower extremity 2. Transmetatarsal amputation, left foot    Anesthesia: Anesthesia type not filed in the log.  Audry Pili, MD  Anesthesiologist: Audry Pili, MD; Duane Boston, MD CRNA: Ester Rink, CRNA; Janene Harvey, CRNA   Estimated Blood Loss: Minimal  Hemostasis: 1) Anatomical dissection, mechanical compression, electrocautery 2) Thigh tourniquet inflated to 300 mm Hg  Implants: Implant Name Type Inv. Item Serial No. Manufacturer Lot No. LRB No. Used Action  GRAFT SKIN WND MICRO 38 - [id number] Tissue GRAFT SKIN WND MICRO 38  [employer] [id number] Left 1 Implanted    Materials: Prolene  Injectables: 1) Pre-operatively: None 2) Post-operatively: None  Specimens: Left forefoot toes for pathology   Antibiotics: Given as scheduled for the floor  Drains: None  Complications: Patient tolerated the procedure well without complication.   Findings: as below  Indications for Procedure: TRAYLEN ECKELS presents to Yevonne Pax, DPM with a chief complaint of open ulceration and residual osteomyelitis of the left forefoot as well as multiple open ulcerations.  The patient has failed conservative treatments of various modalities. At this time the patient has elected to proceed with surgical correction. All alternatives, risks, and complications of the procedures were thoroughly explained to the patient. Patient exhibits appropriate understanding of all discussion points and informed consent was signed and obtained in the chart with no guarantees to surgical outcome given or implied.  Description of Procedure: Patient was brought to the operating room and placed on the operative table in the supine  position. Patient was secured to the table with safety belt, a contralateral SCD was placed, and all bony prominences were well padded. A surgical timeout was performed and all members of the operating room, the procedure, and the surgical site were identified. General anesthesia occurred. Local anesthetic as previously described was then injected about the operative field in a local infiltrative block.   A well padded pneumatic tourniquet was placed to the operative limb. The left lower extremity was then prepped and draped in the usual sterile manner. The pneumatic tourniquet was inflatedto 300 mm hg. The following procedure then began.  Attention was directed to the posterior left ankle where the Achilles tendon was identified using topographical anatomy.  Here, a triple hemisection was performed to lengthen the Achilles tendon and thus improve dorsiflexion of the foot. Two centimeters from the Achilles insertion, a 15 blade was inserted parallel with the long axis of the tendon and then rotated 90 degrees medially to release the medial half of the tendon.  In a similar fashion, the 15 blade was then inserted midline of the Achilles tendon 4 cm from the insertion and rotated 90 degrees laterally to transect the lateral half of the tendon.  Lastly, the 15 blade was inserted midline of the tendon 6 cm proximal from the insertion and rotated 90 degrees medially to release the medial half of the tendon. This was all done under passive dorsiflexion and an increase in ankle range of motion in the sagittal plane was noted.  All 3 sites were then flushed with sterile normal saline and closed with the suture material previously described.   Attention was directed to the left lower extremity. A fish-mouth type incision was made proximal to the  web spaces and encompassed the entire forefoot. The full-thickness incision was made with a longer plantar flap to allow for wound closure. The incision was continued through  the soft tissue down to the shafts of the metatarsal bones. A key elevator was then used to free up the periosteum on all the metatarsal shafts. Using an oscillating saw, the metatarsals were cut in a dorsal distal to plantar proximal orientation. The first metatarsal was beveled so that the medial cortex was shorter than the lateral, and the fifth metatarsal was beveled so that the lateral cortex was shorter than the medial, thus less prominent. The amputation was done so that a metatarsal parabola was maintained.  The distal portion including all the digits were freed from the metatarsals and soft tissue attachments. The specimen was passed off the field and sent for gross pathology. All remaining non-viable and necrotic tissues were sharply resected and removed. Extensor and flexors tendons were grasped with a hemostat and cut proximally. Tourniquet was used. All distal portions of the metatarsals were than smoothed with a rasp. The surgical site was then flushed with 1029ml of saline under power pulse lavage. The plantar flap was brought in approximation with the dorsal flap and the sutures material previously described was used for closure. Care was taken not to place the flaps under tension in order not to jeopardize the vascular supply.  The surgical site was then dressed with betadine adaptic,4x4 abd kerlix. The tourniquet was deflated after 48 minutes with immediate vascular return noted to the operative foot. The patient tolerated both the procedure and anesthesia well with vital signs stable throughout. The patient was transferred from the OR to recovery under the discretion of anesthesia.  Condition: Patient was taken to PACU in good condition and all vital signs stable and neurovascular status intact to the operative limb.  Discharge: Pt will be returned to floor for ongoing wound care and monitoring post op  Jamesetta Greenhalgh, Nena Kamill Fulbright, DPM will follow the patient throughout the entire  post-operative course and the patient is aware of all post-operative protocols in place. The patient will follow the protocol of rest, ice, and elevation. The patient will be non weightbearing to left side in a CAM boot to the operative limb until further instructed. The dressing is to remain clean, dry, and intact.   Ames Coupe, DPM

## 2022-11-22 NOTE — Progress Notes (Signed)
Subjective:  NAEON.  Npo for surgery today. Pain well managed, no PRNs needed. No n/v.  Discussed plan including procedure today and he is understanding. He did want [place] to call wellness check on his mother at home at this point.   Objective:  Vital signs in last 24 hours: Vitals:   11/21/22 0424 11/21/22 2055 11/22/22 0429 11/22/22 0600  BP: 118/78 101/67 101/62   Pulse: 80 80 64   Resp: 18 17 17    Temp: 98 F (36.7 C) 98.7 F (37.1 C)  98 F (36.7 C)  TempSrc:  Oral  Oral  SpO2: 100% 97% 100%   Height:       Physical Exam: General: Awake and alert, not in acute distress Cardiac: RRR, no MRG Respiratory: CTAB, normal work of breathing on room air Extremities: Bilateral feet covered in dressings. S/p amputation of R 2nd toe at MTP joint and L partial 2nd ray amputation  Neuro: Alert and oriented x4.  Psych: flat affect    Assessment/Plan:  Principal Problem:   Diabetic osteomyelitis ([place]) Active Problems:   Osteomyelitis of left foot (HCC)  Carl Conley is a 37 year old man with a PMHx significant for T2DM, NAFLD, ADHD, and Depression who presents for bilateral foot wounds and admitted for management of bilateral osteomyelitis.    Bilateral Feet Osteomyelitis 2/2 to diabetic neuropathy Status post bilateral partial lower extremity amputations Presented w/ worsening bilateral foot wounds over the last 6 months. Previously seen in clinic, started on augmentin/doxycycline w/o resolution. Feet XRAY from 1/11 demonstrate bony destruction of the second toes bilaterally, and bony irregularity and periosteal reaction of the left metatarsal shaft consistent with osteomyelitis. CRP elevated at 1.1. ESR elevated at 49. MRI of L/R foot on admission with osteo.  The patient is now POD #3 following amputation of R 2nd toe at MTP joint, R distal fibula biopsy, debridement of R ankle, L partial 2nd ray amputation, and 1st and 3rd metatarsal head bone biopsies. Cultures thus far are  growing MSSA and pseudomonas. R distal fibula bx also showing osteo, discussed with vascular and podiatry and they recommended attempting conservative management with abx and followup surgery only if necessary.  Will follow with ID to determine appropriate abx regimen and can likely focus on dispo after his TMA today. - appreciate podiatry, vascular, ID recs - plan for f/u ABI in 1 year  - continue to monitor cultures - d/c Vancomycin per pharmacy - Continue IV Cefepime per pharmacy. Determine appropriate outpatient regimen - Trend CBC - Scheduled Tylenol 1000 3 times daily, oxycodone 10 mg prn every 8 for severe pain - PT/OT after follow-up surgery   Hyperkalemia (resolved) Mild hyponatremia (resolved)   T2DM A1c 6.5% from 10/2022.  On Invokamet 150-1000mg  BID at home. CBGs largely well managed postop -Hold home Invokamet -SSI  -Trend CBGs, maintain < 180 postop   Hx of depression Chronic condition. Takes citalopram at home.  -Continue home citalopram 20mg    Hx of HSV keratitis Diagnosed in 2017. Seen by Dr. Katy Conley, ophthalmology. Takes acyclovir at home.  -Continue home acyclovir 400mg  BID   DVT Prophylaxis: Held today  Diet: Carb modified, NPO at midnight  Bowel: miralax PRN IVF: none Code: Full   Prior to Admission Living Arrangement: home w/ mother Anticipated Discharge Location: TBD Barriers to Discharge: continued management Dispo: Anticipated discharge in approximately more than 2 day(s).   Carl Galas, MD 11/22/2022, 7:26 AM Pager: [phone] After 5pm on weekdays and 1pm on weekends: On Call  pager [phone]

## 2022-11-22 NOTE — Anesthesia Preprocedure Evaluation (Addendum)
Anesthesia Evaluation  Patient identified by MRN, date of birth, ID band Patient awake    Reviewed: Allergy & Precautions, H&P , NPO status , Patient's Chart, lab work & pertinent test results  History of Anesthesia Complications Negative for: history of anesthetic complications  Airway Mallampati: II  TM Distance: >3 FB Neck ROM: Full    Dental no notable dental hx. (+) Dental Advisory Given   Pulmonary neg pulmonary ROS   Pulmonary exam normal        Cardiovascular negative cardio ROS Normal cardiovascular exam     Neuro/Psych  PSYCHIATRIC DISORDERS  Depression    negative neurological ROS     GI/Hepatic negative GI ROS, Neg liver ROS,,,  Endo/Other  diabetes, Insulin Dependent    Renal/GU negative Renal ROS  negative genitourinary   Musculoskeletal negative musculoskeletal ROS (+)    Abdominal   Peds negative pediatric ROS (+)  Hematology  (+) Blood dyscrasia, anemia   Anesthesia Other Findings   Reproductive/Obstetrics negative OB ROS                             Anesthesia Physical Anesthesia Plan  ASA: 3  Anesthesia Plan: General   Post-op Pain Management: Tylenol PO (pre-op)*   Induction: Intravenous  PONV Risk Score and Plan: 2 and Ondansetron and Treatment may vary due to age or medical condition  Airway Management Planned: LMA  Additional Equipment:   Intra-op Plan:   Post-operative Plan: Extubation in OR  Informed Consent: I have reviewed the patients History and Physical, chart, labs and discussed the procedure including the risks, benefits and alternatives for the proposed anesthesia with the patient or authorized representative who has indicated his/her understanding and acceptance.     Dental advisory given  Plan Discussed with: Anesthesiologist and CRNA  Anesthesia Plan Comments:        Anesthesia Quick Evaluation

## 2022-11-22 NOTE — Anesthesia Procedure Notes (Signed)
Procedure Name: LMA Insertion Date/Time: 11/22/2022 4:08 PM  Performed by: Ester Rink, CRNAPre-anesthesia Checklist: Patient identified, Emergency Drugs available, Suction available and Patient being monitored Patient Re-evaluated:Patient Re-evaluated prior to induction Oxygen Delivery Method: Circle system utilized Preoxygenation: Pre-oxygenation with 100% oxygen Induction Type: IV induction Ventilation: Mask ventilation without difficulty LMA: LMA inserted LMA Size: 5.0 Tube type: Oral Number of attempts: 1 Placement Confirmation: positive ETCO2 and breath sounds checked- equal and bilateral Tube secured with: Tape Dental Injury: Teeth and Oropharynx as per pre-operative assessment

## 2022-11-22 NOTE — Progress Notes (Signed)
Pt. Off unit to OR.

## 2022-11-22 NOTE — Progress Notes (Signed)
Orthopedic Tech Progress Note Patient Details:  Carl Conley 03-22-1986 [id number]  Ortho Devices Type of Ortho Device: CAM walker Ortho Device/Splint Location: Dropped off to OR desk for OR 4 Ortho Device/Splint Interventions: Ordered   Post Interventions Patient Tolerated: Well Instructions Provided: Adjustment of device  [place] Kimberlin Jeri Modena 11/22/2022, 5:47 PM

## 2022-11-22 NOTE — Progress Notes (Signed)
PODIATRY PROGRESS NOTE Patient Name: Carl Conley  DOB 08/03/1986 DOA 11/17/2022  Hospital Day: 6  Assessment:  37 y.o. male with PMHx significant for  T2DM with bilateral foot ulcerations and osteomyelitis, Right foot with ankle ulceration to subcatenous fat tissue without evidence of underlying OM, and 2nd toe ulceration with chronic OM. Left foot with 2nd toe ulceration with chronic OM of the 2nd ray as well as ulceration plantar 5th met head.    POD 3 s/p left foot partial second ray amputation and third and first metatarsal bone biopsy, right foot second digit amputation and right ankle wound debridement and distal fibula bone biopsy progressing well  AF, VSS  Wound/Bone Cultures: Bone biopsy distal fibula right: Osteomyelitis acute; right second toe: Acute osteomyelitis; left second ray acute osteomyelitis; bone left third metatarsal head: Focal mild acute inflammation; first metatarsal bone biopsy chronic and focal acute inflammation  OR Cultures Staph aureus and Pseudomonas  Imaging:   MRI R foot: 1. Acute OM 2nd toe. 2. Possible OM 2nd met head vs reactive change. 3. Bone marrow edema lateral malleolus possible OM.    MRI L foot: OM 2nd ray, 3rd ray, possible 1st ray, possible OM 5th met head and proximal phalanx.   XR R foot: Bony destruction of the middle phalanx of the second toe and irregularity of the tuft of the distal phalanx concerning for osteomyelitis. There is overlying soft tissue defect.   XR R ankle: No acute fx or erosions, soft tissue edema   XR L foot: Extensive bony destruction involving the left second toe throughout the distal, middle and proximal phalanx and head of the second metatarsal compatible with osteomyelitis. There is pronounced periosteal reaction and bony irregularity of the metatarsal shaft concerning for additional involvement.  Plan:  - NPO for OR for left foot transmetatarsal amputation and TAL left foot -Appreciate vascular  recommendations.  Do not feel patient will require BKA for the right lower extremity at this time would try treatment with extended duration IV antibiotics and wound care prior to proceeding with that though he is at risk for that given fibula osteomyelitis.  Do not feel that angiogram is warranted at this time patient did bleed well during the initial procedure. -Appreciate ID recommendations - Anticoagulation: Okay to restart tomorrow per primary - WB status: WB at tolerated BLE in postop shoe.  Nonweightbearing to the left lower extremity Will continue to follow        Everitt Amber, DPM [facility]    Subjective:  Pt seen in pre op, aware of plans today for transmetatarsal amputation of the left foot.  Also discussed that he does have positive osteomyelitis in the right ankle which will need to be treated with long-term antibiotics.  All questions answered  Objective:   Vitals:   11/22/22 0600 11/22/22 0731  BP:  99/66  Pulse:  66  Resp:  18  Temp: 98 F (36.7 C) 98 F (36.7 C)  SpO2:  98%       Latest Ref Rng & Units 11/22/2022    6:05 AM 11/21/2022    4:47 AM 11/20/2022    3:54 AM  CBC  WBC 4.0 - 10.5 K/uL 8.5  11.0  10.4   Hemoglobin 13.0 - 17.0 g/dL 10.6  11.4  11.1   Hematocrit 39.0 - 52.0 % 33.9  35.3  34.5   Platelets 150 - 400 K/uL 207  219  210  Latest Ref Rng & Units 11/22/2022    6:05 AM 11/21/2022    4:47 AM 11/20/2022    2:33 PM  BMP  Glucose 70 - 99 mg/dL 156  155  149   BUN 6 - 20 mg/dL 19  19  16    Creatinine 0.61 - 1.24 mg/dL 0.96  0.98  0.98   Sodium 135 - 145 mmol/L 135  134  135   Potassium 3.5 - 5.1 mmol/L 3.9  4.2  4.3   Chloride 98 - 111 mmol/L 104  104  104   CO2 22 - 32 mmol/L 21  21  24    Calcium 8.9 - 10.3 mg/dL 9.1  9.3  9.3     General: AAOx3, NAD  Lower Extremity Exam Lower Extremity Exam Vasc:     R - PT palpable, DP palpable. Cap refill < 3 sec to digits aside from 2nd toe               L - PT palpable,  DP palpable. Cap refill <3 sec to digits   Derm:    R -dressing clean dry and intact                  L -dressing clean dry and intact     MSK:     R -status post right second toe amputation right ankle wound debridement               L -status post left second partial ray amputation   Neuro:   R - Gross sensation absent to forefoot. Gross motor function intact                    L - Gross sensation absent to forefoot. Gross motor function intact   Radiology:  Results reviewed. See assessment for pertinent imaging results

## 2022-11-22 NOTE — Progress Notes (Signed)
[facility]  Date of Admission:  11/17/2022     Total days of antibiotics 6         ASSESSMENT:  Carl Conley cultures have updated now with Pseudomonas and MSSA. Scheduled for left transmetatarsal amputation today. Will discontinue vancomycin and continue current dose of Cefepime to cover Pseudomonas and MSSA. Planning for at least 4 weeks of antibiotic therapy pending surgical outcomes. Diabetes is well controlled. Vascular following and awaiting surgical outcome of today. Post-operative wound care per Podiatry. Remaining medical and supportive care per Primary Team.   PLAN:  Discontinue vancomycin. Continue Cefepime.  OR today for left foot.  Post-operative wound care per Podiatry. Remaining medical and supportive care per Primary Team.   Principal Problem:   Diabetic osteomyelitis (HCC) Active Problems:   Osteomyelitis of left foot (HCC)    [MAR Hold] acetaminophen  1,000 mg Oral TID   Or   [MAR Hold] acetaminophen  650 mg Rectal TID   [MAR Hold] acyclovir  400 mg Oral BID   [MAR Hold] citalopram  20 mg Oral Daily   [MAR Hold] insulin aspart  0-15 Units Subcutaneous TID WC   [MAR Hold] insulin aspart  0-5 Units Subcutaneous QHS    SUBJECTIVE:  Afebrile overnight with no acute events. No new concerns/complaints. Wishing surgery were earlier in the day.   No Known Allergies   Review of Systems: Review of Systems  Constitutional:  Negative for chills, fever and weight loss.  Respiratory:  Negative for cough, shortness of breath and wheezing.   Cardiovascular:  Negative for chest pain and leg swelling.  Gastrointestinal:  Negative for abdominal pain, constipation, diarrhea, nausea and vomiting.  Skin:  Negative for rash.      OBJECTIVE: Vitals:   11/22/22 0429 11/22/22 0600 11/22/22 0731 11/22/22 1437  BP: 101/62  99/66 97/67  Pulse: 64  66 73  Resp: 17  18 18   Temp:  98 F (36.7 C) 98 F (36.7 C) 98 F (36.7 C)  TempSrc:   Oral Oral Oral  SpO2: 100%  98% 99%  Weight:    101 kg  Height:    5\' 11"  (1.803 m)   Body mass index is 31.06 kg/m.  Physical Exam Constitutional:      General: He is not in acute distress.    Appearance: He is well-developed.  Cardiovascular:     Rate and Rhythm: Normal rate and regular rhythm.     Heart sounds: Normal heart sounds.  Pulmonary:     Effort: Pulmonary effort is normal.     Breath sounds: Normal breath sounds.  Skin:    General: Skin is warm and dry.  Neurological:     Mental Status: He is alert and oriented to person, place, and time.  Psychiatric:        Behavior: Behavior normal.        Thought Content: Thought content normal.        Judgment: Judgment normal.     Lab Results Lab Results  Component Value Date   WBC 8.5 11/22/2022   HGB 10.6 (L) 11/22/2022   HCT 33.9 (L) 11/22/2022   MCV 82.7 11/22/2022   PLT 207 11/22/2022    Lab Results  Component Value Date   CREATININE 0.96 11/22/2022   BUN 19 11/22/2022   NA 135 11/22/2022   K 3.9 11/22/2022   CL 104 11/22/2022   CO2 21 (L) 11/22/2022    Lab Results  Component Value Date  ALT 25 11/17/2022   AST 32 11/17/2022   ALKPHOS 71 11/17/2022   BILITOT 0.4 11/17/2022     Microbiology: Recent Results (from the past 240 hour(s))  Aerobic/Anaerobic Culture w Gram Stain (surgical/deep wound)     Status: None (Preliminary result)   Collection Time: 11/19/22  8:24 AM   Specimen: Bone; Tissue  Result Value Ref Range Status   Specimen Description BONE  Final   Special Requests  RIGHT FIBULA  Final   Gram Stain   Final    NO ORGANISMS SEEN NO WBC SEEN Performed at [facility], [address], [place], [place] [address]    Culture   Final    RARE PSEUDOMONAS AERUGINOSA SUSCEPTIBILITIES PERFORMED ON PREVIOUS CULTURE WITHIN THE LAST 5 DAYS. NO ANAEROBES ISOLATED; CULTURE IN PROGRESS FOR 5 DAYS    Report Status PENDING  Incomplete  Aerobic/Anaerobic Culture w Gram Stain (surgical/deep  wound)     Status: None (Preliminary result)   Collection Time: 11/19/22  8:26 AM   Specimen: Wound  Result Value Ref Range Status   Specimen Description WOUND  Final   Special Requests  MPJ RIGHT  Final   Gram Stain NO WBC SEEN NO ORGANISMS SEEN   Final   Culture   Final    NO GROWTH 3 DAYS NO ANAEROBES ISOLATED; CULTURE IN PROGRESS FOR 5 DAYS Performed at [facility], [address], [place], [place] [address]    Report Status PENDING  Incomplete  Aerobic/Anaerobic Culture w Gram Stain (surgical/deep wound)     Status: None (Preliminary result)   Collection Time: 11/19/22  8:38 AM   Specimen: Bone; Tissue  Result Value Ref Range Status   Specimen Description BONE  Final   Special Requests  LEFT METATARSAL  Final   Gram Stain   Final    FEW WBC PRESENT,BOTH PMN AND MONONUCLEAR NO ORGANISMS SEEN Performed at [facility], [address], [place], [place] [address]    Culture   Final    RARE STAPHYLOCOCCUS AUREUS NO ANAEROBES ISOLATED; CULTURE IN PROGRESS FOR 5 DAYS    Report Status PENDING  Incomplete   Organism ID, Bacteria STAPHYLOCOCCUS AUREUS  Final      Susceptibility   Staphylococcus aureus - MIC*    CIPROFLOXACIN <=0.5 SENSITIVE Sensitive     ERYTHROMYCIN <=0.25 SENSITIVE Sensitive     GENTAMICIN <=0.5 SENSITIVE Sensitive     OXACILLIN 0.5 SENSITIVE Sensitive     TETRACYCLINE <=1 SENSITIVE Sensitive     VANCOMYCIN <=0.5 SENSITIVE Sensitive     TRIMETH/SULFA <=10 SENSITIVE Sensitive     CLINDAMYCIN <=0.25 SENSITIVE Sensitive     RIFAMPIN <=0.5 SENSITIVE Sensitive     Inducible Clindamycin NEGATIVE Sensitive     * RARE STAPHYLOCOCCUS AUREUS  Aerobic/Anaerobic Culture w Gram Stain (surgical/deep wound)     Status: None (Preliminary result)   Collection Time: 11/19/22  8:51 AM   Specimen: Wound  Result Value Ref Range Status   Specimen Description WOUND  Final   Special Requests  FIRST MPJ LEFT FOOT  Final   Gram Stain   Final    RARE WBC PRESENT,  PREDOMINANTLY PMN NO ORGANISMS SEEN Performed at [facility], [address], [place], [place] [address]    Culture   Final    RARE PSEUDOMONAS AERUGINOSA RARE STAPHYLOCOCCUS AUREUS SUSCEPTIBILITIES PERFORMED ON PREVIOUS CULTURE WITHIN THE LAST 5 DAYS. NO ANAEROBES ISOLATED; CULTURE IN PROGRESS FOR 5 DAYS  Report Status PENDING  Incomplete   Organism ID, Bacteria PSEUDOMONAS AERUGINOSA  Final      Susceptibility   Pseudomonas aeruginosa - MIC*    CEFTAZIDIME 4 SENSITIVE Sensitive     CIPROFLOXACIN <=0.25 SENSITIVE Sensitive     GENTAMICIN <=1 SENSITIVE Sensitive     IMIPENEM 1 SENSITIVE Sensitive     PIP/TAZO 8 SENSITIVE Sensitive     CEFEPIME 2 SENSITIVE Sensitive     * RARE PSEUDOMONAS AERUGINOSA  Aerobic/Anaerobic Culture w Gram Stain (surgical/deep wound)     Status: None (Preliminary result)   Collection Time: 11/19/22  9:00 AM   Specimen: Wound  Result Value Ref Range Status   Specimen Description WOUND  Final   Special Requests  SECOND MPJ LEFT FOOT  Final   Gram Stain   Final    NO WBC SEEN NO ORGANISMS SEEN Performed at [facility], [place] [address] [place], [place] [address]    Culture   Final    RARE STAPHYLOCOCCUS AUREUS SUSCEPTIBILITIES PERFORMED ON PREVIOUS CULTURE WITHIN THE LAST 5 DAYS. NO ANAEROBES ISOLATED; CULTURE IN PROGRESS FOR 5 DAYS    Report Status PENDING  Incomplete     Terri Piedra, NP [facility]  11/22/2022  2:51 PM

## 2022-11-22 NOTE — Plan of Care (Signed)
  Problem: Education: Goal: Knowledge of General Education information will improve Description: Including pain rating scale, medication(s)/side effects and non-pharmacologic comfort measures Outcome: Progressing   Problem: Health Behavior/Discharge Planning: Goal: Ability to manage health-related needs will improve Outcome: Progressing   Problem: Clinical Measurements: Goal: Ability to maintain clinical measurements within normal limits will improve Outcome: Progressing Goal: Will remain free from infection Outcome: Progressing Goal: Diagnostic test results will improve Outcome: Progressing Goal: Respiratory complications will improve Outcome: Progressing Goal: Cardiovascular complication will be avoided Outcome: Progressing   Problem: Activity: Goal: Risk for activity intolerance will decrease Outcome: Progressing   Problem: Nutrition: Goal: Adequate nutrition will be maintained Outcome: Progressing   Problem: Coping: Goal: Level of anxiety will decrease Outcome: Progressing   Problem: Elimination: Goal: Will not experience complications related to bowel motility Outcome: Progressing Goal: Will not experience complications related to urinary retention Outcome: Progressing   Problem: Pain Managment: Goal: General experience of comfort will improve Outcome: Progressing   Problem: Safety: Goal: Ability to remain free from injury will improve Outcome: Progressing   Problem: Skin Integrity: Goal: Risk for impaired skin integrity will decrease Outcome: Progressing   Problem: Education: Goal: Knowledge of General Education information will improve Description: Including pain rating scale, medication(s)/side effects and non-pharmacologic comfort measures Outcome: Progressing   Problem: Health Behavior/Discharge Planning: Goal: Ability to manage health-related needs will improve Outcome: Progressing   Problem: Clinical Measurements: Goal: Ability to maintain  clinical measurements within normal limits will improve Outcome: Progressing Goal: Will remain free from infection Outcome: Progressing Goal: Diagnostic test results will improve Outcome: Progressing Goal: Respiratory complications will improve Outcome: Progressing Goal: Cardiovascular complication will be avoided Outcome: Progressing   Problem: Activity: Goal: Risk for activity intolerance will decrease Outcome: Progressing   Problem: Nutrition: Goal: Adequate nutrition will be maintained Outcome: Progressing   Problem: Coping: Goal: Level of anxiety will decrease Outcome: Progressing   Problem: Elimination: Goal: Will not experience complications related to bowel motility Outcome: Progressing Goal: Will not experience complications related to urinary retention Outcome: Progressing   Problem: Pain Managment: Goal: General experience of comfort will improve Outcome: Progressing   Problem: Safety: Goal: Ability to remain free from injury will improve Outcome: Progressing   Problem: Skin Integrity: Goal: Risk for impaired skin integrity will decrease Outcome: Progressing   Problem: Education: Goal: Ability to describe self-care measures that may prevent or decrease complications (Diabetes Survival Skills Education) will improve Outcome: Progressing Goal: Individualized Educational Video(s) Outcome: Progressing   Problem: Coping: Goal: Ability to adjust to condition or change in health will improve Outcome: Progressing   Problem: Fluid Volume: Goal: Ability to maintain a balanced intake and output will improve Outcome: Progressing   Problem: Health Behavior/Discharge Planning: Goal: Ability to identify and utilize available resources and services will improve Outcome: Progressing Goal: Ability to manage health-related needs will improve Outcome: Progressing   Problem: Metabolic: Goal: Ability to maintain appropriate glucose levels will improve Outcome:  Progressing   Problem: Nutritional: Goal: Maintenance of adequate nutrition will improve Outcome: Progressing Goal: Progress toward achieving an optimal weight will improve Outcome: Progressing   Problem: Skin Integrity: Goal: Risk for impaired skin integrity will decrease Outcome: Progressing   Problem: Tissue Perfusion: Goal: Adequacy of tissue perfusion will improve Outcome: Progressing   

## 2022-11-22 NOTE — Transfer of Care (Signed)
Immediate Anesthesia Transfer of Care Note  Patient: Carl Conley  Procedure(s) Performed: TRANSMETATARSAL AMPUTATION LEFT FOOT, TENDON ACHILLES LENGTHENING (Left: Toe)  Patient Location: PACU  Anesthesia Type:General  Level of Consciousness: drowsy and patient cooperative  Airway & Oxygen Therapy: Patient Spontanous Breathing and Patient connected to face mask oxygen  Post-op Assessment: Report given to RN and Post -op Vital signs reviewed and stable  Post vital signs: Reviewed and stable  Last Vitals:  Vitals Value Taken Time  BP 110/71 11/22/22 1724  Temp    Pulse 90 11/22/22 1726  Resp 14 11/22/22 1726  SpO2 100 % 11/22/22 1726  Vitals shown include unvalidated device data.  Last Pain:  Vitals:   11/22/22 1441  TempSrc:   PainSc: 0-No pain         Complications: No notable events documented.

## 2022-11-22 NOTE — Progress Notes (Signed)
Pt. Arrived to unit alert and oriented X4 no c/o pain, bed in lowest position call bell within reach

## 2022-11-23 ENCOUNTER — Inpatient Hospital Stay: Payer: Self-pay

## 2022-11-23 LAB — CBC
HCT: 32.2 % — ABNORMAL LOW (ref 39.0–52.0)
Hemoglobin: 10 g/dL — ABNORMAL LOW (ref 13.0–17.0)
MCH: 25.9 pg — ABNORMAL LOW (ref 26.0–34.0)
MCHC: 31.1 g/dL (ref 30.0–36.0)
MCV: 83.4 fL (ref 80.0–100.0)
Platelets: 210 10*3/uL (ref 150–400)
RBC: 3.86 MIL/uL — ABNORMAL LOW (ref 4.22–5.81)
RDW: 16 % — ABNORMAL HIGH (ref 11.5–15.5)
WBC: 7.5 10*3/uL (ref 4.0–10.5)
nRBC: 0 % (ref 0.0–0.2)

## 2022-11-23 LAB — BASIC METABOLIC PANEL
Anion gap: 8 (ref 5–15)
BUN: 15 mg/dL (ref 6–20)
CO2: 22 mmol/L (ref 22–32)
Calcium: 8.9 mg/dL (ref 8.9–10.3)
Chloride: 105 mmol/L (ref 98–111)
Creatinine, Ser: 1.09 mg/dL (ref 0.61–1.24)
GFR, Estimated: >60 mL/min (ref >60)
Glucose, Bld: 140 mg/dL — ABNORMAL HIGH (ref 70–99)
Potassium: 4.1 mmol/L (ref 3.5–5.1)
Sodium: 135 mmol/L (ref 135–145)

## 2022-11-23 LAB — GLUCOSE, CAPILLARY
Glucose-Capillary: 150 mg/dL — ABNORMAL HIGH (ref 70–99)
Glucose-Capillary: 132 mg/dL — ABNORMAL HIGH (ref 70–99)
Glucose-Capillary: 130 mg/dL — ABNORMAL HIGH (ref 70–99)
Glucose-Capillary: 160 mg/dL — ABNORMAL HIGH (ref 70–99)

## 2022-11-23 MED ORDER — CEFEPIME IV (FOR PTA / DISCHARGE USE ONLY): 2.0000 g | Freq: Three times a day (TID) | INTRAVENOUS | 0 refills | Status: AC

## 2022-11-23 MED ORDER — RIVAROXABAN 10 MG PO TABS
10.0000 mg | ORAL_TABLET | Freq: Every day | ORAL | Status: DC
  Administered 2022-11-23 – 2022-11-24 (×2): 10 mg via ORAL
  Filled 2022-11-23 (×2): qty 1

## 2022-11-23 NOTE — Progress Notes (Signed)
Subjective:  NAEON.  Postop day 1 after transmetatarsal amputation and Achilles tendon lengthening of left lower extremity.  No surgical complications.  Pain controlled postop.  Has not taken any PRNs.  No nausea or vomiting, tolerating p.o. intake.  Think he should be nonweightbearing LLE in cam boot, will clarify with podiatry  He is doing well today without any acute concerns.  Tolerating p.o. intake without any nausea or vomiting.  Discussed plan including PT OT, antibiotic selection and likely subsequent discharge and she is amenable.  Objective:  Vital signs in last 24 hours: Vitals:   11/22/22 1755 11/22/22 1805 11/22/22 1928 11/23/22 0504  BP: 103/75 103/73 91/61 96/65   Pulse: 83 79 83 62  Resp: 12 16 18 20   Temp: 98.3 F (36.8 C) 97.7 F (36.5 C) 98.2 F (36.8 C) 98.1 F (36.7 C)  TempSrc:  Oral Oral Oral  SpO2: 97% 99% 98% 99%  Weight:      Height:       Physical Exam: General: Awake and alert, not in acute distress Cardiac: RRR, no MRG Respiratory: CTAB, normal work of breathing on room air Extremities: Bilateral feet covered in dressings. S/p amputation of R 2nd toe at MTP joint and L TMA Neuro: Alert and oriented x4.     Assessment/Plan:  Principal Problem:   Diabetic osteomyelitis ([place]) Active Problems:   Osteomyelitis of left foot (HCC)  Carl Conley is a 37 year old man with a PMHx significant for T2DM, NAFLD, ADHD, and Depression who presents for bilateral foot wounds and admitted for management of bilateral osteomyelitis.    Bilateral Feet Osteomyelitis 2/2 to diabetic neuropathy Status post bilateral partial lower extremity amputations Presented w/ worsening bilateral foot wounds over the last 6 months. Previously seen in clinic, started on augmentin/doxycycline w/o resolution. Feet XRAY from 1/11 demonstrate bony destruction of the second toes bilaterally, and bony irregularity and periosteal reaction of the left metatarsal shaft consistent with  osteomyelitis. CRP elevated at 1.1. ESR elevated at 49. MRI of L/R foot on admission with osteo.  The patient is now s\p amputation of R 2nd toe at MTP joint, R distal fibula biopsy, debridement of R ankle, L TMA. Cultures thus far are growing MSSA and pseudomonas. R distal fibula bx also showing osteo, discussed with vascular and podiatry and they recommended attempting conservative management with abx and followup surgery only if necessary.  ID recommending outpatient IV cefepime.  Needs PICC line placed and ID follow-up after discharge. - appreciate podiatry, vascular, ID recs - plan for f/u ABI in 1 year  - continue to monitor cultures - Continue IV Cefepime per pharmacy.  Needs PICC line placement ID follow-up outpatient. - Trend CBC - Scheduled Tylenol 1000 3 times daily, oxycodone 10 mg prn every 8 for severe pain - PT/OT   Hyperkalemia (resolved) Mild hyponatremia (resolved)   T2DM A1c 6.5% from 10/2022.  On Invokamet 150-1000mg  BID at home. CBGs largely well managed postop -Hold home Invokamet -SSI  -Trend CBGs, maintain < 180 postop   Hx of depression Chronic condition. Takes citalopram at home.  -Continue home citalopram 20mg    Hx of HSV keratitis Diagnosed in 2017. Seen by Dr. Katy Conley, ophthalmology. Takes acyclovir at home.  -Continue home acyclovir 400mg  BID   DVT Prophylaxis: Can restart Xarelto today when 24 hours after surgery Diet: Carb modified,  Bowel: miralax PRN IVF: none Code: Full   Prior to Admission Living Arrangement: home w/ mother Anticipated Discharge Location: TBD Barriers to Discharge: continued  management Dispo: Anticipated discharge in approximately more than 2 day(s).   Carl Galas, MD 11/23/2022, 6:35 AM Pager: [phone] After 5pm on weekdays and 1pm on weekends: On Call pager [phone]

## 2022-11-23 NOTE — Anesthesia Postprocedure Evaluation (Signed)
Anesthesia Post Note  Patient: Carl Conley  Procedure(s) Performed: TRANSMETATARSAL AMPUTATION LEFT FOOT, TENDON ACHILLES LENGTHENING (Left: Toe)     Patient location during evaluation: PACU Anesthesia Type: General Level of consciousness: awake and alert Pain management: pain level controlled Vital Signs Assessment: post-procedure vital signs reviewed and stable Respiratory status: spontaneous breathing, nonlabored ventilation and respiratory function stable Cardiovascular status: stable and blood pressure returned to baseline Anesthetic complications: no   No notable events documented.  Last Vitals:  Vitals:   11/23/22 0504 11/23/22 0805  BP: 96/65 (!) 90/58  Pulse: 62 67  Resp: 20 18  Temp: 36.7 C 37.2 C  SpO2: 99% 100%    Last Pain:  Vitals:   11/23/22 1600  TempSrc:   PainSc: [place]

## 2022-11-23 NOTE — Progress Notes (Signed)
  Subjective:  Patient ID: PACO CISLO, male    DOB: April 20, 1986,  MRN: [id number]  POD 1 transmetatarsal amputation Achilles tendon lengthening left  Feeling okay not having pain  Negative for chest pain and shortness of breath Fever: no Night sweats: no Weight loss: no Constitutional signs: no Objective:   Vitals:   11/23/22 0504 11/23/22 0805  BP: 96/65 (!) 90/58  Pulse: 62 67  Resp: 20 18  Temp: 98.1 F (36.7 C) 98.9 F (37.2 C)  SpO2: 99% 100%   General AA&O x3. Normal mood and affect.  Vascular Both feet warm and well-perfused  Neurologic Epicritic sensation grossly absent.  Dermatologic Dressings dry and intact  Orthopedic: MMT 5/5 in dorsiflexion, plantarflexion, inversion, and eversion. Normal joint ROM without pain or crepitus.    Assessment & Plan:  Patient was evaluated and treated and all questions answered.  POD 1 transmetatarsal condition Achilles tendon lengthening left -NWB left, WBAT right -Cam walker boot on left and postop shoe on right -Final path is pending -Can discharge on Cipro 500 mg twice daily for 14 days, culture with MSSA and Pseudomonas -Follow-up with Dr. Loel Lofty as an outpatient -Will change dressing tomorrow on the left  Criselda Peaches, DPM  Accessible via secure chat for questions or concerns.

## 2022-11-23 NOTE — Progress Notes (Signed)
PHARMACY CONSULT NOTE FOR:  OUTPATIENT  PARENTERAL ANTIBIOTIC THERAPY (OPAT)  Indication: DFI with MSSA and Pseudomonas aeruginosa Regimen: Cefepime 2g IV Q8H  End date: 01/03/23 (6w from TMA)  IV antibiotic discharge orders are pended. To discharging provider:  please sign these orders via discharge navigator,  Select New Orders & click on the button choice - Manage This Unsigned Work.     Thank you for allowing pharmacy to be a part of this patient's care.  Adria Dill, PharmD PGY-2 Infectious Diseases Resident  11/23/2022 11:09 AM

## 2022-11-23 NOTE — Plan of Care (Signed)
  Problem: Education: Goal: Knowledge of General Education information will improve Description: Including pain rating scale, medication(s)/side effects and non-pharmacologic comfort measures Outcome: Progressing   Problem: Health Behavior/Discharge Planning: Goal: Ability to manage health-related needs will improve Outcome: Progressing   Problem: Clinical Measurements: Goal: Ability to maintain clinical measurements within normal limits will improve Outcome: Progressing Goal: Will remain free from infection Outcome: Progressing Goal: Diagnostic test results will improve Outcome: Progressing Goal: Respiratory complications will improve Outcome: Progressing Goal: Cardiovascular complication will be avoided Outcome: Progressing   Problem: Activity: Goal: Risk for activity intolerance will decrease Outcome: Progressing   Problem: Nutrition: Goal: Adequate nutrition will be maintained Outcome: Progressing   Problem: Coping: Goal: Level of anxiety will decrease Outcome: Progressing   Problem: Elimination: Goal: Will not experience complications related to bowel motility Outcome: Progressing Goal: Will not experience complications related to urinary retention Outcome: Progressing   Problem: Pain Managment: Goal: General experience of comfort will improve Outcome: Progressing   Problem: Safety: Goal: Ability to remain free from injury will improve Outcome: Progressing   Problem: Skin Integrity: Goal: Risk for impaired skin integrity will decrease Outcome: Progressing   Problem: Education: Goal: Knowledge of General Education information will improve Description: Including pain rating scale, medication(s)/side effects and non-pharmacologic comfort measures Outcome: Progressing   Problem: Health Behavior/Discharge Planning: Goal: Ability to manage health-related needs will improve Outcome: Progressing   Problem: Clinical Measurements: Goal: Ability to maintain  clinical measurements within normal limits will improve Outcome: Progressing Goal: Will remain free from infection Outcome: Progressing Goal: Diagnostic test results will improve Outcome: Progressing Goal: Respiratory complications will improve Outcome: Progressing Goal: Cardiovascular complication will be avoided Outcome: Progressing   Problem: Activity: Goal: Risk for activity intolerance will decrease Outcome: Progressing   Problem: Nutrition: Goal: Adequate nutrition will be maintained Outcome: Progressing   Problem: Coping: Goal: Level of anxiety will decrease Outcome: Progressing   Problem: Elimination: Goal: Will not experience complications related to bowel motility Outcome: Progressing Goal: Will not experience complications related to urinary retention Outcome: Progressing   Problem: Pain Managment: Goal: General experience of comfort will improve Outcome: Progressing   Problem: Safety: Goal: Ability to remain free from injury will improve Outcome: Progressing   Problem: Skin Integrity: Goal: Risk for impaired skin integrity will decrease Outcome: Progressing   Problem: Education: Goal: Ability to describe self-care measures that may prevent or decrease complications (Diabetes Survival Skills Education) will improve Outcome: Progressing Goal: Individualized Educational Video(s) Outcome: Progressing   Problem: Coping: Goal: Ability to adjust to condition or change in health will improve Outcome: Progressing   Problem: Fluid Volume: Goal: Ability to maintain a balanced intake and output will improve Outcome: Progressing   Problem: Health Behavior/Discharge Planning: Goal: Ability to identify and utilize available resources and services will improve Outcome: Progressing Goal: Ability to manage health-related needs will improve Outcome: Progressing   Problem: Metabolic: Goal: Ability to maintain appropriate glucose levels will improve Outcome:  Progressing   Problem: Nutritional: Goal: Maintenance of adequate nutrition will improve Outcome: Progressing Goal: Progress toward achieving an optimal weight will improve Outcome: Progressing   Problem: Skin Integrity: Goal: Risk for impaired skin integrity will decrease Outcome: Progressing   Problem: Tissue Perfusion: Goal: Adequacy of tissue perfusion will improve Outcome: Progressing

## 2022-11-23 NOTE — Progress Notes (Signed)
PT Cancellation Note  Patient Details Name: Carl Conley MRN: [id number] DOB: [birth year]   Cancelled Treatment:    Reason Eval/Treat Not Completed: Other (comment); patient currently with OT.  Will attempt later.    Reginia Naas 11/23/2022, 9:46 AM Magda Kiel, PT Acute Rehabilitation Services Office:[phone] 11/23/2022

## 2022-11-23 NOTE — Plan of Care (Signed)
  Problem: Education: Goal: Knowledge of General Education information will improve Description: Including pain rating scale, medication(s)/side effects and non-pharmacologic comfort measures Outcome: Progressing   Problem: Nutrition: Goal: Adequate nutrition will be maintained Outcome: Progressing   Problem: Coping: Goal: Level of anxiety will decrease Outcome: Progressing   

## 2022-11-23 NOTE — Progress Notes (Signed)
  Progress Note    11/23/2022 7:35 AM 1 Day Post-Op  Subjective:  no complaints   Vitals:   11/22/22 1928 11/23/22 0504  BP: 91/61 96/65  Pulse: 83 62  Resp: 18 20  Temp: 98.2 F (36.8 C) 98.1 F (36.7 C)  SpO2: 98% 99%   Physical Exam: Cardiac:  regular Lungs:  non labored Extremities:  Bilateral feet dressed Neurologic: alert and oriented  CBC    Component Value Date/Time   WBC 7.5 11/23/2022 0425   RBC 3.86 (L) 11/23/2022 0425   HGB 10.0 (L) 11/23/2022 0425   HGB 16.8 02/04/2020 1406   HCT 32.2 (L) 11/23/2022 0425   HCT 47.6 02/04/2020 1406   PLT 210 11/23/2022 0425   PLT 255 02/04/2020 1406   MCV 83.4 11/23/2022 0425   MCV 83 02/04/2020 1406   MCH 25.9 (L) 11/23/2022 0425   MCHC 31.1 11/23/2022 0425   RDW 16.0 (H) 11/23/2022 0425   RDW 12.8 02/04/2020 1406   LYMPHSABS 2.3 11/20/2022 0354   MONOABS 0.8 11/20/2022 0354   EOSABS 0.5 11/20/2022 0354   BASOSABS 0.0 11/20/2022 0354    BMET    Component Value Date/Time   NA 135 11/23/2022 0425   NA 138 07/17/2021 1408   K 4.1 11/23/2022 0425   CL 105 11/23/2022 0425   CO2 22 11/23/2022 0425   GLUCOSE 140 (H) 11/23/2022 0425   BUN 15 11/23/2022 0425   BUN 16 07/17/2021 1408   CREATININE 1.09 11/23/2022 0425   CREATININE 0.78 02/12/2012 2053   CALCIUM 8.9 11/23/2022 0425   GFRNONAA >60 11/23/2022 0425   GFRAA 112 02/04/2020 1406    INR No results found for: "INR"   Intake/Output Summary (Last 24 hours) at 11/23/2022 0735 Last data filed at 11/23/2022 0503 Gross per 24 hour  Intake 800 ml  Output 2070 ml  Net -1270 ml     Assessment/Plan:  37 y.o. male with acute osteomyelitis of bilateral feet being managed by Podiatry. Non compressible ABIs. Per Dr. Loel Lofty notes good bleeding was observed in OR. If there are any continued concerns about adequate perfusion and wound healing would warrant angiography. Vascular surgery will follow peripherally   Karoline Caldwell, PA-C [facility] [phone] 11/23/2022 7:35 AM

## 2022-11-23 NOTE — TOC Progression Note (Signed)
Transition of Care [facility]) - Progression Note    Patient Details  Name: Carl Conley MRN: [id number] Date of Birth: 1986-06-10  Transition of Care [facility] CM/SW Contact  Sharin Mons, RN Phone Number: 11/23/2022, 2:18 PM  Clinical Narrative:    Per ID pt will require LT IV ABX therapy. OPAT orders noted. NCM spoke with pt regarding home infusion therapy. Pt states agreeable to services. States mom can assist with care. Pt without insurance, medicaid pending referral made with [facility] with [facility] for [facility] services under charity care, acceptance pending. Referral made with Pam with [facility] for IV ABX therapy. Home infusion teaching will be provided by [facility] prior to d/c.  PICC line pending....   TOC team following and will assist with TOC needs.  Expected Discharge Plan: [place] Barriers to Discharge: Continued Medical Work up  Expected Discharge Plan and Services In-house Referral: Clinical Social Work Discharge Planning Services: CM Consult Post Acute Care Choice: NA Living arrangements for the past 2 months: Single Family Home                 DME Arranged: Other see comment (iv abx therapy) DME Agency: Other - Comment ([facility])       HH Arranged: RN [place] Agency: [facility]) Date [place]: 11/23/22 Time [place]: [place] Representative spoke with at [place]: [place] Determinants of Health ([place]) Interventions SDOH Screenings   Food Insecurity: No Food Insecurity (11/17/2022)  Housing: Low Risk  (11/17/2022)  Transportation Needs: No Transportation Needs (11/17/2022)  Utilities: Not At Risk (11/17/2022)  Depression (PHQ2-9): Low Risk  (07/17/2021)  Tobacco Use: Low Risk  (11/22/2022)    Readmission Risk Interventions     No data to display

## 2022-11-23 NOTE — Evaluation (Signed)
Physical Therapy Evaluation Patient Details Name: Carl Conley MRN: [id number] DOB: Feb 27, 1986 Today's Date: 11/23/2022  History of Present Illness  37 y/o male presents with B foot wounds 11/17/21 diagnosed with osteomyelitis B feet with MRI .  Underwent S/P right second toe amputation, debridement and bone biopsy of the later malleolus wound right  LE.   Left foot also underwent initial partial 2nd ray amputation, 3rd metatarsal head bone biopsy, and 1st metatarsal head bone biopsy.  Transmetatarsal amputation of the left foot completed 1/18.  PMH DM visual snow syndrome, corneal ulcer of the right eye.  Clinical Impression  Patient presents with decreased mobility due to generalized weakness, decreased balance now NWB on L LE wearing camboot and likely high fall risk.  Currently able to transfer with minguard A for higher surface to simulate his bed height (though he likely cannot access upper level of his home due to second floor bedroom); and ambulated in the room NWB on L LE with RW and minguard to S level assistance.  His mother is unreachable by phone as she has no phone and patient is not sure if his walker at home has legs.  He has R foot second toe amputation and lateral malleolus and is now hopping on that foot alone with a post-op shoe which he cannot clear from the ground well.  He will have to learn how to negotiate 3 steps to enter his home and steps have no rails.  Concern for home right away as he has limited resources (not sure he will be accepted for charity [facility] care) and not sure how much his mother can assist.  Feel he would benefit from short stay on acute inpatient rehab prior to d/c home to ensure safe stair negotiation and to allow more practice managing with RW and keeping weight off his L foot.  PT will continue to follow and address stairs in case he discharges home anyway.        Recommendations for follow up therapy are one component of a multi-disciplinary discharge  planning process, led by the attending physician.  Recommendations may be updated based on patient status, additional functional criteria and insurance authorization.  Follow Up Recommendations Acute inpatient rehab (3hours/day)      Assistance Recommended at Discharge Intermittent Supervision/Assistance  Patient can return home with the following  A little help with walking and/or transfers;Help with stairs or ramp for entrance;Assist for transportation;A little help with bathing/dressing/bathroom    Equipment Recommendations Rolling walker (2 wheels);Wheelchair (measurements PT);Wheelchair cushion (measurements PT)  Recommendations for Other Services       Functional Status Assessment Patient has had a recent decline in their functional status and demonstrates the ability to make significant improvements in function in a reasonable and predictable amount of time.     Precautions / Restrictions Precautions Precautions: Fall Required Braces or Orthoses: Other Brace Other Brace: cam boot LLE and post op shoe on the right Restrictions RLE Weight Bearing: Weight bearing as tolerated LLE Weight Bearing: Non weight bearing      Mobility  Bed Mobility Overal bed mobility: Modified Independent                  Transfers Overall transfer level: Needs assistance Equipment used: Rolling walker (2 wheels) Transfers: Sit to/from Stand Sit to Stand: From elevated surface, Min guard           General transfer comment: simulated bed height at home; remembered what OT had said for hand placement  Ambulation/Gait Ambulation/Gait assistance: Min guard, Supervision Gait Distance (Feet): 40 Feet Assistive device: Rolling walker (2 wheels) Gait Pattern/deviations: Step-to pattern       General Gait Details: NWB on L LE throughout and cues for increased step height (foot clearance) for R foot with hopping steps  Stairs            Wheelchair Mobility    Modified  [place] (Stroke Patients Only)       Balance Overall balance assessment: Needs assistance Sitting-balance support: Feet supported Sitting balance-Leahy Scale: Good     Standing balance support: Bilateral upper extremity supported Standing balance-Leahy Scale: Poor Standing balance comment: Pt needs UE support on the RW in standing.                             Pertinent Vitals/Pain Pain Assessment Pain Score: 0-No pain    Home Living Family/patient expects to be discharged to:: Private residence Living Arrangements: Parent (lives with mother) Available Help at Discharge: Family;Available 24 hours/day Type of Home: House Home Access: Stairs to enter   Entrance Stairs-Number of Steps: 3 Alternate Level Stairs-Number of Steps: 12 Home Layout: Multi-level;1/2 bath on main level;Bed/bath upstairs Home Equipment: Conservation officer, nature (2 wheels) Additional Comments: has RW, but not sure where the legs are; can't call his mother since she does not have access to a phone right now    Prior Function Prior Level of Function : Independent/Modified Independent                     Hand Dominance   Dominant Hand: Right    Extremity/Trunk Assessment   Upper Extremity Assessment Upper Extremity Assessment: Defer to OT evaluation    Lower Extremity Assessment Lower Extremity Assessment: LLE deficits/detail;RLE deficits/detail RLE Deficits / Details: toes wraped AROM and strength WFL, but absent sensation below ankle RLE Sensation: decreased proprioception;decreased light touch LLE Deficits / Details: transmet amputation with Achilles lengthening; able to move ankle with limited ROM; strength hip and knee WFL LLE Sensation: decreased light touch;decreased proprioception    Cervical / Trunk Assessment Cervical / Trunk Assessment: Normal  Communication   Communication: No difficulties  Cognition Arousal/Alertness: Awake/alert Behavior During Therapy: WFL for tasks  assessed/performed Overall Cognitive Status: History of cognitive impairments - at baseline                                 General Comments: Pt with history of ADHD but oriented to place, time, and situation.  Able to recall weightbearing status on the LLE but difficulty following it with sit to stand.  Some decreased awareness as he was initially not sure why he could not walk on the L with a heel weight bearing shoe till I reminded him he had an Achilles lengthening as well as the amputation.        General Comments General comments (skin integrity, edema, etc.): Educated in doffing boot when in supine to allow for ankle AROM for circulation    Exercises General Exercises - Upper Extremity Shoulder Flexion: AROM, 10 reps, Supine, Both   Assessment/Plan    PT Assessment Patient needs continued PT services  PT Problem List Decreased balance;Decreased knowledge of precautions;Decreased mobility;Decreased safety awareness;Impaired sensation;Decreased knowledge of use of DME       PT Treatment Interventions DME instruction;Functional mobility training;Balance training;Patient/family education;Gait training;Therapeutic activities;Wheelchair mobility training;Therapeutic exercise  PT Goals (Current goals can be found in the Care Plan section)  Acute Rehab PT Goals Patient Stated Goal: to return home PT Goal Formulation: With patient Time For Goal Achievement: 12/07/22 Potential to Achieve Goals: Good    Frequency Min 3X/week     Co-evaluation               AM-PAC PT "6 Clicks" Mobility  Outcome Measure Help needed turning from your back to your side while in a flat bed without using bedrails?: None Help needed moving from lying on your back to sitting on the side of a flat bed without using bedrails?: None Help needed moving to and from a bed to a chair (including a wheelchair)?: A Little Help needed standing up from a chair using your arms (e.g.,  wheelchair or bedside chair)?: A Little Help needed to walk in hospital room?: A Little Help needed climbing 3-5 steps with a railing? : Total 6 Click Score: 18    End of Session Equipment Utilized During Treatment: Gait belt Activity Tolerance: Patient tolerated treatment well Patient left: in bed;with call bell/phone within reach;with bed alarm set   PT Visit Diagnosis: Other abnormalities of gait and mobility (R26.89);Difficulty in walking, not elsewhere classified (R26.2)    Time: 4580-9983 PT Time Calculation (min) (ACUTE ONLY): 34 min   Charges:   PT Evaluation $PT Eval Moderate Complexity: 1 Mod PT Treatments $Gait Training: 8-22 mins        Magda Kiel, PT Acute Rehabilitation Services [phone] 11/23/2022   Reginia Naas 11/23/2022, 4:44 PM

## 2022-11-23 NOTE — Progress Notes (Signed)
[facility]  Date of Admission:  11/17/2022     Total days of antibiotics 7         ASSESSMENT:  Carl Conley is POD #1 from left foot TMA. Circulation during surgery appeared adequate with no vascular intervention at this time. Discussed plan of 6 weeks of Cefepime for Pseudomonas infection. Place PICC line for home administration of antibiotics. OPAT/Home Health orders below. Continue wound care per Podiatry recommendations. Will arrange follow up in ID office. Remaining medical and supportive care per primary team.   PLAN:  Continue current dose of cefepime.  Place PICC.  Continue wound care per Podiatry recommendations.  OPAT/Home Health orders Follow up ID office.  Remaining medical and supportive care per Primary Team.   Diagnosis: Osteomyelitis   Culture Result: Pseudomonas  No Known Allergies  OPAT Orders Discharge antibiotics to be given via PICC line Discharge antibiotics: Cefepime  Per pharmacy protocol   Duration: 6 weeks End Date: 01/03/23  [facility] Care Per Protocol:  Home health RN for IV administration and teaching; PICC line care and labs.    Labs weekly while on IV antibiotics: _X_ CBC with differential _X_ BMP __ CMP _X_ CRP _X_ ESR __ Vancomycin trough __ CK  _X_ Please pull PIC at completion of IV antibiotics __ Please leave PIC in place until doctor has seen patient or been notified  Fax weekly labs to [phone]  Clinic Follow Up Appt:  12/18/22 at 10:30 am with Dr. Renold Don    Principal Problem:   Diabetic osteomyelitis [facility]) Active Problems:   Osteomyelitis of left foot (HCC)    acetaminophen  1,000 mg Oral TID   Or   acetaminophen  650 mg Rectal TID   acyclovir  400 mg Oral BID   citalopram  20 mg Oral Daily   insulin aspart  0-15 Units Subcutaneous TID WC   insulin aspart  0-5 Units Subcutaneous QHS   rivaroxaban  10 mg Oral QHS    SUBJECTIVE:  Afebrile overnight with no acute events.  Wanting to get back into the bed.   No Known Allergies   Review of Systems: Review of Systems  Constitutional:  Negative for chills, fever and weight loss.  Respiratory:  Negative for cough, shortness of breath and wheezing.   Cardiovascular:  Negative for chest pain and leg swelling.  Gastrointestinal:  Negative for abdominal pain, constipation, diarrhea, nausea and vomiting.  Skin:  Negative for rash.      OBJECTIVE: Vitals:   11/22/22 1805 11/22/22 1928 11/23/22 0504 11/23/22 0805  BP: 103/73 91/61 96/65  (!) 90/58  Pulse: 79 83 62 67  Resp: 16 18 20 18   Temp: 97.7 F (36.5 C) 98.2 F (36.8 C) 98.1 F (36.7 C) 98.9 F (37.2 C)  TempSrc: Oral Oral Oral Oral  SpO2: 99% 98% 99% 100%  Weight:      Height:       Body mass index is 31.06 kg/m.  Physical Exam Constitutional:      General: He is not in acute distress.    Appearance: He is well-developed.     Comments: Seated in the chair next to the bed; pleasant.   Cardiovascular:     Rate and Rhythm: Normal rate and regular rhythm.     Heart sounds: Normal heart sounds.  Pulmonary:     Effort: Pulmonary effort is normal.     Breath sounds: Normal breath sounds.  Musculoskeletal:     Comments: Cam walker  on left foot.   Skin:    General: Skin is warm and dry.  Neurological:     Mental Status: He is alert and oriented to person, place, and time.  Psychiatric:        Behavior: Behavior normal.        Thought Content: Thought content normal.        Judgment: Judgment normal.     Lab Results Lab Results  Component Value Date   WBC 7.5 11/23/2022   HGB 10.0 (L) 11/23/2022   HCT 32.2 (L) 11/23/2022   MCV 83.4 11/23/2022   PLT 210 11/23/2022    Lab Results  Component Value Date   CREATININE 1.09 11/23/2022   BUN 15 11/23/2022   NA 135 11/23/2022   K 4.1 11/23/2022   CL 105 11/23/2022   CO2 22 11/23/2022    Lab Results  Component Value Date   ALT 25 11/17/2022   AST 32 11/17/2022   ALKPHOS 71  11/17/2022   BILITOT 0.4 11/17/2022     Microbiology: Recent Results (from the past 240 hour(s))  Aerobic/Anaerobic Culture w Gram Stain (surgical/deep wound)     Status: None (Preliminary result)   Collection Time: 11/19/22  8:24 AM   Specimen: Bone; Tissue  Result Value Ref Range Status   Specimen Description BONE  Final   Special Requests  RIGHT FIBULA  Final   Gram Stain   Final    NO ORGANISMS SEEN NO WBC SEEN Performed at [facility], [place] [address] [place], [place] [address]    Culture   Final    RARE PSEUDOMONAS AERUGINOSA SUSCEPTIBILITIES PERFORMED ON PREVIOUS CULTURE WITHIN THE LAST 5 DAYS. NO ANAEROBES ISOLATED; CULTURE IN PROGRESS FOR 5 DAYS    Report Status PENDING  Incomplete  Aerobic/Anaerobic Culture w Gram Stain (surgical/deep wound)     Status: None (Preliminary result)   Collection Time: 11/19/22  8:26 AM   Specimen: Wound  Result Value Ref Range Status   Specimen Description WOUND  Final   Special Requests  MPJ RIGHT  Final   Gram Stain NO WBC SEEN NO ORGANISMS SEEN   Final   Culture   Final    NO GROWTH 4 DAYS NO ANAEROBES ISOLATED; CULTURE IN PROGRESS FOR 5 DAYS Performed at [facility], [address] [place], [place] [address]    Report Status PENDING  Incomplete  Aerobic/Anaerobic Culture w Gram Stain (surgical/deep wound)     Status: None (Preliminary result)   Collection Time: 11/19/22  8:38 AM   Specimen: Bone; Tissue  Result Value Ref Range Status   Specimen Description BONE  Final   Special Requests  LEFT METATARSAL  Final   Gram Stain   Final    FEW WBC PRESENT,BOTH PMN AND MONONUCLEAR NO ORGANISMS SEEN Performed at [facility], [address], [place],  [address]    Culture   Final    RARE STAPHYLOCOCCUS AUREUS NO ANAEROBES ISOLATED; CULTURE IN PROGRESS FOR 5 DAYS    Report Status PENDING  Incomplete   Organism ID, Bacteria STAPHYLOCOCCUS AUREUS  Final      Susceptibility   Staphylococcus aureus -  MIC*    CIPROFLOXACIN <=0.5 SENSITIVE Sensitive     ERYTHROMYCIN <=0.25 SENSITIVE Sensitive     GENTAMICIN <=0.5 SENSITIVE Sensitive     OXACILLIN 0.5 SENSITIVE Sensitive     TETRACYCLINE <=1 SENSITIVE Sensitive     VANCOMYCIN <=0.5 SENSITIVE Sensitive     TRIMETH/SULFA <=  10 SENSITIVE Sensitive     CLINDAMYCIN <=0.25 SENSITIVE Sensitive     RIFAMPIN <=0.5 SENSITIVE Sensitive     Inducible Clindamycin NEGATIVE Sensitive     * RARE STAPHYLOCOCCUS AUREUS  Aerobic/Anaerobic Culture w Gram Stain (surgical/deep wound)     Status: None (Preliminary result)   Collection Time: 11/19/22  8:51 AM   Specimen: Wound  Result Value Ref Range Status   Specimen Description WOUND  Final   Special Requests  FIRST MPJ LEFT FOOT  Final   Gram Stain   Final    RARE WBC PRESENT, PREDOMINANTLY PMN NO ORGANISMS SEEN Performed at [facility], [address], [place], [place] [address]    Culture   Final    RARE PSEUDOMONAS AERUGINOSA RARE STAPHYLOCOCCUS AUREUS SUSCEPTIBILITIES PERFORMED ON PREVIOUS CULTURE WITHIN THE LAST 5 DAYS. NO ANAEROBES ISOLATED; CULTURE IN PROGRESS FOR 5 DAYS    Report Status PENDING  Incomplete   Organism ID, Bacteria PSEUDOMONAS AERUGINOSA  Final      Susceptibility   Pseudomonas aeruginosa - MIC*    CEFTAZIDIME 4 SENSITIVE Sensitive     CIPROFLOXACIN <=0.25 SENSITIVE Sensitive     GENTAMICIN <=1 SENSITIVE Sensitive     IMIPENEM 1 SENSITIVE Sensitive     PIP/TAZO 8 SENSITIVE Sensitive     CEFEPIME 2 SENSITIVE Sensitive     * RARE PSEUDOMONAS AERUGINOSA  Aerobic/Anaerobic Culture w Gram Stain (surgical/deep wound)     Status: None (Preliminary result)   Collection Time: 11/19/22  9:00 AM   Specimen: Wound  Result Value Ref Range Status   Specimen Description WOUND  Final   Special Requests  SECOND MPJ LEFT FOOT  Final   Gram Stain   Final    NO WBC SEEN NO ORGANISMS SEEN Performed at [facility], [place] [address] [place], [place] [address]    Culture    Final    RARE STAPHYLOCOCCUS AUREUS SUSCEPTIBILITIES PERFORMED ON PREVIOUS CULTURE WITHIN THE LAST 5 DAYS. NO ANAEROBES ISOLATED; CULTURE IN PROGRESS FOR 5 DAYS    Report Status PENDING  Incomplete     Terri Piedra, NP [facility]  11/23/2022  1:21 PM

## 2022-11-23 NOTE — Evaluation (Signed)
Occupational Therapy Evaluation Patient Details Name: Carl Conley MRN: [id number] DOB: 21-May-1986 Today's Date: 11/23/2022   History of Present Illness 37 y/o male presents with B foot wounds 11/17/21 diagnosed with osteomyelitis B feet with MRI .  Underwent S/P right second toe amputation, debridement and bone biopsy of the later malleolus wound right  LE.   Left foot also underwent initial partial 2nd ray amputation, 3rd metatarsal head bone biopsy, and 1st metatarsal head bone biopsy.  Transmetatarsal amputation of the left foot completed 1/18.  PMH DM visual snow syndrome, corneal ulcer of the right eye.   Clinical Impression   Pt currently min to mod assist for selfcare tasks and toilet transfers.  Slight difficulty maintaining NWBing on the LLE for sit to stand transitions.  Feel he will benefit from acute care OT at this time to increase ADL and transfer independence and safety maintaining NWBing restrictions.  Will need at least min assist level from his mom at home which he states she can provide in order to discharge.  If she cannot provide this recommend possible AIR for short duration to progress to supervision level.       Recommendations for follow up therapy are one component of a multi-disciplinary discharge planning process, led by the attending physician.  Recommendations may be updated based on patient status, additional functional criteria and insurance authorization.   Follow Up Recommendations  Home health OT     Assistance Recommended at Discharge Frequent or constant Supervision/Assistance  Patient can return home with the following A little help with walking and/or transfers;A little help with bathing/dressing/bathroom;Assistance with cooking/housework;Assist for transportation;Help with stairs or ramp for entrance    Functional Status Assessment  Patient has had a recent decline in their functional status and demonstrates the ability to make significant  improvements in function in a reasonable and predictable amount of time.  Equipment Recommendations  BSC/3in1       Precautions / Restrictions Precautions Precautions: Fall Required Braces or Orthoses: Other Brace Other Brace: cam boot LLE and post op shoe on the right Restrictions Weight Bearing Restrictions: Yes RLE Weight Bearing: Weight bearing as tolerated LLE Weight Bearing: Non weight bearing      Mobility Bed Mobility Overal bed mobility: Needs Assistance Bed Mobility: Supine to Sit     Supine to sit: Supervision          Transfers Overall transfer level: Needs assistance Equipment used: Rolling walker (2 wheels) Transfers: Sit to/from Stand, Bed to chair/wheelchair/BSC Sit to Stand: Mod assist (from lowered toilet with rail)     Step pivot transfers: Mod assist, Min assist     General transfer comment: Mod demonstrational cueing for sequencing sit to stand for hand placement and for non-weightbearing status on the LLE.      Balance Overall balance assessment: Needs assistance Sitting-balance support: Feet supported, Single extremity supported Sitting balance-Leahy Scale: Fair Sitting balance - Comments: Able to maintain balance with selfcare tasks EOB     Standing balance-Leahy Scale: Poor Standing balance comment: Pt needs UE support on the RW in standing.                           ADL either performed or assessed with clinical judgement   ADL Overall ADL's : Needs assistance/impaired Eating/Feeding: Independent;Sitting   Grooming: Wash/dry hands;Wash/dry face;Sitting;Set up   Upper Body Bathing: Supervision/ safety;Sitting   Lower Body Bathing: Minimal assistance   Upper Body Dressing : Supervision/safety;Sitting  Lower Body Dressing: Minimal assistance;Sit to/from stand   Toilet Transfer: Moderate assistance;Ambulation;Rolling walker (2 wheels)   Toileting- Clothing Manipulation and Hygiene: Moderate assistance;Sit to/from  stand Toileting - Clothing Manipulation Details (indicate cue type and reason): simulated`     Functional mobility during ADLs: Minimal assistance;Rolling walker (2 wheels) General ADL Comments: Pt with difficulty maintaining NWBing with sit to stand.  Min assist to complete standing from bed with mod assist from lowered toilet with rail.  Will benefit from 3:1 for home and 24 hr supervsion from his mom for transfers secondary to decreased balance, sensory impairment and amputations.     Vision Baseline Vision/History: 1 Wears glasses (blind in left eye, visual snow disorder makes it hard to see details per his report.) Ability to See in Adequate Light: 2 Moderately impaired Patient Visual Report: No change from baseline Vision Assessment?: No apparent visual deficits (no deficits beyond baseline)     Perception  NT   Praxis  NT    Pertinent Vitals/Pain Pain Assessment Pain Assessment: 0-10 Pain Score: 0-No pain     Hand Dominance Right   Extremity/Trunk Assessment Upper Extremity Assessment Upper Extremity Assessment: Overall WFL for tasks assessed   Lower Extremity Assessment Lower Extremity Assessment: Defer to PT evaluation   Cervical / Trunk Assessment Cervical / Trunk Assessment: Normal   Communication Communication Communication: No difficulties   Cognition Arousal/Alertness: Awake/alert Behavior During Therapy: WFL for tasks assessed/performed Overall Cognitive Status: History of cognitive impairments - at baseline                                 General Comments: Pt with history of ADHD but oriented to place, time, and situation.  Able to recall weightbearing status on the LLE but difficulty following it with sit to stand.  Decreased awareness as he inquired if he could get some crutches so he could get up and walk.  Re-directed that RW would be safest at this time.                Home Living   Living Arrangements: Parent (lives with  mom) Available Help at Discharge: Family;Available 24 hours/day Type of Home: House Home Access: Stairs to enter [employer] of Steps: 3   Home Layout: Multi-level;1/2 bath on main level;Bed/bath upstairs Alternate Level Stairs-Number of Steps: 12 Alternate Level Stairs-Rails: Right;Left (left side rail ends half way up the steps) Bathroom Shower/Tub: Chief Strategy Officer: Standard     Home Equipment: Agricultural consultant (2 wheels)          Prior Functioning/Environment Prior Level of Function : Independent/Modified Independent                        OT Problem List: Impaired balance (sitting and/or standing);Decreased safety awareness;Decreased knowledge of precautions;Decreased knowledge of use of DME or AE;Pain      OT Treatment/Interventions: Self-care/ADL training;Patient/family education;Balance training;Therapeutic activities;DME and/or AE instruction;Cognitive remediation/compensation    OT Goals(Current goals can be found in the care plan section) Acute Rehab OT Goals Patient Stated Goal: Pt wants to get back to being as independent as possible. OT Goal Formulation: With patient Time For Goal Achievement: 12/07/22 Potential to Achieve Goals: Good  OT Frequency: Min 2X/week       AM-PAC OT "6 Clicks" Daily Activity     Outcome Measure Help from another person eating meals?: None Help from another  person taking care of personal grooming?: A Little Help from another person toileting, which includes using toliet, bedpan, or urinal?: A Lot Help from another person bathing (including washing, rinsing, drying)?: A Little Help from another person to put on and taking off regular upper body clothing?: None Help from another person to put on and taking off regular lower body clothing?: A Little 6 Click Score: 19   End of Session Equipment Utilized During Treatment: Gait belt Nurse Communication: Mobility status  Activity Tolerance: Patient  tolerated treatment well Patient left: in chair;with call bell/phone within reach;with chair alarm set  OT Visit Diagnosis: Unsteadiness on feet (R26.81);Muscle weakness (generalized) (M62.81);Other abnormalities of gait and mobility (R26.89)                Time: 0905-1004 OT Time Calculation (min): 59 min Charges:  OT General Charges $OT Visit: 1 Visit OT Evaluation $OT Eval Moderate Complexity: 1 Mod OT Treatments $Self Care/Home Management : 38-52 mins  Jesyca Weisenburger OTR/L 11/23/2022, 10:31 AM

## 2022-11-24 LAB — GLUCOSE, CAPILLARY
Glucose-Capillary: 164 mg/dL — ABNORMAL HIGH (ref 70–99)
Glucose-Capillary: 153 mg/dL — ABNORMAL HIGH (ref 70–99)
Glucose-Capillary: 167 mg/dL — ABNORMAL HIGH (ref 70–99)
Glucose-Capillary: 145 mg/dL — ABNORMAL HIGH (ref 70–99)

## 2022-11-24 LAB — AEROBIC/ANAEROBIC CULTURE W GRAM STAIN (SURGICAL/DEEP WOUND)
Culture: NO GROWTH
Gram Stain: NONE SEEN
Gram Stain: NONE SEEN
Gram Stain: NONE SEEN

## 2022-11-24 LAB — CBC
HCT: 30.4 % — ABNORMAL LOW (ref 39.0–52.0)
Hemoglobin: 10.1 g/dL — ABNORMAL LOW (ref 13.0–17.0)
MCH: 26.9 pg (ref 26.0–34.0)
MCHC: 33.2 g/dL (ref 30.0–36.0)
MCV: 80.9 fL (ref 80.0–100.0)
Platelets: 214 10*3/uL (ref 150–400)
RBC: 3.76 MIL/uL — ABNORMAL LOW (ref 4.22–5.81)
RDW: 15.6 % — ABNORMAL HIGH (ref 11.5–15.5)
WBC: 8.1 10*3/uL (ref 4.0–10.5)
nRBC: 0 % (ref 0.0–0.2)

## 2022-11-24 LAB — BASIC METABOLIC PANEL
Anion gap: 10 (ref 5–15)
BUN: 18 mg/dL (ref 6–20)
CO2: 21 mmol/L — ABNORMAL LOW (ref 22–32)
Calcium: 8.9 mg/dL (ref 8.9–10.3)
Chloride: 104 mmol/L (ref 98–111)
Creatinine, Ser: 0.94 mg/dL (ref 0.61–1.24)
GFR, Estimated: >60 mL/min (ref >60)
Glucose, Bld: 163 mg/dL — ABNORMAL HIGH (ref 70–99)
Potassium: 3.8 mmol/L (ref 3.5–5.1)
Sodium: 135 mmol/L (ref 135–145)

## 2022-11-24 MED ORDER — CHLORHEXIDINE GLUCONATE CLOTH 2 % EX PADS
6.0000 | MEDICATED_PAD | Freq: Every day | CUTANEOUS | Status: DC
  Administered 2022-11-25 – 2022-12-03 (×8): 6 via TOPICAL

## 2022-11-24 MED ORDER — SODIUM CHLORIDE 0.9% FLUSH: 10.0000 mL | INTRAVENOUS | Status: DC | PRN

## 2022-11-24 NOTE — Plan of Care (Signed)
  Problem: Activity: Goal: Risk for activity intolerance will decrease Outcome: Progressing   Problem: Nutrition: Goal: Adequate nutrition will be maintained Outcome: Progressing   Problem: Pain Managment: Goal: General experience of comfort will improve Outcome: Progressing   Problem: Education: Goal: Knowledge of General Education information will improve Description: Including pain rating scale, medication(s)/side effects and non-pharmacologic comfort measures Outcome: Progressing   Problem: Activity: Goal: Risk for activity intolerance will decrease Outcome: Progressing

## 2022-11-24 NOTE — Treatment Plan (Signed)
Attempted to see patient earlier today, was bathing, nurse had just changed dressing. Will see again tomorrow. Continue NWB LLE and WBAT RLE

## 2022-11-24 NOTE — Progress Notes (Signed)
Inpatient Rehab Admissions Coordinator:   Per therapy recommendations,  patient was screened for CIR candidacy by Clemens Catholic, MS, CCC-SLP . At this time, Pt. Is already min guard-supervision with transfers and functional mobility. CIR will not  have beds until early-mid next week, and I suspect Pt. Will be able to progress to discharge directly home by then. I will follow and rescreen Monday. Please contact me any with questions.  Clemens Catholic, [place], [place] Admissions Coordinator  [phone] ([place]) [phone] (office)

## 2022-11-24 NOTE — Progress Notes (Signed)
HD#7 SUBJECTIVE:  Patient Summary: Carl Conley is a 37 y.o. with a pertinent PMH of T2DM, NAFLD, ADHD, and depression, who presented with bilateral foot wounds and admitted for bilateral osteomyelitis.   Overnight Events: No acute events overnight  Interim History: The patient was evaluated today, working with occupational therapy and doing well with what. Pain is well controlled. Eating and drinking well. Last BM yesterday. Discussed recommendation for CIR. With him caring for his mother at home, he is unsure about going to CIR. He will think about it today and interested in talking with CIR coordinator.   OBJECTIVE:  Vital Signs: Vitals:   11/23/22 0504 11/23/22 0805 11/23/22 1918 11/24/22 0502  BP: 96/65 (!) 90/58 101/73 109/72  Pulse: 62 67 81 68  Resp: 20 18 19 17   Temp: 98.1 F (36.7 C) 98.9 F (37.2 C) 98.1 F (36.7 C) 98 F (36.7 C)  TempSrc: Oral Oral Oral Oral  SpO2: 99% 100% 99% 98%  Weight:      Height:       Supplemental O2: Room Air SpO2: 98 % O2 Flow Rate (L/min): 10 L/min  Filed Weights   11/22/22 1437  Weight: 101 kg     Intake/Output Summary (Last 24 hours) at 11/24/2022 0633 Last data filed at 11/24/2022 0507 Gross per 24 hour  Intake 720 ml  Output 2400 ml  Net -1680 ml   Net IO Since Admission: -8,099.18 mL [11/24/22 0633]  Physical Exam: General: Awake, alert, in no acute distress. CV: RRR. No murmurs, rubs, or gallops.  Pulmonary: Normal work of breathing on room air Extremities: Bilateral feet covered in dressings. S/p amputation of R 2nd toe at MTP and s/p L TMA.  Skin: Warm and dry. No obvious rash or lesions. Neuro: A&Ox3. No focal deficit. Psych: Normal mood and affect    ASSESSMENT/PLAN:  Assessment: Principal Problem:   Diabetic osteomyelitis (HCC) Active Problems:   Osteomyelitis of left foot (HCC)   Plan: Bilateral Feet Osteomyelitis 2/2 to diabetic neuropathy Status post bilateral partial lower extremity  amputations Presented w/ worsening bilateral foot wounds over the last 6 months. Previously seen in clinic, started on augmentin/doxycycline w/o resolution. Feet XRAY from 1/11 demonstrate bony destruction of the second toes bilaterally, and bony irregularity and periosteal reaction of the left metatarsal shaft consistent with osteomyelitis. CRP elevated at 1.1. ESR elevated at 49. MRI of L/R foot on admission with osteo.  The patient is now s\p amputation of R 2nd toe at MTP joint, R distal fibula biopsy, debridement of R ankle, and most recently L TMA. Cultures thus far are growing MSSA and pseudomonas. R distal fibula bx also showing osteo, discussed with vascular and podiatry and they recommended attempting conservative management with abx and followup surgery only if necessary.  ID recommending outpatient IV cefepime.  Needs PICC line placed and ID follow-up after discharge. - appreciate podiatry, vascular, ID recs - plan for f/u ABI in 1 year with vascular surgery - Continue IV Cefepime per pharmacy.  Needs PICC line placement and ID follow-up outpatient for long term antibiotics  - Trend CBC - Scheduled Tylenol 1000 3 times daily, oxycodone 10 mg prn every 8 for severe pain - PT/OT as able; recommending CIR but no beds available to may be able to d/c home early next week if he progresses well  #Type 2 diabetes A1c 6.5% from December 2023.  On Invokamet [phone] mg twice daily at home.  CBGs well-managed.. - SSI - CBG goal <180  #  Depression - Continue citalopram 20 mg  #Hx of HSV keratitis -Continue home acyclovir 400 mg twice daily  Best Practice: Diet: Diabetic diet IVF: Fluids: none VTE: rivaroxaban (XARELTO) tablet 10 mg Start: 11/23/22 2200 Code: Full AB: Cefepime Therapy Recs: CIR, DME: bedside commode, walker, and wheelchair DISPO: Anticipated discharge to Home vs CIR pending  Bed availability and patient progress .  Signature: Buddy Duty, D.O.  Internal Medicine  Resident, PGY-2 [facility]  Pager: [phone] 6:33 AM, 11/24/2022   Please contact the on call pager after 5 pm and on weekends at [phone].

## 2022-11-24 NOTE — Progress Notes (Signed)
Occupational Therapy Treatment Patient Details Name: Carl Conley MRN: [id number] DOB: 09/07/1986 Today's Date: 11/24/2022   History of present illness 37 y/o male presents with B foot wounds 11/17/21 diagnosed with osteomyelitis B feet with MRI .  Underwent S/P right second toe amputation, debridement and bone biopsy of the later malleolus wound right  LE.   Left foot also underwent initial partial 2nd ray amputation, 3rd metatarsal head bone biopsy, and 1st metatarsal head bone biopsy.  Transmetatarsal amputation of the left foot completed 1/18.  PMH DM visual snow syndrome, corneal ulcer of the right eye.   OT comments  Patient demonstrated good progress with bed mobility and transfers with patient able to maintain NWB on LLE with UE support. Standing balance address with reaching with one extremity to simulate standing ADLs with min assist for balance.  Discharge recommendations changed to AIR from home due to limited assistance from family and would benefit for increase safety with functional transfers and standing tasks. Acute OT to continue to follow.    Recommendations for follow up therapy are one component of a multi-disciplinary discharge planning process, led by the attending physician.  Recommendations may be updated based on patient status, additional functional criteria and insurance authorization.    Follow Up Recommendations  Acute inpatient rehab (3hours/day)     Assistance Recommended at Discharge Frequent or constant Supervision/Assistance  Patient can return home with the following  A little help with walking and/or transfers;A little help with bathing/dressing/bathroom;Assistance with cooking/housework;Assist for transportation;Help with stairs or ramp for entrance   Equipment Recommendations  BSC/3in1    Recommendations for Other Services      Precautions / Restrictions Precautions Precautions: Fall Required Braces or Orthoses: Other Brace Other Brace: cam boot  LLE and post op shoe on the right Restrictions Weight Bearing Restrictions: Yes RLE Weight Bearing: Weight bearing as tolerated LLE Weight Bearing: Non weight bearing       Mobility Bed Mobility Overal bed mobility: Modified Independent Bed Mobility: Supine to Sit     Supine to sit: Supervision     General bed mobility comments: able to get to EOB without assistance    Transfers Overall transfer level: Needs assistance Equipment used: Rolling walker (2 wheels) Transfers: Sit to/from Stand Sit to Stand: From elevated surface, Min guard     Step pivot transfers: Mod assist, Min assist     General transfer comment: performed transfers from bed and recliner with min to mod assist     Balance Overall balance assessment: Needs assistance Sitting-balance support: Feet supported Sitting balance-Leahy Scale: Good     Standing balance support: Bilateral upper extremity supported, Single extremity supported, During functional activity Standing balance-Leahy Scale: Poor Standing balance comment: reaching tasks with one extremity while standing to simulate standing ADLs                           ADL either performed or assessed with clinical judgement   ADL Overall ADL's : Needs assistance/impaired                     Lower Body Dressing: Maximal assistance Lower Body Dressing Details (indicate cue type and reason): to donn LLE cam boot and right post op shoe Toilet Transfer: Moderate assistance;Ambulation;Rolling walker (2 wheels) Toilet Transfer Details (indicate cue type and reason): simulated with RW           General ADL Comments: mod assist for balance with toilet  transfer training for WB precautions and safety    Extremity/Trunk Assessment              Vision       Perception     Praxis      Cognition Arousal/Alertness: Awake/alert Behavior During Therapy: WFL for tasks assessed/performed Overall Cognitive Status: History of  cognitive impairments - at baseline                                 General Comments: able to recall WB precautions        Exercises      Shoulder Instructions       General Comments      Pertinent Vitals/ Pain       Pain Assessment Pain Assessment: Faces Pain Score: 0-No pain Pain Intervention(s): Monitored during session  Home Living                                          Prior Functioning/Environment              Frequency  Min 2X/week        Progress Toward Goals  OT Goals(current goals can now be found in the care plan section)  Progress towards OT goals: Progressing toward goals  Acute Rehab OT Goals Patient Stated Goal: get better OT Goal Formulation: With patient Time For Goal Achievement: 12/07/22 Potential to Achieve Goals: Good ADL Goals Pt Will Perform Grooming: with supervision;standing Pt Will Perform Lower Body Bathing: with supervision;sit to/from stand Pt Will Perform Lower Body Dressing: with supervision;sit to/from stand Pt Will Transfer to Toilet: ambulating;with supervision;bedside commode Pt Will Perform Toileting - Clothing Manipulation and hygiene: with supervision;sit to/from stand  Plan Discharge plan remains appropriate    Co-evaluation                 AM-PAC OT "6 Clicks" Daily Activity     Outcome Measure   Help from another person eating meals?: None Help from another person taking care of personal grooming?: A Little Help from another person toileting, which includes using toliet, bedpan, or urinal?: A Lot Help from another person bathing (including washing, rinsing, drying)?: A Little Help from another person to put on and taking off regular upper body clothing?: None Help from another person to put on and taking off regular lower body clothing?: A Little 6 Click Score: 19    End of Session Equipment Utilized During Treatment: Gait belt;Rolling walker (2 wheels)  OT Visit  Diagnosis: Unsteadiness on feet (R26.81);Muscle weakness (generalized) (M62.81);Other abnormalities of gait and mobility (R26.89)   Activity Tolerance Patient tolerated treatment well   Patient Left in chair;with call bell/phone within reach;with chair alarm set   Nurse Communication Mobility status        Time: 0630-1601 OT Time Calculation (min): 30 min  Charges: OT General Charges $OT Visit: 1 Visit OT Treatments $Self Care/Home Management : 8-22 mins $Therapeutic Activity: 8-22 mins  Lodema Hong, [place]  Office [phone]   Trixie Dredge 11/24/2022, 10:19 AM

## 2022-11-24 NOTE — Progress Notes (Signed)
Peripherally Inserted Central Catheter Placement  The IV Nurse has discussed with the patient and/or persons authorized to consent for the patient, the purpose of this procedure and the potential benefits and risks involved with this procedure.  The benefits include less needle sticks, lab draws from the catheter, and the patient may be discharged home with the catheter. Risks include, but not limited to, infection, bleeding, blood clot (thrombus formation), and puncture of an artery; nerve damage and irregular heartbeat and possibility to perform a PICC exchange if needed/ordered by physician.  Alternatives to this procedure were also discussed.  Bard Power PICC patient education guide, fact sheet on infection prevention and patient information card has been provided to patient /or left at bedside.    PICC Placement Documentation  PICC Single Lumen 11/24/22 Right Brachial 37 cm 0 cm (Active)  Indication for Insertion or Continuance of Line Home intravenous therapies (PICC only) 11/24/22 1210  Exposed Catheter (cm) 0 cm 11/24/22 1210  Site Assessment Clean, Dry, Intact 11/24/22 1210  Line Status Flushed;Saline locked;Blood return noted 11/24/22 1210  Dressing Type Transparent;Securing device 11/24/22 1210  Dressing Status Antimicrobial disc in place;Clean, Dry, Intact 11/24/22 1210  Safety Lock Not Applicable 46/80/32 1224  Line Care Tubing changed;Cap changed;Connections checked and tightened 11/24/22 1210  Line Adjustment (NICU/IV Team Only) No 11/24/22 1210  Dressing Intervention New dressing 11/24/22 1210  Dressing Change Due 12/01/22 11/24/22 1210       Carl Conley 11/24/2022, 12:11 PM

## 2022-11-24 NOTE — Progress Notes (Signed)
Physical Therapy Treatment Patient Details Name: Carl Conley MRN: [id number] DOB: 1985/12/12 Today's Date: 11/24/2022   History of Present Illness 37 y/o male presents with B foot wounds 11/17/21 diagnosed with osteomyelitis B feet with MRI .  Underwent S/P right second toe amputation, debridement and bone biopsy of the later malleolus wound right  LE.   Left foot also underwent initial partial 2nd ray amputation, 3rd metatarsal head bone biopsy, and 1st metatarsal head bone biopsy.  Transmetatarsal amputation of the left foot completed 1/18.  PMH DM visual snow syndrome, corneal ulcer of the right eye.    PT Comments    Pt greeted supine in bed and agreeable to session with continued progress towards acute goals. Pt able to demonstrate increased foot clearance on R during hop-to gait with RW and min guard for safety and good adherence to NWB on L during all transfers STS. Although pt making progress with gait pt with max difficulty with single step negotiation needing max assist and unsafe to ascend/descend steps into home at this time. Pt able to ascend first step with RW backwards as pt steps at home have no handrail, pt then unable to maintain single leg stance on R to lift RW to progress to next step without MAX assist to maintain balance and prevent fall, pt with x3 LOB reaching for rail needing max assist to prevent fall to advance RW back to bottom of steps and descend one step to landing. Pt continues to benefit from skilled PT services to progress toward functional mobility goals.     Recommendations for follow up therapy are one component of a multi-disciplinary discharge planning process, led by the attending physician.  Recommendations may be updated based on patient status, additional functional criteria and insurance authorization.  Follow Up Recommendations  Acute inpatient rehab (3hours/day)     Assistance Recommended at Discharge Intermittent Supervision/Assistance  Patient  can return home with the following A little help with walking and/or transfers;Help with stairs or ramp for entrance;Assist for transportation;A little help with bathing/dressing/bathroom   Equipment Recommendations  Rolling walker (2 wheels);Wheelchair (measurements PT);Wheelchair cushion (measurements PT)    Recommendations for Other Services       Precautions / Restrictions Precautions Precautions: Fall Required Braces or Orthoses: Other Brace Other Brace: cam boot LLE and post op shoe on the right Restrictions Weight Bearing Restrictions: Yes RLE Weight Bearing: Weight bearing as tolerated LLE Weight Bearing: Non weight bearing     Mobility  Bed Mobility Overal bed mobility: Modified Independent Bed Mobility: Supine to Sit, Sit to Supine     Supine to sit: Supervision Sit to supine: Supervision   General bed mobility comments: able to get to EOB without assistance    Transfers Overall transfer level: Needs assistance Equipment used: Rolling walker (2 wheels) Transfers: Sit to/from Stand Sit to Stand: Min assist, Min guard           General transfer comment: min asssit to power up from bed and recliner, down to min guard at end of session from reclienr, good adherence to [employer] precautions    Ambulation/Gait Ambulation/Gait assistance: Min guard Gait Distance (Feet): 20 Feet Assistive device: Rolling walker (2 wheels) Gait Pattern/deviations: Step-to pattern       General Gait Details: short gait with session focused on stair negotitaion, good adherence to NWB on L, able to hop high enough to clear RLE each step   Stairs Stairs: Yes Stairs assistance: Mod assist, Max assist Stair Management: No rails,  Backwards, With walker Number of Stairs: 1 General stair comments: after demo and explanation pt able to ascend 1 step with RW backwards as pt steps at home have no handrail, pt unable to maintain single leg stance on R to lift RW to progress to next step  without MAX assist to maintain balance and prevent fall, pt with x3 LOB reaching for rail needing max assist to prevent fall to advance RW back to bottom of steps.   Wheelchair Mobility    Modified Rankin (Stroke Patients Only)       Balance Overall balance assessment: Needs assistance Sitting-balance support: Feet supported Sitting balance-Leahy Scale: Good     Standing balance support: Bilateral upper extremity supported, Single extremity supported, During functional activity Standing balance-Leahy Scale: Poor Standing balance comment: reliance on external assist                            Cognition Arousal/Alertness: Awake/alert Behavior During Therapy: WFL for tasks assessed/performed Overall Cognitive Status: History of cognitive impairments - at baseline                                 General Comments: able to recall WB precautions        Exercises      General Comments General comments (skin integrity, edema, etc.): VSS on RA      Pertinent Vitals/Pain Pain Assessment Pain Assessment: No/denies pain    Home Living                          Prior Function            PT Goals (current goals can now be found in the care plan section) Acute Rehab PT Goals PT Goal Formulation: With patient Time For Goal Achievement: 12/07/22 Progress towards PT goals: Progressing toward goals    Frequency    Min 3X/week      PT Plan Current plan remains appropriate    Co-evaluation              AM-PAC PT "6 Clicks" Mobility   Outcome Measure  Help needed turning from your back to your side while in a flat bed without using bedrails?: None Help needed moving from lying on your back to sitting on the side of a flat bed without using bedrails?: None Help needed moving to and from a bed to a chair (including a wheelchair)?: A Little Help needed standing up from a chair using your arms (e.g., wheelchair or bedside chair)?:  A Little Help needed to walk in hospital room?: A Little Help needed climbing 3-5 steps with a railing? : Total 6 Click Score: 18    End of Session Equipment Utilized During Treatment: Gait belt Activity Tolerance: Patient tolerated treatment well Patient left: in bed;with call bell/phone within reach Nurse Communication: Mobility status PT Visit Diagnosis: Other abnormalities of gait and mobility (R26.89);Difficulty in walking, not elsewhere classified (R26.2)     Time: 4098-1191 PT Time Calculation (min) (ACUTE ONLY): 28 min  Charges:  $Gait Training: 23-37 mins                     Tapanga Ottaway R. PTA Acute Rehabilitation Services Office: [place] 11/24/2022, 4:33 PM

## 2022-11-25 LAB — GLUCOSE, CAPILLARY
Glucose-Capillary: 168 mg/dL — ABNORMAL HIGH (ref 70–99)
Glucose-Capillary: 118 mg/dL — ABNORMAL HIGH (ref 70–99)
Glucose-Capillary: 160 mg/dL — ABNORMAL HIGH (ref 70–99)
Glucose-Capillary: 192 mg/dL — ABNORMAL HIGH (ref 70–99)

## 2022-11-25 NOTE — Progress Notes (Signed)
Subjective:  NAEON.  Recovering well postop.  Pain is well-managed, tolerating p.o. intake without any nausea or vomiting.  Having bowel movements.  Pending dispo planning.  PT OT evaluated and suggested CIR, but  beds likely will not be available until early/mid next week.  Discussed planning with patient and he is understanding.  He is going to try to contact his mother again today.  Objective:  Vital signs in last 24 hours: Vitals:   11/24/22 0502 11/24/22 0811 11/24/22 2031 11/25/22 0600  BP: 109/72 98/72 109/69 109/74  Pulse: 68 71 73 68  Resp: 17 14    Temp: 98 F (36.7 C) 98.6 F (37 C) 98.4 F (36.9 C) 98.1 F (36.7 C)  TempSrc: Oral Oral Oral Oral  SpO2: 98% 97% 98% 99%  Weight:      Height:       Physical Exam: General: Awake and alert, not in acute distress Cardiac: RRR, no MRG Respiratory: CTAB, normal work of breathing on room air Extremities: Bilateral feet covered in dressings. S/p amputation of R 2nd toe at MTP joint and L TMA.  Boots in place Neuro: Alert and oriented x4.     Assessment/Plan:  Principal Problem:   Diabetic osteomyelitis ([place]) Active Problems:   Osteomyelitis of left foot ([facility])  Carl Conley is a 37 year old man with a PMHx significant for T2DM, NAFLD, ADHD, and Depression who presents for bilateral foot wounds and admitted for management of bilateral osteomyelitis.    Bilateral Feet Osteomyelitis 2/2 to diabetic neuropathy Status post bilateral partial lower extremity amputations Presented w/ worsening bilateral foot wounds over the last 6 months. Previously seen in clinic, started on augmentin/doxycycline w/o resolution. Feet XRAY from 1/11 demonstrate bony destruction of the second toes bilaterally, and bony irregularity and periosteal reaction of the left metatarsal shaft consistent with osteomyelitis. CRP elevated at 1.1. ESR elevated at 49. MRI of L/R foot on admission with osteo.  The patient is now s\p amputation of R 2nd toe at  MTP joint, R distal fibula biopsy, debridement of R ankle, L TMA. Cultures thus far are growing MSSA and pseudomonas. R distal fibula bx also showing osteo, discussed with vascular and podiatry and they recommended attempting conservative management with abx and followup surgery only if necessary.  ID recommending outpatient IV cefepime, PICC line was placed and OPAT orders placed.  Currently pending dispo planning. - appreciate podiatry, vascular, ID recs - plan for f/u ABI in 1 year  - continue to monitor cultures - Continue IV Cefepime per pharmacy.  PICC line and OPAT orders placed.  Needs outpatient ID follow-up. - Trend CBC - Scheduled Tylenol 1000 3 times daily, oxycodone 10 mg prn every 8 for severe pain -T/OT recommended CIR.  I think this would be a good option for him as well given his prior good baseline function before current hospitalization.   Hyperkalemia (resolved) Mild hyponatremia (resolved)   T2DM A1c 6.5% from 10/2022.  On Invokamet 150-1000mg  BID at home. CBGs largely well managed postop -Hold home Invokamet -SSI  -Trend CBGs, maintain < 180 postop   Hx of depression Chronic condition. Takes citalopram at home.  -Continue home citalopram 20mg    Hx of HSV keratitis Diagnosed in 2017. Seen by Dr. Katy Fitch, ophthalmology. Takes acyclovir at home.  -Continue home acyclovir 400mg  BID   DVT Prophylaxis: Xarelto Diet: Carb modified,  Bowel: miralax PRN IVF: none Code: Full   Prior to Admission Living Arrangement: home w/ mother Anticipated Discharge Location: TBD  Barriers to Discharge: continued management Dispo: Anticipated discharge in approximately more than 2 day(s).   [name], MD 11/25/2022, 6:58 AM Pager: [phone] After 5pm on weekdays and 1pm on weekends: On Call pager [phone]

## 2022-11-25 NOTE — Progress Notes (Signed)
Subjective  - POD #3, s/p left TMA  No complaints   Physical Exam:  Wound dressing intact Palpable femoral pulses      Assessment/Plan:  POD #3  The patient has poorly controlled diabetes and has undergone right second toe amputation and ankle wound debridement as well as left second toe and ultimately transmetatarsal amputation.  I discussed with the patient that given his multiple bilateral wounds and subsequent amputation as well as persistent right given his ABIs, we should proceed with angiography to definitively define and make sure that his blood flow has been optimized.  We will proceed with angiogram tomorrow.  I discussed the risks and benefits with him.  All questions were answered  Wells Jobani Sabado 11/25/2022 8:14 AM --  Vitals:   11/24/22 2031 11/25/22 0600  BP: 109/69 109/74  Pulse: 73 68  Resp:    Temp: 98.4 F (36.9 C) 98.1 F (36.7 C)  SpO2: 98% 99%    Intake/Output Summary (Last 24 hours) at 11/25/2022 0814 Last data filed at 11/25/2022 8250 Gross per 24 hour  Intake 840 ml  Output 2940 ml  Net -2100 ml     Laboratory CBC    Component Value Date/Time   WBC 8.1 11/24/2022 0347   HGB 10.1 (L) 11/24/2022 0347   HGB 16.8 02/04/2020 1406   HCT 30.4 (L) 11/24/2022 0347   HCT 47.6 02/04/2020 1406   PLT 214 11/24/2022 0347   PLT 255 02/04/2020 1406    BMET    Component Value Date/Time   NA 135 11/24/2022 0347   NA 138 07/17/2021 1408   K 3.8 11/24/2022 0347   CL 104 11/24/2022 0347   CO2 21 (L) 11/24/2022 0347   GLUCOSE 163 (H) 11/24/2022 0347   BUN 18 11/24/2022 0347   BUN 16 07/17/2021 1408   CREATININE 0.94 11/24/2022 0347   CREATININE 0.78 02/12/2012 2053   CALCIUM 8.9 11/24/2022 0347   GFRNONAA >60 11/24/2022 0347   GFRAA 112 02/04/2020 1406    COAG No results found for: "INR", "PROTIME" No results found for: "PTT"  Antibiotics Anti-infectives (From admission, onward)    Start     Dose/Rate Route Frequency Ordered Stop    11/23/22 0000  ceFEPime (MAXIPIME) IVPB        2 g Intravenous Every 8 hours 11/23/22 1507 01/03/23 2359   11/21/22 1000  vancomycin (VANCOREADY) IVPB 750 mg/150 mL  Status:  Discontinued        750 mg 150 mL/hr over 60 Minutes Intravenous Every 12 hours 11/21/22 0902 11/22/22 1034   11/18/22 1045  acyclovir (ZOVIRAX) 200 MG capsule 400 mg        400 mg Oral 2 times daily 11/18/22 0953     11/18/22 1000  vancomycin (VANCOREADY) IVPB 1250 mg/250 mL  Status:  Discontinued        1,250 mg 166.7 mL/hr over 90 Minutes Intravenous Every 12 hours 11/17/22 2235 11/21/22 0902   11/18/22 0600  ceFEPIme (MAXIPIME) 2 g in sodium chloride 0.9 % 100 mL IVPB        2 g 200 mL/hr over 30 Minutes Intravenous Every 8 hours 11/17/22 2235     11/17/22 2200  vancomycin (VANCOREADY) IVPB 2000 mg/400 mL        2,000 mg 200 mL/hr over 120 Minutes Intravenous  Once 11/17/22 1951 11/18/22 0038   11/17/22 2130  ceFEPIme (MAXIPIME) 2 g in sodium chloride 0.9 % 100 mL IVPB  2 g 200 mL/hr over 30 Minutes Intravenous  Once 11/17/22 1951 11/17/22 2132        V. Leia Alf, M.D., [facility] [place] Office: [phone] Pager:  [phone]

## 2022-11-25 NOTE — Progress Notes (Signed)
Physical Therapy Treatment Patient Details Name: Carl Conley MRN: [id number] DOB: 1986/09/04 Today's Date: 11/25/2022   History of Present Illness 37 y/o male presents with B foot wounds 11/17/21 diagnosed with osteomyelitis B feet with MRI .  Underwent S/P right second toe amputation, debridement and bone biopsy of the later malleolus wound right  LE.   Left foot also underwent initial partial 2nd ray amputation, 3rd metatarsal head bone biopsy, and 1st metatarsal head bone biopsy.  Transmetatarsal amputation of the left foot completed 1/18.  PMH DM visual snow syndrome, corneal ulcer of the right eye.    PT Comments    Pt is slowly progressing towards goals. Pt is demonstrating weakness and poor kinesthetic awareness that results in poor balance and difficulty with stair navigation. Pt requires increased assistance with sit to stand from any other surface than the EOB which is elevated from a regular sitting height. Due to pt current functional status, need for physical assistance, home set up, age and prior level of physical functioning recommending AIR on discharge from acute care hospital setting in order to decrease risk for falls, injury and re-hospitalization. Pt required a 5 min rest break after ambulating longer distance this session and attempting stairs.     Recommendations for follow up therapy are one component of a multi-disciplinary discharge planning process, led by the attending physician.  Recommendations may be updated based on patient status, additional functional criteria and insurance authorization.  Follow Up Recommendations  Acute inpatient rehab (3hours/day)     Assistance Recommended at Discharge Intermittent Supervision/Assistance  Patient can return home with the following A little help with walking and/or transfers;Help with stairs or ramp for entrance;Assist for transportation;A little help with bathing/dressing/bathroom   Equipment Recommendations  Rolling  walker (2 wheels);Wheelchair (measurements PT);Wheelchair cushion (measurements PT)    Recommendations for Other Services Rehab consult     Precautions / Restrictions Precautions Precautions: Fall Required Braces or Orthoses: Other Brace Other Brace: cam boot LLE and post op shoe on the right Restrictions Weight Bearing Restrictions: Yes RLE Weight Bearing: Weight bearing as tolerated LLE Weight Bearing: Non weight bearing     Mobility  Bed Mobility Overal bed mobility: Modified Independent Bed Mobility: Supine to Sit, Sit to Supine     Supine to sit: Supervision Sit to supine: Supervision   General bed mobility comments: able to get to EOB without assistance Patient Response: Cooperative  Transfers   Equipment used: Rolling walker (2 wheels) Transfers: Sit to/from Stand Sit to Stand: Min guard, Mod assist           General transfer comment: Pt was Min guard from EOB and Mod A from lower seat in hall after rest break. Demonstrating overall generalized weakness.    Ambulation/Gait Ambulation/Gait assistance: Min guard Gait Distance (Feet): 40 Feet (40 ft and 30 ft) Assistive device: Rolling walker (2 wheels) Gait Pattern/deviations: Step-to pattern   Gait velocity interpretation: <1.31 ft/sec, indicative of household ambulator   General Gait Details: Pt demonstrates good adherence to NWB precautions. Multi directional sway which is why pt is requiring Min guarding.   Stairs Stairs: Yes Stairs assistance: Max assist Stair Management: No rails, Backwards, With walker Number of Stairs: 0 General stair comments: Pt was unable to progress up one step today with RW. Pt is demonstrating very poor balance and is not a candidate for crutches at this time.        Balance Overall balance assessment: Needs assistance Sitting-balance support: Feet supported Sitting balance-Leahy  Scale: Good Sitting balance - Comments: Able to maintain balance sitting EOB but  requires assistance donning boot due to vision.   Standing balance support: Bilateral upper extremity supported, Single extremity supported, During functional activity Standing balance-Leahy Scale: Poor Standing balance comment: Pt must have UE support either single or bil UE to maintain balance while standing. With dynamic mobilty pt requires stable surface with RW.        Cognition Arousal/Alertness: Awake/alert Behavior During Therapy: WFL for tasks assessed/performed Overall Cognitive Status: History of cognitive impairments - at baseline       General Comments: able to recall WB precautions           General Comments General comments (skin integrity, edema, etc.): Pt is demonstrating generalized weakness overall. He performs transfers from EOB better than from a regular height surface and requires increased assistance. Pt cares for his mother at home and has no one to help with physical assistance.      Pertinent Vitals/Pain Pain Assessment Pain Assessment: No/denies pain     PT Goals (current goals can now be found in the care plan section) Acute Rehab PT Goals Patient Stated Goal: to return home PT Goal Formulation: With patient Time For Goal Achievement: 12/07/22 Potential to Achieve Goals: Good Additional Goals Additional Goal #1: Patient to propel wheelchair 150' S. Progress towards PT goals: Progressing toward goals    Frequency    Min 3X/week      PT Plan Current plan remains appropriate       AM-PAC PT "6 Clicks" Mobility   Outcome Measure  Help needed turning from your back to your side while in a flat bed without using bedrails?: A Little Help needed moving from lying on your back to sitting on the side of a flat bed without using bedrails?: A Little Help needed moving to and from a bed to a chair (including a wheelchair)?: A Little Help needed standing up from a chair using your arms (e.g., wheelchair or bedside chair)?: A Lot Help needed to  walk in hospital room?: A Little Help needed climbing 3-5 steps with a railing? : Total 6 Click Score: 15    End of Session Equipment Utilized During Treatment: Gait belt Activity Tolerance: Patient tolerated treatment well Patient left: in bed;with call bell/phone within reach Nurse Communication: Mobility status PT Visit Diagnosis: Other abnormalities of gait and mobility (R26.89);Difficulty in walking, not elsewhere classified (R26.2)     Time: 8850-2774 PT Time Calculation (min) (ACUTE ONLY): 23 min  Charges:  $Gait Training: 8-22 mins $Therapeutic Activity: 8-22 mins                    Tomma Rakers, DPT, [place] Office: [phone] (Secure chat preferred)    Ander Purpura 11/25/2022, 2:34 PM

## 2022-11-25 NOTE — Progress Notes (Signed)
  Subjective:  Patient ID: BURWELL BETHEL, male    DOB: [birth year],  MRN: [id number]  POD 3 transmetatarsal amputation Achilles tendon lengthening left  Feeling okay not having pain  Negative for chest pain and shortness of breath Fever: no Night sweats: no Weight loss: no Constitutional signs: no Objective:   Vitals:   11/24/22 2031 11/25/22 0600  BP: 109/69 109/74  Pulse: 73 68  Resp:    Temp: 98.4 F (36.9 C) 98.1 F (36.7 C)  SpO2: 98% 99%   General AA&O x3. Normal mood and affect.  Vascular Both feet warm and well-perfused  Neurologic Epicritic sensation grossly absent.  Dermatologic Dressings dry and intact, incisions on bilateral feet are healing well graft is intact with good take on right ankle  Orthopedic: MMT 5/5 in dorsiflexion, plantarflexion, inversion, and eversion. Normal joint ROM without pain or crepitus.    Assessment & Plan:  Patient was evaluated and treated and all questions answered.   POD 3 transmetatarsal condition Achilles tendon lengthening left -NWB left, WBAT right -Cam walker boot on left and postop shoe on right -Final path is pending -Likely will need 6 weeks IV antibiotics with PICC line, ID is aware and following, growing Pseudomonas from right fibula culture, should have a surgical cure on left foot -Follow-up with Dr. 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

## 2022-11-26 ENCOUNTER — Encounter (HOSPITAL_COMMUNITY)
Disposition: A | Discharge status: Home Health | Payer: Self-pay | Source: Ambulatory Visit | Attending: Internal Medicine

## 2022-11-26 ENCOUNTER — Encounter (HOSPITAL_COMMUNITY): Payer: Self-pay | Admitting: Podiatry

## 2022-11-26 DIAGNOSIS — E08621 Diabetes mellitus due to underlying condition with foot ulcer: Secondary | ICD-10-CM

## 2022-11-26 HISTORY — PX: ABDOMINAL AORTOGRAM W/LOWER EXTREMITY: CATH118223

## 2022-11-26 LAB — CREATININE, SERUM
Creatinine, Ser: 0.98 mg/dL (ref 0.61–1.24)
GFR, Estimated: >60 mL/min (ref >60)

## 2022-11-26 LAB — CBC
HCT: 31.8 % — ABNORMAL LOW (ref 39.0–52.0)
Hemoglobin: 9.8 g/dL — ABNORMAL LOW (ref 13.0–17.0)
MCH: 25.5 pg — ABNORMAL LOW (ref 26.0–34.0)
MCHC: 30.8 g/dL (ref 30.0–36.0)
MCV: 82.8 fL (ref 80.0–100.0)
Platelets: 277 10*3/uL (ref 150–400)
RBC: 3.84 MIL/uL — ABNORMAL LOW (ref 4.22–5.81)
RDW: 15.6 % — ABNORMAL HIGH (ref 11.5–15.5)
WBC: 7.2 10*3/uL (ref 4.0–10.5)
nRBC: 0 % (ref 0.0–0.2)

## 2022-11-26 LAB — GLUCOSE, CAPILLARY
Glucose-Capillary: 157 mg/dL — ABNORMAL HIGH (ref 70–99)
Glucose-Capillary: 118 mg/dL — ABNORMAL HIGH (ref 70–99)
Glucose-Capillary: 153 mg/dL — ABNORMAL HIGH (ref 70–99)

## 2022-11-26 SURGERY — ABDOMINAL AORTOGRAM W/LOWER EXTREMITY
Anesthesia: LOCAL

## 2022-11-26 MED ORDER — MIDAZOLAM HCL 2 MG/2ML IJ SOLN
INTRAMUSCULAR | Status: AC
  Filled 2022-11-26: qty 2

## 2022-11-26 MED ORDER — SODIUM CHLORIDE 0.9 % IV SOLN: INTRAVENOUS | Status: DC

## 2022-11-26 MED ORDER — LIDOCAINE HCL (PF) 1 % IJ SOLN
INTRAMUSCULAR | Status: DC | PRN
  Administered 2022-11-26: 30 mL via INTRADERMAL

## 2022-11-26 MED ORDER — HEPARIN SODIUM (PORCINE) 5000 UNIT/ML IJ SOLN
5000.0000 [IU] | Freq: Three times a day (TID) | INTRAMUSCULAR | Status: DC
  Administered 2022-11-26 – 2022-11-27 (×2): 5000 [IU] via SUBCUTANEOUS
  Filled 2022-11-26 (×2): qty 1

## 2022-11-26 MED ORDER — LABETALOL HCL 5 MG/ML IV SOLN: 10.0000 mg | INTRAVENOUS | Status: DC | PRN

## 2022-11-26 MED ORDER — MIDAZOLAM HCL 2 MG/2ML IJ SOLN
INTRAMUSCULAR | Status: DC | PRN
  Administered 2022-11-26: 1 mg via INTRAVENOUS

## 2022-11-26 MED ORDER — HYDRALAZINE HCL 20 MG/ML IJ SOLN: 5.0000 mg | INTRAMUSCULAR | Status: DC | PRN

## 2022-11-26 MED ORDER — SODIUM CHLORIDE 0.9 % IV SOLN: 250.0000 mL | INTRAVENOUS | Status: DC | PRN

## 2022-11-26 MED ORDER — HEPARIN (PORCINE) IN NACL 1000-0.9 UT/500ML-% IV SOLN
INTRAVENOUS | Status: DC | PRN
  Administered 2022-11-26 (×2): 500 mL

## 2022-11-26 MED ORDER — FENTANYL CITRATE (PF) 100 MCG/2ML IJ SOLN
INTRAMUSCULAR | Status: AC
  Filled 2022-11-26: qty 2

## 2022-11-26 MED ORDER — LIDOCAINE HCL (PF) 1 % IJ SOLN
INTRAMUSCULAR | Status: AC
  Filled 2022-11-26: qty 30

## 2022-11-26 MED ORDER — SODIUM CHLORIDE 0.9 % IV SOLN
INTRAVENOUS | Status: AC | PRN
  Administered 2022-11-26: 100 mL/h via INTRAVENOUS

## 2022-11-26 MED ORDER — FENTANYL CITRATE (PF) 100 MCG/2ML IJ SOLN
INTRAMUSCULAR | Status: DC | PRN
  Administered 2022-11-26: 25 ug via INTRAVENOUS
  Administered 2022-11-26: 50 ug via INTRAVENOUS

## 2022-11-26 MED ORDER — SODIUM CHLORIDE 0.9% FLUSH
3.0000 mL | INTRAVENOUS | Status: DC | PRN
  Administered 2022-11-29: 3 mL via INTRAVENOUS

## 2022-11-26 MED ORDER — ACETAMINOPHEN 325 MG PO TABS: 650.0000 mg | ORAL_TABLET | ORAL | Status: DC | PRN

## 2022-11-26 MED ORDER — HEPARIN (PORCINE) IN NACL 1000-0.9 UT/500ML-% IV SOLN
INTRAVENOUS | Status: AC
  Filled 2022-11-26: qty 1000

## 2022-11-26 MED ORDER — IODIXANOL 320 MG/ML IV SOLN
INTRAVENOUS | Status: DC | PRN
  Administered 2022-11-26: 175 mL via INTRA_ARTERIAL

## 2022-11-26 MED ORDER — SODIUM CHLORIDE 0.9% FLUSH
3.0000 mL | Freq: Two times a day (BID) | INTRAVENOUS | Status: DC
  Administered 2022-11-27 – 2022-12-03 (×8): 3 mL via INTRAVENOUS

## 2022-11-26 MED ORDER — SODIUM CHLORIDE 0.9 % WEIGHT BASED INFUSION: 1.0000 mL/kg/h | INTRAVENOUS | Status: AC

## 2022-11-26 MED ORDER — ORAL CARE MOUTH RINSE: 15.0000 mL | OROMUCOSAL | Status: DC | PRN

## 2022-11-26 MED ORDER — ONDANSETRON HCL 4 MG/2ML IJ SOLN: 4.0000 mg | Freq: Four times a day (QID) | INTRAMUSCULAR | Status: DC | PRN

## 2022-11-26 SURGICAL SUPPLY — 14 items
CATH OMNI FLUSH 5F 65CM (CATHETERS) IMPLANT
DEVICE CLOSURE MYNXGRIP 5F (Vascular Products) IMPLANT
DEVICE TORQUE .025-.038 (MISCELLANEOUS) IMPLANT
GLIDEWIRE ANGLED SS 035X260CM (WIRE) IMPLANT
GUIDEWIRE .025 260CM (WIRE) IMPLANT
KIT MICROPUNCTURE NIT STIFF (SHEATH) IMPLANT
KIT PV (KITS) ×2 IMPLANT
SHEATH PINNACLE 5F 10CM (SHEATH) IMPLANT
SHEATH PROBE COVER 6X72 (BAG) IMPLANT
STOPCOCK MORSE 400PSI 3WAY (MISCELLANEOUS) IMPLANT
TRANSDUCER W/STOPCOCK (MISCELLANEOUS) ×2 IMPLANT
TRAY PV CATH (CUSTOM PROCEDURE TRAY) ×2 IMPLANT
TUBING CIL FLEX 10 FLL-RA (TUBING) IMPLANT
WIRE BENTSON .035X145CM (WIRE) IMPLANT

## 2022-11-26 NOTE — Progress Notes (Signed)
   Subjective:  NAEON.  NPO for angiogram.  No acute concerns today.  Tolerating p.o. intake.  Pain is well-controlled.  Having bowel movements.  Objective:  Vital signs in last 24 hours: Vitals:   11/25/22 0600 11/25/22 0843 11/25/22 1700 11/26/22 0009  BP: 109/74 105/69 106/70 104/69  Pulse: 68 71 70 (!) 59  Resp:  16 16 16   Temp: 98.1 F (36.7 C) 98.2 F (36.8 C) 98.1 F (36.7 C) 97.8 F (36.6 C)  TempSrc: Oral Oral Oral Oral  SpO2: 99% 99% 99% 98%  Weight:      Height:       Physical Exam: General: Awake and alert, not in acute distress Cardiac: RRR, no MRG Respiratory: CTAB, normal work of breathing on room air Extremities: Bilateral feet covered in dressings. S/p amputation of R 2nd toe at MTP joint and L TMA.  Boots in place Neuro: Alert and oriented x4.    Assessment/Plan:  Principal Problem:   Diabetic osteomyelitis ([place]) Active Problems:   Osteomyelitis of left foot ([facility])  Carl Conley is a 37 year old man with a PMHx significant for T2DM, NAFLD, ADHD, and Depression who presents for bilateral foot wounds and admitted for management of bilateral osteomyelitis.    Bilateral Feet Osteomyelitis 2/2 to diabetic neuropathy Status post bilateral partial lower extremity amputations Presented w/ worsening bilateral foot wounds over the last 6 months. Previously seen in clinic, started on augmentin/doxycycline w/o resolution. Feet XRAY from 1/11 demonstrate bony destruction of the second toes bilaterally, and bony irregularity and periosteal reaction of the left metatarsal shaft consistent with osteomyelitis. CRP elevated at 1.1. ESR elevated at 49. MRI of L/R foot on admission with osteo.  The patient is now s\p amputation of R 2nd toe at MTP joint, R distal fibula biopsy, debridement of R ankle, L TMA. Cultures thus far are growing MSSA and pseudomonas. R distal fibula bx also showing osteo, discussed with vascular and podiatry and they recommended attempting  conservative management with abx and followup surgery only if necessary.  ID recommending outpatient IV cefepime, PICC line was placed and OPAT orders placed.  Angiogram today shows sufficiently patent vasculature. currently pending dispo planning. - appreciate podiatry, vascular, ID recs - plan for f/u ABI in 1 year  - Continue IV Cefepime per pharmacy.  PICC line and OPAT orders placed.  Needs outpatient ID follow-up. - Trend CBC - Scheduled Tylenol 1000 3 times daily, oxycodone 10 mg prn every 8 for severe pain -T/OT recommended CIR.  I think this would be a good option for him as well given his prior good baseline function before current hospitalization.   Hyperkalemia (resolved) Mild hyponatremia (resolved)   T2DM A1c 6.5% from 10/2022.  On Invokamet 150-1000mg  BID at home. CBGs largely well managed postop -Hold home Invokamet -SSI  -Trend CBGs, maintain < 180 postop   Hx of depression Chronic condition. Takes citalopram at home.  -Continue home citalopram 20mg    Hx of HSV keratitis Diagnosed in 2017. Seen by Dr. Katy Fitch, ophthalmology. Takes acyclovir at home.  -Continue home acyclovir 400mg  BID   DVT Prophylaxis: Xarelto Diet: Carb modified,  Bowel: miralax PRN IVF: none Code: Full   Prior to Admission Living Arrangement: home w/ mother Anticipated Discharge Location: TBD Barriers to Discharge: continued management Dispo: Anticipated discharge in approximately more than 2 day(s).   Carl Galas, MD 11/26/2022, 7:21 AM Pager: [phone] After 5pm on weekdays and 1pm on weekends: On Call pager [phone]

## 2022-11-26 NOTE — Progress Notes (Signed)
Pt. Off unit for angiogram

## 2022-11-26 NOTE — Progress Notes (Signed)
Subjective  - POD #3, s/p left TMA  No complaints   Physical Exam:  Wound dressing intact Palpable femoral pulses Comfortable Regular rate, non-labored breathing   Assessment/Plan:  POD #3  The patient has poorly controlled diabetes and has undergone right second toe amputation and ankle wound debridement as well as left second toe and ultimately transmetatarsal amputation.  I discussed with the patient that given his multiple bilateral wounds and subsequent amputation as well as persistent right given his ABIs, we should proceed with angiography to definitively define and make sure that his blood flow has been optimized.  We will proceed with angiogram today  I discussed the risks and benefits with him.  All questions were answered  Carl Conley 11/26/2022 11:02 AM --  Vitals:   11/26/22 0009 11/26/22 0817  BP: 104/69 116/80  Pulse: (!) 59 65  Resp: 16   Temp: 97.8 F (36.6 C) 98.5 F (36.9 C)  SpO2: 98% 99%    Intake/Output Summary (Last 24 hours) at 11/26/2022 1102 Last data filed at 11/26/2022 0900 Gross per 24 hour  Intake 2340 ml  Output 2525 ml  Net -185 ml      Laboratory CBC    Component Value Date/Time   WBC 8.1 11/24/2022 0347   HGB 10.1 (L) 11/24/2022 0347   HGB 16.8 02/04/2020 1406   HCT 30.4 (L) 11/24/2022 0347   HCT 47.6 02/04/2020 1406   PLT 214 11/24/2022 0347   PLT 255 02/04/2020 1406    BMET    Component Value Date/Time   NA 135 11/24/2022 0347   NA 138 07/17/2021 1408   K 3.8 11/24/2022 0347   CL 104 11/24/2022 0347   CO2 21 (L) 11/24/2022 0347   GLUCOSE 163 (H) 11/24/2022 0347   BUN 18 11/24/2022 0347   BUN 16 07/17/2021 1408   CREATININE 0.94 11/24/2022 0347   CREATININE 0.78 02/12/2012 2053   CALCIUM 8.9 11/24/2022 0347   GFRNONAA >60 11/24/2022 0347   GFRAA 112 02/04/2020 1406    COAG No results found for: "INR", "PROTIME" No results found for: "PTT"  Antibiotics Anti-infectives (From admission, onward)     Start     Dose/Rate Route Frequency Ordered Stop   11/23/22 0000  ceFEPime (MAXIPIME) IVPB        2 g Intravenous Every 8 hours 11/23/22 1507 01/03/23 2359   11/21/22 1000  vancomycin (VANCOREADY) IVPB 750 mg/150 mL  Status:  Discontinued        750 mg 150 mL/hr over 60 Minutes Intravenous Every 12 hours 11/21/22 0902 11/22/22 1034   11/18/22 1045  acyclovir (ZOVIRAX) 200 MG capsule 400 mg        400 mg Oral 2 times daily 11/18/22 0953     11/18/22 1000  vancomycin (VANCOREADY) IVPB 1250 mg/250 mL  Status:  Discontinued        1,250 mg 166.7 mL/hr over 90 Minutes Intravenous Every 12 hours 11/17/22 2235 11/21/22 0902   11/18/22 0600  ceFEPIme (MAXIPIME) 2 g in sodium chloride 0.9 % 100 mL IVPB        2 g 200 mL/hr over 30 Minutes Intravenous Every 8 hours 11/17/22 2235     11/17/22 2200  vancomycin (VANCOREADY) IVPB 2000 mg/400 mL        2,000 mg 200 mL/hr over 120 Minutes Intravenous  Once 11/17/22 1951 11/18/22 0038   11/17/22 2130  ceFEPIme (MAXIPIME) 2 g in sodium chloride 0.9 % 100 mL IVPB  2 g 200 mL/hr over 30 Minutes Intravenous  Once 11/17/22 1951 11/17/22 2132

## 2022-11-26 NOTE — Op Note (Addendum)
    Patient name: KHAMARION BJELLAND MRN: [id number] DOB: Feb 14, 1986 Sex: male  11/26/2022 Pre-operative Diagnosis: Bilateral lower extremity diabetic foot infection Post-operative diagnosis:  Same Surgeon:  Broadus John, MD Procedure Performed: 1.  Ultrasound-guided micropuncture access of the right common femoral artery 2.  Aortogram 3.  Bilateral lower extremity angiogram 4.  Second-order cannulation, left lower extremity angiogram 5.  Moderate sedation time 34 minutes 6.  Contrast volume 19ml 7.  Device assisted closure, Mynx  Indications: Patient is a 37 year old male with longstanding history of uncontrolled diabetes, most recently status post left lower extremity TMA.  He has right-sided wound with tibial culture growing Pseudomonas.  After discussing risk and benefits of bilateral lower extremity angiography in an effort to define, and possibly improve perfusion distally to aid in wound healing, And elected to proceed.  Findings:  No flow-limiting stenosis appreciated in bilateral lower extremities.   Small vessel disease in the feet, Incomplete pedal arches bilaterally.    Procedure:  The patient was identified in the holding area and taken to room 8.  The patient was then placed supine on the table and prepped and draped in the usual sterile fashion.  A time out was called.  Ultrasound was used to evaluate the right common femoral artery.  It was patent .  A digital ultrasound image was acquired.  A micropuncture needle was used to access the right common femoral artery under ultrasound guidance.  An 018 wire was advanced without resistance and a micropuncture sheath was placed.  The 018 wire was removed and a benson wire was placed.  The micropuncture sheath was exchanged for a 5 french sheath.  An omniflush catheter was advanced over the wire to the level of L-1.  An abdominal angiogram was obtained.  Next, the catheter was pulled to the terminal aorta for bilateral lower extremity  angiogram.  Patient had considerable pain with contrast injection and moved causing the catheter pulled down into the right lower extremity.  After right lower extremity angiogram was completed, I chose to decrease the dose and focus on the left lower extremity. The aortic bifurcation was crossed and the catheter was placed into theleft external iliac artery and left runoff was obtained.  Impression: Normal arteries in bilateral lower extremities to the level of the foot.  In the foot the dorsalis pedis and posterior tibial arteries fill, however there is significant small vessel disease.  The pedal arches are not intact.    Cassandria Santee, MD [facility] Office: [phone]

## 2022-11-26 NOTE — Progress Notes (Signed)
Inpatient Rehab Admissions Coordinator:  ? ?Per therapy recommendations,  patient was screened for CIR candidacy by Kallan Merrick, MS, CCC-SLP. At this time, Pt. Appears to be a a potential candidate for CIR. I will place   order for rehab consult per protocol for full assessment. Please contact me any with questions. ? ?Carl Hilleary, MS, CCC-SLP ?Rehab Admissions Coordinator  ?[phone] (celll) ?[phone] (office) ? ?

## 2022-11-26 NOTE — Progress Notes (Signed)
Occupational Therapy Treatment Patient Details Name: Carl Conley MRN: [id number] DOB: 04-12-1986 Today's Date: 11/26/2022   History of present illness 37 y/o male presents with B foot wounds 11/17/21 diagnosed with osteomyelitis B feet with MRI .  Underwent S/P right second toe amputation, debridement and bone biopsy of the later malleolus wound right  LE.   Left foot also underwent initial partial 2nd ray amputation, 3rd metatarsal head bone biopsy, and 1st metatarsal head bone biopsy.  Transmetatarsal amputation of the left foot completed 1/18.  PMH DM visual snow syndrome, corneal ulcer of the right eye.   OT comments  Patient demonstrated progress this treatment session with min assist to stand from raised bed and from regular toilet with education on rail use to stand. Patient able to perform hand and face hygiene standing at sink with one extremity support with sink and maintain WB precautions. Patient performed static standing with reaching tasks and tolerating 3 minutes of standing. Patient would benefit from continue OT treatment in AIR setting to provide safe discharge home due to limited assistance from family. Acute OT to continue to follow.    Recommendations for follow up therapy are one component of a multi-disciplinary discharge planning process, led by the attending physician.  Recommendations may be updated based on patient status, additional functional criteria and insurance authorization.    Follow Up Recommendations  Acute inpatient rehab (3hours/day)     Assistance Recommended at Discharge Frequent or constant Supervision/Assistance  Patient can return home with the following  A little help with walking and/or transfers;A little help with bathing/dressing/bathroom;Assistance with cooking/housework;Assist for transportation;Help with stairs or ramp for entrance   Equipment Recommendations  BSC/3in1    Recommendations for Other Services      Precautions /  Restrictions Precautions Precautions: Fall Precaution Comments: patient able to recall precautions Required Braces or Orthoses: Other Brace Other Brace: cam boot LLE and post op shoe on the right Restrictions Weight Bearing Restrictions: Yes RLE Weight Bearing: Weight bearing as tolerated LLE Weight Bearing: Touchdown weight bearing       Mobility Bed Mobility Overal bed mobility: Modified Independent Bed Mobility: Supine to Sit, Sit to Supine     Supine to sit: Supervision Sit to supine: Supervision   General bed mobility comments: able to get to EOB without assistance    Transfers Overall transfer level: Needs assistance Equipment used: Rolling walker (2 wheels) Transfers: Sit to/from Stand Sit to Stand: Min assist     Step pivot transfers: Min assist     General transfer comment: min assist from EOB and toilet for sit to stand with use or bath rails to assist with standing     Balance Overall balance assessment: Needs assistance Sitting-balance support: Feet supported Sitting balance-Leahy Scale: Good     Standing balance support: Bilateral upper extremity supported, Single extremity supported, During functional activity Standing balance-Leahy Scale: Poor Standing balance comment: used RW or sink for support when standing with one extremity support. able to tolerate 3 minutes of standing with one extremity reaching tasks                           ADL either performed or assessed with clinical judgement   ADL Overall ADL's : Needs assistance/impaired     Grooming: Wash/dry hands;Wash/dry face;Min guard;Standing Grooming Details (indicate cue type and reason): used sink for support  Toilet Transfer: Minimal assistance;Ambulation;Regular Toilet;Rolling walker (2 wheels) Toilet Transfer Details (indicate cue type and reason): min assist for balance and safety to ambulate to bathroom  and perform tranfers Toileting- Clothing  Manipulation and Hygiene: Supervision/safety;Sitting/lateral lean         General ADL Comments: min assist for raised bed and instructions on sit to stand from toilet using bath rails    Extremity/Trunk Assessment              Vision       Perception     Praxis      Cognition Arousal/Alertness: Awake/alert Behavior During Therapy: WFL for tasks assessed/performed Overall Cognitive Status: History of cognitive impairments - at baseline                                 General Comments: able to recall WB precautions        Exercises      Shoulder Instructions       General Comments      Pertinent Vitals/ Pain       Pain Assessment Pain Assessment: No/denies pain Pain Intervention(s): Monitored during session  Home Living                                          Prior Functioning/Environment              Frequency  Min 2X/week        Progress Toward Goals  OT Goals(current goals can now be found in the care plan section)  Progress towards OT goals: Progressing toward goals  Acute Rehab OT Goals Patient Stated Goal: get better OT Goal Formulation: With patient Time For Goal Achievement: 12/07/22 Potential to Achieve Goals: Good ADL Goals Pt Will Perform Grooming: with supervision;standing Pt Will Perform Lower Body Bathing: with supervision;sit to/from stand Pt Will Perform Lower Body Dressing: with supervision;sit to/from stand Pt Will Transfer to Toilet: ambulating;with supervision;bedside commode Pt Will Perform Toileting - Clothing Manipulation and hygiene: with supervision;sit to/from stand  Plan Discharge plan remains appropriate    Co-evaluation                 AM-PAC OT "6 Clicks" Daily Activity     Outcome Measure   Help from another person eating meals?: None Help from another person taking care of personal grooming?: A Little Help from another person toileting, which includes using  toliet, bedpan, or urinal?: A Lot Help from another person bathing (including washing, rinsing, drying)?: A Little Help from another person to put on and taking off regular upper body clothing?: None Help from another person to put on and taking off regular lower body clothing?: A Little 6 Click Score: 19    End of Session Equipment Utilized During Treatment: Gait belt;Rolling walker (2 wheels)  OT Visit Diagnosis: Unsteadiness on feet (R26.81);Muscle weakness (generalized) (M62.81);Other abnormalities of gait and mobility (R26.89)   Activity Tolerance Patient tolerated treatment well   Patient Left in bed;with call bell/phone within reach;with bed alarm set   Nurse Communication Mobility status        Time: 1610-9604 OT Time Calculation (min): 24 min  Charges: OT General Charges $OT Visit: 1 Visit OT Treatments $Self Care/Home Management : 8-22 mins $Therapeutic Activity: 8-22 mins  Lodema Hong, [place]  Office [phone]  Dewain Penning 11/26/2022, 11:33 AM

## 2022-11-27 ENCOUNTER — Encounter (HOSPITAL_COMMUNITY): Payer: Self-pay | Admitting: Vascular Surgery

## 2022-11-27 LAB — COMPREHENSIVE METABOLIC PANEL
ALT: 16 U/L (ref 0–44)
AST: 17 U/L (ref 15–41)
Albumin: 2.7 g/dL — ABNORMAL LOW (ref 3.5–5.0)
Alkaline Phosphatase: 49 U/L (ref 38–126)
Anion gap: 7 (ref 5–15)
BUN: 15 mg/dL (ref 6–20)
CO2: 21 mmol/L — ABNORMAL LOW (ref 22–32)
Calcium: 9 mg/dL (ref 8.9–10.3)
Chloride: 107 mmol/L (ref 98–111)
Creatinine, Ser: 0.87 mg/dL (ref 0.61–1.24)
GFR, Estimated: >60 mL/min (ref >60)
Glucose, Bld: 141 mg/dL — ABNORMAL HIGH (ref 70–99)
Potassium: 4 mmol/L (ref 3.5–5.1)
Sodium: 135 mmol/L (ref 135–145)
Total Bilirubin: 0.3 mg/dL (ref 0.3–1.2)
Total Protein: 6.9 g/dL (ref 6.5–8.1)

## 2022-11-27 LAB — GLUCOSE, CAPILLARY
Glucose-Capillary: 162 mg/dL — ABNORMAL HIGH (ref 70–99)
Glucose-Capillary: 111 mg/dL — ABNORMAL HIGH (ref 70–99)
Glucose-Capillary: 147 mg/dL — ABNORMAL HIGH (ref 70–99)
Glucose-Capillary: 207 mg/dL — ABNORMAL HIGH (ref 70–99)

## 2022-11-27 LAB — CBC
HCT: 31.3 % — ABNORMAL LOW (ref 39.0–52.0)
Hemoglobin: 9.9 g/dL — ABNORMAL LOW (ref 13.0–17.0)
MCH: 26 pg (ref 26.0–34.0)
MCHC: 31.6 g/dL (ref 30.0–36.0)
MCV: 82.2 fL (ref 80.0–100.0)
Platelets: 275 10*3/uL (ref 150–400)
RBC: 3.81 MIL/uL — ABNORMAL LOW (ref 4.22–5.81)
RDW: 15.5 % (ref 11.5–15.5)
WBC: 7 10*3/uL (ref 4.0–10.5)
nRBC: 0 % (ref 0.0–0.2)

## 2022-11-27 LAB — LIPID PANEL
Cholesterol: 199 mg/dL (ref 0–200)
HDL: 21 mg/dL — ABNORMAL LOW (ref >40)
LDL Cholesterol: 141 mg/dL — ABNORMAL HIGH (ref 0–99)
Total CHOL/HDL Ratio: 9.5 RATIO
Triglycerides: 185 mg/dL — ABNORMAL HIGH (ref <150)
VLDL: 37 mg/dL (ref 0–40)

## 2022-11-27 LAB — SURGICAL PATHOLOGY

## 2022-11-27 MED ORDER — ROSUVASTATIN CALCIUM 20 MG PO TABS
20.0000 mg | ORAL_TABLET | Freq: Every day | ORAL | Status: DC
  Administered 2022-11-27 – 2022-12-03 (×7): 20 mg via ORAL
  Filled 2022-11-27 (×7): qty 1

## 2022-11-27 MED ORDER — RIVAROXABAN 10 MG PO TABS
10.0000 mg | ORAL_TABLET | Freq: Every day | ORAL | Status: DC
  Administered 2022-11-27 – 2022-12-02 (×6): 10 mg via ORAL
  Filled 2022-11-27 (×6): qty 1

## 2022-11-27 NOTE — Progress Notes (Signed)
PHARMACIST LIPID MONITORING   Carl Conley is a 37 y.o. male admitted on 11/17/2022 with foot infection, found to have vascular disease.  Pharmacy has been consulted to optimize lipid-lowering therapy with the indication of secondary prevention for clinical ASCVD.  Recent Labs:  Lipid Panel (last 6 months):   Lab Results  Component Value Date   CHOL 199 11/27/2022   TRIG 185 (H) 11/27/2022   HDL 21 (L) 11/27/2022   CHOLHDL 9.5 11/27/2022   VLDL 37 11/27/2022   LDLCALC 141 (H) 11/27/2022    Hepatic function panel (last 6 months):   Lab Results  Component Value Date   AST 17 11/27/2022   ALT 16 11/27/2022   ALKPHOS 49 11/27/2022   BILITOT 0.3 11/27/2022    SCr (since admission):   Serum creatinine: 0.87 mg/dL 11/27/22 0537 Estimated creatinine clearance: 140.6 mL/min  Current therapy and lipid therapy tolerance Current lipid-lowering therapy: none Documented or reported allergies or intolerances to lipid-lowering therapies (if applicable): none   Plan:    1.Statin intensity (high intensity recommended for all patients regardless of the LDL):  Add or increase statin to high intensity.   Begin rosuvastatin 20mg  daily  and recheck lipid panel in 8-12 weeks with plans to up titrate statin to goal LDL < 70  May need to add zetia in future but will begin with single therapy.   Bonnita Nasuti Pharm.D. CPP, BCPS Clinical Pharmacist [phone] 11/27/2022 3:24 PM

## 2022-11-27 NOTE — Progress Notes (Signed)
Inpatient Rehab Coordinator Note:  I met with patient at bedside to discuss CIR recommendations and goals/expectations of CIR stay.  We reviewed 3 hrs/day of therapy, physician follow up, and average length of stay 2 weeks (dependent upon progress) with goals of supervision.  Pt reports he resides with his mother, who is able to care for herself but not provide physical assist for him.  There are stairs to enter the home, which is pt's primary barrier to safe discharge at this time.  We discussed CIR versus SNF.  He feels he will need to be able to bear weight on LLE to be able to safely access his home, and per Dr. Loel Lofty, NWB restrictions are to last at minimum 3 weeks.  I called the number in his chart for his mother, but no answer and not able to leave a voicemail.  We discussed challenges with SNF placement/payor source, but this may be his best option.    Shann Medal, PT, DPT Admissions Coordinator [phone] 11/27/22  1:09 PM

## 2022-11-27 NOTE — Progress Notes (Addendum)
  Progress Note    11/27/2022 7:27 AM 1 Day Post-Op  Subjective:  no complaints   Vitals:   11/27/22 0030 11/27/22 0552  BP: 104/64 106/66  Pulse: (!) 58 64  Resp: 16 19  Temp: 98 F (36.7 C) 97.9 F (36.6 C)  SpO2: 97% 97%   Physical Exam: Cardiac:  regular Lungs:  non labored Extremities:  right common femoral access site c/d/I without swelling or hematoma. Feet dressed Neurologic: alert and oriented  CBC    Component Value Date/Time   WBC 7.0 11/27/2022 0537   RBC 3.81 (L) 11/27/2022 0537   HGB 9.9 (L) 11/27/2022 0537   HGB 16.8 02/04/2020 1406   HCT 31.3 (L) 11/27/2022 0537   HCT 47.6 02/04/2020 1406   PLT 275 11/27/2022 0537   PLT 255 02/04/2020 1406   MCV 82.2 11/27/2022 0537   MCV 83 02/04/2020 1406   MCH 26.0 11/27/2022 0537   MCHC 31.6 11/27/2022 0537   RDW 15.5 11/27/2022 0537   RDW 12.8 02/04/2020 1406   LYMPHSABS 2.3 11/20/2022 0354   MONOABS 0.8 11/20/2022 0354   EOSABS 0.5 11/20/2022 0354   BASOSABS 0.0 11/20/2022 0354    BMET    Component Value Date/Time   NA 135 11/27/2022 0537   NA 138 07/17/2021 1408   K 4.0 11/27/2022 0537   CL 107 11/27/2022 0537   CO2 21 (L) 11/27/2022 0537   GLUCOSE 141 (H) 11/27/2022 0537   BUN 15 11/27/2022 0537   BUN 16 07/17/2021 1408   CREATININE 0.87 11/27/2022 0537   CREATININE 0.78 02/12/2012 2053   CALCIUM 9.0 11/27/2022 0537   GFRNONAA >60 11/27/2022 0537   GFRAA 112 02/04/2020 1406    INR No results found for: "INR"   Intake/Output Summary (Last 24 hours) at 11/27/2022 0727 Last data filed at 11/27/2022 2440 Gross per 24 hour  Intake 0 ml  Output 3420 ml  Net -3420 ml     Assessment/Plan:  37 y.o. male is s/p Aortogram BLE 1 Day Post-Op   Right common femoral acces site c/d/I without swelling or hematoma Normal arteries in bilateral lower extremities to level of feet. Significant small vessel disease without intact pedal arches Due to his poorly controlled diabetes and small vessel  disease suspect he will have challenge with wound healing but no revascularization options Foot wound management per podiatry Vascular will be available as needed   [facility] [phone] 11/27/2022 7:27 AM  I agree with the above.  No correctable vascular disease.  Mostly foot disease.  Vascular will sign off.  Please call with questions  Annamarie Major

## 2022-11-27 NOTE — Progress Notes (Signed)
Mobility Specialist - Progress Note   11/27/22 1401  Mobility  Activity Ambulated with assistance to bathroom  Level of Assistance Contact guard assist, steadying assist  Assistive Device Front wheel walker  Distance Ambulated (ft) 20 ft  Activity Response Tolerated well  Mobility Referral Yes  $Mobility charge 1 Mobility   Pt received in EOB needing assistance to the BR. No complaints throughout. Pt returned to bed with all needs met.   Franki Monte  Mobility Specialist Please contact via Solicitor or Rehab office at [phone]

## 2022-11-27 NOTE — Progress Notes (Addendum)
Subjective:  NAEON.  Angiogram yesterday with no interventions. Recovering well, no hematoma or acute concerns. Tolerating PO intake without issues, no n/v, passing flatus. NWB LLE.  Ongoing dispo discussion.  Objective:  Vital signs in last 24 hours: Vitals:   11/26/22 1830 11/26/22 2030 11/27/22 0030 11/27/22 0552  BP: 108/74 103/84 104/64 106/66  Pulse: 64 64 (!) 58 64  Resp: 18 20 16 19   Temp:  97.9 F (36.6 C) 98 F (36.7 C) 97.9 F (36.6 C)  TempSrc:  Oral Oral Oral  SpO2: 100% 99% 97% 97%  Weight:  98.9 kg    Height:       Physical Exam: General: Awake and alert, not in acute distress Cardiac: RRR, no MRG Respiratory: CTAB, normal work of breathing on room air Extremities: Bilateral feet covered in dressings. S/p amputation of R 2nd toe at MTP joint and L TMA.  Neuro: Alert and oriented x4.    Assessment/Plan:  Principal Problem:   Diabetic osteomyelitis ([place]) Active Problems:   Osteomyelitis of left foot ([facility])  Carl Conley is a 37 year old man with a PMHx significant for T2DM, NAFLD, ADHD, and Depression who presents for bilateral foot wounds and admitted for management of bilateral osteomyelitis.    Bilateral Feet Osteomyelitis 2/2 to diabetic neuropathy Status post bilateral partial lower extremity amputations Presented w/ worsening bilateral foot wounds over the last 6 months. Previously seen in clinic, started on augmentin/doxycycline w/o resolution. Feet XRAY from 1/11 demonstrate bony destruction of the second toes bilaterally, and bony irregularity and periosteal reaction of the left metatarsal shaft consistent with osteomyelitis. CRP elevated at 1.1. ESR elevated at 49. MRI of L/R foot on admission with osteo.  The patient is now s\p amputation of R 2nd toe at MTP joint, R distal fibula biopsy, debridement of R ankle, L TMA. Cultures thus far are growing MSSA and pseudomonas. R distal fibula bx also showing osteo, discussed with vascular and podiatry  and they recommended attempting conservative management with abx and followup surgery only if necessary.  ID recommending outpatient IV cefepime, PICC line was placed and OPAT orders placed.  Angiogram shows sufficiently patent vasculature with no complications. Currently pending dispo planning. - appreciate podiatry, vascular, ID recs - plan for f/u ABI in 1 year  - Continue IV Cefepime per pharmacy.  PICC line and OPAT orders placed.  Needs outpatient ID follow-up. - Trend CBC - Scheduled Tylenol 1000 3 times daily, oxycodone 10 mg prn every 8 for severe pain -T/OT recommended CIR.  I think this would be a good option for him as well given his prior good baseline function before current hospitalization.   Hyperkalemia (resolved) Mild hyponatremia (resolved)   T2DM HLD A1c 6.5% from 10/2022.  On Invokamet 150-1000mg  BID at home. CBGs largely well managed postop. LDL 141 this morning with peripheral small vessel disease. Should be on statin. I see he was possibly previously started on one and discontinued but unknown reason, will discuss with him whether he had statin rxn and start as appropriate. -start statin if appropriate, LDL goal < 70 -Hold home Invokamet -SSI  -Trend CBGs, maintain < 180 postop   Hx of depression Chronic condition. Takes citalopram at home.  -Continue home citalopram 20mg    Hx of HSV keratitis Diagnosed in 2017. Seen by Dr. Katy Fitch, ophthalmology. Takes acyclovir at home.  -Continue home acyclovir 400mg  BID   DVT Prophylaxis: Xarelto Diet: Carb modified,  Bowel: miralax PRN IVF: none Code: Full   Prior  to Admission Living Arrangement: home w/ mother Anticipated Discharge Location: TBD Barriers to Discharge: continued management Dispo: Anticipated discharge in approximately more than 2 day(s).   [name], MD 11/27/2022, 6:35 AM Pager: [phone] After 5pm on weekdays and 1pm on weekends: On Call pager [phone]

## 2022-11-27 NOTE — Progress Notes (Signed)
Physical Therapy Treatment Patient Details Name: Carl Conley MRN: [id number] DOB: 1986-07-10 Today's Date: 11/27/2022   History of Present Illness 37 y/o male presents with B foot wounds 11/17/21 diagnosed with osteomyelitis B feet with MRI .  Underwent S/P right second toe amputation, debridement and bone biopsy of the later malleolus wound right  LE.   Left foot also underwent initial partial 2nd ray amputation, 3rd metatarsal head bone biopsy, and 1st metatarsal head bone biopsy.  Transmetatarsal amputation of the left foot completed 1/18.  PMH DM visual snow syndrome, corneal ulcer of the right eye.    PT Comments    Patient received in bed, he is agreeable to PT session. Patient is interested in CIR at this time as he is unable to get up/down steps and is requiring min A for ambulation and safety. A needed to don cam boot due to patients decreased vision. He requires min A for sit to stand from significantly elevated bed.  Patient ambulated 80 feet with RW and cga. Poor balance and decreased activity tolerance with mobility. He will continue to benefit from skilled PT to improve strength, endurance and safety.      Recommendations for follow up therapy are one component of a multi-disciplinary discharge planning process, led by the attending physician.  Recommendations may be updated based on patient status, additional functional criteria and insurance authorization.  Follow Up Recommendations  Acute inpatient rehab (3hours/day)     Assistance Recommended at Discharge    Patient can return home with the following A little help with walking and/or transfers;Help with stairs or ramp for entrance;Assist for transportation;A little help with bathing/dressing/bathroom   Equipment Recommendations  Rolling walker (2 wheels);Wheelchair (measurements PT);Wheelchair cushion (measurements PT)    Recommendations for Other Services Rehab consult     Precautions / Restrictions  Precautions Precautions: Fall Required Braces or Orthoses: Other Brace Other Brace: cam boot LLE and post op shoe on the right Restrictions Weight Bearing Restrictions: Yes RLE Weight Bearing: Weight bearing as tolerated LLE Weight Bearing: Non weight bearing     Mobility  Bed Mobility Overal bed mobility: Independent Bed Mobility: Supine to Sit, Sit to Supine     Supine to sit: Independent Sit to supine: Independent   General bed mobility comments: able to get to EOB without assistance    Transfers Overall transfer level: Needs assistance Equipment used: Rolling walker (2 wheels) Transfers: Sit to/from Stand Sit to Stand: From elevated surface, Min guard           General transfer comment: cga from elevated bed.    Ambulation/Gait Ambulation/Gait assistance: Min guard Gait Distance (Feet): 80 Feet Assistive device: Rolling walker (2 wheels) Gait Pattern/deviations: Step-to pattern, Decreased step length - right, Trunk flexed Gait velocity: decr     General Gait Details: Pt demonstrates good adherence to NWB precautions. Multi directional sway which is why pt is requiring cga. HR up to 130 with ambulation. Post op shoe on right is not the safest option as it is not very secure and tends to slide   Stairs             Wheelchair Mobility    Modified Rankin (Stroke Patients Only)       Balance Overall balance assessment: Needs assistance Sitting-balance support: Feet unsupported, No upper extremity supported Sitting balance-Leahy Scale: Normal     Standing balance support: Bilateral upper extremity supported, During functional activity, Reliant on assistive device for balance Standing balance-Leahy Scale: Poor Standing balance  comment: unsteady with mobility due to L LE NWB.                            Cognition Arousal/Alertness: Awake/alert Behavior During Therapy: WFL for tasks assessed/performed Overall Cognitive Status: History of  cognitive impairments - at baseline                                 General Comments: able to recall WB precautions        Exercises      General Comments        Pertinent Vitals/Pain Pain Assessment Pain Assessment: No/denies pain    Home Living                          Prior Function            PT Goals (current goals can now be found in the care plan section) Acute Rehab PT Goals Patient Stated Goal: to return home after rehab PT Goal Formulation: With patient Time For Goal Achievement: 12/07/22 Potential to Achieve Goals: Good Additional Goals Additional Goal #1: Patient to propel wheelchair 150' S. Progress towards PT goals: Progressing toward goals    Frequency    Min 3X/week      PT Plan Current plan remains appropriate    Co-evaluation              AM-PAC PT "6 Clicks" Mobility   Outcome Measure  Help needed turning from your back to your side while in a flat bed without using bedrails?: None Help needed moving from lying on your back to sitting on the side of a flat bed without using bedrails?: None Help needed moving to and from a bed to a chair (including a wheelchair)?: A Little Help needed standing up from a chair using your arms (e.g., wheelchair or bedside chair)?: A Lot Help needed to walk in hospital room?: A Little Help needed climbing 3-5 steps with a railing? : Total 6 Click Score: 17    End of Session Equipment Utilized During Treatment: Gait belt Activity Tolerance: Patient tolerated treatment well;Patient limited by fatigue Patient left: in bed;with call bell/phone within reach Nurse Communication: Mobility status PT Visit Diagnosis: Other abnormalities of gait and mobility (R26.89);Difficulty in walking, not elsewhere classified (R26.2);Unsteadiness on feet (R26.81)     Time: 0960-4540 PT Time Calculation (min) (ACUTE ONLY): 14 min  Charges:  $Gait Training: 8-22 mins                      Tirza Senteno, PT, GCS 11/27/22,10:43 AM

## 2022-11-28 LAB — GLUCOSE, CAPILLARY
Glucose-Capillary: 164 mg/dL — ABNORMAL HIGH (ref 70–99)
Glucose-Capillary: 128 mg/dL — ABNORMAL HIGH (ref 70–99)
Glucose-Capillary: 162 mg/dL — ABNORMAL HIGH (ref 70–99)
Glucose-Capillary: 171 mg/dL — ABNORMAL HIGH (ref 70–99)
Glucose-Capillary: 154 mg/dL — ABNORMAL HIGH (ref 70–99)
Glucose-Capillary: 126 mg/dL — ABNORMAL HIGH (ref 70–99)

## 2022-11-28 NOTE — Progress Notes (Signed)
PODIATRY PROGRESS NOTE Patient Name: Carl Conley  DOB [birth year] DOA 11/17/2022  Hospital Day: 55  Assessment:  37 y.o. male with PMHx significant for  T2DM with bilateral foot ulcerations and osteomyelitis, Right foot with ankle ulceration to subcatenous fat tissue without evidence of underlying OM, and 2nd toe ulceration with chronic OM. Left foot with 2nd toe ulceration with chronic OM of the 2nd ray as well as ulceration plantar 5th met head.    POD 9 s/p left foot partial second ray amputation and third and first metatarsal bone biopsy, right foot second digit amputation and right ankle wound debridement and distal fibula bone biopsy progressing well  POD 12 s/p L foot TMA, TAL:  AF, VSS  Wound/Bone Cultures: Bone biopsy distal fibula right: Osteomyelitis acute; right second toe: Acute osteomyelitis; left second ray acute osteomyelitis; bone left third metatarsal head: Focal mild acute inflammation; first metatarsal bone biopsy chronic and focal acute inflammation  OR Cultures Staph aureus and Pseudomonas  Imaging:   MRI R foot: 1. Acute OM 2nd toe. 2. Possible OM 2nd met head vs reactive change. 3. Bone marrow edema lateral malleolus possible OM.    MRI L foot: OM 2nd ray, 3rd ray, possible 1st ray, possible OM 5th met head and proximal phalanx.   XR R foot: Bony destruction of the middle phalanx of the second toe and irregularity of the tuft of the distal phalanx concerning for osteomyelitis. There is overlying soft tissue defect.   XR R ankle: No acute fx or erosions, soft tissue edema   XR L foot: Extensive bony destruction involving the left second toe throughout the distal, middle and proximal phalanx and head of the second metatarsal compatible with osteomyelitis. There is pronounced periosteal reaction and bony irregularity of the metatarsal shaft concerning for additional involvement.  Plan:  -Pt progressing well, amputation sites healing without complication  at this time. Pending placement.  - Dsg changed. Next change This weekend.  -Appreciate ID recommendations, IV cefepime outpatient, PICC placed - Anticoagulation: per primary - WB status: NWB to LLE in CAM boot, WBAT to RLE in post op shoe -Clear for DC pending placement in inpatient rehab per CM Will continue to follow, f/u 1 week from discharge        Everitt Amber, [place]    Subjective:  Pt seen this AM, states he is doing well no concerns, Clarified timeline for weightbearing and suture removal. Denies pain. Has been NWB to LLE in CAM boot.   Objective:   Vitals:   11/28/22 0000 11/28/22 0500  BP: 106/61 121/70  Pulse: (!) 57 (!) 59  Resp: 18 16  Temp: 98 F (36.7 C) 97.9 F (36.6 C)  SpO2: 96% 98%       Latest Ref Rng & Units 11/27/2022    5:37 AM 11/26/2022    9:13 PM 11/24/2022    3:47 AM  CBC  WBC 4.0 - 10.5 K/uL 7.0  7.2  8.1   Hemoglobin 13.0 - 17.0 g/dL 9.9  9.8  10.1   Hematocrit 39.0 - 52.0 % 31.3  31.8  30.4   Platelets 150 - 400 K/uL 275  277  214        Latest Ref Rng & Units 11/27/2022    5:37 AM 11/26/2022    9:13 PM 11/24/2022    3:47 AM  BMP  Glucose 70 - 99 mg/dL 141   163   BUN 6 - 20  mg/dL 15   18   Creatinine 0.61 - 1.24 mg/dL 0.87  0.98  0.94   Sodium 135 - 145 mmol/L 135   135   Potassium 3.5 - 5.1 mmol/L 4.0   3.8   Chloride 98 - 111 mmol/L 107   104   CO2 22 - 32 mmol/L 21   21   Calcium 8.9 - 10.3 mg/dL 9.0   8.9     General: AAOx3, NAD  Lower Extremity Exam Lower Extremity Exam Vasc:     R - PT palpable, DP palpable. Cap refill < 3 sec to digits aside from 2nd toe               L - PT palpable, DP palpable. Cap refill <3 sec to digits   Derm:    R - 2nd toe amputation healing well. Integra graft in place no evidence residual infection                      L - Amputation site healing well with no necrosis, dehisence or signs of residual infeciton      MSK:     R -status post right second toe  amputation right ankle wound debridement               L -status post Trans met amputation   Neuro:   R - Gross sensation absent to forefoot. Gross motor function intact                    L - Gross sensation absent to forefoot. Gross motor function intact   Radiology:  Results reviewed. See assessment for pertinent imaging results

## 2022-11-28 NOTE — Progress Notes (Signed)
Inpatient Rehab Admissions Coordinator:   Attempted to reach mother again via phone ("the number you have reached is unavailable"), and via text.  No response as of yet.    Shann Medal, PT, DPT Admissions Coordinator [phone] 11/28/22  12:20 PM

## 2022-11-28 NOTE — Progress Notes (Signed)
Mobility Specialist - Progress Note    11/28/22 1327  Mobility  Activity Ambulated with assistance in room  Level of Assistance Contact guard assist, steadying assist  Assistive Device Front wheel walker  Distance Ambulated (ft) 20 ft  Activity Response Tolerated well  Mobility Referral Yes  $Mobility charge 1 Mobility   Pt received in bed and agreeable to mobility. No complaints throughout. Pt was returned to bed with all needs met.  Franki Monte  Mobility Specialist Please contact via Solicitor or Rehab office at [phone]

## 2022-11-28 NOTE — Progress Notes (Signed)
Subjective:  NAEON.  Recovering well, no hematoma or acute concerns. Tolerating PO intake without issues, no n/v, passing flatus. NWB LLE.   Ongoing dispo discussion, if he can't get a CIR bed he is OK with SNF, but he has a strong preference for CIR. I also believe he will benefit from CIR if possible, though he does not have strong social support.  Objective:  Vital signs in last 24 hours: Vitals:   11/27/22 1627 11/27/22 1930 11/28/22 0000 11/28/22 0500  BP: 126/72 114/70 106/61 121/70  Pulse: 64 76 (!) 57 (!) 59  Resp: 20 19 18 16   Temp: 98.5 F (36.9 C) 98 F (36.7 C) 98 F (36.7 C) 97.9 F (36.6 C)  TempSrc: Oral Oral Oral Oral  SpO2: 98%  96% 98%  Weight:      Height:       Physical Exam: General: Awake and alert, not in acute distress Cardiac: RRR, no MRG Respiratory: CTAB, normal work of breathing on room air Extremities: Bilateral feet covered in dressings. S/p amputation of R 2nd toe at MTP joint and L TMA. No hematoma over R groin access site. Neuro: Alert and oriented x4.    Assessment/Plan:  Principal Problem:   Diabetic osteomyelitis ([place]) Active Problems:   Osteomyelitis of left foot (HCC)  Carl Conley is a 37 year old man with a PMHx significant for T2DM, NAFLD, ADHD, and Depression who presents for bilateral foot wounds and admitted for management of bilateral osteomyelitis.    Bilateral Feet Osteomyelitis 2/2 to diabetic neuropathy Status post bilateral partial lower extremity amputations Presented w/ worsening bilateral foot wounds over the last 6 months. Previously seen in clinic, started on augmentin/doxycycline w/o resolution. Feet XRAY from 1/11 demonstrate bony destruction of the second toes bilaterally, and bony irregularity and periosteal reaction of the left metatarsal shaft consistent with osteomyelitis. CRP elevated at 1.1. ESR elevated at 49. MRI of L/R foot on admission with osteo.  The patient is now s\p amputation of R 2nd toe at MTP  joint, R distal fibula biopsy, debridement of R ankle, L TMA. Cultures thus far are growing MSSA and pseudomonas. R distal fibula bx also showing osteo, discussed with vascular and podiatry and they recommended attempting conservative management with abx and followup surgery only if necessary.  ID recommending outpatient IV cefepime, PICC line was placed and OPAT orders placed.  Angiogram shows sufficiently patent vasculature with no complications. Currently pending dispo planning. - appreciate podiatry, vascular, ID recs - plan for f/u ABI in 1 year  - Continue IV Cefepime per pharmacy.  PICC line and OPAT orders placed.  Needs outpatient ID follow-up. - Trend CBC - Scheduled Tylenol 1000 3 times daily, oxycodone 10 mg prn every 8 for severe pain -PT/OT recommended CIR.  I think this would be a good option for him as well given his prior good baseline function before current hospitalization.   Hyperkalemia (resolved) Mild hyponatremia (resolved)   T2DM HLD A1c 6.5% from 10/2022.  On Invokamet 150-1000mg  BID at home. CBGs largely well managed postop. LDL 141 this morning with peripheral small vessel disease. Started on crestor 20. -Crestor 20, LDL goal < 70 -Hold home Invokamet -SSI  -Trend CBGs, maintain < 180 postop   Hx of depression Chronic condition. Takes citalopram at home.  -Continue home citalopram 20mg    Hx of HSV keratitis Diagnosed in 2017. Seen by Dr. Katy Fitch, ophthalmology. Takes acyclovir at home.  -Continue home acyclovir 400mg  BID   DVT Prophylaxis: Xarelto  Diet: Carb modified,  Bowel: miralax PRN IVF: none Code: Full   Prior to Admission Living Arrangement: home w/ mother Anticipated Discharge Location: TBD Barriers to Discharge: continued management Dispo: Anticipated discharge in approximately more than 2 day(s).   Carl Galas, MD 11/28/2022, 6:40 AM Pager: [phone] After 5pm on weekdays and 1pm on weekends: On Call pager [phone]

## 2022-11-28 NOTE — Progress Notes (Signed)
Transferred to 2W16. Patient is aware about the transfer and agreed.

## 2022-11-29 LAB — GLUCOSE, CAPILLARY
Glucose-Capillary: 156 mg/dL — ABNORMAL HIGH (ref 70–99)
Glucose-Capillary: 97 mg/dL (ref 70–99)
Glucose-Capillary: 162 mg/dL — ABNORMAL HIGH (ref 70–99)
Glucose-Capillary: 161 mg/dL — ABNORMAL HIGH (ref 70–99)

## 2022-11-29 NOTE — Progress Notes (Signed)
Inpatient Rehab Admissions Coordinator:   Discussed with rehab medical director, Dr. Naaman Plummer.  Pt not a candidate for CIR at this time due to no caregiver support.  Will sign off.    Shann Medal, PT, DPT Admissions Coordinator [phone] 11/29/22  10:46 AM

## 2022-11-29 NOTE — TOC Progression Note (Signed)
Transition of Care [facility]) - Progression Note    Patient Details  Name: Carl Conley MRN: [id number] Date of Birth: 1986-01-18  Transition of Care [facility]) CM/SW Contact  Jinger Neighbors, [place] Phone Number: 11/29/2022, 9:22 AM  Clinical Narrative:     CSW met with patient at bedside to discuss options for treatment post hospitalization. CSW discussed insurance barriers and cost of treatment for SNF. Pt reports his funds are tight and can not pay out of pocket for SNF. CSW discussed CIR, in which pt appears agreeable. CSW updated tx team.   Expected Discharge Plan: [place] Barriers to Discharge: Continued Medical Work up  Expected Discharge Plan and Services In-house Referral: Clinical Social Work Discharge Planning Services: CM Consult Post Acute Care Choice: NA Living arrangements for the past 2 months: Single Family Home                 DME Arranged: Other see comment (iv abx therapy) DME Agency: Other - Comment ([facility])       HH Arranged: RN [place] Agency: [facility]) Date [place]: 11/23/22 Time [place]: [place] Representative spoke with at [place]: [place] Determinants of Health ([place]) Interventions SDOH Screenings   Food Insecurity: No Food Insecurity (11/26/2022)  Housing: Low Risk  (11/26/2022)  Transportation Needs: No Transportation Needs (11/26/2022)  Utilities: Not At Risk (11/26/2022)  Depression (PHQ2-9): Low Risk  (07/17/2021)  Tobacco Use: Low Risk  (11/27/2022)    Readmission Risk Interventions     No data to display

## 2022-11-29 NOTE — TOC Progression Note (Signed)
Transition of Care [facility]) - Progression Note    Patient Details  Name: Carl Conley MRN: [id number] Date of Birth: [birth year]  Transition of Care [facility]) CM/SW [place], RN Phone Number: 11/29/2022, 1:03 PM  Clinical Narrative:     Complex situation as the patient needs IV abx,however he does not have any support at home( lives with mother, she has dementia, he takes care of her). CIR- deemed that he cannot got here because he does not have support at home, he is NWB for 3 weeks.  Has a PICC and needs IV abx.  Messged Director about LOG for possible SNF. Messaged with Pam from [employer] updating her on the situation.   Plan:  Possible LOG for SNF Have [facility] RN set  with [name] for IV abx Continue to follow  Expected Discharge Plan: Home/Self Care Barriers to Discharge: Continued Medical Work up  Expected Discharge Plan and Services In-house Referral: Clinical Social Work Discharge Planning Services: CM Consult Post Acute Care Choice: NA Living arrangements for the past 2 months: Single Family Home                 DME Arranged: Walker rolling DME Agency: [employer] Date DME Agency Contacted: 11/29/22 Time DME Agency Contacted: [id number] Representative spoke with at DME Agency: [place]: RN [place]: [facility] Date [place]: 11/23/22 Time [place]: [place] Representative spoke with at [place]: [place] ([place]) Interventions SDOH Screenings   Food Insecurity: No Food Insecurity (11/26/2022)  Housing: Low Risk  (11/26/2022)  Transportation Needs: No Transportation Needs (11/26/2022)  Utilities: Not At Risk (11/26/2022)  Depression (PHQ2-9): Low Risk  (07/17/2021)  Tobacco Use: Low Risk  (11/27/2022)    Readmission Risk Interventions     No data to display

## 2022-11-29 NOTE — Progress Notes (Addendum)
Occupational Therapy Treatment Patient Details Name: Carl Conley MRN: [id number] DOB: Dec 08, 1985 Today's Date: 11/29/2022   History of present illness 37 y/o male presents with B foot wounds 11/17/21 diagnosed with osteomyelitis B feet with MRI .  Underwent S/P right second toe amputation, debridement and bone biopsy of the later malleolus wound right  LE.   Left foot also underwent initial partial 2nd ray amputation, 3rd metatarsal head bone biopsy, and 1st metatarsal head bone biopsy.  Transmetatarsal amputation of the left foot completed 1/18.  PMHx: DM, visual snow syndrome, corneal ulcer of the right eye.   OT comments  Patient continues to make gains with OT treatment with patient currently independent with bed mobility. Patient performed dressing with assistance to pull up underwear while standing and max assist to donn cam boot on LLE. Patient able to stand at sink for grooming and maintain WB precautions with use of sink for support. Patient continues to be min assist for stability with transfers. Discharge recommendations changed to [facility] due to not appropriate for AIR.  Patient's mother is able to assist with donning CAM boot and IADLs.  Patient would benefit from [facility] to address independence with donning CAM boot, dressing, and functional transfers. Acute OT to continue to follow for self care and functional transfers.    Recommendations for follow up therapy are one component of a multi-disciplinary discharge planning process, led by the attending physician.  Recommendations may be updated based on patient status, additional functional criteria and insurance authorization.    Follow Up Recommendations   Home Health OT     Assistance Recommended at Discharge Frequent or constant Supervision/Assistance  Patient can return home with the following  A little help with walking and/or transfers;A little help with bathing/dressing/bathroom;Assistance with cooking/housework;Assist for  transportation;Help with stairs or ramp for entrance   Equipment Recommendations  BSC/3in1    Recommendations for Other Services      Precautions / Restrictions Precautions Precautions: Fall Precaution Comments: patient able to recall precautions Required Braces or Orthoses: Other Brace Other Brace: cam boot LLE and post op shoe on the right Restrictions Weight Bearing Restrictions: Yes RLE Weight Bearing: Weight bearing as tolerated LLE Weight Bearing: Non weight bearing       Mobility Bed Mobility Overal bed mobility: Independent Bed Mobility: Supine to Sit, Sit to Supine     Supine to sit: Independent Sit to supine: Independent   General bed mobility comments: no physical assistance needed with bed mobility    Transfers Overall transfer level: Needs assistance Equipment used: Rolling walker (2 wheels) Transfers: Sit to/from Stand Sit to Stand: From elevated surface, Min guard     Step pivot transfers: Min assist     General transfer comment: able to stand with min guard from raised bed but min assist for transfers for stability     Balance Overall balance assessment: Needs assistance Sitting-balance support: Feet unsupported, No upper extremity supported Sitting balance-Leahy Scale: Normal     Standing balance support: Bilateral upper extremity supported, During functional activity, Reliant on assistive device for balance Standing balance-Leahy Scale: Poor Standing balance comment: reliant on external support                           ADL either performed or assessed with clinical judgement   ADL Overall ADL's : Needs assistance/impaired     Grooming: Wash/dry hands;Wash/dry face;Min guard;Standing Grooming Details (indicate cue type and reason): used sink for support  Upper Body Dressing : Supervision/safety;Sitting Upper Body Dressing Details (indicate cue type and reason): donn shirt Lower Body Dressing: Minimal  assistance;Moderate assistance Lower Body Dressing Details (indicate cue type and reason): min assist to donn underwear, patient able to donn post op shoe on RLE, and max assist to donn cam boot on LLE Toilet Transfer: Minimal assistance;Rolling walker (2 wheels) Toilet Transfer Details (indicate cue type and reason): simulated in room           General ADL Comments: required assistance when standing to pull up underwear, patient leans on sink for support when performing grooming    Extremity/Trunk Assessment              Vision       Perception     Praxis      Cognition Arousal/Alertness: Awake/alert Behavior During Therapy: WFL for tasks assessed/performed Overall Cognitive Status: History of cognitive impairments - at baseline                                 General Comments: able to recall WB precautions        Exercises      Shoulder Instructions       General Comments      Pertinent Vitals/ Pain       Pain Assessment Pain Assessment: Faces Faces Pain Scale: Hurts a little bit Pain Location: LLE Pain Descriptors / Indicators: Grimacing Pain Intervention(s): Monitored during session, Limited activity within patient's tolerance, Repositioned  Home Living                                          Prior Functioning/Environment              Frequency  Min 2X/week        Progress Toward Goals  OT Goals(current goals can now be found in the care plan section)  Progress towards OT goals: Progressing toward goals  Acute Rehab OT Goals Patient Stated Goal: go to rehab OT Goal Formulation: With patient Time For Goal Achievement: 12/07/22 Potential to Achieve Goals: Good ADL Goals Pt Will Perform Grooming: with supervision;standing Pt Will Perform Lower Body Bathing: with supervision;sit to/from stand Pt Will Perform Lower Body Dressing: with supervision;sit to/from stand Pt Will Transfer to Toilet:  ambulating;with supervision;bedside commode Pt Will Perform Toileting - Clothing Manipulation and hygiene: with supervision;sit to/from stand  Plan Discharge plan remains appropriate    Co-evaluation                 AM-PAC OT "6 Clicks" Daily Activity     Outcome Measure   Help from another person eating meals?: None Help from another person taking care of personal grooming?: A Little Help from another person toileting, which includes using toliet, bedpan, or urinal?: A Lot Help from another person bathing (including washing, rinsing, drying)?: A Little Help from another person to put on and taking off regular upper body clothing?: None Help from another person to put on and taking off regular lower body clothing?: A Little 6 Click Score: 19    End of Session Equipment Utilized During Treatment: Gait belt;Rolling walker (2 wheels)  OT Visit Diagnosis: Unsteadiness on feet (R26.81);Muscle weakness (generalized) (M62.81);Other abnormalities of gait and mobility (R26.89)   Activity Tolerance Patient tolerated treatment well   Patient Left in bed;with  call bell/phone within reach;with bed alarm set   Nurse Communication Mobility status        Time: 1610-9604 OT Time Calculation (min): 24 min  Charges: OT General Charges $OT Visit: 1 Visit OT Treatments $Self Care/Home Management : 23-37 mins  Alfonse Flavors, OTA Acute Rehabilitation Services  Office [phone]   Carl Conley 11/29/2022, 11:30 AM

## 2022-11-29 NOTE — Progress Notes (Signed)
Subjective:  Deescalated to med tele floor. NAEON.  Recovering well, no hematoma or acute concerns. Tolerating PO intake without issues, no n/v, passing flatus. NWB LLE.   Ongoing dispo discussion, if he can't get a CIR bed he is OK with SNF, but he has a strong preference for CIR. I also believe he will benefit from CIR if possible, though he does not have strong social support.   Objective:  Vital signs in last 24 hours: Vitals:   11/28/22 1438 11/28/22 1952 11/28/22 2026 11/29/22 0552  BP: 122/77 105/72 112/64 130/83  Pulse: 63 78 64 (!) 56  Resp: 18 19 18 18   Temp: 98 F (36.7 C) 98.1 F (36.7 C) (!) 97.5 F (36.4 C) 97.6 F (36.4 C)  TempSrc: Oral Oral  Oral  SpO2: 99% 100% 99% 100%  Weight:      Height:       Physical Exam: General: Awake and alert, not in acute distress Cardiac: RRR, no MRG Respiratory: CTAB, normal work of breathing on room air Extremities: Bilateral feet covered in dressings. S/p amputation of R 2nd toe at MTP joint and L TMA. No hematoma over R groin access site. Neuro: Alert and oriented x4.    Assessment/Plan:  Principal Problem:   Diabetic osteomyelitis ([place]) Active Problems:   Osteomyelitis of left foot (HCC)  Carl Conley is a 37 year old man with a PMHx significant for T2DM, NAFLD, ADHD, and Depression who presents for bilateral foot wounds and admitted for management of bilateral osteomyelitis.    Bilateral Feet Osteomyelitis 2/2 to diabetic neuropathy Status post bilateral partial lower extremity amputations Presented w/ worsening bilateral foot wounds over the last 6 months. Previously seen in clinic, started on augmentin/doxycycline w/o resolution. Feet XRAY from 1/11 demonstrate bony destruction of the second toes bilaterally, and bony irregularity and periosteal reaction of the left metatarsal shaft consistent with osteomyelitis. CRP elevated at 1.1. ESR elevated at 49. MRI of L/R foot on admission with osteo.  The patient is now  s\p amputation of R 2nd toe at MTP joint, R distal fibula biopsy, debridement of R ankle, L TMA. Cultures thus far are growing MSSA and pseudomonas. R distal fibula bx also showing osteo, discussed with vascular and podiatry and they recommended attempting conservative management with abx and followup surgery only if necessary.  ID recommending outpatient IV cefepime, PICC line was placed and OPAT orders placed.  Angiogram shows sufficiently patent vasculature with no complications. Currently pending dispo planning. - appreciate podiatry, vascular, ID recs - plan for f/u ABI in 1 year  - Continue IV Cefepime per pharmacy.  PICC line and OPAT orders placed.  Needs outpatient ID follow-up. - Trend CBC - Scheduled Tylenol 1000 3 times daily, oxycodone 10 mg prn every 8 for severe pain -ongoing dispo planning with CIR/PT/OT   Hyperkalemia (resolved) Mild hyponatremia (resolved)   T2DM HLD A1c 6.5% from 10/2022.  On Invokamet 150-1000mg  BID at home. CBGs largely well managed postop. LDL 141 this morning with peripheral small vessel disease. Started on crestor 20. 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 Takes citalopram at home. Today endorsed infrequent sharp pain in L foot possibly with "phantom symptoms". Could possibly replace citalopram with duloxetine to cover MDD and chronic pain as well.    Hx of HSV keratitis Diagnosed in 2017. Seen by Dr. Katy Fitch, ophthalmology. Takes acyclovir  at home.  -Continue home acyclovir 400mg  BID   DVT Prophylaxis: Xarelto Diet: Carb modified,  Bowel: miralax PRN IVF: none Code: Full   Prior to Admission Living Arrangement: home w/ mother Anticipated Discharge Location: TBD Barriers to Discharge: continued management Dispo: Anticipated discharge in approximately more than 2 day(s).   [name], MD 11/29/2022, 6:36  AM Pager: [phone] After 5pm on weekdays and 1pm on weekends: On Call pager [phone]

## 2022-11-29 NOTE — Progress Notes (Signed)
Physical Therapy Treatment Patient Details Name: Carl Conley MRN: [id number] DOB: 02-20-1986 Today's Date: 11/29/2022   History of Present Illness 37 y/o male presents with B foot wounds 11/17/21 diagnosed with osteomyelitis B feet with MRI .  Underwent S/P right second toe amputation, debridement and bone biopsy of the later malleolus wound right  LE.   Left foot also underwent initial partial 2nd ray amputation, 3rd metatarsal head bone biopsy, and 1st metatarsal head bone biopsy.  Transmetatarsal amputation of the left foot completed 1/18.  PMHx: DM, visual snow syndrome, corneal ulcer of the right eye.    PT Comments    Pt pleasant and able to perform basic transfers without assist. Pt continues to require assist to snugly don post op shoe as well as to don CAM boot due to visual deficits. Pt with increase gait tolerance with NWB status this session. Pt denied wanting to attempt stairs again with RW and NWB. Contacted podiatry who stated pt can be allowed to WBAT on LLE to ascend 3 steps into home but otherwise should remain NWB. Discussed with pt home management due to AIR and SNF not being financially viable options and pt agreeable to have mom help with donning shoe/boot as bringing food to 2nd floor. Pt will scoot up stairs for full flight and reports he is agreeable to HHPT option at current mobility level. Will continue to follow acutely.     Recommendations for follow up therapy are one component of a multi-disciplinary discharge planning process, led by the attending physician.  Recommendations may be updated based on patient status, additional functional criteria and insurance authorization.  Follow Up Recommendations  Home health PT     Assistance Recommended at Discharge Intermittent Supervision/Assistance  Patient can return home with the following Assistance with cooking/housework;Direct supervision/assist for medications management;Assist for transportation;Help with stairs  or ramp for entrance   Equipment Recommendations  Rolling walker (2 wheels);BSC/3in1    Recommendations for Other Services       Precautions / Restrictions Precautions Precautions: Fall Precaution Comments: patient able to recall precautions Required Braces or Orthoses: Other Brace Other Brace: cam boot LLE and post op shoe on the right Restrictions Weight Bearing Restrictions: Yes RLE Weight Bearing: Weight bearing as tolerated LLE Weight Bearing: Non weight bearing     Mobility  Bed Mobility Overal bed mobility: Modified Independent                  Transfers Overall transfer level: Needs assistance   Transfers: Sit to/from Stand Sit to Stand: Supervision           General transfer comment: cues for hand placement    Ambulation/Gait Ambulation/Gait assistance: Supervision Gait Distance (Feet): 160 Feet Assistive device: Rolling walker (2 wheels) Gait Pattern/deviations: Step-to pattern   Gait velocity interpretation: 1.31 - 2.62 ft/sec, indicative of limited community ambulator   General Gait Details: pt maintains NWB LLE and able to progress distance safely with use of RW   Stairs             Wheelchair Mobility    Modified Rankin (Stroke Patients Only)       Balance Overall balance assessment: Mild deficits observed, not formally tested                                          Cognition Arousal/Alertness: Awake/alert Behavior During Therapy: [facility] for  tasks assessed/performed Overall Cognitive Status: Within Functional Limits for tasks assessed                                          Exercises      General Comments        Pertinent Vitals/Pain Pain Assessment Pain Assessment: No/denies pain    Home Living                          Prior Function            PT Goals (current goals can now be found in the care plan section) Progress towards PT goals: Progressing toward  goals    Frequency    Min 3X/week      PT Plan Discharge plan needs to be updated    Co-evaluation              AM-PAC PT "6 Clicks" Mobility   Outcome Measure  Help needed turning from your back to your side while in a flat bed without using bedrails?: None Help needed moving from lying on your back to sitting on the side of a flat bed without using bedrails?: None Help needed moving to and from a bed to a chair (including a wheelchair)?: A Little Help needed standing up from a chair using your arms (e.g., wheelchair or bedside chair)?: A Little Help needed to walk in hospital room?: A Little Help needed climbing 3-5 steps with a railing? : Total 6 Click Score: 18    End of Session   Activity Tolerance: Patient tolerated treatment well Patient left: in bed;with call bell/phone within reach Nurse Communication: Mobility status PT Visit Diagnosis: Other abnormalities of gait and mobility (R26.89);Difficulty in walking, not elsewhere classified (R26.2)     Time: 9381-0175 PT Time Calculation (min) (ACUTE ONLY): 27 min  Charges:  $Gait Training: 8-22 mins $Therapeutic Activity: 8-22 mins                     Bayard Males, PT Acute Rehabilitation Services Office: [place] 11/29/2022, 12:35 PM

## 2022-11-30 LAB — GLUCOSE, CAPILLARY
Glucose-Capillary: 172 mg/dL — ABNORMAL HIGH (ref 70–99)
Glucose-Capillary: 155 mg/dL — ABNORMAL HIGH (ref 70–99)
Glucose-Capillary: 101 mg/dL — ABNORMAL HIGH (ref 70–99)
Glucose-Capillary: 170 mg/dL — ABNORMAL HIGH (ref 70–99)
Glucose-Capillary: 178 mg/dL — ABNORMAL HIGH (ref 70–99)

## 2022-11-30 NOTE — Progress Notes (Addendum)
Subjective:  NAEON.  Recovering well, no hematoma or acute concerns. Tolerating PO intake without issues, no n/v, passing flatus. NWB LLE.   Ongoing dispo discussion, not a candidate for CIR.  Discussed possibility of home health and he is amenable to this as long as he has specific weightbearing instructions and safety plan for navigating his home and is also able to see podiatry again for wound care instructions.  Of note, we discussed the possibility of changing his citalopram to Cymbalta to also treat chronic pain.  He mentioned his pain is not significant enough at this point that he would want to make this change but that he will let us know in the future if so desired.  Objective:  Vital signs in last 24 hours: Vitals:   11/29/22 0800 11/29/22 1537 11/29/22 1943 11/30/22 0510  BP: 118/78 107/82 115/75 119/71  Pulse: (!) 52 64 63 66  Resp:   18 17  Temp:  97.9 F (36.6 C) 97.8 F (36.6 C)   TempSrc:   Oral   SpO2: 100% 99% 100% 98%  Weight:      Height:       Physical Exam: General: Awake and alert, not in acute distress Cardiac: RRR, no MRG Respiratory: CTAB, normal work of breathing on room air Extremities: Bilateral feet covered in dressings. S/p amputation of R 2nd toe at MTP joint and L TMA. No hematoma over R groin access site. Neuro: Alert and oriented x4.    Assessment/Plan:  Principal Problem:   Diabetic osteomyelitis ([place]) Active Problems:   Osteomyelitis of left foot (HCC)  Carl Conley is a 37 year old man with a PMHx significant for T2DM, NAFLD, ADHD, and Depression who presents for bilateral foot wounds and admitted for management of bilateral osteomyelitis.    Bilateral Feet Osteomyelitis 2/2 to diabetic neuropathy Status post bilateral partial lower extremity amputations Presented w/ worsening bilateral foot wounds over the last 6 months. Previously seen in clinic, started on augmentin/doxycycline w/o resolution. Feet XRAY from 1/11 demonstrate  bony destruction of the second toes bilaterally, and bony irregularity and periosteal reaction of the left metatarsal shaft consistent with osteomyelitis. CRP elevated at 1.1. ESR elevated at 49. MRI of L/R foot on admission with osteo.  The patient is now s\p amputation of R 2nd toe at MTP joint, R distal fibula biopsy, debridement of R ankle, L TMA. Cultures thus far are growing MSSA and pseudomonas. R distal fibula bx also showing osteo, discussed with vascular and podiatry and they recommended attempting conservative management with abx and followup surgery only if necessary.  ID recommending outpatient IV cefepime, PICC line was placed and OPAT orders placed.  Angiogram shows sufficiently patent vasculature with no complications.  Likely will discharge after podiatry is able to reevaluate him and provide final weightbearing and wound care instructions this weekend - appreciate podiatry, vascular, ID recs - plan for f/u ABI in 1 year  - Continue IV Cefepime per pharmacy.  PICC line and OPAT orders placed.  Needs outpatient ID follow-up. - Trend CBC - Scheduled Tylenol 1000 3 times daily, oxycodone 10 mg prn every 8 for severe pain -PT/OT recommended home health   Hyperkalemia (resolved) Mild hyponatremia (resolved)   T2DM HLD A1c 6.5% from 10/2022.  On Invokamet 150-1000mg  BID at home. CBGs largely well managed postop. LDL 141 this morning with peripheral small vessel disease. Started on crestor 20. 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 Takes citalopram at home. Today endorsed infrequent sharp pain in L foot possibly with "phantom symptoms".  Will hold off on changing regimen at this point as he declined.   Hx of HSV keratitis Diagnosed in 2017. Seen by Dr. Katy Fitch, ophthalmology. Takes acyclovir at home.  -Continue home acyclovir 400mg  BID   DVT Prophylaxis:  Xarelto Diet: Carb modified,  Bowel: miralax PRN IVF: none Code: Full   Prior to Admission Living Arrangement: home w/ mother Anticipated Discharge Location: Home Barriers to Discharge: continued management Dispo: Anticipated discharge in approximately more than 2 day(s).   [name], MD 11/30/2022, 7:27 AM Pager: [phone] After 5pm on weekdays and 1pm on weekends: On Call pager [phone]

## 2022-11-30 NOTE — Progress Notes (Signed)
Physical Therapy Treatment Patient Details Name: Carl Conley MRN: [id number] DOB: 01-24-1986 Today's Date: 11/30/2022   History of Present Illness 37 y/o male presents with B foot wounds 11/17/21 diagnosed with osteomyelitis B feet with MRI .  Underwent S/P right second toe amputation, debridement and bone biopsy of the later malleolus wound right  LE.   Left foot also underwent initial partial 2nd ray amputation, 3rd metatarsal head bone biopsy, and 1st metatarsal head bone biopsy.  Transmetatarsal amputation of the left foot completed 1/18.  PMHx: DM, visual snow syndrome, corneal ulcer of the right eye.    PT Comments    Pt pleasant and continues to need assist for managing CAM boot and post op shoe due to vision. Pt educated for and performed stairs today with reliance on weight bearing on LLE to complete and pt aware that limited stairs are the only time he should have weight on LLE per podiatry. Pt encouraged to work on transfers from regular height not elevated surfaces and educated for hand placement with transfers. D/C plan remains appropriate.     Recommendations for follow up therapy are one component of a multi-disciplinary discharge planning process, led by the attending physician.  Recommendations may be updated based on patient status, additional functional criteria and insurance authorization.  Follow Up Recommendations  Home health PT     Assistance Recommended at Discharge Intermittent Supervision/Assistance  Patient can return home with the following Assistance with cooking/housework;Direct supervision/assist for medications management;Assist for transportation;Help with stairs or ramp for entrance   Equipment Recommendations  Rolling walker (2 wheels);BSC/3in1    Recommendations for Other Services       Precautions / Restrictions Precautions Precautions: Fall Precaution Comments: patient able to recall precautions Other Brace: cam boot LLE and post op shoe on  the right Restrictions RLE Weight Bearing: Weight bearing as tolerated LLE Weight Bearing: Non weight bearing Other Position/Activity Restrictions: per podiatry allowed to WBAT LLE for completing 3 steps into house only     Mobility  Bed Mobility Overal bed mobility: Modified Independent                  Transfers Overall transfer level: Needs assistance   Transfers: Sit to/from Stand Sit to Stand: Min guard           General transfer comment: cues for hand placement from regular height surface, pt frequently asks for elevated surface. pt performed sit to stand x 4 trials    Ambulation/Gait Ambulation/Gait assistance: Min guard Gait Distance (Feet): 50 Feet Assistive device: Rolling walker (2 wheels) Gait Pattern/deviations: Step-to pattern   Gait velocity interpretation: 1.31 - 2.62 ft/sec, indicative of limited community ambulator   General Gait Details: pt maintains NWB LLE and able to progress distance safely with use of RW. Pt walked 5', 15', 20', 50' respectively with seated rest between trials   Stairs Stairs: Yes Stairs assistance: Min assist Stair Management: Backwards, With walker Number of Stairs: 3 General stair comments: pt reliant on weight on LLE to be able to step up stairs with use of stabilized RW. Pt with impaired balance and mod cues for sequence and safety. Pt educated for scooting up flight via demonstration with education for how to clear final stairs with use of railing and need for assist of mom to move RW as well as stabilize RW with home entry   Wheelchair Mobility    Modified Rankin (Stroke Patients Only)       Balance Overall balance assessment:  Mild deficits observed, not formally tested Sitting-balance support: Feet unsupported, No upper extremity supported Sitting balance-Leahy Scale: Normal     Standing balance support: Bilateral upper extremity supported, During functional activity, Reliant on assistive device for  balance   Standing balance comment: reliant on external support to maintain NWB                            Cognition Arousal/Alertness: Awake/alert Behavior During Therapy: WFL for tasks assessed/performed Overall Cognitive Status: Within Functional Limits for tasks assessed                                          Exercises      General Comments        Pertinent Vitals/Pain Pain Assessment Pain Assessment: No/denies pain    Home Living                          Prior Function            PT Goals (current goals can now be found in the care plan section) Progress towards PT goals: Progressing toward goals    Frequency    Min 3X/week      PT Plan Current plan remains appropriate    Co-evaluation              AM-PAC PT "6 Clicks" Mobility   Outcome Measure  Help needed turning from your back to your side while in a flat bed without using bedrails?: None Help needed moving from lying on your back to sitting on the side of a flat bed without using bedrails?: None Help needed moving to and from a bed to a chair (including a wheelchair)?: A Little Help needed standing up from a chair using your arms (e.g., wheelchair or bedside chair)?: A Little Help needed to walk in hospital room?: A Little Help needed climbing 3-5 steps with a railing? : A Little 6 Click Score: 20    End of Session Equipment Utilized During Treatment: Other (comment) (CAM, post op shoe) Activity Tolerance: Patient tolerated treatment well Patient left: in chair;with call bell/phone within reach Nurse Communication: Mobility status PT Visit Diagnosis: Other abnormalities of gait and mobility (R26.89);Difficulty in walking, not elsewhere classified (R26.2)     Time: 1610-9604 PT Time Calculation (min) (ACUTE ONLY): 25 min  Charges:  $Gait Training: 8-22 mins $Therapeutic Activity: 8-22 mins                     Bayard Males, PT Acute  Rehabilitation Services Office: [place] 11/30/2022, 10:47 AM

## 2022-11-30 NOTE — Progress Notes (Signed)
Occupational Therapy Treatment Patient Details Name: Carl Conley MRN: [id number] DOB: 10-01-1986 Today's Date: 11/30/2022   History of present illness 37 y/o male presents with B foot wounds 11/17/21 diagnosed with osteomyelitis B feet with MRI .  Underwent S/P right second toe amputation, debridement and bone biopsy of the later malleolus wound right  LE.   Left foot also underwent initial partial 2nd ray amputation, 3rd metatarsal head bone biopsy, and 1st metatarsal head bone biopsy.  Transmetatarsal amputation of the left foot completed 1/18.  PMHx: DM, visual snow syndrome, corneal ulcer of the right eye.   OT comments  Addressed LB dressing with donning CAM boot on LLE. White numbering added to strap to aide in vision deficits and order to donn straps. Patient demonstrated gains with donning but continues to have difficulty with last strap and velcro due to strap size vs edema. Patient performed transfers to [facility] to simulate home environment. Patient to continue to be followed by acute OT while in this setting with continued OT recommended with [place] for safety with transfers and self care.    Recommendations for follow up therapy are one component of a multi-disciplinary discharge planning process, led by the attending physician.  Recommendations may be updated based on patient status, additional functional criteria and insurance authorization.    Follow Up Recommendations  Home health OT     Assistance Recommended at Discharge Frequent or constant Supervision/Assistance  Patient can return home with the following  A little help with walking and/or transfers;A little help with bathing/dressing/bathroom;Assistance with cooking/housework;Assist for transportation;Help with stairs or ramp for entrance   Equipment Recommendations  BSC/3in1    Recommendations for Other Services      Precautions / Restrictions Precautions Precautions: Fall Precaution Comments: patient able to recall  precautions Other Brace: cam boot LLE and post op shoe on the right Restrictions Weight Bearing Restrictions: Yes RLE Weight Bearing: Weight bearing as tolerated LLE Weight Bearing: Non weight bearing Other Position/Activity Restrictions: per podiatry allowed to WBAT LLE for completing 3 steps into house only       Mobility Bed Mobility Overal bed mobility: Modified Independent             General bed mobility comments: no assistance needed with bed mobility    Transfers Overall transfer level: Needs assistance Equipment used: Rolling walker (2 wheels) Transfers: Sit to/from Stand Sit to Stand: Min guard, From elevated surface           General transfer comment: from raised bed to simulate bed at home     Balance Overall balance assessment: Needs assistance Sitting-balance support: Feet unsupported, No upper extremity supported Sitting balance-Leahy Scale: Normal     Standing balance support: Bilateral upper extremity supported, During functional activity, Reliant on assistive device for balance Standing balance-Leahy Scale: Poor Standing balance comment: reliant on external support to maintain NWB                           ADL either performed or assessed with clinical judgement   ADL Overall ADL's : Needs assistance/impaired                     Lower Body Dressing: Minimal assistance;Sitting/lateral leans Lower Body Dressing Details (indicate cue type and reason): education on donning CAM boot with white lettering on straps to assit with vision deficits Toilet Transfer: Minimal assistance;Rolling walker (2 wheels) Toilet Transfer Details (indicate cue type and reason):  to [facility]           General ADL Comments: improvement on donning CAM boot with letting added to straps    Extremity/Trunk Assessment              Vision       Perception     Praxis      Cognition Arousal/Alertness: Awake/alert Behavior During Therapy: WFL  for tasks assessed/performed Overall Cognitive Status: Within Functional Limits for tasks assessed                                          Exercises      Shoulder Instructions       General Comments      Pertinent Vitals/ Pain       Pain Assessment Pain Assessment: No/denies pain Pain Intervention(s): Monitored during session  Home Living                                          Prior Functioning/Environment              Frequency  Min 2X/week        Progress Toward Goals  OT Goals(current goals can now be found in the care plan section)  Progress towards OT goals: Progressing toward goals  Acute Rehab OT Goals Patient Stated Goal: get better OT Goal Formulation: With patient Time For Goal Achievement: 12/07/22 Potential to Achieve Goals: Good ADL Goals Pt Will Perform Grooming: with supervision;standing Pt Will Perform Lower Body Bathing: with supervision;sit to/from stand Pt Will Perform Lower Body Dressing: with supervision;sit to/from stand Pt Will Transfer to Toilet: ambulating;with supervision;bedside commode Pt Will Perform Toileting - Clothing Manipulation and hygiene: with supervision;sit to/from stand  Plan Discharge plan remains appropriate    Co-evaluation                 AM-PAC OT "6 Clicks" Daily Activity     Outcome Measure   Help from another person eating meals?: None Help from another person taking care of personal grooming?: A Little Help from another person toileting, which includes using toliet, bedpan, or urinal?: A Little Help from another person bathing (including washing, rinsing, drying)?: A Little Help from another person to put on and taking off regular upper body clothing?: None Help from another person to put on and taking off regular lower body clothing?: A Little 6 Click Score: 20    End of Session Equipment Utilized During Treatment: Gait belt;Rolling walker (2 wheels)  OT  Visit Diagnosis: Unsteadiness on feet (R26.81);Muscle weakness (generalized) (M62.81);Other abnormalities of gait and mobility (R26.89)   Activity Tolerance Patient tolerated treatment well   Patient Left in bed;with call bell/phone within reach   Nurse Communication Mobility status        Time: 5035-4656 OT Time Calculation (min): 36 min  Charges: OT General Charges $OT Visit: 1 Visit OT Treatments $Self Care/Home Management : 23-37 mins  Lodema Hong, [place]  Office [place] 11/30/2022, 11:06 AM

## 2022-12-01 DIAGNOSIS — Z7984 Long term (current) use of oral hypoglycemic drugs: Secondary | ICD-10-CM

## 2022-12-01 LAB — GLUCOSE, CAPILLARY
Glucose-Capillary: 155 mg/dL — ABNORMAL HIGH (ref 70–99)
Glucose-Capillary: 129 mg/dL — ABNORMAL HIGH (ref 70–99)
Glucose-Capillary: 162 mg/dL — ABNORMAL HIGH (ref 70–99)
Glucose-Capillary: 159 mg/dL — ABNORMAL HIGH (ref 70–99)

## 2022-12-01 NOTE — Progress Notes (Signed)
Mobility Specialist Progress Note:   12/01/22 1150  Mobility  Activity Ambulated with assistance in hallway  Level of Assistance Standby assist, set-up cues, supervision of patient - no hands on  Assistive Device Front wheel walker  Distance Ambulated (ft) 100 ft  RLE Weight Bearing WBAT  LLE Weight Bearing NWB  Activity Response Tolerated well  Mobility Referral Yes  $Mobility charge 1 Mobility   Pt received in bed and agreeable. No complaints. Required assistance donning post op shoe on RLE and CAM boot on LLE. Pt returned to bed with all needs met and call bell in reach.   Andrey Campanile Mobility Specialist Please contact via SecureChat or  Rehab office at [phone]

## 2022-12-01 NOTE — Progress Notes (Signed)
   HD#[id number] SUBJECTIVE:  Patient Summary: Carl Conley is a 37 y.o. with a pertinent PMH of T2DM, NAFLD, ADHD, and Depression, who presented with bilateral foot wounds and admitted for bilateral osteomyelitis .   Overnight Events: No acute events overnight  Interim History: The patient continues to do well and denies any pain. Was eating breakfast this morning with no n/v, abd pain. Has no acute concerns at this time  OBJECTIVE:  Vital Signs: Vitals:   11/30/22 0510 11/30/22 0956 11/30/22 2117 12/01/22 0514  BP: 119/71 133/77 107/63 115/80  Pulse: 66 (!) 59 72 67  Resp: 17 18 17 16   Temp:   98 F (36.7 C) 97.9 F (36.6 C)  TempSrc:    Oral  SpO2: 98% 99% 99% 99%  Weight:      Height:       Supplemental O2: Room Air SpO2: 99 % O2 Flow Rate (L/min): 10 L/min  Filed Weights   11/22/22 1437 11/26/22 2030  Weight: 101 kg 98.9 kg     Intake/Output Summary (Last 24 hours) at 12/01/2022 0620 Last data filed at 12/01/2022 0602 Gross per 24 hour  Intake 1531.92 ml  Output 3900 ml  Net -2368.08 ml   Net IO Since Admission: -21,155.26 mL [12/01/22 0620]  Physical Exam: General: No acute distress. CV: RRR. No murmurs, rubs, or gallops.  Pulmonary: Normal work of breathing Extremities: s/p amputation of R 2nd toe at MTP joint and L TMA. Feet covered in dressings Neuro:  No focal deficit Psych: Normal mood and affect  ASSESSMENT/PLAN:  Assessment: Principal Problem:   Diabetic osteomyelitis (HCC) Active Problems:   Osteomyelitis of left foot (HCC)   Plan: Bilateral feet osteomyelitis 2/2 diabetic neuropathy s/p bilateral partial lower extremity amputations S/p amputation of R 2nd toe at MTP joint, R distal fibula biopsy, debridement of R ankle, and L TMA. Cultures thus far are growing MSSA and pseudomonas. R distal fibula bx also showing osteo, discussed with vascular and podiatry and they recommended attempting conservative management with abx and followup surgery only  if necessary.  ID recommending outpatient IV cefepime, PICC line was placed and OPAT orders placed.  Angiogram shows sufficiently patent vasculature with no complications.  Likely will discharge after podiatry is able to reevaluate him and provide final weightbearing and wound care instructions this weekend. - Plan for f/u ABI in 1 year - Continue IV Cefepime per pharmacy.  PICC line and OPAT orders placed.  Needs outpatient ID follow-up. - Scheduled Tylenol 1000 3 times daily, oxycodone 10 mg prn every 8 for severe pain - PT/OT recommended home health  Type 2 diabetes A1c 6.5%. On Invokamet 150-1000mg  BID at home. 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 Takes citalopram at home. Today endorsed infrequent sharp pain in L foot possibly with "phantom symptoms".  Will hold off on changing regimen at this point as he declined.   Best Practice: Diet: Diabetic diet IVF: Fluids: none VTE: rivaroxaban (XARELTO) tablet 10 mg Start: 11/27/22 1700 Code: Full AB: Cefepime Therapy Recs: Home Health, DME: bedside commode and walker DISPO: Anticipated discharge to Home pending  weight bearing status and safe dispo plan .  Signature: Buddy Duty, D.O.  Internal Medicine Resident, PGY-2 [facility]  Pager: [phone] 6:20 AM, 12/01/2022   Please contact the on call pager after 5 pm and on weekends at [phone].

## 2022-12-01 NOTE — Progress Notes (Signed)
Subjective: S/p transmetatarsal amputation with tendo Achilles lengthening left.  DOS: 11/22/2022. S/p debridement right ankle with application of graft.  Amputation RT second toe.  DOS: 11/19/2022  Patient resting comfortably in bed.  Dressings are intact.  He is anxiously anticipating discharge.  Objective: Vital signs in last 24 hours: Temp:  [97.9 F (36.6 C)-98.3 F (36.8 C)] 98.3 F (36.8 C) (01/27 0914) Pulse Rate:  [60-72] 60 (01/27 0914) Resp:  [16-17] 17 (01/27 0914) BP: (107-118)/(63-80) 118/74 (01/27 0914) SpO2:  [98 %-99 %] 98 % (01/27 0914) RT FOOT/ANKLE: Wound graft is well adhered and intact to the lateral aspect of the right ankle overlying the fibular malleolus.  No drainage.  Amputation site of the right second toe demonstrates good routine healing.  Sutures are intact.  No drainage.  Very stable LT FOOT: Good closure of the amputation stump left foot.  No drainage.  Well coapted incision.  Sutures and staples are intact.  Bilaterally there is no appreciable erythema or edema to the feet.  Surgical sites appear to be very stable with good potential for healing.  ABDOMINAL AORTOGRAM W/ LOWER EXTREMITY 11/26/2022 Dr. Cassandria Santee, [facility] Findings:  No flow-limiting stenosis appreciated in bilateral lower extremities. Small vessel disease in the feet. Pedal arches not patent.    Assessment/Plan: 5 Days Post-Op Procedure(s) (LRB): ABDOMINAL AORTOGRAM W/LOWER EXTREMITY (N/A) -Patient was evaluated and dressings were changed today.  Surgical lubricant and dry dressings were applied to the right ankle -Leave dressings clean dry and intact until follow-up in office.  Do not change dressings.  Reinforce as needed -WBAT bilateral -Antibiotics as per ID recommendations.  Greatly appreciated -From a podiatry/surgical standpoint patient okay for discharge.   Edrick Kins 12/01/2022, 12:41 PM

## 2022-12-02 LAB — GLUCOSE, CAPILLARY
Glucose-Capillary: 152 mg/dL — ABNORMAL HIGH (ref 70–99)
Glucose-Capillary: 116 mg/dL — ABNORMAL HIGH (ref 70–99)
Glucose-Capillary: 160 mg/dL — ABNORMAL HIGH (ref 70–99)

## 2022-12-02 NOTE — Progress Notes (Signed)
Pt just drank ice water and ate breakfast

## 2022-12-02 NOTE — Discharge Summary (Signed)
Name: Carl Conley MRN: [id number] DOB: November 08, 1985 37 y.o. PCP: Riesa Pope, MD  Date of Admission: 11/17/2022  2:59 PM Date of Discharge:  12/03/2022 Attending Physician: Dr. Evette Doffing  DISCHARGE DIAGNOSIS:  Primary Problem: Diabetic osteomyelitis [facility])   Hospital Problems: Principal Problem:   Diabetic osteomyelitis ([place]) Active Problems:   Diabetes ([place])   Depression   Osteomyelitis of left foot ([place])   Pressure injury of skin    DISCHARGE MEDICATIONS:   Allergies as of 12/03/2022   No Known Allergies      Medication List     TAKE these medications    acyclovir 400 MG tablet Commonly known as: ZOVIRAX Take 400 mg by mouth 2 (two) times daily.   ceFEPime  IVPB Commonly known as: MAXIPIME Inject 2 g into the vein every 8 (eight) hours. Indication:  DFI w/ MSSA and PsA First Dose: Yes Last Day of Therapy:  01/03/23 Labs - Once weekly:  CBC/D and BMP, Labs - Every other week:  ESR and CRP Method of administration: IV Push Remove PICC line after the completion of IV antibiotics Method of administration may be changed at the discretion of home infusion pharmacist based upon assessment of the patient and/or caregiver's ability to self-administer the medication ordered.   citalopram 20 MG tablet Commonly known as: CELEXA TAKE 1 TABLET BY MOUTH EVERY DAY   Invokamet XR [phone] MG Tb24 Generic drug: Canagliflozin-metFORMIN HCl ER Take 2 tablets by mouth daily. What changed:  how much to take when to take this   NEOSPORIN EX Apply 1 Application topically daily as needed (antibiotic ointment).   rosuvastatin 20 MG tablet Commonly known as: CRESTOR Take 1 tablet (20 mg total) by mouth daily.   timolol 0.5 % ophthalmic solution Commonly known as: TIMOPTIC Place 1 drop into the right eye every morning.   VITAMIN D PO Take 1 tablet by mouth daily.               Durable Medical Equipment  (From admission, onward)           Start      Ordered   12/03/22 0803  DME 3-in-1  Once        12/03/22 0803   12/03/22 0803  DME Walker  Once       Question Answer Comment  Walker: With 5 Inch Wheels   Patient needs a walker to treat with the following condition History of transmetatarsal amputation of foot ([place])      12/03/22 0803   11/29/22 1252  For home use only DME Walker rolling  Once       Comments: Osteomeylitis nonweight bearing on one side  Question Answer Comment  Walker: With [place] Wheels   Patient needs a walker to treat with the following condition Weakness      11/29/22 1252              Discharge Care Instructions  (From admission, onward)           Start     Ordered   12/03/22 0000  Leave dressing on - Keep it clean, dry, and intact until clinic visit        12/03/22 0803   11/23/22 0000  Change dressing on IV access line weekly and PRN  (Home infusion instructions - [facility] )        11/23/22 1507            DISPOSITION AND FOLLOW-UP:  Mr.Harjas  ALEXANDRE FARIES was discharged from [facility] in Stable condition. At the hospital follow up visit please address:  Osteomyelitis: Ensure amputation wounds are healing well and that patient is getting his outpatient cefepime. ID follow up on 2/13. Will need to follow up with podiatry as well.  Small vessel dz: follow up ABI in 1 year per vascular surgery  Depression: Consider switching citalopram to duloxetine if patient continues to have phantom pain  Follow-up Recommendations: Consults: Infectious disease, podiatry Labs: Weekly labs while on IV antibiotics: CBC with diff, CMP, CRP Studies: ABI in 1 year Medications: Cefepime 2g q8h through 2/29  Follow-up Appointments:  Follow-up Information     Belva Agee, MD Follow up.   Specialty: Internal Medicine Contact information: [address] [place] [address] [phone]         Raymondo Band, MD Follow up.   Specialty: Infectious  Diseases Why: 12/18/22 at 10:30 am. Please call to re-scheudule if you are not able to make this appointment. Contact information: [address] [place] [address] [phone]         [facility] Follow up.   Why: Your medical equipment (walker) Contact information: [address] [address] [phone]         Felecia Shelling, DPM Follow up.   Specialty: Podiatry Contact information: [address] [place] [address] [place] [address] [phone]         [facility] Follow up.   Specialty: Home Health Services Why: for home health nursing for PICC line care Contact information: [address] [place] [address] [phone]         [facility] Follow up.   Why: Home pharmacy for IV meds                HOSPITAL COURSE:  Patient Summary: Bilateral Feet Osteomyelitis 2/2 to diabetic neuropathy Status post bilateral partial lower extremity amputations Presented with worsening bilateral foot wounds over the last 6 months. Previously seen in our clinic on 12/28, started on augmentin/doxycycline for a two week course w/o resolution. Feet XRAY from 1/11 demonstrate bony destruction of the second toes bilaterally, and bony irregularity and periosteal reaction of the left metatarsal shaft consistent with osteomyelitis. CRP elevated at 1.1. ESR elevated at 49. MRI of L/R foot on admission with osteo.  The patient is now s\p amputation of R 2nd toe at MTP joint, R distal fibula biopsy, debridement of R ankle, and L TMA. Cultures grew MSSA and pseudomonas. R distal fibula bx also showing osteo, discussed with vascular and podiatry and they recommended attempting conservative management with abx and followup surgery only if necessary.  ID recommending outpatient IV cefepime, PICC line was placed and OPAT orders placed- will receive cefepime through 2/29 and has follow up with infectious disease  scheduled.  Angiogram during hospitalization showed normal arteries in bilatearl lower extremities to the level of the feet but significant small vessel disease without intact pedal arches. Per vascular surgery, there are no revascularization options at this time.  Patient will discharge home with home health and will follow up in the [facility] He is able to bear weight as tolerated per podiatry. Additionally, he is recommended to get a follow up ABI in 1 year.   T2DM A1c 6.5% from 10/2022.  On Invokamet 150-1000mg  BID at home. CBGs largely well managed postop.   HLD LDL 141 with peripheral small vessel disease.  Started on crestor 20.  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 Takes citalopram at home. Endorsed very infrequent sharp pain in L foot possibly with "phantom symptoms".  Will hold off on changing regimen at this point as he declined- had suggested switching to duloxetine during admission.  Hx of HSV keratitis Diagnosed in 2017. Seen by Dr. Katy Fitch, ophthalmology. Takes acyclovir at home  Discussed mobility and assistive devices. Discussed importance of antibiotics.   DISCHARGE INSTRUCTIONS:   Discharge Instructions     [facility] pharmacist to adjust dose for Vancomycin, Aminoglycosides and other anti-infective therapies as requested by physician.   Complete by: As directed    [facility] to provide Cath Flo 2mg    Complete by: As directed    Administer for PICC line occlusion and as ordered by physician for other access device issues.   Anaphylaxis Kit: Provided to treat any anaphylactic reaction to the medication being provided to the patient if First Dose or when requested by physician   Complete by: As directed    Epinephrine 1mg /ml vial / amp: Administer 0.3mg  (0.88ml) subcutaneously once for moderate to severe anaphylaxis, nurse to call physician and pharmacy when reaction occurs and call 911 if needed for immediate care    Diphenhydramine 50mg /ml IV vial: Administer 25-50mg  IV/IM PRN for first dose reaction, rash, itching, mild reaction, nurse to call physician and pharmacy when reaction occurs   Sodium Chloride 0.9% NS 550ml IV: Administer if needed for hypovolemic blood pressure drop or as ordered by physician after call to physician with anaphylactic reaction   Call MD for:  severe uncontrolled pain   Complete by: As directed    Call MD for:  temperature >100.4   Complete by: As directed    Change dressing on IV access line weekly and PRN   Complete by: As directed    Diet Carb Modified   Complete by: As directed    Discharge instructions   Complete by: As directed    Dear Mr. Palos,  You were hospitalized for infections in your feet and you got the amputations. You will need IV antibiotics for a few weeks to help treat the remainder of the infection. You will be getting Cefepime (antibiotic) through your PICC line every 8 hours until 2/29. You will also need to see the infectious disease doctors on 2/13 at 10:30 AM for a follow up regarding this antibiotic therapy.   The podiatrist will also need you to follow up with their office. Leave the dressings on your feet until you see them. You are able to walk/stand on your feet, as you are able to tolerate.   Lastly, we will get you an appointment with the internal medicine center. I will have them call you this week, to set up an appointment in the next week or two. If you have any questions or concerns, call our clinic at [phone] or after hours call [phone] and ask for the internal medicine resident on call. We are glad you are feeling better!   Face-to-face encounter (required for Medicare/Medicaid patients)   Complete by: As directed    I Jesly Hartmann N Eduin Friedel certify that this patient is under my care and that I, or a nurse practitioner or physician's assistant working with me, had a face-to-face encounter that meets the physician face-to-face encounter  requirements with this patient on 12/02/2022. The encounter with the patient was in whole, or in part for the following medical condition(s) which is the  primary reason for home health care (List medical condition): transmetatarsal amputation on left (needs IV antibiotics) and physical deconditioning   The encounter with the patient was in whole, or in part, for the following medical condition, which is the primary reason for home health care: transmetatarsal amputation   I certify that, based on my findings, the following services are medically necessary home health services:  Physical therapy Nursing     Reason for Medically Necessary Home Health Services:  Skilled Nursing- Teaching of Disease Process/Symptom Management Skilled Nursing- Lab Monitoring Requiring Frequent Medication Adjustment Therapy- Home Adaptation to Facilitate Safety     My clinical findings support the need for the above services: OTHER SEE COMMENTS   Further, I certify that my clinical findings support that this patient is homebound due to: Unsafe ambulation due to balance issues   Flush IV access with Sodium Chloride 0.9% and Heparin 10 units/ml or 100 units/ml   Complete by: As directed    Home Health   Complete by: As directed    To provide the following care/treatments:  PT OT RN     Home infusion instructions - [facility]   Complete by: As directed    Instructions: Flush IV access with Sodium Chloride 0.9% and Heparin 10units/ml or 100units/ml   Change dressing on IV access line: Weekly and PRN   Instructions Cath Flo 2mg : Administer for PICC Line occlusion and as ordered by physician for other access device   [facility] pharmacist to adjust dose for: Vancomycin, Aminoglycosides and other anti-infective therapies as requested by physician   Increase activity slowly   Complete by: As directed    Leave dressing on - Keep it clean, dry, and intact until clinic visit   Complete by: As  directed    Method of administration may be changed at the discretion of home infusion pharmacist based upon assessment of the patient and/or caregiver's ability to self-administer the medication ordered   Complete by: As directed        SUBJECTIVE:  The patient was seen on the day of discharge. He is having no pain and is eager to get home. He has been ambulating well with the assistance of his walker.   Discharge Vitals:   BP 113/84 (BP Location: Left Arm)   Pulse 63   Temp (!) 97.5 F (36.4 C) (Oral)   Resp 16   Ht 5\' 11"  (1.803 m)   Wt 98.9 kg   SpO2 98%   BMI 30.41 kg/m   OBJECTIVE:  General: No acute distress. Appears comfortable sitting in bed.  CV: RRR. No murmurs, rubs, or gallops.  Pulmonary: Lungs CTAB. Normal effort. No wheezing or rales. Extremities: S/p R 2nd toe amputation at MTP joint and L TMA. Feet covered in dressings Neuro: A&Ox3. No focal deficit. Psych: Normal mood and affect    Pertinent Labs, Studies, and Procedures:     Latest Ref Rng & Units 11/27/2022    5:37 AM 11/26/2022    9:13 PM 11/24/2022    3:47 AM  CBC  WBC 4.0 - 10.5 K/uL 7.0  7.2  8.1   Hemoglobin 13.0 - 17.0 g/dL 9.9  9.8  10.1   Hematocrit 39.0 - 52.0 % 31.3  31.8  30.4   Platelets 150 - 400 K/uL 275  277  214        Latest Ref Rng & Units 11/27/2022    5:37 AM 11/26/2022    9:13 PM 11/24/2022  3:47 AM  CMP  Glucose 70 - 99 mg/dL 141   163   BUN 6 - 20 mg/dL 15   18   Creatinine 0.61 - 1.24 mg/dL 0.87  0.98  0.94   Sodium 135 - 145 mmol/L 135   135   Potassium 3.5 - 5.1 mmol/L 4.0   3.8   Chloride 98 - 111 mmol/L 107   104   CO2 22 - 32 mmol/L 21   21   Calcium 8.9 - 10.3 mg/dL 9.0   8.9   Total Protein 6.5 - 8.1 g/dL 6.9     Total Bilirubin 0.3 - 1.2 mg/dL 0.3     Alkaline Phos 38 - 126 U/L 49     AST 15 - 41 U/L 17     ALT 0 - 44 U/L 16       MR FOOT LEFT W WO CONTRAST  Result Date: 11/18/2022 CLINICAL DATA:  Osteomyelitis EXAM: MRI OF THE LEFT FOREFOOT WITHOUT  AND WITH CONTRAST TECHNIQUE: Multiplanar, multisequence MR imaging of the left forefoot was performed both before and after administration of intravenous contrast. CONTRAST:  45mL GADAVIST GADOBUTROL 1 MMOL/ML IV SOLN COMPARISON:  X-ray 11/15/2022 FINDINGS: Bones/Joint/Cartilage Severe changes of acute osteomyelitis of the second ray with bony destruction of the second toe and distal aspect of the second metatarsal. Abnormal marrow edema and confluent low T1 signal changes of the second metatarsal extend to the metatarsal base. Bone marrow edema and intermediate to low T1 signal changes involving the third metatarsal as well as the third toe proximal phalanx, suspicious for osteomyelitis. There is a small third MTP joint effusion suggestive of septic arthritis. Bone marrow edema and enhancement within the first metatarsal head and neck and within the proximal phalanx of the great toe. Complex first MTP joint effusion. Findings are suspicious for septic arthritis with osteomyelitis. There is a well-defined marginal erosion along the medial aspect of the first metatarsal head which may be related to osteomyelitis or underlying crystalline arthropathy such as gout. Marrow edema is present within the fifth metatarsal head and base of the fifth toe proximal phalanx. Osteomyelitis at these locations is not excluded. Preservation of the bone marrow signal within the imaged midfoot. No malalignment. Ligaments Intact Lisfranc ligament. Disruption of the collateral ligaments of the second MTP joint and within the second toe. Muscles and Tendons Diffuse myositis throughout the forefoot centered at the second metatarsal. Prominent flexor tenosynovitis of the second toe. Soft tissues Marked changes of cellulitis of the second toe and forefoot. Lobulated fluid collection surrounds the area of bony destruction along the course of the second ray. Approximate measurements of 4.5 x 0.9 x 1.7 cm. Additional smaller abscess along the  dorsomedial margin of the second MTP joint measuring approximately 1.9 x 0.7 x 0.9 cm. This collection appears contiguous with the second MTP joint effusion. IMPRESSION: LEFT FOOT: 1. Severe changes of acute osteomyelitis of the second ray with bony destruction of the second toe and distal aspect of the second metatarsal. Bony involvement of the second ray extends proximally to the second metatarsal base. Septic arthritis of the second MTP joint and interphalangeal joints of the second toe. 2. Acute osteomyelitis of the third metatarsal as well as the third toe proximal phalanx with septic arthritis of the third MTP joint. 3. Bone marrow edema and enhancement within the first metatarsal head and neck and within the proximal phalanx of the great toe. Complex first MTP joint effusion. Findings are suspicious for septic  arthritis with osteomyelitis. 4. Marrow edema is present within the fifth metatarsal head and base of the fifth toe proximal phalanx. Osteomyelitis at these locations is not excluded. 5. Marked changes of cellulitis of the second toe and forefoot. Lobulated fluid collection surrounds the area of bony destruction along the course of the second ray. Approximate measurements of 4.5 x 0.9 x 1.7 cm. Additional smaller abscess along the dorsomedial margin of the second MTP joint measuring approximately 1.9 x 0.7 x 0.9 cm. 6. Diffuse myositis throughout the forefoot centered at the second metatarsal. 7. Prominent flexor tenosynovitis of the second toe. Electronically Signed   By: Davina Poke D.O.   On: 11/18/2022 10:34   MR FOOT RIGHT W WO CONTRAST  Result Date: 11/18/2022 CLINICAL DATA:  Diabetic foot wound EXAM: MRI OF THE RIGHT FOREFOOT WITHOUT AND WITH CONTRAST TECHNIQUE: Multiplanar, multisequence MR imaging of the right forefoot was performed before and after the administration of intravenous contrast. CONTRAST:  62mL GADAVIST GADOBUTROL 1 MMOL/ML IV SOLN COMPARISON:  X-ray 11/15/2022 FINDINGS:  Bones/Joint/Cartilage Acute osteomyelitis of the middle and distal phalanx of the second toe with bony destruction. Confluent low T1 marrow signal of the residual bone. Small joint effusion of the PIP joint of the second toe is suspicious for septic arthritis. Bone marrow edema within the proximal phalanx of the second toe is suggestive of early osteomyelitis. Bone marrow edema within the second metatarsal head. Bone marrow edema within the diaphysis of the fourth metatarsal may be reactive or stress related. There is a old healed displaced fracture of the fifth metatarsal shaft without residual marrow edema. There are focal erosions involving the bases of the fourth, fifth, and first metatarsals (series 11, images 20-21). Prominent bone marrow edema within the lateral malleolus seen at the edge of the field of view (series 9, image 10). Ligaments Intact Lisfranc ligament. No evidence of acute collateral ligament injury. Muscles and Tendons Appearance of the foot musculature suggests denervation and/or myositis. Tenosynovitis of the flexor tendon of the second toe distally. Soft tissues Soft tissue wound involving the second toe with prominent cellulitis. No additional soft tissue wounds. No organized or drainable fluid collections. IMPRESSION: RIGHT FOOT: 1. Acute osteomyelitis of the middle and distal phalanx of the second toe with bony destruction. Small joint effusion of the PIP joint of the second toe is suspicious for septic arthritis. Bone marrow edema within the proximal phalanx of the second toe is suggestive of early osteomyelitis. 2. Bone marrow edema within the second metatarsal head. There is also marrow edema within the fourth metatarsal shaft. These findings are more likely to be be reactive or stress related. 3. Prominent bone marrow edema within the lateral malleolus seen at the edge of the field of view. Correlate for soft tissue infection at this location as osteomyelitis is not excluded. 4. Small  focal erosions involving the bases of the fourth, fifth, and first metatarsals. Findings are nonspecific and could reflect underlying inflammatory arthropathy or sequela of prior osteomyelitis. Acute osteomyelitis at these locations is felt to be less likely given lack of adjacent soft tissue inflammation. 5. Tenosynovitis of the flexor tendon of the second toe distally. 6. Appearance of the foot musculature suggests denervation and/or myositis. Electronically Signed   By: Davina Poke D.O.   On: 11/18/2022 10:09     Signed: Buddy Duty, D.O.  Internal Medicine Resident, PGY-2 [facility]  Pager: [phone] 9:53 AM, 12/03/2022

## 2022-12-02 NOTE — Progress Notes (Signed)
Mobility Specialist Progress Note:   12/02/22 0905  Mobility  Activity Ambulated independently in hallway  Level of Assistance Independent  Assistive Device None  Distance Ambulated (ft) 150 ft  RLE Weight Bearing WBAT  LLE Weight Bearing WBAT  Activity Response Tolerated well  Mobility Referral Yes  $Mobility charge 1 Mobility   Pt received in bed and agreeable. No complaints. Pt returned to bed with all needs met and call bell in reach.   Andrey Campanile Mobility Specialist Please contact via SecureChat or  Rehab office at [phone]

## 2022-12-02 NOTE — TOC Progression Note (Signed)
Transition of Care [facility]) - Progression Note    Patient Details  Name: Carl Conley MRN: [id number] Date of Birth: [birth year]  [facility]) CM/SW Contact  Carles Collet, RN Phone Number: 12/02/2022, 12:05 PM  Clinical Narrative:    Contacted by Dr Raymondo Band, resident, for update on DC plans.  Patient not approved for LOG to SNF, per handoff.  Updated Pam w [facility] that plans will be to DC to home tomorrow. Pam will plan accordingly. Confirmed w Barb Merino that they have accepted the patient for [facility] RN and informed Tommi Rumps that plans will be for patient to DC tomorrow. Teaching service team will update patient on afternoon rounds of plans for DC tomorrow after PICC line and IV meds teaching complete.   Expected Discharge Plan: Home/Self Care Barriers to Discharge: Continued Medical Work up  Expected Discharge Plan and Services In-house Referral: Clinical Social Work Discharge Planning Services: CM Consult Post Acute Care Choice: NA Living arrangements for the past 2 months: Single Family Home                 DME Arranged: Walker rolling DME Agency: [employer] Date DME Agency Contacted: 11/29/22 Time DME Agency Contacted: 1254 Representative spoke with at DME Agency: [place]: RN [place]: [facility] ([place]) Date [place]: 11/23/22 Time [place]: [place] Representative spoke with at [place]: [place] ([place]) Interventions SDOH Screenings   Food Insecurity: No Food Insecurity (11/26/2022)  Housing: Low Risk  (11/26/2022)  Transportation Needs: No Transportation Needs (11/26/2022)  Utilities: Not At Risk (11/26/2022)  Depression (PHQ2-9): Low Risk  (07/17/2021)  Tobacco Use: Low Risk  (11/27/2022)    Readmission Risk Interventions     No data to display

## 2022-12-02 NOTE — Progress Notes (Signed)
   HD#15 SUBJECTIVE:  Patient Summary: Carl Conley is a 37 y.o. with a pertinent PMH of T2DM, NAFLD, ADHD, and Depression, who presented with bilateral foot wounds and admitted for bilateral osteomyelitis   Overnight Events: No acute events overnight  Interim History: The patient is pretty upset that he cannot discharge to home today. He has no pain and is just eager to get home. Ate breakfast without any issues.   OBJECTIVE:  Vital Signs: Vitals:   12/01/22 1746 12/01/22 1904 12/02/22 0414 12/02/22 0700  BP: 122/81 97/68 97/63  103/73  Pulse: 72 76 61 67  Resp: 18 16 16 18   Temp:  98.3 F (36.8 C) 98 F (36.7 C) (!) 97.5 F (36.4 C)  TempSrc:  Oral Oral Oral  SpO2: 100% 100% 99% 100%  Weight:      Height:       Supplemental O2: Room Air SpO2: 100 % O2 Flow Rate (L/min): 10 L/min  Filed Weights   11/22/22 1437 11/26/22 2030  Weight: 101 kg 98.9 kg     Intake/Output Summary (Last 24 hours) at 12/02/2022 1234 Last data filed at 12/01/2022 2210 Gross per 24 hour  Intake --  Output 600 ml  Net -600 ml   Net IO Since Admission: -22,505.26 mL [12/02/22 1234]  Physical Exam: General: No acute distress. CV: RRR. No murmurs, rubs, or gallops.  Pulmonary: Lungs CTAB. Normal effort.  Extremities: s/p amputation of R 2nd toe at MTP joint and L TMA. Feet covered in dressings Skin: Warm and dry.  Neuro: A&Ox3. No focal deficit. Psych: Normal mood and affect     ASSESSMENT/PLAN:  Assessment: Principal Problem:   Diabetic osteomyelitis (HCC) Active Problems:   Osteomyelitis of left foot (HCC)   Plan: Bilateral feet osteomyelitis 2/2 diabetic neuropathy s/p bilateral partial lower extremity amputations S/p amputation of R 2nd toe at MTP joint, R distal fibula biopsy, debridement of R ankle, and L TMA. ID recommending outpatient IV cefepime, PICC line was placed and OPAT orders placed. Podiatry re-evaluated the patient yesterday evening and patient is WBAT now. Will  discharge tomorrow once outpatient abx set up (unable to do so today).  - Plan for f/u ABI in 1 year - Continue IV Cefepime per pharmacy.  PICC line and OPAT orders placed.   - Scheduled Tylenol 1000 3 times daily, oxycodone 10 mg prn every 8 for severe pain - PT/OT recommended home health   Type 2 diabetes A1c 6.5%. On Invokamet 150-1000mg  BID at home. - SSI, CBG goal <180    Best Practice: Diet: Diabetic diet IVF: Fluids: none VTE: rivaroxaban (XARELTO) tablet 10 mg Start: 11/27/22 1700 Code: Full AB: Cefepime Therapy Recs: Home Health, DME: bedside commode and walker DISPO: Anticipated discharge tomorrow to Home pending  outpatient abx .  Signature: Buddy Duty, D.O.  Internal Medicine Resident, PGY-2 [facility]  Pager: [phone] 12:34 PM, 12/02/2022   Please contact the on call pager after 5 pm and on weekends at [phone].

## 2022-12-03 DIAGNOSIS — L8990 Pressure ulcer of unspecified site, unspecified stage: Secondary | ICD-10-CM | POA: Insufficient documentation

## 2022-12-03 LAB — GLUCOSE, CAPILLARY
Glucose-Capillary: 157 mg/dL — ABNORMAL HIGH (ref 70–99)
Glucose-Capillary: 162 mg/dL — ABNORMAL HIGH (ref 70–99)
Glucose-Capillary: 104 mg/dL — ABNORMAL HIGH (ref 70–99)

## 2022-12-03 MED ORDER — HEPARIN SOD (PORK) LOCK FLUSH 100 UNIT/ML IV SOLN
250.0000 [IU] | INTRAVENOUS | Status: AC | PRN
  Administered 2022-12-03: 250 [IU]

## 2022-12-03 MED ORDER — ROSUVASTATIN CALCIUM 20 MG PO TABS: 20.0000 mg | ORAL_TABLET | Freq: Every day | ORAL | 1 refills | Status: DC

## 2022-12-03 NOTE — TOC Progression Note (Signed)
Transition of Care [facility]) - Progression Note    Patient Details  Name: Carl Conley MRN: [id number] Date of Birth: 1986/07/15  Transition of Care [facility]) CM/SW [place], RN Phone Number: 12/03/2022, 9:56 AM  Clinical Narrative:    CM called and spoke with Carolynn Sayers, RNCM with [employer] and she plans to meet with the patient for IV home antibiotics teaching at the bedside between 2 pm and 3 pm today.  Bedside nursing was informed to give IV antibiotics today at 2 pm and discharge to home thereafter since Amertias will need to complete teaching at the bedside and home antibiotics to be delivered to the home after 2 pm today.  [name] is providing home health RN for PICC line care.   Expected Discharge Plan: Home/Self Care Barriers to Discharge: Continued Medical Work up  Expected Discharge Plan and Services In-house Referral: Clinical Social Work Discharge Planning Services: CM Consult Post Acute Care Choice: NA Living arrangements for the past 2 months: Single Family Home Expected Discharge Date: 12/02/22               DME Arranged: Gilford Rile rolling DME Agency: [employer] Date DME Agency Contacted: 11/29/22 Time DME Agency Contacted: 1254 Representative spoke with at DME Agency: [place]: RN [place]: [facility] ([place]) Date [place]: 11/23/22 Time [place]: [place] Representative spoke with at [place]: [place] ([place]) Interventions SDOH Screenings   Food Insecurity: No Food Insecurity (11/26/2022)  Housing: Low Risk  (11/26/2022)  Transportation Needs: No Transportation Needs (11/26/2022)  Utilities: Not At Risk (11/26/2022)  Depression (PHQ2-9): Low Risk  (07/17/2021)  Tobacco Use: Low Risk  (11/27/2022)    Readmission Risk Interventions     No data to display

## 2022-12-03 NOTE — Progress Notes (Signed)
Occupational Therapy Treatment Patient Details Name: Carl Conley MRN: [id number] DOB: February 24, 1986 Today's Date: 12/03/2022   History of present illness 37 y/o male presents with B foot wounds 11/17/21 diagnosed with osteomyelitis B feet with MRI .  Underwent S/P right second toe amputation, debridement and bone biopsy of the later malleolus wound right  LE.   Left foot also underwent initial partial 2nd ray amputation, 3rd metatarsal head bone biopsy, and 1st metatarsal head bone biopsy.  Transmetatarsal amputation of the left foot completed 1/18.  PMHx: DM, visual snow syndrome, corneal ulcer of the right eye.   OT comments  Patient continues to make good gains with OT treatment. Patient has met goals for toilet transfers and self care with supervision due to WB change and improved balance, although patient continues to rely on external support for safety. Patient is expected to discharge today to home to receive HHOT/PT to continue to address mobility and self care.    Recommendations for follow up therapy are one component of a multi-disciplinary discharge planning process, led by the attending physician.  Recommendations may be updated based on patient status, additional functional criteria and insurance authorization.    Follow Up Recommendations  Home health OT     Assistance Recommended at Discharge Frequent or constant Supervision/Assistance  Patient can return home with the following  A little help with walking and/or transfers;A little help with bathing/dressing/bathroom;Assistance with cooking/housework;Assist for transportation;Help with stairs or ramp for entrance   Equipment Recommendations  BSC/3in1    Recommendations for Other Services      Precautions / Restrictions Precautions Precautions: Fall Precaution Comments: patient able to recall precautions Required Braces or Orthoses: Other Brace Other Brace: cam boot LLE and post op shoe on the right Restrictions Weight  Bearing Restrictions: Yes RLE Weight Bearing: Weight bearing as tolerated LLE Weight Bearing: Weight bearing as tolerated       Mobility Bed Mobility Overal bed mobility: Modified Independent             General bed mobility comments: no assistance needed with bed mobility    Transfers Overall transfer level: Needs assistance Equipment used: Rolling walker (2 wheels) Transfers: Sit to/from Stand Sit to Stand: Supervision           General transfer comment: supervision with transfers using RW for safety     Balance Overall balance assessment: Needs assistance Sitting-balance support: Feet unsupported, No upper extremity supported Sitting balance-Leahy Scale: Normal     Standing balance support: Single extremity supported, Bilateral upper extremity supported, During functional activity Standing balance-Leahy Scale: Poor (progressing to fair) Standing balance comment: continues to be reliant on external support for balance and safety                           ADL either performed or assessed with clinical judgement   ADL Overall ADL's : Needs assistance/impaired     Grooming: Wash/dry hands;Wash/dry face;Supervision/safety;Standing           Upper Body Dressing : Supervision/safety;Sitting Upper Body Dressing Details (indicate cue type and reason): donn shirt Lower Body Dressing: Minimal assistance;Sitting/lateral leans Lower Body Dressing Details (indicate cue type and reason): donned pants without assistance, required min assist to donn CAM boot on LLE Toilet Transfer: Supervision/safety;Rolling walker (2 wheels);Regular Toilet;Ambulation Toilet Transfer Details (indicate cue type and reason): supervision for safety Toileting- Clothing Manipulation and Hygiene: Modified independent;Sitting/lateral lean         General ADL  Comments: required asssitance with last strap to donn CAM boot and able to doff without assistance    Extremity/Trunk  Assessment              Vision       Perception     Praxis      Cognition Arousal/Alertness: Awake/alert Behavior During Therapy: WFL for tasks assessed/performed Overall Cognitive Status: Within Functional Limits for tasks assessed                                 General Comments: able to recall WB precautions        Exercises      Shoulder Instructions       General Comments      Pertinent Vitals/ Pain       Pain Assessment Pain Assessment: Faces Faces Pain Scale: Hurts a little bit Pain Location: LLE Pain Descriptors / Indicators: Grimacing Pain Intervention(s): Monitored during session, Repositioned  Home Living                                          Prior Functioning/Environment              Frequency  Min 2X/week        Progress Toward Goals  OT Goals(current goals can now be found in the care plan section)  Progress towards OT goals: Progressing toward goals  Acute Rehab OT Goals Patient Stated Goal: go home OT Goal Formulation: With patient Time For Goal Achievement: 12/07/22 Potential to Achieve Goals: Good ADL Goals Pt Will Perform Grooming: with supervision;standing Pt Will Perform Lower Body Bathing: with supervision;sit to/from stand Pt Will Perform Lower Body Dressing: with supervision;sit to/from stand Pt Will Transfer to Toilet: ambulating;with supervision;bedside commode Pt Will Perform Toileting - Clothing Manipulation and hygiene: with supervision;sit to/from stand  Plan Discharge plan remains appropriate    Co-evaluation                 AM-PAC OT "6 Clicks" Daily Activity     Outcome Measure   Help from another person eating meals?: None Help from another person taking care of personal grooming?: A Little Help from another person toileting, which includes using toliet, bedpan, or urinal?: A Little Help from another person bathing (including washing, rinsing, drying)?: A  Little Help from another person to put on and taking off regular upper body clothing?: None Help from another person to put on and taking off regular lower body clothing?: A Little 6 Click Score: 20    End of Session Equipment Utilized During Treatment: Rolling walker (2 wheels)  OT Visit Diagnosis: Unsteadiness on feet (R26.81);Muscle weakness (generalized) (M62.81);Other abnormalities of gait and mobility (R26.89)   Activity Tolerance Patient tolerated treatment well   Patient Left in bed;with call bell/phone within reach   Nurse Communication Mobility status        Time: 5009-3818 OT Time Calculation (min): 25 min  Charges: OT General Charges $OT Visit: 1 Visit OT Treatments $Self Care/Home Management : 23-37 mins  Lodema Hong, [place]  Office [phone]   Trixie Dredge 12/03/2022, 9:29 AM

## 2022-12-03 NOTE — Plan of Care (Signed)
  Problem: Education: Goal: Knowledge of General Education information will improve Description: Including pain rating scale, medication(s)/side effects and non-pharmacologic comfort measures Outcome: Progressing   Problem: Pain Managment: Goal: General experience of comfort will improve Outcome: Progressing   Problem: Safety: Goal: Ability to remain free from injury will improve Outcome: Progressing   Problem: Education: Goal: Knowledge of General Education information will improve Description: Including pain rating scale, medication(s)/side effects and non-pharmacologic comfort measures Outcome: Progressing   Problem: Coping: Goal: Level of anxiety will decrease Outcome: Progressing   Problem: Pain Managment: Goal: General experience of comfort will improve Outcome: Progressing

## 2022-12-04 ENCOUNTER — Telehealth: Payer: Self-pay

## 2022-12-04 NOTE — Telephone Encounter (Signed)
Transition Care Management Unsuccessful Follow-up Telephone Call  Date of discharge and from where:  [facility] 12/03/2022  Attempts:  1st Attempt  Reason for unsuccessful TCM follow-up call:  No answer/busy Carl Conley, [place] Direct Dial [phone]

## 2022-12-05 NOTE — Progress Notes (Signed)
Pt medications left in the pharmacy. Pharmacy gives permission for pt to be given his medications back from [place]

## 2022-12-05 NOTE — Telephone Encounter (Signed)
Transition Care Management Unsuccessful Follow-up Telephone Call  Date of discharge and from where:  [facility] 12/03/2022  Attempts:  2nd Attempt  Reason for unsuccessful TCM follow-up call:  No answer/busy Juanda Crumble, [place] Direct Dial [phone]

## 2022-12-06 NOTE — Telephone Encounter (Signed)
Transition Care Management Follow-up Telephone Call Date of discharge and from where: [facility] 12/03/2022 How have you been since you were released from the hospital? better Any questions or concerns? No  Items Reviewed: Did the pt receive and understand the discharge instructions provided? Yes  Medications obtained and verified? Yes  Other? No  Any new allergies since your discharge? No  Dietary orders reviewed? Yes Do you have support at home? Yes   Home Care and Equipment/Supplies: Were home health services ordered? no If so, what is the name of the agency? N/a  Has the agency set up a time to come to the patient's home? no Were any new equipment or medical supplies ordered?  No What is the name of the medical supply agency? N/a Were you able to get the supplies/equipment? no Do you have any questions related to the use of the equipment or supplies? No  Functional Questionnaire: (I = Independent and D = Dependent) ADLs: I  Bathing/Dressing- I  Meal Prep- I  Eating- I  Maintaining continence- I  Transferring/Ambulation- I  Managing Meds- I  Follow up appointments reviewed:  PCP Hospital f/u appt confirmed? Yes  Scheduled to see Dr Johnney Ou on 12/12/22 @ 3:45. [facility] f/u appt confirmed? No   Are transportation arrangements needed? No  If their condition worsens, is the pt aware to call PCP or go to the Emergency Dept.? Yes Was the patient provided with contact information for the PCP's office or ED? Yes Was to pt encouraged to call back with questions or concerns? Yes Juanda Crumble, LPN [place] Direct Dial [phone]

## 2022-12-12 ENCOUNTER — Ambulatory Visit (INDEPENDENT_AMBULATORY_CARE_PROVIDER_SITE_OTHER): Payer: Medicaid Other | Admitting: Student

## 2022-12-12 ENCOUNTER — Encounter (HOSPITAL_BASED_OUTPATIENT_CLINIC_OR_DEPARTMENT_OTHER): Payer: Medicaid Other | Attending: General Surgery | Admitting: General Surgery

## 2022-12-12 VITALS — BP 101/64 | HR 99 | Temp 98.0°F | Ht 71.0 in | Wt 222.3 lb

## 2022-12-12 DIAGNOSIS — Z89432 Acquired absence of left foot: Secondary | ICD-10-CM | POA: Diagnosis not present

## 2022-12-12 DIAGNOSIS — M86171 Other acute osteomyelitis, right ankle and foot: Secondary | ICD-10-CM

## 2022-12-12 DIAGNOSIS — E1140 Type 2 diabetes mellitus with diabetic neuropathy, unspecified: Secondary | ICD-10-CM | POA: Insufficient documentation

## 2022-12-12 DIAGNOSIS — E11621 Type 2 diabetes mellitus with foot ulcer: Secondary | ICD-10-CM | POA: Insufficient documentation

## 2022-12-12 DIAGNOSIS — Z7984 Long term (current) use of oral hypoglycemic drugs: Secondary | ICD-10-CM

## 2022-12-12 DIAGNOSIS — M86172 Other acute osteomyelitis, left ankle and foot: Secondary | ICD-10-CM | POA: Diagnosis not present

## 2022-12-12 DIAGNOSIS — L97312 Non-pressure chronic ulcer of right ankle with fat layer exposed: Secondary | ICD-10-CM | POA: Diagnosis not present

## 2022-12-12 DIAGNOSIS — M869 Osteomyelitis, unspecified: Secondary | ICD-10-CM

## 2022-12-12 DIAGNOSIS — E1169 Type 2 diabetes mellitus with other specified complication: Secondary | ICD-10-CM | POA: Diagnosis not present

## 2022-12-12 DIAGNOSIS — Z139 Encounter for screening, unspecified: Secondary | ICD-10-CM

## 2022-12-12 DIAGNOSIS — Z89421 Acquired absence of other right toe(s): Secondary | ICD-10-CM | POA: Insufficient documentation

## 2022-12-12 DIAGNOSIS — F32A Depression, unspecified: Secondary | ICD-10-CM

## 2022-12-12 NOTE — Patient Instructions (Signed)
Thank you, Gala Lewandowsky for allowing [place] to provide your care today. Today we discussed.  Foot wounds Please continue to follow with wound are and home nurse. If you find yourself having difficulties getting antibiotics or problems getting any supplies please call us. Please follow up with infectious disease  Please call this number to reapply for medicaid  [phone]  Depression  Please follow up with Wyatt Portela in our clinic. Please continue to take your celexa     I have ordered the following labs for you:  Lab Orders  No laboratory test(s) ordered today     Referrals ordered today:   Referral Orders  No referral(s) requested today     I have ordered the following medication/changed the following medications:   Stop the following medications: There are no discontinued medications.   Start the following medications: No orders of the defined types were placed in this encounter.    Follow up:  2 months     Should you have any questions or concerns please call the internal medicine clinic at [phone].    Sanjuana Letters, D.O. [name]

## 2022-12-13 ENCOUNTER — Encounter: Payer: Self-pay | Admitting: Podiatry

## 2022-12-13 ENCOUNTER — Telehealth: Payer: Self-pay | Admitting: *Deleted

## 2022-12-13 ENCOUNTER — Telehealth: Payer: Self-pay | Admitting: Podiatry

## 2022-12-13 ENCOUNTER — Other Ambulatory Visit: Payer: Self-pay | Admitting: Pharmacist

## 2022-12-13 DIAGNOSIS — Z139 Encounter for screening, unspecified: Secondary | ICD-10-CM | POA: Insufficient documentation

## 2022-12-13 NOTE — Telephone Encounter (Signed)
pts neighbor cancelled POV #1 appt today; pt unable to come in due to lack of transportation.. pt was seen with wound care yesterday and was rebandaged. He will follow up as needed.

## 2022-12-13 NOTE — Telephone Encounter (Signed)
Pt seen yesterday by [place] obtained labs on 12/10/21 [facility] attempting to obtain results Message left on Bobbi's recorder [facility] Nurse)for return call     Per MD, results was being sent to Infectious Disease to review.  No further action needed. Phone call complete.Despina Hidden Cassady2/12/20242:35 PM

## 2022-12-13 NOTE — Assessment & Plan Note (Signed)
Assessment: Patient is currently uninsured and recently had hospitalization for osteomyelitis w/ left TMA and right 2nd toe amputation. He lives with his elderly mother who he cares for and has minimal support. He recently was in in [facility] and no longer has consistent transportation. His neighbor is helping him get to and from appointments when able.   Unfortunately he was denied for medicaid and will call to reapply. Has many social barriers and upcoming appointments with ID, podiatry, and wound care. He has many needs at home. Will continue to get resources as able including charity care.   Plan: - continue to follow up medicaid status - will discuss with social work team to see what resources are available for him in regards to transportation and support with uninsured status

## 2022-12-13 NOTE — Progress Notes (Signed)
MALOSI, HEMSTREET ([id number]) [id number] Page 1 of 8 Visit Report for 12/12/2022 Chief Complaint Document Details Patient Name: Date of Service: Carl Conley, Carl IN S. 12/12/2022 9:00 A M Medical Record Number: [id number] Patient Account Number: [id number] Date of Birth/Sex: Treating RN: Mar 26, 1986 (37 y.o. M) Primary Care Provider: Riesa Pope Other Clinician: Referring Provider: Treating Provider/Extender: Graylin Shiver, Vasilios Weeks in Treatment: 0 Information Obtained from: Patient Chief Complaint Patients presents for treatment of a open diabetic ulcers after bilateral partial amputations (feet) Electronic Signature(s) Signed: 12/12/2022 12:13:40 PM By: Fredirick Maudlin MD FACS Previous Signature: 12/12/2022 9:40:14 AM Version By: Fredirick Maudlin MD FACS Entered By: Fredirick Maudlin on 12/12/2022 12:13:40 -------------------------------------------------------------------------------- HPI Details Patient Name: Date of Service: Carl Conley IN S. 12/12/2022 9:00 A M Medical Record Number: [id number] Patient Account Number: [id number] Date of Birth/Sex: Treating RN: Feb 10, 1986 (37 y.o. M) Primary Care Provider: Riesa Pope Other Clinician: Referring Provider: Treating Provider/Extender: Graylin Shiver, Vasilios Weeks in Treatment: 0 History of Present Illness HPI Description: ADMISSION 12/12/2022 This is a 37 year old type II diabetic (last hemoglobin A1c 6.5% on November 01, 2022). He was admitted to the hospital on November 17, 2022 with bilateral foot wounds. They had apparently been present for quite some time but worsened and plain films done as an outpatient showed osteomyelitis. Because of this finding, he was referred to the emergency department for admission and further evaluation and management. He subsequently underwent left transmetatarsal amputation, bone debridement of the right ankle and biopsy of  the distal fibula and amputation of the right second toe. 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 While in the hospital, he received IV antibiotics and was discharged with another 4 weeks of IV cefepime via PICC line. Pathology from his operations demonstrated necrotizing inflammation and acute osteomyelitis. He has not yet had follow-up with podiatry; this is scheduled for tomorrow. His amputation sites are well-approximated with sutures and staples. The ankle wound has Integra in place; the silicone backing is peeling away. There is no concern for infection. Electronic Signature(s) Signed: 12/12/2022 12:15:33 PM By: Fredirick Maudlin MD FACS Previous Signature: 12/12/2022 9:45:52 AM Version By: Fredirick Maudlin MD FACS Entered By: Fredirick Maudlin on 12/12/2022 12:15:33 Lyla Glassing ([id number]) [id number] Page 2 of 8 -------------------------------------------------------------------------------- Physical Exam Details Patient Name: Date of Service: Carl Conley, Carl Conley IN S. 12/12/2022 9:00 A M Medical Record Number: [id number] Patient Account Number: [id number] Date of Birth/Sex: Treating RN: [birth year] (37 y.o. M) Primary Care Provider: Riesa Pope Other Clinician: Referring Provider: Treating Provider/Extender: Graylin Shiver, Vasilios Weeks in Treatment: 0 Constitutional Hypotensive, asymptomatic. . . . No acute distress. Respiratory Normal work of breathing on room air. Notes 12/12/2022: His amputation sites are well-approximated with sutures and staples. The ankle wound has Integra in place; the silicone backing is peeling away. There is no concern for infection. Electronic Signature(s) Signed: 12/12/2022 12:16:25 PM By: Fredirick Maudlin MD FACS Entered By: Fredirick Maudlin on  12/12/2022 12:16:25 -------------------------------------------------------------------------------- Physician Orders Details Patient Name: Date of Service: Carl Conley IN S. 12/12/2022 9:00 A M Medical Record Number: [id number] Patient Account Number: [id number] Date of Birth/Sex: Treating RN: 09-16-1986 (37 y.o. 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 - Dr. Lady Gary RM 1 Return A Wednesday 2/14 @ 2 pm call to cancel appointment if Dr. Annamary Rummage to manage wounds s Bathing/ Shower/ Hygiene May shower with protection but do not get wound dressing(s) wet. Protect dressing(s) with water repellant cover (for example, large plastic bag) or a cast cover and may then take shower. - per Dr. Annamary Rummage Off-Loading Other: - weight bearing to feet per podiatry Wound Treatment Wound #1 - Malleolus Wound Laterality: Right, Lateral Prim Dressing: Medline Woven Gauze Sponges 4x4 (in/in) ary Other:to be changed by podiatry Secured With: Kerlix Roll Sterile, 4.5x3.1 (in/yd) Other:to be changed by podiatry Discharge Instructions: Secure with Kerlix as directed. Electronic Signature(s) JOZSEF, WESCOAT ([id number]) [id number] Page 3 of 8 Signed: 12/12/2022 12:42:58 PM By: Duanne Guess MD FACS Previous Signature: 12/12/2022 12:10:47 PM Version By: Duanne Guess MD FACS Entered By: Duanne Guess on 12/12/2022 12:17:05 -------------------------------------------------------------------------------- Problem List Details Patient Name:  Date of Service: Carl Conley IN S. 12/12/2022 9:00 A M Medical Record Number: [id number] Patient Account Number: [id number] Date of Birth/Sex: Treating RN: October 15, 1986 (37 y.o. 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 12/12/2022 9:00 A M Medical Record Number: [id number] Patient Account Number: [id number] Date of Birth/Sex: Treating RN: 16-Sep-1986 (37 y.o. M) Primary Care Provider: Belva Agee Other Clinician: Referring Provider: Treating Provider/Extender: Kirkland Hun, Vasilios Weeks in Treatment: 0 Subjective Chief Complaint Information obtained from Patient Patients presents for treatment of a open diabetic ulcers after bilateral partial amputations (feet) Carl Conley, Carl Conley ([id number]) [id number] Page 4 of 8 History of Present Illness (HPI) ADMISSION 12/12/2022 This is a 36 year old type II diabetic (last hemoglobin A1c 6.5% on November 01, 2022). He was admitted to the hospital on November 17, 2022 with bilateral foot wounds. They had apparently been present for quite some time but worsened and  plain films done as an outpatient showed osteomyelitis. Because of this finding, he was referred to the emergency department for admission and further evaluation and management. He subsequently underwent left transmetatarsal amputation, bone debridement of the right ankle and biopsy of the distal fibula and amputation of the right second toe. 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 While in the hospital, he received IV antibiotics and was discharged with another 4 weeks of IV cefepime via PICC line. Pathology from his operations demonstrated necrotizing inflammation and acute osteomyelitis. He has not yet had follow-up with podiatry; this is scheduled for tomorrow. His amputation sites are well-approximated with sutures and staples. The ankle wound has Integra in place; the silicone backing is peeling away. There is no concern for infection. Patient History Information obtained from Patient. Allergies doxycycline (Reaction: nausea/vomiting)  Family History No family history of Cancer, Diabetes, Heart Disease, Hereditary Spherocytosis, Hypertension, Kidney Disease, Lung Disease, Seizures, Stroke, Thyroid Problems, Tuberculosis. Social History Never smoker, Marital Status - Single, Alcohol Use - Never, Drug Use - No History, Caffeine Use - Rarely - diet soda. Medical History Eyes Patient has history of Cataracts - bil extracted Denies history of Glaucoma, Optic Neuritis Endocrine Patient has history of Type II Diabetes Denies history of Type I Diabetes Genitourinary Denies history of End Stage Renal Disease Integumentary (Skin) Denies history of History of Burn Musculoskeletal Patient has history of Osteomyelitis - bil feet Neurologic Patient has history of Neuropathy Oncologic Denies history of Received Chemotherapy, Received  Radiation Psychiatric Denies history of Anorexia/bulimia, Confinement Anxiety Patient is treated with Oral Agents. Blood sugar is not tested. Hospitalization/Surgery History - abdonimal a-gram. - left transmet amputation. - right 2nd toe amputation. - wisdom tooth extraction. Review of Systems (ROS) Constitutional Symptoms (General Health) Denies complaints or symptoms of Fatigue, Fever, Chills, Marked Weight Change. Eyes Complains or has symptoms of Glasses / Contacts. Denies complaints or symptoms of Dry Eyes, Vision Changes. Ear/Nose/Mouth/Throat Denies complaints or symptoms of Chronic sinus problems or rhinitis. Respiratory Denies complaints or symptoms of Chronic or frequent coughs, Shortness of Breath. Cardiovascular Denies complaints or symptoms of Chest pain. Gastrointestinal Denies complaints or symptoms of Frequent diarrhea, Nausea, Vomiting. Endocrine Denies complaints or symptoms of Heat/cold intolerance. Genitourinary Denies complaints or symptoms of Frequent urination. Integumentary (Skin) Complains or has symptoms of Wounds - surgical wounds both feet. Musculoskeletal Denies complaints or symptoms of Muscle Pain, Muscle Weakness. Neurologic Complains or has symptoms of Numbness/parasthesias. Psychiatric Denies complaints or symptoms of Claustrophobia. [address] Carl Conley, Carl Conley ([id number]) [id number] Page 5 of 8 Constitutional Hypotensive, asymptomatic. No acute distress. Vitals Time Taken: 9:11 AM, Height: 71 in, Weight: 230 lbs, BMI: 32.1, Temperature: 98.6 F, Pulse: 92 bpm, Respiratory Rate: 20 breaths/min, Blood Pressure: 82/57 mmHg. General Notes: pt does not check blood sugars at home Respiratory Normal work of breathing on room air. General Notes: 12/12/2022: His amputation sites are well-approximated with sutures and staples. The ankle wound has Integra in place; the silicone backing is peeling away. There is no concern  for infection. Integumentary (Hair, Skin) Wound #1 status is Open. Original cause of wound was Gradually Appeared. The date acquired was: 05/05/2022. The wound is located on the Right,Lateral Malleolus. The wound measures 2.8cm length x 2.5cm width x 0.1cm depth; 5.498cm^2 area and 0.55cm^3 volume. There is no tunneling or undermining noted. There is a medium amount of serous drainage noted. The wound margin is flat and intact. There is small (1-33%) pink granulation within the wound bed. There is no necrotic tissue within the wound bed. The periwound skin appearance had no abnormalities noted for texture. The periwound skin appearance had no abnormalities noted for moisture. The periwound skin appearance had no abnormalities noted for color. Periwound temperature was noted as No Abnormality. 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 - Dr. Celine Ahr RM 1 Wednesday 2/14 @ 2 pm call to cancel appointment if Dr. Loel Lofty to manage wounds s Bathing/ Shower/ Hygiene: May shower with protection but do not get wound dressing(s) wet. Protect dressing(s) with water repellant cover (for example, large plastic bag) or a cast cover and may then take  shower. - per Dr. Loel Lofty Off-Loading: Other: - weight bearing to feet per podiatry WOUND #1: - Malleolus Wound Laterality: Right, Lateral Prim Dressing: Medline Woven Gauze Sponges 4x4 (in/in) Other:to be changed by podiatry/ ary Secured With: Kerlix Roll Sterile, 4.5x3.1 (in/yd) Other:to be changed by podiatry/ Discharge Instructions: Secure with Kerlix as directed. 12/12/2022: The patient actually made this appointment before he was admitted to the hospital and before undergoing any surgery. His  surgical sites are clean and well-approximated on his feet and Integra still in place on his ankle. He has follow-up tomorrow with Dr. Loel Lofty. At this point, it is not clear whether or not he actually requires our services at all. He will see Dr. Carmina Miller tomorrow. If he would like [place] to take over management of the ankle wound, we are certainly willing and happy to do so. We have tentatively scheduled the patient to follow-up in 1 week, but he may cancel that if Dr. Loel Lofty is going to manage his wounds going forward. Electronic Signature(s) Signed: 12/12/2022 12:19:44 PM By: Fredirick Maudlin MD FACS Entered By: Fredirick Maudlin on 12/12/2022 12:19:44 -------------------------------------------------------------------------------- HxROS Details Patient Name: Date of Service: Carl Conley IN S. 12/12/2022 9:00 A M Medical Record Number: [id number] Patient Account Number: [id number] Date of Birth/Sex: Treating RN: [birth year] (37 y.o. Ernestene Mention Primary Care Provider: Riesa Pope Other Clinician: Referring Provider: Treating Provider/Extender: Graylin Shiver, Vasilios Weeks in Treatment: [address] Carl Conley, Carl Conley ([id number]) [id number] Page 6 of 8 Information Obtained From Patient Constitutional Symptoms (General Health) Complaints and Symptoms: Negative for: Fatigue; Fever; Chills; Marked Weight Change Eyes Complaints and Symptoms: Positive for: Glasses / Contacts Negative for: Dry Eyes; Vision Changes Medical History: Positive for: Cataracts - bil extracted Negative for: Glaucoma; Optic Neuritis Ear/Nose/Mouth/Throat Complaints and Symptoms: Negative for: Chronic sinus problems or rhinitis Respiratory Complaints and Symptoms: Negative for: Chronic or frequent coughs; Shortness of Breath Cardiovascular Complaints and Symptoms: Negative for: Chest pain Gastrointestinal Complaints and Symptoms: Negative for: Frequent  diarrhea; Nausea; Vomiting Endocrine Complaints and Symptoms: Negative for: Heat/cold intolerance Medical History: Positive for: Type II Diabetes Negative for: Type I Diabetes Time with diabetes: since 2005 Treated with: Oral agents Blood sugar tested every day: No Genitourinary Complaints and Symptoms: Negative for: Frequent urination Medical History: Negative for: End Stage Renal Disease Integumentary (Skin) Complaints and Symptoms: Positive for: Wounds - surgical wounds both feet Medical History: Negative for: History of Burn Musculoskeletal Complaints and Symptoms: Negative for: Muscle Pain; Muscle Weakness Medical History: Positive for: Osteomyelitis - bil feet Neurologic Complaints and Symptoms: Positive for: Numbness/parasthesias Medical HistoryMCKINNLEY, Carl Conley ([id number]) [id number] Page 7 of 8 Positive for: Neuropathy Psychiatric Complaints and Symptoms: Negative for: Claustrophobia Medical History: Negative for: Anorexia/bulimia; Confinement Anxiety Hematologic/Lymphatic Immunological Oncologic Medical History: Negative for: Received Chemotherapy; Received Radiation HBO Extended History Items Eyes: Cataracts Immunizations Pneumococcal Vaccine: Received Pneumococcal Vaccination: Yes Received Pneumococcal Vaccination On or After 60th Birthday: No Implantable Devices No devices added Hospitalization / Surgery History Type of Hospitalization/Surgery abdonimal a-gram left transmet amputation right 2nd toe amputation wisdom tooth extraction Family and Social History Cancer: No; Diabetes: No; Heart Disease: No; Hereditary Spherocytosis: No; Hypertension: No; Kidney Disease: No; Lung Disease: No; Seizures: No; Stroke: No; Thyroid Problems: No; Tuberculosis: No; Never smoker; Marital Status - Single; Alcohol Use: Never; Drug Use: No History; Caffeine Use: Rarely - diet soda; Financial Concerns: No; Food, Clothing or Shelter  Needs: No; Support System Lacking: No; Transportation Concerns: Yes - does not have a Personal assistant) Signed:  12/12/2022 12:10:47 PM By: Duanne Guess MD FACS Signed: 12/12/2022 5:56:04 PM By: Zenaida Deed RN, BSN Entered By: Zenaida Deed on 12/12/2022 09:39:14 -------------------------------------------------------------------------------- SuperBill Details Patient Name: Date of Service: Carl Conley, Carl Conley IN S. 12/12/2022 Medical Record Number: [id number] Patient Account Number: [id number] Date of Birth/Sex: Treating RN: [birth year] (37 y.o. 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 FREDRIK, MOGEL ([id number]) [id number] Page 8 of 8 [phone] Acquired absence of other right toe(s) Facility Procedures : CPT4 Code: [id number] Description: 99214 - WOUND CARE VISIT-LEV 4 EST PT Modifier: Quantity: 1 Physician Procedures : CPT4 Code Description Modifier [id number] WC PHYS LEVEL 3 NEW PT ICD-10 Diagnosis Description M86.9 Osteomyelitis, unspecified 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

## 2022-12-13 NOTE — Progress Notes (Signed)
LADARRION, TELFAIR ([id number]) [id number] Page 1 of 9 Visit Report for 12/12/2022 Allergy List Details Patient Name: Date of Service: Carl Conley, Carl Conley IN S. 12/12/2022 9:00 A M Medical Record Number: [id number] Patient Account Number: [id number] Date of Birth/Sex: Treating RN: 29-Oct-1986 (37 y.o. Ernestene Mention Primary Care Shalika Arntz: Riesa Pope Other Clinician: Referring Juanluis Guastella: Treating Deloma Spindle/Extender: Graylin Shiver, Vasilios Weeks in Treatment: 0 Allergies Active Allergies doxycycline Reaction: nausea/vomiting Allergy Notes Electronic Signature(s) Signed: 12/12/2022 5:56:04 PM By: Baruch Gouty RN, BSN Entered By: Baruch Gouty on 12/12/2022 09:32:08 -------------------------------------------------------------------------------- Arrival Information Details Patient Name: Date of Service: Carl Conley IN S. 12/12/2022 9:00 A M Medical Record Number: [id number] Patient Account Number: [id number] Date of Birth/Sex: Treating RN: 1986/06/29 (37 y.o. M) Primary Care Jaaron Oleson: Riesa Pope Other Clinician: Referring Maile Linford: Treating Alanni Vader/Extender: Graylin Shiver, Vasilios Weeks in Treatment: 0 Visit Information Patient Arrived: Walker Arrival Time: 09:11 Accompanied By: self Transfer Assistance: None Patient Identification Verified: Yes Secondary Verification Process Completed: Yes Patient Requires Transmission-Based Precautions: No Patient Has Alerts: No Electronic Signature(s) Signed: 12/12/2022 5:56:04 PM By: Baruch Gouty RN, BSN Entered By: Baruch Gouty on 12/12/2022 09:27:01 -------------------------------------------------------------------------------- Clinic Level of Care Assessment Details Patient Name: Date of Service: Carl Conley, Carl Conley IN S. 12/12/2022 9:00 A Carl Conley, Carl Conley ([id number]) [id number] Page 2 of 9 Medical Record Number: [id number] Patient  Account Number: [id number] Date of Birth/Sex: Treating RN: 07-18-1986 (37 y.o. Ernestene Mention Primary Care Derrik Mceachern: Riesa Pope Other Clinician: Referring Saprina Chuong: Treating Annastacia Duba/Extender: Graylin Shiver, Vasilios Weeks in Treatment: 0 Clinic Level of Care Assessment Items TOOL 2 Quantity Score []  - 0 Use when only an EandM is performed on the INITIAL visit ASSESSMENTS - Nursing Assessment / Reassessment X- 1 20 General Physical Exam (combine w/ comprehensive assessment (listed just below) when performed on new pt. evals) X- 1 25 Comprehensive Assessment (HX, ROS, Risk Assessments, Wounds Hx, etc.) ASSESSMENTS - Wound and Skin A ssessment / Reassessment X - Simple Wound Assessment / Reassessment - one wound 1 5 []  - 0 Complex Wound Assessment / Reassessment - multiple wounds []  - 0 Dermatologic / Skin Assessment (not related to wound area) ASSESSMENTS - Ostomy and/or Continence Assessment and Care []  - 0 Incontinence Assessment and Management []  - 0 Ostomy Care Assessment and Management (repouching, etc.) PROCESS - Coordination of Care X - Simple Patient / Family Education for ongoing care 1 15 []  - 0 Complex (extensive) Patient / Family Education for ongoing care X- 1 10 Staff obtains Programmer, systems, Records, T Results / Process Orders est []  - 0 Staff telephones HHA, Nursing Homes / Clarify orders / etc []  - 0 Routine Transfer to another Facility (non-emergent condition) []  - 0 Routine Hospital Admission (non-emergent condition) X- 1 15 New Admissions / Biomedical engineer / Ordering NPWT Apligraf, etc. , []  - 0 Emergency Hospital Admission (emergent condition) X- 1 10 Simple Discharge Coordination []  - 0 Complex (extensive) Discharge Coordination PROCESS - Special Needs []  - 0 Pediatric / Minor Patient Management []  - 0 Isolation Patient Management []  - 0 Hearing / Language / Visual special needs []  - 0 Assessment of Community  assistance (transportation, D/C planning, etc.) []  - 0 Additional assistance / Altered mentation []  - 0 Support Surface(s) Assessment (bed, cushion, seat, etc.) INTERVENTIONS - Wound Cleansing / Measurement X- 1 5 Wound Imaging (photographs - any number of wounds) []  - 0 Wound Tracing (instead of photographs) X- 1 5 Simple Wound Measurement - one wound []  - 0 Complex Wound Measurement -  multiple wounds []  - 0 Simple Wound Cleansing - one wound []  - 0 Complex Wound Cleansing - multiple wounds INTERVENTIONS - Wound Dressings X - Small Wound Dressing one or multiple wounds 2 10 []  - 0 Medium Wound Dressing one or multiple wounds []  - 0 Large Wound Dressing one or multiple wounds Carl Conley, Carl Conley ([id number]) [id number] Page 3 of 9 []  - 0 Application of Medications - injection INTERVENTIONS - Miscellaneous []  - 0 External ear exam []  - 0 Specimen Collection (cultures, biopsies, blood, body fluids, etc.) []  - 0 Specimen(s) / Culture(s) sent or taken to Lab for analysis []  - 0 Patient Transfer (multiple staff / Harrel Lemon Lift / Similar devices) []  - 0 Simple Staple / Suture removal (25 or less) []  - 0 Complex Staple / Suture removal (26 or more) []  - 0 Hypo / Hyperglycemic Management (close monitor of Blood Glucose) []  - 0 Ankle / Brachial Index (ABI) - do not check if billed separately Has the patient been seen at the hospital within the last three years: Yes Total Score: 130 Level Of Care: New/Established - Level 4 Electronic Signature(s) Signed: 12/12/2022 5:56:04 PM By: Baruch Gouty RN, BSN Entered By: Baruch Gouty on 12/12/2022 10:26:02 -------------------------------------------------------------------------------- Encounter Discharge Information Details Patient Name: Date of Service: Carl Conley IN S. 12/12/2022 9:00 A M Medical Record Number: [id number] Patient Account Number: [id number] Date of Birth/Sex: Treating RN: 19-Nov-1985 (37  y.o. Ernestene Mention Primary Care Brentley Landfair: Riesa Pope Other Clinician: Referring Victory Strollo: Treating Harce Volden/Extender: Graylin Shiver, Vasilios Weeks in Treatment: 0 Encounter Discharge Information Items Discharge Condition: Stable Ambulatory Status: Walker Discharge Destination: Home Transportation: Private Auto Accompanied By: self Schedule Follow-up Appointment: Yes Clinical Summary of Care: Patient Declined Electronic Signature(s) Signed: 12/12/2022 5:56:04 PM By: Baruch Gouty RN, BSN Entered By: Baruch Gouty on 12/12/2022 10:27:30 -------------------------------------------------------------------------------- Lower Extremity Assessment Details Patient Name: Date of Service: Carl Conley IN S. 12/12/2022 9:00 A M Medical Record Number: [id number] Patient Account Number: [id number] Date of Birth/Sex: Treating RN: Jul 25, 1986 (37 y.o. Ernestene Mention Primary Care Cliffie Gingras: Riesa Pope Other Clinician: Referring Denine Brotz: Treating Lacee Grey/Extender: Graylin Shiver, Vasilios Weeks in Treatment: 0 Edema Assessment Assessed: [Left: No] [Right: No] M[LeftZURICH, Carl Conley ([id number] [Right: [id number] Page 4 of 9] Edema: [Left: No] [Right: No] Vascular Assessment Pulses: Dorsalis Pedis Palpable: [Left:Yes] [Right:Yes] Electronic Signature(s) Signed: 12/12/2022 5:56:04 PM By: Baruch Gouty RN, BSN Entered By: Baruch Gouty on 12/12/2022 09:45:23 -------------------------------------------------------------------------------- Multi Wound Chart Details Patient Name: Date of Service: Carl Conley IN S. 12/12/2022 9:00 A M Medical Record Number: [id number] Patient Account Number: [id number] Date of Birth/Sex: Treating RN: [birth year] (37 y.o. M) Primary Care Jessen Siegman: Riesa Pope Other Clinician: Referring Arleigh Dicola: Treating Aamani Moose/Extender: Graylin Shiver,  Vasilios Weeks in Treatment: 0 Vital Signs Height(in): 71 Pulse(bpm): 92 Weight(lbs): 230 Blood Pressure(mmHg): 82/57 Body Mass Index(BMI): 32.1 Temperature(F): 98.6 Respiratory Rate(breaths/min): 20 [1:Photos:] [N/A:N/A] Right, Lateral Malleolus N/A N/A Wound Location: Gradually Appeared N/A N/A Wounding Event: Diabetic Wound/Ulcer of the Lower N/A N/A Primary Etiology: Extremity Cataracts, Type II Diabetes, N/A N/A Comorbid History: Osteomyelitis, Neuropathy 05/05/2022 N/A N/A Date Acquired: 0 N/A N/A Weeks of Treatment: Open N/A N/A Wound Status: No N/A N/A Wound Recurrence: 2.8x2.5x0.1 N/A N/A Measurements L x W x D (cm) 5.498 N/A N/A A (cm) : rea 0.55 N/A N/A Volume (cm) : Grade 3 N/A N/A Classification: Medium N/A N/A Exudate A mount: Serous N/A N/A Exudate Type: amber N/A N/A Exudate Color: Flat and Intact N/A N/A Wound Margin:  Small (1-33%) N/A N/A Granulation A mount: Pink N/A N/A Granulation Quality: None Present (0%) N/A N/A Necrotic A mount: Small (1-33%) N/A N/A Epithelialization: No Abnormalities Noted N/A N/A Periwound Skin Texture: No Abnormalities Noted N/A N/A Periwound Skin Moisture: No Abnormalities Noted N/A N/A Periwound Skin Color: No Abnormality N/A N/A Temperature: unable to visualize wound bed, integra N/A N/A Assessment Notes: in place from the OR Carl Conley, Carl Conley ([id number]) [id number] Page 5 of 9 Treatment Notes Wound #1 (Malleolus) Wound Laterality: Right, Lateral Cleanser Peri-Wound Care Topical Primary Dressing Medline Woven Gauze Sponges 4x4 (in/in) Secondary Dressing Secured With Kerlix Roll Sterile, 4.5x3.1 (in/yd) Discharge Instruction: Secure with Kerlix as directed. Compression Wrap Compression Stockings Add-Ons Electronic Signature(s) Signed: 12/12/2022 12:13:16 PM By: Fredirick Maudlin MD FACS Entered By: Fredirick Maudlin on 12/12/2022  12:13:16 -------------------------------------------------------------------------------- Multi-Disciplinary Care Plan Details Patient Name: Date of Service: Carl Conley IN S. 12/12/2022 9:00 A M Medical Record Number: [id number] Patient Account Number: [id number] Date of Birth/Sex: Treating RN: 29-Jul-1986 (37 y.o. Ernestene Mention Primary Care Eladio Dentremont: Riesa Pope Other Clinician: Referring Gerell Fortson: Treating Rafan Sanders/Extender: Graylin Shiver, Vasilios Weeks in Treatment: 0 Multidisciplinary Care Plan reviewed with physician Active Inactive Abuse / Safety / Falls / Self Care Management Nursing Diagnoses: History of Falls Potential for falls Goals: Patient/caregiver will verbalize/demonstrate measures taken to prevent injury and/or falls Date Initiated: 12/12/2022 Target Resolution Date: 01/09/2023 Goal Status: Active Interventions: Assess fall risk on admission and as needed Assess impairment of mobility on admission and as needed per policy Notes: Nutrition Nursing Diagnoses: Impaired glucose control: actual or potential Goals: Patient/caregiver will maintain therapeutic glucose control Date Initiated: 12/12/2022 Target Resolution Date: 01/09/2023 Goal Status: Active Carl Conley, Carl Conley ([id number]) [id number] Page 6 of 9 Interventions: Assess HgA1c results as ordered upon admission and as needed Assess patient nutrition upon admission and as needed per policy Provide education on elevated blood sugars and impact on wound healing Treatment Activities: Patient referred to Primary Care Physician for further nutritional evaluation : 12/12/2022 Notes: Wound/Skin Impairment Nursing Diagnoses: Impaired tissue integrity Knowledge deficit related to ulceration/compromised skin integrity Goals: Patient/caregiver will verbalize understanding of skin care regimen Date Initiated: 12/12/2022 Target Resolution Date: 01/09/2023 Goal Status:  Active Ulcer/skin breakdown will have a volume reduction of 30% by week 4 Date Initiated: 12/12/2022 Target Resolution Date: 01/09/2023 Goal Status: Active Interventions: Assess patient/caregiver ability to obtain necessary supplies Assess patient/caregiver ability to perform ulcer/skin care regimen upon admission and as needed Assess ulceration(s) every visit Provide education on ulcer and skin care Treatment Activities: Skin care regimen initiated : 12/12/2022 Topical wound management initiated : 12/12/2022 Notes: Electronic Signature(s) Signed: 12/12/2022 5:56:04 PM By: Baruch Gouty RN, BSN Entered By: Baruch Gouty on 12/12/2022 10:26:58 -------------------------------------------------------------------------------- Pain Assessment Details Patient Name: Date of Service: Carl Conley IN S. 12/12/2022 9:00 A M Medical Record Number: [id number] Patient Account Number: [id number] Date of Birth/Sex: Treating RN: June 21, 1986 (37 y.o. M) Primary Care Krystyne Tewksbury: Riesa Pope Other Clinician: Referring Kier Smead: Treating Finleigh Cheong/Extender: Graylin Shiver, Vasilios Weeks in Treatment: 0 Active Problems Location of Pain Severity and Description of Pain Patient Has Paino No Site Locations Carl Conley, Carl Conley [place] ([id number]) [id number] Page 7 of 9 Pain Management and Medication Current Pain Management: Electronic Signature(s) Signed: 12/12/2022 11:05:28 AM By: Worthy Rancher Entered By: Worthy Rancher on 12/12/2022 09:12:48 -------------------------------------------------------------------------------- Patient/Caregiver Education Details Patient Name: Date of Service: Carl Conley, Carl Conley IN S. 2/7/2024andnbsp9:00 [facility] Record Number: [id number] Patient Account Number: [id number] Date of Birth/Gender: Treating RN: 1986-06-26 (37 y.o. Ulyses Amor,  Bonita Quin Primary Care Physician: Belva Agee Other Clinician: Referring Physician: Treating  Physician/Extender: Kirkland Hun, Vasilios Weeks in Treatment: 0 Education Assessment Education Provided To: Patient Education Topics Provided Elevated Blood Sugar/ Impact on Healing: Methods: Explain/Verbal Responses: Reinforcements needed, State content correctly Welcome T [facility]New Patient Packet: o Methods: Explain/Verbal Responses: Reinforcements needed, State content correctly Wound/Skin Impairment: Methods: Explain/Verbal Responses: Reinforcements needed, State content correctly Electronic Signature(s) Signed: 12/12/2022 5:56:04 PM By: Zenaida Deed RN, BSN Entered By: Zenaida Deed on 12/12/2022 10:23:49 Carl Conley ([id number]) [id number] Page 8 of 9 -------------------------------------------------------------------------------- Wound Assessment Details Patient Name: Date of Service: Carl Conley, Carl Conley IN S. 12/12/2022 9:00 A M Medical Record Number: [id number] Patient Account Number: [id number] Date of Birth/Sex: Treating RN: 07/12/1986 (37 y.o. Damaris Schooner Primary Care Imane Burrough: Belva Agee Other Clinician: Referring Ameera Tigue: Treating Dwan Fennel/Extender: Kirkland Hun, Vasilios Weeks in Treatment: 0 Wound Status Wound Number: 1 Primary Etiology: Diabetic Wound/Ulcer of the Lower Extremity Wound Location: Right, Lateral Malleolus Wound Status: Open Wounding Event: Gradually Appeared Comorbid History: Cataracts, Type II Diabetes, Osteomyelitis, Neuropathy Date Acquired: 05/05/2022 Weeks Of Treatment: 0 Clustered Wound: No Photos Wound Measurements Length: (cm) Width: (cm) Depth: (cm) Area: (cm) Volume: (cm) 2.8 % Reduction in Area: 2.5 % Reduction in Volume: 0.1 Epithelialization: Small (1-33%) 5.498 Tunneling: No 0.55 Undermining: No Wound Description Classification: Grade 3 Wound Margin: Flat and Intact Exudate Amount: Medium Exudate Type: Serous Exudate  Color: amber Foul Odor After Cleansing: No Wound Bed Granulation Amount: Small (1-33%) Granulation Quality: Pink Necrotic Amount: None Present (0%) Periwound Skin Texture Texture Color No Abnormalities Noted: Yes No Abnormalities Noted: Yes Moisture Temperature / Pain No Abnormalities Noted: Yes Temperature: No Abnormality Assessment Notes unable to visualize wound bed, integra in place from the OR Treatment Notes Wound #1 (Malleolus) Wound Laterality: Right, Lateral Cleanser Peri-Wound Care Topical Carl Conley, Carl Conley ([id number]) [id number] Page 9 of 9 Primary Dressing Medline Woven Gauze Sponges 4x4 (in/in) Secondary Dressing Secured With Kerlix Roll Sterile, 4.5x3.1 (in/yd) Discharge Instruction: Secure with Kerlix as directed. Compression Wrap Compression Stockings Add-Ons Electronic Signature(s) Signed: 12/12/2022 5:56:04 PM By: Zenaida Deed RN, BSN Entered By: Zenaida Deed on 12/12/2022 09:58:53 -------------------------------------------------------------------------------- Vitals Details Patient Name: Date of Service: Carl Conley IN S. 12/12/2022 9:00 A M Medical Record Number: [id number] Patient Account Number: [id number] Date of Birth/Sex: Treating RN: 1986-09-02 (37 y.o. M) Primary Care Yulonda Wheeling: Belva Agee Other Clinician: Referring Olita Takeshita: Treating Samone Guhl/Extender: Kirkland Hun, Vasilios Weeks in Treatment: 0 Vital Signs Time Taken: 09:11 Temperature (F): 98.6 Height (in): 71 Pulse (bpm): 92 Weight (lbs): 230 Respiratory Rate (breaths/min): 20 Body Mass Index (BMI): 32.1 Blood Pressure (mmHg): 82/57 Reference Range: 80 - 120 mg / dl Notes pt does not check blood sugars at home Electronic Signature(s) Signed: 12/12/2022 5:56:04 PM By: Zenaida Deed RN, BSN Entered By: Zenaida Deed on 12/12/2022 09:31:31

## 2022-12-13 NOTE — Progress Notes (Signed)
HATTIE, AGUINALDO ([id number]) [id number] Page 1 of 4 Visit Report for 12/12/2022 Abuse Risk Screen Details Patient Name: Date of Service: Carl Conley, Carl Conley IN S. 12/12/2022 9:00 A M Medical Record Number: [id number] Patient Account Number: [id number] Date of Birth/Sex: Treating RN: 11-04-1986 (37 y.o. Ernestene Mention Primary Care Loreley Schwall: Riesa Pope Other Clinician: Referring Emmanual Gauthreaux: Treating Anysia Choi/Extender: Graylin Shiver, Vasilios Weeks in Treatment: 0 Abuse Risk Screen Items Answer ABUSE RISK SCREEN: Has anyone close to you tried to hurt or harm you recentlyo No Do you feel uncomfortable with anyone in your familyo No Has anyone forced you do things that you didnt want to doo No Electronic Signature(s) Signed: 12/12/2022 5:56:04 PM By: Baruch Gouty RN, BSN Entered By: Baruch Gouty on 12/12/2022 09:39:26 -------------------------------------------------------------------------------- Activities of Daily Living Details Patient Name: Date of Service: Carl Conley, Carl Conley IN S. 12/12/2022 9:00 A M Medical Record Number: [id number] Patient Account Number: [id number] Date of Birth/Sex: Treating RN: 17-Jul-1986 (37 y.o. Ernestene Mention Primary Care Bryer Cozzolino: Riesa Pope Other Clinician: Referring Ligia Duguay: Treating Elysa Womac/Extender: Graylin Shiver, Vasilios Weeks in Treatment: 0 Activities of Daily Living Items Answer Activities of Daily Living (Please select one for each item) Drive Automobile Completely Able T Medications ake Completely Able Use T elephone Completely Able Care for Appearance Completely Able Use T oilet Completely Able Bath / Shower Completely Able Dress Self Completely Able Feed Self Completely Able Walk Completely Able Get In / Out Bed Completely Able Housework Completely Able Prepare Meals Completely Able Handle Money Completely Able Shop for Self Completely  Able Electronic Signature(s) Signed: 12/12/2022 5:56:04 PM By: Baruch Gouty RN, BSN Entered By: Baruch Gouty on 12/12/2022 09:40:01 Lyla Glassing ([id number]) [id number] Page 2 of 4 -------------------------------------------------------------------------------- Education Screening Details Patient Name: Date of Service: Carl Conley, Carl Conley IN S. 12/12/2022 9:00 A M Medical Record Number: [id number] Patient Account Number: [id number] Date of Birth/Sex: Treating RN: 12-21-1985 (37 y.o. Ernestene Mention Primary Care Tarrell Debes: Riesa Pope Other Clinician: Referring Bobbye Petti: Treating Judas Mohammad/Extender: Graylin Shiver, Vasilios Weeks in Treatment: 0 Primary Learner Assessed: Patient Learning Preferences/Education Level/Primary Language Learning Preference: Explanation, Demonstration, Printed Material Highest Education Level: College or Above Preferred Language: English Cognitive Barrier Language Barrier: No Translator Needed: No Memory Deficit: No Emotional Barrier: No Cultural/Religious Beliefs Affecting Medical Care: No Physical Barrier Impaired Vision: Yes Glasses Impaired Hearing: No Decreased Hand dexterity: No Knowledge/Comprehension Knowledge Level: High Comprehension Level: High Ability to understand written instructions: High Ability to understand verbal instructions: High Motivation Anxiety Level: Calm Cooperation: Cooperative Education Importance: Acknowledges Need Interest in Health Problems: Asks Questions Perception: Coherent Willingness to Engage in Self-Management High Activities: Readiness to Engage in Self-Management High Activities: Electronic Signature(s) Signed: 12/12/2022 5:56:04 PM By: Baruch Gouty RN, BSN Entered By: Baruch Gouty on 12/12/2022 09:41:04 -------------------------------------------------------------------------------- Fall Risk Assessment Details Patient Name: Date of  Service: Carl Conley IN S. 12/12/2022 9:00 A M Medical Record Number: [id number] Patient Account Number: [id number] Date of Birth/Sex: Treating RN: Feb 18, 1986 (37 y.o. Ernestene Mention Primary Care Namish Krise: Riesa Pope Other Clinician: Referring Vedha Tercero: Treating Isobella Ascher/Extender: Graylin Shiver, Vasilios Weeks in Treatment: 0 Fall Risk Assessment Items Have you had 2 or more falls in the last [address] Carl Conley, Carl Conley ([id number]) [phone] [id number] Page 3 of 4 Have you had any fall that resulted in injury in the last 12 monthso 0 No FALLS RISK SCREEN History of falling - immediate or within 3 months 25 Yes Secondary diagnosis (Do you have 2 or more medical  diagnoseso) 0 No Ambulatory aid None/bed rest/wheelchair/nurse 0 No Crutches/cane/walker 15 Yes Furniture 0 No Intravenous therapy Access/Saline/Heparin Lock 0 No Gait/Transferring Normal/ bed rest/ wheelchair 0 Yes Weak (short steps with or without shuffle, stooped but able to lift head while walking, may seek 0 No support from furniture) Impaired (short steps with shuffle, may have difficulty arising from chair, head down, impaired 0 No balance) Mental Status Oriented to own ability 0 Yes Electronic Signature(s) Signed: 12/12/2022 5:56:04 PM By: Baruch Gouty RN, BSN Entered By: Baruch Gouty on 12/12/2022 09:41:42 -------------------------------------------------------------------------------- Foot Assessment Details Patient Name: Date of Service: Carl Conley IN S. 12/12/2022 9:00 A M Medical Record Number: [id number] Patient Account Number: [id number] Date of Birth/Sex: Treating RN: 01-03-1986 (37 y.o. Ernestene Mention Primary Care Cleopatra Sardo: Riesa Pope Other Clinician: Referring Jevaeh Shams: Treating Reedy Biernat/Extender: Graylin Shiver, Vasilios Weeks in Treatment: 0 Foot Assessment Items Site Locations + = Sensation present, - =  Sensation absent, C = Callus, U = Ulcer R = Redness, W = Warmth, M = Maceration, PU = Pre-ulcerative lesion F = Fissure, S = Swelling, D = Dryness Assessment Right: Left: Other Deformity: No No Prior Foot Ulcer: Yes Yes Prior Amputation: Yes Yes Charcot Joint: No No Ambulatory Status: Ambulatory With Help Assistance Device: MOXON, Carl Conley ([id number]) [phone] [id number] Page 4 of 4 Gait: Steady Electronic Signature(s) Signed: 12/12/2022 5:56:04 PM By: Baruch Gouty RN, BSN Entered By: Baruch Gouty on 12/12/2022 09:44:22 -------------------------------------------------------------------------------- Nutrition Risk Screening Details Patient Name: Date of Service: Carl Conley, Carl Conley IN S. 12/12/2022 9:00 A M Medical Record Number: [id number] Patient Account Number: [id number] Date of Birth/Sex: Treating RN: Aug 08, 1986 (37 y.o. Ernestene Mention Primary Care Adelin Ventrella: Riesa Pope Other Clinician: Referring Sherie Dobrowolski: Treating Ahkeem Goede/Extender: Graylin Shiver, Vasilios Weeks in Treatment: 0 Height (in): 71 Weight (lbs): 230 Body Mass Index (BMI): 32.1 Nutrition Risk Screening Items Score Screening NUTRITION RISK SCREEN: I have an illness or condition that made me change the kind and/or amount of food I eat 0 No I eat fewer than two meals per day 0 No I eat few fruits and vegetables, or milk products 0 No I have three or more drinks of beer, liquor or wine almost every day 0 No I have tooth or mouth problems that make it hard for me to eat 0 No I don't always have enough money to buy the food I need 0 No I eat alone most of the time 0 No I take three or more different prescribed or over-the-counter drugs a day 1 Yes Without wanting to, I have lost or gained 10 pounds in the last six months 0 No I am not always physically able to shop, cook and/or feed myself 0 No Nutrition Protocols Good Risk Protocol 0 No interventions  needed Moderate Risk Protocol High Risk Proctocol Risk Level: Good Risk Score: 1 Electronic Signature(s) Signed: 12/12/2022 5:56:04 PM By: Baruch Gouty RN, BSN Entered By: Baruch Gouty on 12/12/2022 09:42:13
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
## 2022-12-14 NOTE — Progress Notes (Signed)
Internal Medicine Clinic Attending  Case discussed with Dr. Katsadouros  At the time of the visit.  We reviewed the resident's history and exam and pertinent patient test results.  I agree with the assessment, diagnosis, and plan of care documented in the resident's note.  

## 2022-12-18 ENCOUNTER — Telehealth: Payer: Self-pay | Admitting: Podiatry

## 2022-12-18 ENCOUNTER — Other Ambulatory Visit: Payer: Self-pay

## 2022-12-18 ENCOUNTER — Telehealth (INDEPENDENT_AMBULATORY_CARE_PROVIDER_SITE_OTHER): Payer: Medicaid Other | Admitting: Internal Medicine

## 2022-12-18 ENCOUNTER — Telehealth: Payer: Self-pay

## 2022-12-18 DIAGNOSIS — E1169 Type 2 diabetes mellitus with other specified complication: Secondary | ICD-10-CM | POA: Diagnosis not present

## 2022-12-18 DIAGNOSIS — Z7984 Long term (current) use of oral hypoglycemic drugs: Secondary | ICD-10-CM

## 2022-12-18 DIAGNOSIS — L089 Local infection of the skin and subcutaneous tissue, unspecified: Secondary | ICD-10-CM

## 2022-12-18 DIAGNOSIS — E11628 Type 2 diabetes mellitus with other skin complications: Secondary | ICD-10-CM

## 2022-12-18 DIAGNOSIS — M869 Osteomyelitis, unspecified: Secondary | ICD-10-CM | POA: Diagnosis not present

## 2022-12-18 NOTE — Progress Notes (Signed)
[facility] for Infectious Disease  Patient Active Problem List   Diagnosis Date Noted   Encounter for screening involving social determinants of health (SDoH) 12/13/2022   Pressure injury of skin 12/03/2022   Diabetic osteomyelitis ([place]) 11/01/2022   Healthcare maintenance 07/17/2021   Seborrheic dermatitis 11/11/2020   Hypercalcemia 11/11/2020   Tension headache 09/10/2019   NAFLD (nonalcoholic fatty liver disease) 04/01/2019   ADHD    Visual disturbance 10/31/2016   Depression 10/31/2016   Diabetes ([place]) 04/13/2016      Subjective:    Patient ID: Carl Conley, male    DOB: [birth year], 37 y.o.   MRN: [id number]  Chief Complaint  Patient presents with   Hospitalization Follow-up    Osteomyelitis - patient reports he is feeling okay.     HPI:  Carl Conley is a 37 y.o. male here for f/u diabetic foot infection   He grew mssa and psA and was placed on 6 weeks cefepime until 2/29 He has been seeing wound care after discharge Tolerating cefepime No concern with wound  No f/c Once a day loose stool  No issue with picc   Allergies  Allergen Reactions   Doxycycline Nausea And Vomiting      Outpatient Medications Prior to Visit  Medication Sig Dispense Refill   acyclovir (ZOVIRAX) 400 MG tablet Take 400 mg by mouth 2 (two) times daily.     Canagliflozin-metFORMIN HCl ER (INVOKAMET XR) [phone] MG TB24 Take 2 tablets by mouth daily. (Patient taking differently: Take 1 tablet by mouth 2 (two) times daily.) 90 tablet 3   ceFEPime (MAXIPIME) IVPB Inject 2 g into the vein every 8 (eight) hours. Indication:  DFI w/ MSSA and PsA First Dose: Yes Last Day of Therapy:  01/03/23 Labs - Once weekly:  CBC/D and BMP, Labs - Every other week:  ESR and CRP Method of administration: IV Push Remove PICC line after the completion of IV antibiotics Method of administration may be changed at the discretion of home infusion pharmacist based upon assessment of  the patient and/or caregiver's ability to self-administer the medication ordered. 123 Units 0   citalopram (CELEXA) 20 MG tablet TAKE 1 TABLET BY MOUTH EVERY DAY (Patient taking differently: Take 20 mg by mouth daily.) 90 tablet 2   rosuvastatin (CRESTOR) 20 MG tablet Take 1 tablet (20 mg total) by mouth daily. 30 tablet 1   VITAMIN D PO Take 1 tablet by mouth daily.     Bacitracin-Polymyxin B (NEOSPORIN EX) Apply 1 Application topically daily as needed (antibiotic ointment). (Patient not taking: Reported on 12/18/2022)     timolol (TIMOPTIC) 0.5 % ophthalmic solution Place 1 drop into the right eye every morning. (Patient not taking: Reported on 12/18/2022)     No facility-administered medications prior to visit.     Social History   Socioeconomic History   Marital status: Single    Spouse name: Not on file   Number of children: 0   Years of education: BA   Highest education level: Bachelor's degree (e.g., BA, AB, BS)  Occupational History   Occupation: N/A  Tobacco Use   Smoking status: Never   Smokeless tobacco: Never  Vaping Use   Vaping Use: Never used  Substance and Sexual Activity   Alcohol use: No   Drug use: No   Sexual activity: Not on file  Other Topics Concern   Not on file  Social History Narrative   Denies caffeine use  Social Determinants of Health   Financial Resource Strain: Not on file  Food Insecurity: No Food Insecurity (11/26/2022)   Hunger Vital Sign    Worried About Running Out of Food in the Last Year: Never true    Ran Out of Food in the Last Year: Never true  Transportation Needs: No Transportation Needs (11/26/2022)   PRAPARE - Hydrologist (Medical): No    Lack of Transportation (Non-Medical): No  Physical Activity: Not on file  Stress: Not on file  Social Connections: Not on file  Intimate Partner Violence: Not At Risk (11/26/2022)   Humiliation, Afraid, Rape, and Kick questionnaire    Fear of Current or  Ex-Partner: No    Emotionally Abused: No    Physically Abused: No    Sexually Abused: No      Review of Systems     Objective:    There were no vitals taken for this visit. Nursing note and vital signs reviewed.  Physical Exam     Video visit No distress; conversant Picc site no erythema Wound in feet dressing c/d/I Normal respiratory effort  Labs: Reviewed opat labs  Micro:  Serology:  Imaging:  Assessment & Plan:   Problem List Items Addressed This Visit   None Visit Diagnoses     Diabetic foot infection ([place])    -  Primary   Osteomyelitis, unspecified site, unspecified type ()             No orders of the defined types were placed in this encounter.  Diagnosis: Bilateral diabetic foot ulcer infection with om S/p left tma 1/18 Procedure: 1/15 Right foot: Excisional debridement of ulceration to level of bone, 2.5 x 2.5 x 0.4 cm, R ankle Bone biopsy distal fibula, right ankle Amputation of 2nd toe at metatarsal phalangeal joint level, Right foot   Left foot: Partial 2nd ray amputation, left foot 3rd metatarsal head bone biopsy, left foot 1st metatarsal head bone biopsy, left foot   Culture Result: PsA (S cefepime, cipro); mssa  Continue cefepime till 2/29 Picc can be removed then  F/u 1 month from now     Follow-up: Return in about 6 weeks (around 01/29/2023).    I verified that I was speaking with the correct person using two identifiers. Due to the COVID-19 Pandemic, this service was provided via telemedicine using audio/visual media.   The patient was located at home. The provider was located in the office. This is video visit The patient did consent to this visit and is aware of charges through their insurance as well as the limitations of evaluation and management by telemedicine. Other persons participating in this telemedicine service were none. Time spent on visit was greater than 20 minutes on media and in coordination of  care    Jabier Mutton, [place] [facility] 12/18/2022, 11:15 AM

## 2022-12-18 NOTE — Telephone Encounter (Signed)
Per Dr.Vu - pull PICC line after last doe of IV cefepime on 01/03/2023. Message sent to Carolynn Sayers, RN and [facility] staff. [facility] pharmacists notified as well.    [place], CMA

## 2022-12-18 NOTE — Telephone Encounter (Signed)
Pt states that the wound care center at [facility] needs permission from Dr Loel Lofty to look at his foot. He has appt tomorrow 12/19/22 at 2pm at the wound care center.  Please advise

## 2022-12-19 ENCOUNTER — Ambulatory Visit (INDEPENDENT_AMBULATORY_CARE_PROVIDER_SITE_OTHER): Payer: Self-pay | Admitting: Licensed Clinical Social Worker

## 2022-12-19 ENCOUNTER — Ambulatory Visit (HOSPITAL_BASED_OUTPATIENT_CLINIC_OR_DEPARTMENT_OTHER): Payer: Self-pay | Admitting: General Surgery

## 2022-12-19 ENCOUNTER — Encounter (HOSPITAL_BASED_OUTPATIENT_CLINIC_OR_DEPARTMENT_OTHER): Payer: Medicaid Other | Admitting: General Surgery

## 2022-12-19 DIAGNOSIS — F32A Depression, unspecified: Secondary | ICD-10-CM

## 2022-12-19 DIAGNOSIS — E11621 Type 2 diabetes mellitus with foot ulcer: Secondary | ICD-10-CM | POA: Diagnosis not present

## 2022-12-19 NOTE — BH Specialist Note (Signed)
Patient request [facility] to call him back on 02/15 at 1pm. Patient was not in a confidential space. Patient was also driving.   Milus Height, MSW, [place]  [facility] Direct Dial:[phone]  Fax [phone] Main Office Phone: [phone] [address] [place], [place] [address] Website: [place], [place]

## 2022-12-20 ENCOUNTER — Ambulatory Visit (INDEPENDENT_AMBULATORY_CARE_PROVIDER_SITE_OTHER): Payer: Self-pay

## 2022-12-20 ENCOUNTER — Ambulatory Visit (INDEPENDENT_AMBULATORY_CARE_PROVIDER_SITE_OTHER): Payer: Medicaid Other | Admitting: Licensed Clinical Social Worker

## 2022-12-20 ENCOUNTER — Ambulatory Visit (INDEPENDENT_AMBULATORY_CARE_PROVIDER_SITE_OTHER): Payer: Self-pay | Admitting: Podiatry

## 2022-12-20 ENCOUNTER — Other Ambulatory Visit (HOSPITAL_COMMUNITY): Payer: Self-pay

## 2022-12-20 ENCOUNTER — Telehealth: Payer: Self-pay

## 2022-12-20 DIAGNOSIS — E1142 Type 2 diabetes mellitus with diabetic polyneuropathy: Secondary | ICD-10-CM

## 2022-12-20 DIAGNOSIS — F32A Depression, unspecified: Secondary | ICD-10-CM

## 2022-12-20 DIAGNOSIS — E1169 Type 2 diabetes mellitus with other specified complication: Secondary | ICD-10-CM

## 2022-12-20 DIAGNOSIS — M869 Osteomyelitis, unspecified: Secondary | ICD-10-CM

## 2022-12-20 DIAGNOSIS — Z9889 Other specified postprocedural states: Secondary | ICD-10-CM

## 2022-12-20 NOTE — Progress Notes (Signed)
CEASER, GARAY ([id number]) [id number] Page 1 of 8 Visit Report for 12/19/2022 Chief Complaint Document Details Patient Name: Date of Service: Carl Conley IN S. 12/19/2022 2:00 PM Medical Record Number: [id number] Patient Account Number: [id number] Date of Birth/Sex: Treating RN: [birth year] (37 y.o. M) Primary Care Provider: Riesa Pope Other Clinician: Referring Provider: Treating Provider/Extender: Graylin Shiver, Vasilios Weeks in Treatment: 1 Information Obtained from: Patient Chief Complaint Patients presents for treatment of a open diabetic ulcers after bilateral partial amputations (feet) Electronic Signature(s) Signed: 12/19/2022 3:28:48 PM By: Fredirick Maudlin MD FACS Entered By: Fredirick Maudlin on 12/19/2022 15:28:48 -------------------------------------------------------------------------------- Debridement Details Patient Name: Date of Service: Carl Conley IN S. 12/19/2022 2:00 PM Medical Record Number: [id number] Patient Account Number: [id number] Date of Birth/Sex: Treating RN: November 13, 1985 (37 y.o. Ernestene Mention Primary Care Provider: Riesa Pope Other Clinician: Referring Provider: Treating Provider/Extender: Graylin Shiver, Vasilios Weeks in Treatment: 1 Debridement Performed for Assessment: Wound #1 Right,Lateral Malleolus Performed By: Physician Fredirick Maudlin, MD Debridement Type: Debridement Severity of Tissue Pre Debridement: Fat layer exposed Level of Consciousness (Pre-procedure): Awake and Alert Pre-procedure Verification/Time Out Yes - 14:50 Taken: Start Time: 14:50 T Area Debrided (L x W): otal 2.8 (cm) x 2.5 (cm) = 7 (cm) Tissue and other material debrided: Non-Viable, Slough, Slough Level: Non-Viable Tissue Debridement Description: Selective/Open Wound Instrument: Curette Bleeding: None Procedural Pain: 0 Post Procedural Pain: 0 Response to Treatment:  Procedure was tolerated well Level of Consciousness (Post- Awake and Alert procedure): Post Debridement Measurements of Total Wound Length: (cm) 2.8 Width: (cm) 2.5 Depth: (cm) 0.1 Volume: (cm) 0.55 Character of Wound/Ulcer Post Debridement: Improved Severity of Tissue Post Debridement: Fat layer exposed Post Procedure Diagnosis Same as Carl Conley, Carl Conley ([id number]) [id number] Page 2 of 8 Notes scribed for Dr. Celine Ahr by Baruch Gouty Electronic Signature(s) Signed: 12/19/2022 3:59:07 PM By: Fredirick Maudlin MD FACS Signed: 12/19/2022 4:48:58 PM By: Baruch Gouty RN, BSN Entered By: Baruch Gouty on 12/19/2022 14:52:02 -------------------------------------------------------------------------------- HPI Details Patient Name: Date of Service: Carl Conley IN S. 12/19/2022 2:00 PM Medical Record Number: [id number] Patient Account Number: [id number] Date of Birth/Sex: Treating RN: September 24, 1986 (37 y.o. M) Primary Care Provider: Riesa Pope Other Clinician: Referring Provider: Treating Provider/Extender: Graylin Shiver, Vasilios Weeks in Treatment: 1 History of Present Illness HPI Description: ADMISSION 12/12/2022 This is a 37 year old type II diabetic (last hemoglobin A1c 6.5% on November 01, 2022). He was admitted to the hospital on November 17, 2022 with bilateral foot wounds. They had apparently been present for quite some time but worsened and plain films done as an outpatient showed osteomyelitis. Because of this finding, he was referred to the emergency department for admission and further evaluation and management. He subsequently underwent left transmetatarsal amputation, bone debridement of the right ankle and biopsy of the distal fibula and amputation of the right second toe. 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 While in the hospital, he received IV antibiotics and was discharged with another 4 weeks of IV cefepime via PICC line. Pathology from his operations demonstrated necrotizing inflammation and acute osteomyelitis. He has not yet had follow-up with podiatry; this is scheduled for tomorrow. His amputation sites are well-approximated with sutures and staples. The ankle wound has Integra in place; the silicone backing is peeling away. There is no concern for infection. 12/19/2022: Apparently the patient did not have a ride to  his visit with Dr. Loel Lofty last week and canceled the appointment. Rather than reschedule, he elected to come to his visit with [place]. The Integra silicone layer on the right lateral ankle wound is buckling and starting to fall off of its own accord. He still has staples present on the lateral ankle, barely holding the silicone in place. Sutures and staples are intact in his other wounds. All wounds appear to be well- approximated and there is no evidence of infection. Electronic Signature(s) Signed: 12/19/2022 3:31:13 PM By: Fredirick Maudlin MD FACS Previous Signature: 12/19/2022 3:30:19 PM Version By: Fredirick Maudlin MD FACS Entered By: Fredirick Maudlin on 12/19/2022 15:31:13 -------------------------------------------------------------------------------- Physical Exam Details Patient Name: Date of Service: Carl Conley IN S. 12/19/2022 2:00 PM Medical Record Number: [id number] Patient Account Number: [id number] Date of Birth/Sex: Treating RN: August 27, 1986 (37 y.o. M) Primary Care Provider: Riesa Pope Other Clinician: Referring Provider: Treating Provider/Extender: Graylin Shiver, Vasilios Weeks in Treatment: 1 Constitutional Hypotensive, but normal for this patient. . . . no acute distress. Respiratory Normal work of breathing on room air. Notes Carl Conley, Carl Conley ([id number])  [id number] Page 3 of 8 12/19/2022: The Integra silicone layer on the right lateral ankle wound is buckling and starting to fall off of its own accord. He still has staples present on the lateral ankle, barely holding the silicone in place. Sutures and staples are intact in his other wounds. All wounds appear to be well-approximated and there is no evidence of infection. Electronic Signature(s) Signed: 12/19/2022 3:32:04 PM By: Fredirick Maudlin MD FACS Entered By: Fredirick Maudlin on 12/19/2022 15:32:03 -------------------------------------------------------------------------------- Physician Orders Details Patient Name: Date of Service: Carl Conley IN S. 12/19/2022 2:00 PM Medical Record Number: [id number] Patient Account Number: [id number] Date of Birth/Sex: Treating RN: 1986/06/05 (37 y.o. 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 Loel Lofty if needed Return A Other: - Pt to call Dr. Loel Lofty to schedule a post op visit ASAP Bathing/ Shower/ Hygiene May shower with protection but do not get wound dressing(s) wet. Protect dressing(s) with water repellant cover (for example, large plastic bag) or a cast cover and may then take shower. - per Dr. Loel Lofty Off-Loading Other: - weight bearing to feet per podiatry Wound Treatment Wound #1 - Malleolus Wound Laterality: Right, Lateral Prim Dressing: Xeroform Occlusive Gauze Dressing, 4x4 in Other:to be changed by podiatry ary Discharge Instructions: Apply to wound  bed as instructed Secondary Dressing: Woven Gauze Sponge, Non-Sterile 4x4 in Other:to be changed by podiatry Discharge Instructions: Apply over primary dressing as directed. Secured With: [employer], 4.5x3.1 (in/yd) Other:to be changed by podiatry Discharge Instructions: Secure with Kerlix as directed. Electronic Signature(s) Signed: 12/19/2022 3:59:07 PM By: Fredirick Maudlin MD FACS Entered By: Fredirick Maudlin on 12/19/2022 15:32:18 -------------------------------------------------------------------------------- Problem List Details Patient Name: Date of Service: Carl Conley IN S. 12/19/2022 2:00 PM Medical Record Number: [id number] Patient Account Number: [id number] Carl Conley, Carl Conley ([id number]) [phone].pdf Page 4 of 8 Date of Birth/Sex: Treating RN: 21-Oct-1986 (37 y.o. 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 12/19/2022 2:00 PM Medical Record Number: [id number] Patient Account Number: [id number] Date of Birth/Sex: Treating RN: Mar 20, 1986 (37 y.o. M) Primary Care Provider: Riesa Pope Other Clinician: Referring Provider: Treating Provider/Extender: Graylin Shiver, Vasilios Weeks in Treatment: 1 Subjective Chief Complaint Information obtained from Patient Patients presents for treatment of a open diabetic ulcers after bilateral partial amputations (feet) History of Present Illness (HPI) ADMISSION 12/12/2022 This is a 37 year old type II diabetic (last hemoglobin A1c 6.5% on November 01, 2022). He was admitted to the hospital on November 17, 2022 with bilateral foot wounds. They had apparently been present for quite some time but worsened and plain films done as an outpatient showed osteomyelitis. Because of this finding, he was referred to the emergency department for admission and further evaluation and management. He subsequently underwent left transmetatarsal amputation, bone debridement of the right ankle and biopsy of the distal fibula and amputation of the right second toe. 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 While in the hospital, he received IV antibiotics and was discharged with another 4 weeks of IV cefepime via PICC line. Pathology from his operations demonstrated necrotizing inflammation and acute osteomyelitis. He has not yet had follow-up with podiatry; this is scheduled for tomorrow. His amputation sites are well-approximated with sutures and staples. The ankle wound has Integra in place; the silicone backing is peeling away. There is no concern for infection. Carl Conley, Carl Conley ([id number]) [id number] Page 5 of 8 12/19/2022: Apparently the patient did not have a ride to his visit with Dr. Loel Lofty last week and canceled the appointment. Rather than reschedule, he elected to come to his visit with [place]. The Integra silicone layer on the right lateral  ankle wound is buckling and starting to fall off of its own accord. He still has staples present on the lateral ankle, barely holding the silicone in place. Sutures and staples are intact in his other wounds. All wounds appear to be well- approximated and there is no evidence of infection. Patient History Information obtained from Patient. Family History No family history of Cancer, Diabetes, Heart Disease, Hereditary Spherocytosis, Hypertension, Kidney Disease, Lung Disease, Seizures, Stroke, Thyroid Problems, Tuberculosis. Social History Never smoker, Marital Status - Single, Alcohol Use - Never, Drug Use - No History, Caffeine Use - Rarely - diet soda. Medical History Eyes Patient has history of Cataracts - bil extracted Denies history of Glaucoma, Optic Neuritis Endocrine Patient has history of Type II Diabetes Denies history of Type I Diabetes Genitourinary Denies history of End Stage Renal Disease Integumentary (Skin) Denies history of History of Burn Musculoskeletal Patient has history of Osteomyelitis - bil feet Neurologic Patient has history of Neuropathy Oncologic Denies history of Received Chemotherapy, Received Radiation Psychiatric Denies history of Anorexia/bulimia, Confinement Anxiety Hospitalization/Surgery History - abdonimal a-gram. - left transmet amputation. - right 2nd toe amputation. - wisdom tooth extraction. Objective Constitutional Hypotensive, but normal  for this patient. no acute distress. Vitals Time Taken: 2:12 AM, Height: 71 in, Weight: 230 lbs, BMI: 32.1, Temperature: 98.6 F, Pulse: 84 bpm, Respiratory Rate: 20 breaths/min, Blood Pressure: 89/59 mmHg. Respiratory Normal work of breathing on room air. General Notes: 12/19/2022: The Integra silicone layer on the right lateral ankle wound is buckling and starting to fall off of its own accord. He still has staples present on the lateral ankle, barely holding the silicone in place. Sutures and staples  are intact in his other wounds. All wounds appear to be well-approximated and there is no evidence of infection. Integumentary (Hair, Skin) Wound #1 status is Open. Original cause of wound was Gradually Appeared. The date acquired was: 05/05/2022. The wound has been in treatment 1 weeks. The wound is located on the Right,Lateral Malleolus. The wound measures 2.8cm length x 2.5cm width x 0.1cm depth; 5.498cm^2 area and 0.55cm^3 volume. There is Fat Layer (Subcutaneous Tissue) exposed. There is no tunneling or undermining noted. There is a small amount of serous drainage noted. The wound margin is flat and intact. There is small (1-33%) pink granulation within the wound bed. There is no necrotic tissue within the wound bed. The periwound skin appearance had no abnormalities noted for texture. The periwound skin appearance had no abnormalities noted for moisture. The periwound skin appearance had no abnormalities noted for color. Periwound temperature was noted as No Abnormality. 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 There was a Selective/Open Wound Non-Viable Tissue Debridement with a total area of 7 sq cm performed by Fredirick Maudlin, MD. With the following instrument(s): Curette to remove Non-Viable tissue/material. Material removed includes [facility]. No specimens were taken. A time out was conducted at 14:50, prior to the start of the procedure. There  was no bleeding. The procedure was tolerated well with a pain level of 0 throughout and a pain level of 0 following the procedure. Post Debridement Measurements: 2.8cm length x 2.5cm width x 0.1cm depth; 0.55cm^3 volume. Character of Wound/Ulcer Post Debridement is improved. Severity of Tissue Post Debridement is: Fat layer exposed. Post procedure Diagnosis Wound #1: Same as Pre-Procedure General Notes: scribed for Dr. Celine Ahr by Baruch Gouty. Plan Follow-up Appointments: Return Appointment in: - after seen by Dr. Loel Lofty if needed Other: - Pt to call Dr. Loel Lofty to schedule a post op visit ASAP Bathing/ Shower/ Hygiene: May shower with protection but do not get wound dressing(s) wet. Protect dressing(s) with water repellant cover (for example, large plastic bag) or a cast cover and may then take shower. - per Dr. Loel Lofty Off-Loading: Other: - weight bearing to feet per podiatry WOUND #1: - Malleolus Wound Laterality: Right, Lateral Prim Dressing: Xeroform Occlusive Gauze Dressing, 4x4 in Other:to be changed by podiatry/ ary Discharge Instructions: Apply to wound bed as instructed Secondary Dressing: Woven Gauze Sponge, Non-Sterile 4x4 in Other:to be changed by podiatry/ Discharge Instructions: Apply over primary dressing as directed. Secured With: [employer], 4.5x3.1 (in/yd) Other:to be changed by podiatry/ Discharge Instructions: Secure with Kerlix as directed. 12/19/2022: The Integra silicone layer on  the right lateral ankle wound is buckling and starting to fall off of its own accord. He still has staples present on the lateral ankle, barely holding the silicone in place. Sutures and staples are intact in his other wounds. All wounds appear to be well-approximated and there is no evidence of infection. I removed the staples and the hanging silicone layer of his Integra. I used a curette to debride some light slough and noted that he had very good take of the Integra  overall. I told the patient that he really needed to see Dr. Loel Lofty for his routine postoperative care. If there was a desire on Dr. Marcelyn Ditty part for [place] to assume care for any of his wounds after he had his postoperative follow-up, we were happy to do so. I also sent a staff message via epic to Dr. Loel Lofty, letting him know that I had remove the silicone and staples from the lateral ankle wound. We will apply Xeroform and a silicone border dressing to the ankle to maintain the moisture balance of the wound. We have not scheduled a follow-up visit in our clinic as the patient must follow-up with his surgeon before being seen here again. Electronic Signature(s) Signed: 12/19/2022 3:34:07 PM By: Fredirick Maudlin MD FACS Entered By: Fredirick Maudlin on 12/19/2022 15:34:06 -------------------------------------------------------------------------------- HxROS Details Patient Name: Date of Service: Carl Conley IN S. 12/19/2022 2:00 PM Medical Record Number: [id number] Patient Account Number: [id number] Date of Birth/Sex: Treating RN: 11/21/1985 (37 y.o. M) Primary Care Provider: Riesa Pope Other Clinician: Referring Provider: Treating Provider/Extender: Graylin Shiver, Vasilios Weeks in Treatment: 1 Information Obtained From Patient Eyes Medical History: Positive for: Cataracts - bil extracted Negative for: Glaucoma; Optic Neuritis Carl Conley, Carl Conley ([id number]) [id number] Page 7 of 8 Endocrine Medical History: Positive for: Type II Diabetes Negative for: Type I Diabetes Time with diabetes: since 2005 Treated with: Oral agents Blood sugar tested every day: No Genitourinary Medical History: Negative for: End Stage Renal Disease Integumentary (Skin) Medical History: Negative for: History of Burn Musculoskeletal Medical History: Positive for: Osteomyelitis - bil feet Neurologic Medical History: Positive for:  Neuropathy Oncologic Medical History: Negative for: Received Chemotherapy; Received Radiation Psychiatric Medical History: Negative for: Anorexia/bulimia; Confinement Anxiety HBO Extended History Items Eyes: Cataracts Immunizations Pneumococcal Vaccine: Received Pneumococcal Vaccination: Yes Received Pneumococcal Vaccination On or After 60th Birthday: No Implantable Devices No devices added Hospitalization / Surgery History Type of Hospitalization/Surgery abdonimal a-gram left transmet amputation right 2nd toe amputation wisdom tooth extraction Family and Social History Cancer: No; Diabetes: No; Heart Disease: No; Hereditary Spherocytosis: No; Hypertension: No; Kidney Disease: No; Lung Disease: No; Seizures: No; Stroke: No; Thyroid Problems: No; Tuberculosis: No; Never smoker; Marital Status - Single; Alcohol Use: Never; Drug Use: No History; Caffeine Use: Rarely - diet soda; Financial Concerns: No; Food, Clothing or Shelter Needs: No; Support System Lacking: No; Transportation Concerns: Yes - does not have a Personal assistant) Signed: 12/19/2022 3:59:07 PM By: Fredirick Maudlin MD FACS Entered By: Fredirick Maudlin on 12/19/2022 15:31:26 Carl Conley ([id number]) [id number] Page 8 of 8 -------------------------------------------------------------------------------- SuperBill Details Patient Name: Date of Service: Carl Conley, Carl Conley IN S. 12/19/2022 Medical Record Number: [id number] Patient Account Number: [id number] Date of Birth/Sex: Treating RN: Aug 23, 1986 (37 y.o. 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 Z89.421 Acquired absence  of other  right toe(s) L97.312 Non-pressure chronic ulcer of right ankle with fat layer exposed Facility Procedures : CPT4 Code: [id number] Description: [id number] - DEBRIDE WOUND 1ST 20 SQ CM OR < ICD-10 Diagnosis Description L97.312 Non-pressure chronic ulcer of right ankle with fat layer exposed M86.9 Osteomyelitis, unspecified Modifier: Quantity: 1 Physician Procedures : CPT4 Code Description Modifier [id number] - WC PHYS LEVEL 3 - EST PT 25 ICD-10 Diagnosis Description L97.312 Non-pressure chronic ulcer of right ankle with fat layer exposed E11.621 Type 2 diabetes mellitus with foot ulcer M86.9 Osteomyelitis,  unspecified Z89.421 Acquired absence of other right toe(s) Quantity: 1 : [id number] - WC PHYS DEBR WO ANESTH 20 SQ CM ICD-10 Diagnosis Description L97.312 Non-pressure chronic ulcer of right ankle with fat layer exposed M86.9 Osteomyelitis, unspecified Quantity: 1 Electronic Signature(s) Signed: 12/19/2022 3:34:31 PM By: Fredirick Maudlin MD FACS Entered By: Fredirick Maudlin on 12/19/2022 15:34:30

## 2022-12-20 NOTE — BH Specialist Note (Signed)
[facility] contacted patient on today. Patient denied needing therapy at this time. Patient agreed to contact PCP in future if therapy is needed. Patient stated medication- Celexa is working well for him. [facility] will make no further outreaches to patient. Patient has [facility] contact information.   Milus Height, MSW, [place] Direct Dial:[phone]  Fax [phone] Main Office Phone: [phone] [place], [place] [address] Website: [place], [place]

## 2022-12-20 NOTE — Telephone Encounter (Signed)
Pt neighbor ( MRS WEBB)  called  trying to get info about pt and  get a blanket release of info form  sent to her I explained twice to her that we can't speak to her about pt and we can't send out blank forms .Marland Kitchen She also  spoke to [place]  who told her the same thing she then asked to speak to some one else  the [place] spoke to her she still did not receive the message  of we can't and will not break HIPAA laws  she then asked to speak to some one over out [place]  but when being transferred she hanged up the phone and call back requesting info again on another patient

## 2022-12-20 NOTE — Progress Notes (Signed)
Subjective:  Patient ID: Carl Conley, male    DOB: 05/28/1986,  MRN: [id number]  Chief Complaint  Patient presents with   Follow-up    Patient states that he saw wound care yesterday. No drainage,swelling, or redness.     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  37 y.o. male returns for first post-op check status post the above procedures that were completed on November 19, 2022.  He is seeing wound care doctor yesterday who remove the Integra layer on the right lateral ankle.  He denies any issues since surgery he is now walking in regular shoes.  Sutures and staples are still intact.  He has been keeping the right and left foot dry since surgery.  Denies any concerns.  Denies any pain.  He is to remain on cefepime until February 29 and then the PICC will be removed.  Review of Systems: Negative except as noted in the HPI. Denies N/V/F/Ch.   Objective:  There were no vitals filed for this visit. There is no height or weight on file to calculate BMI. Constitutional Well developed. Well nourished.  Vascular Foot warm and well perfused. Capillary refill normal to all digits.  Calf is soft and supple, no posterior calf or knee pain, negative Homans' sign  Neurologic Normal speech. Oriented to person, place, and time. Epicritic sensation to light touch grossly present bilaterally.  Dermatologic Skin healing well without signs of infection. Skin edges well coapted without signs of infection.  Also present to the right lateral ankle with healthy granular tissue somewhat desiccated though overall healthy with no significant erythema and no drainage.  Sutures and staples intact to the bilateral lower extremity no evidence of dehiscence drainage mild eschar but no significant area of open skin at the  amputation sites.  Healing well.       Orthopedic: Tenderness to palpation noted about the surgical site.   Multiple view plain film radiographs: XR right ankle 3 views AP lateral oblique demonstrate no significant osteolysis or erosions of the distal fibula no soft tissue emphysema or other signs of infection. XR 3 views of the left foot demonstrate transmetatarsal amputation healing well with no significant erosions aside from the second metatarsal which does have questionable osteolysis distally however difficult to determine given amputation at that area. Assessment:   1. Postoperative state   2. Diabetic osteomyelitis ([place])   3. DM type 2 with diabetic peripheral neuropathy ([place])    Plan:  Patient was evaluated and treated and all questions answered.  S/p foot surgery bilaterally, right foot lateral ankle ulceration debridement and bone biopsy as well as second toe amputation MPJ level.  Left foot transmetatarsal amputation and tendo Achilles lengthening -Progressing as expected post-operatively.  All surgical sites improved with good healing noted and no evidence of dehiscence or residual infection on the left foot TMA site or the right second MPJ site.  Right lateral ankle ulcer also improving after Integra application. -XR: As above no immediate sign of complication -WB Status: Weightbearing as tolerated in regular shoes -Sutures: Sutures and staples removed in total at this visit Steri-Strips were applied to reinforce. -Medications: Continue antibiotics per ID recommendations -Foot redressed with Ace wrap dressings bilaterally.  Patient is now okay to get the foot wet but should dry the area thoroughly after and continue to monitor for  any maceration breakdown drainage erythema or edema in either foot.  Return in about 4 weeks (around 01/17/2023) for Second postop visit L foot TMA.         Everitt Amber, DPM [facility]

## 2022-12-20 NOTE — Progress Notes (Signed)
Carl Conley ([id number]) [id number] Page 1 of 7 Visit Report for 12/19/2022 Arrival Information Details Patient Name: Date of Service: Carl Conley, Carl Conley IN S. 12/19/2022 2:00 PM Medical Record Number: [id number] Patient Account Number: [id number] Date of Birth/Sex: Treating RN: 1986-07-24 (37 y.o. M) Primary Care Carl Conley: Carl Conley Other Clinician: Referring Carl Conley: Treating Carl Conley: Carl Conley, Carl Conley in Treatment: 1 Visit Information History Since Last Visit All ordered tests and consults were completed: No Patient Arrived: Ambulatory Added or deleted any medications: No Arrival Time: 14:12 Any new allergies or adverse reactions: No Accompanied By: self Had a fall or experienced change in No Transfer Assistance: None activities of daily living that may affect Patient Identification Verified: Yes risk of falls: Secondary Verification Process Completed: Yes Signs or symptoms of abuse/neglect since last visito No Patient Requires Transmission-Based Precautions: No Hospitalized since last visit: No Patient Has Alerts: No Implantable device outside of the clinic excluding No cellular tissue based products placed in the center since last visit: Has Dressing in Place as Prescribed: Yes Pain Present Now: No Electronic Signature(s) Signed: 12/19/2022 4:48:58 PM By: Carl Gouty RN, BSN Entered By: Carl Conley on 12/19/2022 14:36:10 -------------------------------------------------------------------------------- Encounter Discharge Information Details Patient Name: Date of Service: Carl Conley IN S. 12/19/2022 2:00 PM Medical Record Number: [id number] Patient Account Number: [id number] Date of Birth/Sex: Treating RN: [birth year] (37 y.o. Carl Conley Primary Care Carl Conley: Carl Conley Other Clinician: Referring Franck Vinal: Treating Carl Conley: Carl Conley,  Carl Conley in Treatment: 1 Encounter Discharge Information Items Post Procedure Vitals Discharge Condition: Stable Temperature (F): 98.6 Ambulatory Status: Ambulatory Pulse (bpm): 84 Discharge Destination: Home Respiratory Rate (breaths/min): 18 Transportation: Private Auto Blood Pressure (mmHg): 89/59 Accompanied By: self Schedule Follow-up Appointment: Yes Clinical Summary of Care: Patient Declined Electronic Signature(s) Signed: 12/19/2022 4:48:58 PM By: Carl Gouty RN, BSN Entered By: Carl Conley on 12/19/2022 15:08:36 Carl Conley ([id number]) [id number] Page 2 of 7 -------------------------------------------------------------------------------- Lower Extremity Assessment Details Patient Name: Date of Service: [name], [name] IN S. 12/19/2022 2:00 PM Medical Record Number: [id number] Patient Account Number: [id number] Date of Birth/Sex: Treating RN: 01-19-1986 (37 y.o. Carl Conley Primary Care Carl Conley: Carl Conley Other Clinician: Referring Carl Conley: Treating Carl Conley: Carl Conley, Carl Conley in Treatment: 1 Edema Assessment Assessed: [Left: No] [Right: No] Edema: [Left: N] [Right: o] Vascular Assessment Pulses: Dorsalis Pedis Palpable: [Right:Yes] Electronic Signature(s) Signed: 12/19/2022 4:48:58 PM By: Carl Gouty RN, BSN Entered By: Carl Conley on 12/19/2022 14:36:45 -------------------------------------------------------------------------------- Multi Wound Chart Details Patient Name: Date of Service: Carl Conley IN S. 12/19/2022 2:00 PM Medical Record Number: [id number] Patient Account Number: [id number] Date of Birth/Sex: Treating RN: [birth year] (37 y.o. M) Primary Care Khalilah Hoke: Carl Conley Other Clinician: Referring Carl Conley: Treating Carl Conley: Carl Conley, Carl Conley in Treatment: 1 Vital Signs Height(in):  71 Pulse(bpm): 84 Weight(lbs): 230 Blood Pressure(mmHg): 89/59 Body Mass Index(BMI): 32.1 Temperature(F): 98.6 Respiratory Rate(breaths/min): 20 [1:Photos: No Photos Right, Lateral Malleolus Wound Location: Gradually Appeared Wounding Event: Diabetic Wound/Ulcer of the Lower Primary Etiology: Extremity Cataracts, Type II Diabetes, Comorbid History: Osteomyelitis, Neuropathy 05/05/2022 Date Acquired: 1 Conley  of Treatment: Open Wound Status: No Wound Recurrence: 2.8x2.5x0.1 Measurements L x W x D (cm) 5.498 A (cm) : rea 0.55 Volume (cm) : 0.00% % Reduction in A rea: 0.00% % Reduction in Volume: Grade 3 Classification: Small Exudate A mount: Serous Exudate  Type:] [N/A:N/A N/A N/A N/A N/A N/A N/A N/A N/A N/A N/A N/A N/A N/A N/A N/A N/A] Carl Conley, Carl Conley ([id number]) [1:amber Exudate  Color: Flat and Intact Wound Margin: Small (1-33%) Granulation Amount: Pink Granulation Quality: None Present (0%) Necrotic Amount: Fat Layer (Subcutaneous Tissue): Yes N/A Exposed Structures: Fascia: No Tendon: No Muscle: No Joint: No  Bone: No Small (1-33%) Epithelialization: Debridement - Selective/Open Wound N/A Debridement: Pre-procedure Verification/Time Out 14:50 Taken: [facility] Tissue Debrided: Non-Viable Tissue Level: 7 Debridement A (sq cm): rea Curette Instrument: None  Bleeding: 0 Procedural Pain: 0 Post Procedural Pain: Procedure was tolerated well Debridement Treatment Response: 2.8x2.5x0.1 Post Debridement Measurements L x W x D (cm) 0.55 Post Debridement Volume: (cm) No Abnormalities Noted Periwound Skin Texture:  No Abnormalities Noted Periwound Skin Moisture: No Abnormalities Noted Periwound Skin Color: No Abnormality Temperature: unable to visualize wound bed, integra Assessment Notes: still in place Debridement Procedures Performed:] [N/A:N/A N/A N/A N/A N/A  N/A N/A N/A N/A N/A N/A N/A N/A N/A N/A N/A N/A N/A N/A N/A N/A N/A N/A] Treatment Notes Wound #1 (Malleolus) Wound Laterality: Right,  Lateral Cleanser Peri-Wound Care Topical Primary Dressing Xeroform Occlusive Gauze Dressing, 4x4 in Discharge Instruction: Apply to wound bed as instructed Secondary Dressing Woven Gauze Sponge, Non-Sterile 4x4 in Discharge Instruction: Apply over primary dressing as directed. Secured With [employer], 4.5x3.1 (in/yd) Discharge Instruction: Secure with Kerlix as directed. Compression Wrap Compression Stockings Add-Ons Electronic Signature(s) Signed: 12/19/2022 3:28:32 PM By: Fredirick Maudlin MD FACS Entered By: Fredirick Maudlin on 12/19/2022 15:28:31 -------------------------------------------------------------------------------- Multi-Disciplinary Care Plan Details Patient Name: Date of Service: Carl Conley IN S. 12/19/2022 2:00 PM Medical Record Number: [id number] Patient Account Number: [id number] Date of Birth/Sex: Treating RN: 1986-03-15 (37 y.o. Jahn, Franchini, [place] Chauncey Cruel ([id number]) [id number] Page 4 of 7 Primary Care Darika Ildefonso: Carl Conley Other Clinician: Referring Tarisha Fader: Treating Jovi Zavadil/Extender: Carl Conley, Carl Conley in Treatment: 1 Multidisciplinary Care Plan reviewed with physician Active Inactive Abuse / Safety / Falls / Self Care Management Nursing Diagnoses: History of Falls Potential for falls Goals: Patient/caregiver will verbalize/demonstrate measures taken to prevent injury and/or falls Date Initiated: 12/12/2022 Target Resolution Date: 01/09/2023 Goal Status: Active Interventions: Assess fall risk on admission and as needed Assess impairment of mobility on admission and as needed per policy Notes: Nutrition Nursing Diagnoses: Impaired glucose control: actual or potential Goals: Patient/caregiver will maintain therapeutic glucose control Date Initiated: 12/12/2022 Target Resolution Date: 01/09/2023 Goal Status: Active Interventions: Assess HgA1c results as ordered upon  admission and as needed Assess patient nutrition upon admission and as needed per policy Provide education on elevated blood sugars and impact on wound healing Treatment Activities: Patient referred to Primary Care Physician for further nutritional evaluation : 12/12/2022 Notes: Wound/Skin Impairment Nursing Diagnoses: Impaired tissue integrity Knowledge deficit related to ulceration/compromised skin integrity Goals: Patient/caregiver will verbalize understanding of skin care regimen Date Initiated: 12/12/2022 Target Resolution Date: 01/09/2023 Goal Status: Active Ulcer/skin breakdown will have a volume reduction of 30% by week 4 Date Initiated: 12/12/2022 Target Resolution Date: 01/09/2023 Goal Status: Active Interventions: Assess patient/caregiver ability to obtain necessary supplies Assess patient/caregiver ability to perform ulcer/skin care regimen upon admission and as needed Assess ulceration(s) every visit Provide education on ulcer and skin care Treatment Activities: Skin care regimen initiated : 12/12/2022 Topical wound management initiated : 12/12/2022 Notes: Electronic Signature(s) Signed: 12/19/2022 4:48:58 PM By: Carl Gouty RN, BSN Entered By: Carl Conley on 12/19/2022 14:48:24 Carl Conley ([id number]) [id number] Page 5 of 7 -------------------------------------------------------------------------------- Pain Assessment Details Patient Name: Date of Service: Carl Conley, Carl Conley IN S. 12/19/2022 2:00 PM Medical Record Number: [id number] Patient Account Number: [id number] Date of  Birth/Sex: Treating RN: Feb 16, 1986 (37 y.o. M) Primary Care Glenford Garis: Carl Conley Other Clinician: Referring Edom Schmuhl: Treating Charletta Voight/Extender: Carl Conley, Carl Conley in Treatment: 1 Active Problems Location of Pain Severity and Description of Pain Patient Has Paino No Site Locations Pain Management and Medication Current Pain  Management: Electronic Signature(s) Signed: 12/19/2022 3:10:42 PM By: Worthy Rancher Entered By: Worthy Rancher on 12/19/2022 14:12:54 -------------------------------------------------------------------------------- Patient/Caregiver Education Details Patient Name: Date of Service: Carl Conley, Carl Conley IN S. 2/14/2024andnbsp2:00 PM Medical Record Number: [id number] Patient Account Number: [id number] Date of Birth/Gender: Treating RN: 06-28-1986 (37 y.o. Carl Conley Primary Care Physician: Carl Conley Other Clinician: Referring Physician: Treating Physician/Extender: Carl Conley, Carl Conley in Treatment: 1 Education Assessment Education Provided To: Patient Education Topics Provided Infection: Methods: Explain/Verbal Responses: Reinforcements needed, State content correctly Carl Conley, Carl Conley ([id number]) [id number] Page 6 of 7 Wound/Skin Impairment: Methods: Explain/Verbal Responses: Reinforcements needed, State content correctly Electronic Signature(s) Signed: 12/19/2022 4:48:58 PM By: Carl Gouty RN, BSN Entered By: Carl Conley on 12/19/2022 14:49:44 -------------------------------------------------------------------------------- Wound Assessment Details Patient Name: Date of Service: Carl Conley IN S. 12/19/2022 2:00 PM Medical Record Number: [id number] Patient Account Number: [id number] Date of Birth/Sex: Treating RN: July 10, 1986 (37 y.o. Carl Conley Primary Care Antonino Nienhuis: Carl Conley Other Clinician: Referring Newell Frater: Treating Janne Faulk/Extender: Carl Conley, Carl Conley in Treatment: 1 Wound Status Wound Number: 1 Primary Etiology: Diabetic Wound/Ulcer of the Lower Extremity Wound Location: Right, Lateral Malleolus Wound Status: Open Wounding Event: Gradually Appeared Comorbid History: Cataracts, Type II Diabetes, Osteomyelitis, Neuropathy Date Acquired: 05/05/2022 Conley Of  Treatment: 1 Clustered Wound: No Wound Measurements Length: (cm) 2.8 Width: (cm) 2.5 Depth: (cm) 0.1 Area: (cm) 5.498 Volume: (cm) 0.55 % Reduction in Area: 0% % Reduction in Volume: 0% Epithelialization: Small (1-33%) Tunneling: No Undermining: No Wound Description Classification: Grade 3 Wound Margin: Flat and Intact Exudate Amount: Small Exudate Type: Serous Exudate Color: amber Foul Odor After Cleansing: No Slough/Fibrino Yes Wound Bed Granulation Amount: Small (1-33%) Exposed Structure Granulation Quality: Pink Fascia Exposed: No Necrotic Amount: None Present (0%) Fat Layer (Subcutaneous Tissue) Exposed: Yes Tendon Exposed: No Muscle Exposed: No Joint Exposed: No Bone Exposed: No Periwound Skin Texture Texture Color No Abnormalities Noted: Yes No Abnormalities Noted: Yes Moisture Temperature / Pain No Abnormalities Noted: Yes Temperature: No Abnormality Assessment Notes unable to visualize wound bed, integra still in place Treatment Notes Wound #1 (Malleolus) Wound Laterality: Right, Lateral Cleanser Carl Conley, Carl Conley ([id number]) [id number] Page 7 of 7 Peri-Wound Care Topical Primary Dressing Xeroform Occlusive Gauze Dressing, 4x4 in Discharge Instruction: Apply to wound bed as instructed Secondary Dressing Woven Gauze Sponge, Non-Sterile 4x4 in Discharge Instruction: Apply over primary dressing as directed. Secured With [employer], 4.5x3.1 (in/yd) Discharge Instruction: Secure with Kerlix as directed. Compression Wrap Compression Stockings Add-Ons Electronic Signature(s) Signed: 12/19/2022 4:48:58 PM By: Carl Gouty RN, BSN Entered By: Carl Conley on 12/19/2022 14:40:13 -------------------------------------------------------------------------------- Vitals Details Patient Name: Date of Service: Carl Conley IN S. 12/19/2022 2:00 PM Medical Record Number: [id number] Patient Account Number:  [id number] Date of Birth/Sex: Treating RN: Aug 14, 1986 (37 y.o. M) Primary Care Audy Dauphine: Carl Conley Other Clinician: Referring Elizet Kaplan: Treating Amulya Quintin/Extender: Carl Conley, Carl Conley in Treatment: 1 Vital Signs Time Taken: 02:12 Temperature (F): 98.6 Height (in): 71 Pulse (bpm): 84 Weight (lbs): 230 Respiratory Rate (breaths/min): 20 Body Mass Index (BMI): 32.1 Blood Pressure (mmHg): 89/59 Reference Range: 80 - 120 mg / dl Electronic Signature(s) Signed: 12/19/2022 3:10:42 PM By: Worthy Rancher Entered By: Worthy Rancher on 12/19/2022  14:12:48 

## 2022-12-26 ENCOUNTER — Other Ambulatory Visit (HOSPITAL_COMMUNITY): Payer: Self-pay

## 2022-12-26 ENCOUNTER — Other Ambulatory Visit: Payer: Self-pay | Admitting: Internal Medicine

## 2022-12-27 ENCOUNTER — Other Ambulatory Visit: Payer: Self-pay

## 2022-12-27 ENCOUNTER — Other Ambulatory Visit (HOSPITAL_COMMUNITY): Payer: Self-pay

## 2022-12-27 NOTE — Telephone Encounter (Signed)
Pharmacy comment: REQUEST FOR 90 DAYS PRESCRIPTION.

## 2023-01-01 ENCOUNTER — Ambulatory Visit (HOSPITAL_BASED_OUTPATIENT_CLINIC_OR_DEPARTMENT_OTHER): Payer: Self-pay | Admitting: General Surgery

## 2023-01-01 ENCOUNTER — Telehealth: Payer: Self-pay

## 2023-01-01 NOTE — Telephone Encounter (Signed)
Received call from [facility] RN with concerns regarding patients lab previous labs    Latest Ref Rng & Units 11/27/2022    5:37 AM 11/26/2022    9:13 PM 11/24/2022    3:47 AM  BMP  Glucose 70 - 99 mg/dL 141   163   BUN 6 - 20 mg/dL 15   18   Creatinine 0.61 - 1.24 mg/dL 0.87  0.98  0.94   Sodium 135 - 145 mmol/L 135   135   Potassium 3.5 - 5.1 mmol/L 4.0   3.8   Chloride 98 - 111 mmol/L 107   104   CO2 22 - 32 mmol/L 21   21   Calcium 8.9 - 10.3 mg/dL 9.0   8.9     Most recent labs obtained on 12/31/22 Bun: 46 Creatinine: 4.36  RN reports patient feeling poorer, vomiting. Reports patient has missed a total of x4 doses of antibiotic therapy. RN encouraged patient to increase fluids as tolerated and to call EMS or go to the nearest if he continues to feel poor. Routing to provider for any additional advise.   Eugenia Mcalpine, LPN

## 2023-01-01 NOTE — Telephone Encounter (Addendum)
Lets tell him to stop abx and remove picc  See me again in around 4 week Thanks   -------- 01/04/23 addendum Last opat labs reviewed 12/31/22 Cbc 10.7/12.6/191; cr 4.4  Patient has been having nausea ?dehydrated   Will have him get labs done today next week

## 2023-01-02 ENCOUNTER — Telehealth: Payer: Self-pay | Admitting: *Deleted

## 2023-01-02 DIAGNOSIS — E1159 Type 2 diabetes mellitus with other circulatory complications: Secondary | ICD-10-CM

## 2023-01-02 NOTE — Telephone Encounter (Signed)
Called patient to update him on plan to stop antibiotics and remove PICC, no answer. Left HIPAA compliant voicemail requesting callback.   Orders to stop antibiotics and remove PICC line sent to Carolynn Sayers, RN with [employer].   Beryle Flock, RN

## 2023-01-02 NOTE — Telephone Encounter (Signed)
Call from [facility] with [facility] - wanting to know if we have any meters to give pt to check his BS's. She stated pt Medicaid is pending - he cannot afford to buy one at this time. I told her I will check with Butch Penny, diabetes coordinator, if she has any meters.

## 2023-01-03 ENCOUNTER — Other Ambulatory Visit (HOSPITAL_COMMUNITY): Payer: Self-pay

## 2023-01-03 ENCOUNTER — Encounter: Payer: Self-pay | Admitting: Student

## 2023-01-03 MED ORDER — BLOOD GLUCOSE MONITOR KIT
PACK | 0 refills | Status: DC
  Filled 2023-01-03: qty 1, fill #0
  Filled 2023-01-29: qty 1, 30d supply, fill #0

## 2023-01-03 NOTE — Telephone Encounter (Signed)
Call to [facility]- rec meter and strips at [facility] IM program vs generic. Transportation is a problem for him. Call to [facility] long and they will make an exception and deliver to him as well as honor the IM program  Carl Conley has additional concerns for triage today: Vomiting over weekend and again Monday and tuesday ems came out Tuesday evening- vitals okay- they recommended he stay hydrated, yesterday- still vomiting. She told him to go to emergency room, bowels moved before the weekend,missed wound center apptmt, not eating or drinking well Orders until iv antibiotic dced 1-Are we willing to reorder [facility] for a few weeks once a week abd [name] can continue charity care? 2-should he come into the office or go to ED? Told Beth I would speak to triage nurse.

## 2023-01-03 NOTE — Telephone Encounter (Signed)
Agree he needs to be evaluated and his his wounds seen. Thanks

## 2023-01-03 NOTE — Telephone Encounter (Signed)
RTC to [place] from [facility].  Patient has been vomiting since the weekend.  EMS was call came to home assess  and said that vitals were ok.  Patient again had vomiting Mon and Tuesday was told to go to the ER.  Missed Wound Care appointment due to transportation.  Is not drinking or eating well.  Patient stated that transportation is a problem.  [place] nurse said would like Verbal order to continue Wound Care for 1 time a week for 2 more weeks under the [facility] for the patient.  Call to Patient to inform him of an appointment this afternoon in the Clinics at 3:15 PM. Message was left on voice mail for patient to return call to confirm.  If unable to come to appointment patient was advised that he will need to call EMS and go to the ER.  Call to patient's contact # unable to leave a message.

## 2023-01-03 NOTE — Telephone Encounter (Signed)
Spoke with Lennette Bihari to update him on plan, he reports his home health nurse has already removed his PICC line.   Beryle Flock, RN

## 2023-01-03 NOTE — Telephone Encounter (Signed)
Left message for Beth with [name] that [facility] may be abel to deliver a meter and supplies under the IM program today or tomorrow.

## 2023-01-04 ENCOUNTER — Telehealth: Payer: Self-pay

## 2023-01-04 NOTE — Telephone Encounter (Signed)
Call fromBeth, RN [facility] requesting Nursing order to continue Wound Care and Disease management for 2 more weeks for patient.  Verbal ok was given.  Message to be sent to PCP.

## 2023-01-04 NOTE — Telephone Encounter (Signed)
Left voicemail asking patient to return my call.   Miri Jose P Zoeie Ritter, CMA  

## 2023-01-04 NOTE — Telephone Encounter (Signed)
Carl Conley with [facility] requesting VO for nursing. Please call back.

## 2023-01-05 ENCOUNTER — Other Ambulatory Visit (HOSPITAL_COMMUNITY): Payer: Self-pay

## 2023-01-07 ENCOUNTER — Telehealth: Payer: Self-pay | Admitting: *Deleted

## 2023-01-07 NOTE — Telephone Encounter (Signed)
Sending patient a letter to recheck labs as triage has not been able to reach by phone or mychart.

## 2023-01-07 NOTE — Telephone Encounter (Addendum)
Call from [place] with [facility]. Stated pt's BP is 91/64; no c/o light-headedness. Has a cough. Vomiting has resolved. Pt has slight abd pain - started 1 week ago. She stated pt needs an appt but no transportation; pt's mother has an appt on Monday. And only transportation would be to in come with his mother.  Pt nor mother drives. A neighbor,Patty , will provide transportation. At this time no available appts on Monday.  Dr Johnney Ou was made aware. He agreed to see pt and mother on Monday 3/11; he will overbook his schedule.  Talked to [facility] - stated she will call pt and Patty to let them know of appts.

## 2023-01-08 ENCOUNTER — Encounter: Payer: Self-pay | Admitting: *Deleted

## 2023-01-09 ENCOUNTER — Ambulatory Visit (HOSPITAL_BASED_OUTPATIENT_CLINIC_OR_DEPARTMENT_OTHER): Payer: Self-pay | Admitting: General Surgery

## 2023-01-14 ENCOUNTER — Encounter (HOSPITAL_BASED_OUTPATIENT_CLINIC_OR_DEPARTMENT_OTHER): Payer: Medicaid Other | Attending: General Surgery | Admitting: General Surgery

## 2023-01-14 ENCOUNTER — Ambulatory Visit: Payer: Medicaid Other | Admitting: Student

## 2023-01-14 ENCOUNTER — Other Ambulatory Visit (HOSPITAL_COMMUNITY): Payer: Self-pay

## 2023-01-14 ENCOUNTER — Encounter: Payer: Self-pay | Admitting: Student

## 2023-01-14 VITALS — BP 93/72 | HR 89 | Temp 97.7°F | Ht 71.0 in | Wt 202.3 lb

## 2023-01-14 DIAGNOSIS — M869 Osteomyelitis, unspecified: Secondary | ICD-10-CM | POA: Diagnosis not present

## 2023-01-14 DIAGNOSIS — I959 Hypotension, unspecified: Secondary | ICD-10-CM | POA: Insufficient documentation

## 2023-01-14 DIAGNOSIS — E1169 Type 2 diabetes mellitus with other specified complication: Secondary | ICD-10-CM | POA: Insufficient documentation

## 2023-01-14 DIAGNOSIS — M8618 Other acute osteomyelitis, other site: Secondary | ICD-10-CM | POA: Diagnosis not present

## 2023-01-14 DIAGNOSIS — E11621 Type 2 diabetes mellitus with foot ulcer: Secondary | ICD-10-CM | POA: Diagnosis not present

## 2023-01-14 DIAGNOSIS — L97312 Non-pressure chronic ulcer of right ankle with fat layer exposed: Secondary | ICD-10-CM | POA: Insufficient documentation

## 2023-01-14 DIAGNOSIS — Z89421 Acquired absence of other right toe(s): Secondary | ICD-10-CM | POA: Diagnosis not present

## 2023-01-14 DIAGNOSIS — Z7984 Long term (current) use of oral hypoglycemic drugs: Secondary | ICD-10-CM | POA: Diagnosis not present

## 2023-01-14 DIAGNOSIS — R112 Nausea with vomiting, unspecified: Secondary | ICD-10-CM | POA: Insufficient documentation

## 2023-01-14 DIAGNOSIS — R809 Proteinuria, unspecified: Secondary | ICD-10-CM

## 2023-01-14 DIAGNOSIS — Z89432 Acquired absence of left foot: Secondary | ICD-10-CM | POA: Diagnosis not present

## 2023-01-14 DIAGNOSIS — E1159 Type 2 diabetes mellitus with other circulatory complications: Secondary | ICD-10-CM

## 2023-01-14 DIAGNOSIS — Z748 Other problems related to care provider dependency: Secondary | ICD-10-CM | POA: Insufficient documentation

## 2023-01-14 MED ORDER — ROSUVASTATIN CALCIUM 20 MG PO TABS
20.0000 mg | ORAL_TABLET | Freq: Every day | ORAL | 1 refills | Status: DC
  Filled 2023-01-14: qty 30, 30d supply, fill #0

## 2023-01-14 NOTE — Assessment & Plan Note (Signed)
Assessment: Patient with historically low blood pressures reading. Asymptomatic. Do not believe septic or from recent nausea and vomiting.   Plan: - continue to monitor blood pressures.

## 2023-01-14 NOTE — Assessment & Plan Note (Addendum)
Assessment: Post operative left TMA and right second toe amputation with well healing wounds. He has follow up with wound care today. Continues to have open right malleolar wound. Instructed him to keep the area covered as instructed  Plan: - continue to follow with wound care and podiatry

## 2023-01-14 NOTE — Assessment & Plan Note (Signed)
Assessment: Patient no longer has access to a vehicle and with history of poor vision and recent bilateral lower extremity amputations do not believe he is a candidate to drive at this time. Filled out transportation assistance form today. He has been relying on his neighbor for assistance.  Plan: - follow up needs and assistance

## 2023-01-14 NOTE — Progress Notes (Addendum)
CC: follow up nausea/vomiting and bilateral lower extremity wounds  HPI:  Carl Conley is a 37 y.o. male living with a history stated below and presents today for acute nausea and vomiting. Please see problem based assessment and plan for additional details.  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   Depression 10/31/2016   Diabetes mellitus ([place])    dx date 2004 at age 49   Low back pain 09/01/2016   Visual snow syndrome     Current Outpatient Medications on File Prior to Visit  Medication Sig Dispense Refill   acyclovir (ZOVIRAX) 400 MG tablet Take 400 mg by mouth 2 (two) times daily.     Bacitracin-Polymyxin B (NEOSPORIN EX) Apply 1 Application topically daily as needed (antibiotic ointment). (Patient not taking: Reported on 12/18/2022)     blood glucose meter kit and supplies KIT Use up to four times daily as directed. 1 each 0   Canagliflozin-metFORMIN HCl ER (INVOKAMET XR) [phone] MG TB24 Take 2 tablets by mouth daily. (Patient taking differently: Take 1 tablet by mouth 2 (two) times daily.) 90 tablet 3   citalopram (CELEXA) 20 MG tablet TAKE 1 TABLET BY MOUTH EVERY DAY (Patient taking differently: Take 20 mg by mouth daily.) 90 tablet 2   timolol (TIMOPTIC) 0.5 % ophthalmic solution Place 1 drop into the right eye every morning. (Patient not taking: Reported on 12/18/2022)     VITAMIN D PO Take 1 tablet by mouth daily.     No current facility-administered medications on file prior to visit.    Family History  Problem Relation Age of Onset   Depression Mother     Social History   Socioeconomic History   Marital status: Single    Spouse name: Not on file   Number of children: 0   Years of education: BA   Highest education level: Bachelor's degree (e.g., BA, AB, BS)  Occupational History   Occupation: N/A  Tobacco Use   Smoking status: Never   Smokeless tobacco: Never  Vaping Use   Vaping Use: Never used   Substance and Sexual Activity   Alcohol use: No   Drug use: No   Sexual activity: Not on file  Other Topics Concern   Not on file  Social History Narrative   Denies caffeine use   Social Determinants of Health   Financial Resource Strain: Not on file  Food Insecurity: No Food Insecurity (11/26/2022)   Hunger Vital Sign    Worried About Running Out of Food in the Last Year: Never true    Ran Out of Food in the Last Year: Never true  Transportation Needs: No Transportation Needs (11/26/2022)   PRAPARE - Hydrologist (Medical): No    Lack of Transportation (Non-Medical): No  Physical Activity: Not on file  Stress: Not on file  Social Connections: Not on file  Intimate Partner Violence: Not At Risk (11/26/2022)   Humiliation, Afraid, Rape, and Kick questionnaire    Fear of Current or Ex-Partner: No    Emotionally Abused: No    Physically Abused: No    Sexually Abused: No    Review of Systems: ROS negative except for what is noted on the assessment and plan.  Vitals:   01/14/23 1102  BP: 93/72  Pulse: 89  Temp: 97.7 F (36.5 C)  TempSrc: Oral  SpO2: 100%  Weight: 202 lb 4.8 oz (91.8 kg)  Height: '5\' 11"'$  (1.803 m)    Physical Exam: Constitutional: well-appearing , in no acute distress HENT: normocephalic atraumatic Eyes: conjunctiva non-erythematous Neck: supple Pulmonary/Chest: normal work of breathing on room air Abdominal: soft, non-tender, non-distended MSK: normal bulk and tone. 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 Right lateral malleolar wound with rolling edges, clean without drainage or erythema.  Neurological: alert & oriented x 3 Skin: warm and dry Psych: normal mood  Assessment & Plan:   Diabetic osteomyelitis ([place]) Assessment: Post operative left TMA and right second toe amputation with well healing wounds. He has follow up with wound care today.  Continues to have open right malleolar wound. Instructed him to keep the area covered as instructed  Plan: - continue to follow with wound care and podiatry    Nausea and vomiting Assessment: Endorses nausea and vomiting after taking inovkamet in the morning prior to breakfast. Denies this occurring when he takes an evening dose. Discussed taking both tablets together in the evening. If nausea/vomiting with both can consider taking once daily. A1c well controlled despite recent infections.   Do not suspect infectious process. Because it is occurs specifically after meals, lower suspicion for gastroparesis.   Plan: -take invokamet with food in the evening   Dependent for transportation Assessment: Patient no longer has access to a vehicle and with history of poor vision and recent bilateral lower extremity amputations do not believe he is a candidate to drive at this time. Filled out transportation assistance form today. He has been relying on his neighbor for assistance.  Plan: - follow up needs and assistance  Low blood pressure Assessment: Patient with historically low blood pressures reading. Asymptomatic. Do not believe septic or from recent nausea and vomiting.   Plan: - continue to monitor blood pressures.   Patient also agreeable to allow Ms. Justin Mend his neighbor to be involved in his healthcare team and can call in to help with information appointments etc.   Patient discussed with Dr. Fanny Bien, D.O. [facility], PGY-3 Phone: [phone] Date 01/14/2023 Time 7:49 PM

## 2023-01-14 NOTE — Patient Instructions (Signed)
Thank you, Gala Lewandowsky for allowing [place] to provide your care today. Today we discussed .   Foot wounds Your feet are healing well. Please come them clean and dry and remember to shower every other day.   Nausea and vomiting Please try taking your invokamet at night, 2 pills. If you continue to vomit, try taking one pill  I have ordered the following labs for you:   Lab Orders         Microalbumin / Creatinine Urine Ratio      Referrals ordered today:   Referral Orders  No referral(s) requested today     I have ordered the following medication/changed the following medications:   Stop the following medications: Medications Discontinued During This Encounter  Medication Reason   rosuvastatin (CRESTOR) 20 MG tablet Reorder     Start the following medications: Meds ordered this encounter  Medications   rosuvastatin (CRESTOR) 20 MG tablet    Sig: Take 1 tablet (20 mg total) by mouth daily.    Dispense:  90 tablet    Refill:  1    [place] patient      Should you have any questions or concerns please call the internal medicine clinic at [phone].    Sanjuana Letters, D.O. [place]

## 2023-01-14 NOTE — Assessment & Plan Note (Signed)
Assessment: Endorses nausea and vomiting after taking inovkamet in the morning prior to breakfast. Denies this occurring when he takes an evening dose. Discussed taking both tablets together in the evening. If nausea/vomiting with both can consider taking once daily. A1c well controlled despite recent infections.   Do not suspect infectious process. Because it is occurs specifically after meals, lower suspicion for gastroparesis.   Plan: -take invokamet with food in the evening

## 2023-01-15 ENCOUNTER — Telehealth: Payer: Self-pay

## 2023-01-15 LAB — MICROALBUMIN / CREATININE URINE RATIO
Creatinine, Urine: 86 mg/dL
Microalb/Creat Ratio: 69 mg/g creat — ABNORMAL HIGH (ref 0–29)
Microalbumin, Urine: 59.7 ug/mL

## 2023-01-15 NOTE — Telephone Encounter (Signed)
Return call to [facility] RN with [facility]. She stated pt has Medicaid now; she wants to help pt with transportation, getting a SW , and possible PCS services.. Requesting verbal order "Nursing once a week x 2 more weeks". VO given - sending to PCP for approval or denial.

## 2023-01-15 NOTE — Telephone Encounter (Signed)
Beth with [facility] requesting VO for nursing. Please call back.

## 2023-01-15 NOTE — Telephone Encounter (Signed)
Agree. We discussed him calling medicaid to work with one of their social workers. We filled out transportation forms for him yesterday

## 2023-01-15 NOTE — Telephone Encounter (Signed)
Noted, thanks!

## 2023-01-16 DIAGNOSIS — R809 Proteinuria, unspecified: Secondary | ICD-10-CM | POA: Insufficient documentation

## 2023-01-16 NOTE — Progress Notes (Signed)
MAN, MOM ([id number]) [id number] Page 1 of 1 Visit Report for 01/14/2023 SuperBill Details Patient Name: Date of Service: TUCK, TIN IN S. 01/14/2023 Medical Record Number: [id number] Patient Account Number: [id number] Date of Birth/Sex: Treating RN: 1986/03/26 (37 y.o. 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

## 2023-01-16 NOTE — Assessment & Plan Note (Signed)
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 Continue invokamet.   Plan: - continue SGLT-2i - monitor annually

## 2023-01-16 NOTE — Progress Notes (Signed)
YIA, HAYMOND ([id number]) [id number] Page 1 of 5 Visit Report for 01/14/2023 Arrival Information Details Patient Name: Date of Service: Carl Conley, Carl Conley IN S. 01/14/2023 2:45 PM Medical Record Number: [id number] Patient Account Number: [id number] Date of Birth/Sex: Treating RN: 08/25/1986 (37 y.o. M) Primary Care Nasiir Monts: Riesa Pope Other Clinician: Referring Zachariah Pavek: Treating Waylin Dorko/Extender: Graylin Shiver, Vasilios Weeks in Treatment: 4 Visit Information History Since Last Visit All ordered tests and consults were completed: No Patient Arrived: Ambulatory Added or deleted any medications: Yes Arrival Time: 14:43 Any new allergies or adverse reactions: No Transfer Assistance: None Had a fall or experienced change in No Patient Identification Verified: Yes activities of daily living that may affect Secondary Verification Process Completed: Yes risk of falls: Patient Requires Transmission-Based Precautions: No Signs or symptoms of abuse/neglect since last visito No Patient Has Alerts: No Hospitalized since last visit: No Implantable device outside of the clinic excluding No cellular tissue based products placed in the center since last visit: Has Dressing in Place as Prescribed: Yes Pain Present Now: No Notes Spoke with Dr. Celine Ahr about patient still seeing podiatry and ID for wound still under global period, unsure why patient is still here. Per Arvle Grabe patient should not be here should follow up with podiatry. Spoke with patient, explained to patient to continue podiatry orders for wound care and seeing podiatry as they are responsible for his wound care. Patient in agreement. Electronic Signature(s) Signed: 01/15/2023 4:59:46 PM By: Deon Pilling RN, BSN Entered By: Deon Pilling on 01/14/2023 15:22:52 -------------------------------------------------------------------------------- Clinic Level of Care Assessment  Details Patient Name: Date of Service: Carl Conley, Carl Conley IN S. 01/14/2023 2:45 PM Medical Record Number: [id number] Patient Account Number: [id number] Date of Birth/Sex: Treating RN: Aug 03, 1986 (37 y.o. Carl Conley Primary Care Anyelo Mccue: Riesa Pope Other Clinician: Referring Tyshika Baldridge: Treating Kenderick Kobler/Extender: Graylin Shiver, Vasilios Weeks in Treatment: 4 Clinic Level of Care Assessment Items TOOL 4 Quantity Score X- 1 0 Use when only an EandM is performed on FOLLOW-UP visit ASSESSMENTS - Nursing Assessment / Reassessment X- 1 10 Reassessment of Co-morbidities (includes updates in patient status) X- 1 5 Reassessment of Adherence to Treatment Plan ASSESSMENTS - Wound and Skin A ssessment / Reassessment X - Simple Wound Assessment / Reassessment - one wound 1 5 '[]'$  - 0 Complex Wound Assessment / Reassessment - multiple wounds '[]'$  - 0 Dermatologic / Skin Assessment (not related to wound area) ASSESSMENTS - Focused Assessment '[]'$  - 0 Circumferential Edema Measurements - multi extremities '[]'$  - 0 Nutritional Assessment / Counseling / Intervention '[]'$  - 0 Lower Extremity Assessment (monofilament, tuning fork, pulses) '[]'$  - 0 Peripheral Arterial Disease Assessment (using hand held doppler) ASSESSMENTS - Ostomy and/or Continence Assessment and Care Carl Conley, Carl Conley ([id number]) [id number] Page 2 of 5 '[]'$  - 0 Incontinence Assessment and Management '[]'$  - 0 Ostomy Care Assessment and Management (repouching, etc.) PROCESS - Coordination of Care '[]'$  - 0 Simple Patient / Family Education for ongoing care '[]'$  - 0 Complex (extensive) Patient / Family Education for ongoing care '[]'$  - 0 Staff obtains Programmer, systems, Records, T Results / Process Orders est '[]'$  - 0 Staff telephones HHA, Nursing Homes / Clarify orders / etc '[]'$  - 0 Routine Transfer to another Facility (non-emergent condition) '[]'$  - 0 Routine Hospital Admission (non-emergent  condition) '[]'$  - 0 New Admissions / Biomedical engineer / Ordering NPWT Apligraf, etc. , '[]'$  - 0 Emergency Hospital Admission (emergent condition) '[]'$  - 0 Simple Discharge Coordination '[]'$  - 0 Complex (extensive) Discharge Coordination PROCESS - Special  Needs '[]'$  - 0 Pediatric / Minor Patient Management '[]'$  - 0 Isolation Patient Management '[]'$  - 0 Hearing / Language / Visual special needs '[]'$  - 0 Assessment of Community assistance (transportation, D/C planning, etc.) '[]'$  - 0 Additional assistance / Altered mentation '[]'$  - 0 Support Surface(s) Assessment (bed, cushion, seat, etc.) INTERVENTIONS - Wound Cleansing / Measurement X - Simple Wound Cleansing - one wound 1 5 '[]'$  - 0 Complex Wound Cleansing - multiple wounds X- 1 5 Wound Imaging (photographs - any number of wounds) '[]'$  - 0 Wound Tracing (instead of photographs) X- 1 5 Simple Wound Measurement - one wound '[]'$  - 0 Complex Wound Measurement - multiple wounds INTERVENTIONS - Wound Dressings X - Small Wound Dressing one or multiple wounds 1 10 '[]'$  - 0 Medium Wound Dressing one or multiple wounds '[]'$  - 0 Large Wound Dressing one or multiple wounds '[]'$  - 0 Application of Medications - topical '[]'$  - 0 Application of Medications - injection INTERVENTIONS - Miscellaneous '[]'$  - 0 External ear exam '[]'$  - 0 Specimen Collection (cultures, biopsies, blood, body fluids, etc.) '[]'$  - 0 Specimen(s) / Culture(s) sent or taken to Lab for analysis '[]'$  - 0 Patient Transfer (multiple staff / Civil Service fast streamer / Similar devices) '[]'$  - 0 Simple Staple / Suture removal (25 or less) '[]'$  - 0 Complex Staple / Suture removal (26 or more) '[]'$  - 0 Hypo / Hyperglycemic Management (close monitor of Blood Glucose) '[]'$  - 0 Ankle / Brachial Index (ABI) - do not check if billed separately X- 1 5 Vital Signs Has the patient been seen at the hospital within the last three years: Yes Total Score: 50 Level Of Care: New/Established - Level 2 Carl Conley, Carl Conley  ([id number]) [id number] Page 3 of 5 Electronic Signature(s) Signed: 01/15/2023 4:59:46 PM By: Deon Pilling RN, BSN Entered By: Deon Pilling on 01/14/2023 15:24:43 -------------------------------------------------------------------------------- Encounter Discharge Information Details Patient Name: Date of Service: Carl Conley IN S. 01/14/2023 2:45 PM Medical Record Number: [id number] Patient Account Number: [id number] Date of Birth/Sex: Treating RN: 06/18/1986 (37 y.o. Carl Conley Primary Care Kimmerly Lora: Riesa Pope Other Clinician: Referring Commodore Bellew: Treating Saliha Salts/Extender: Graylin Shiver, Vasilios Weeks in Treatment: 4 Encounter Discharge Information Items Discharge Condition: Stable Ambulatory Status: Ambulatory Discharge Destination: Home Transportation: Private Auto Accompanied By: self Schedule Follow-up Appointment: No Clinical Summary of Care: Electronic Signature(s) Signed: 01/15/2023 4:59:46 PM By: Deon Pilling RN, BSN Entered By: Deon Pilling on 01/14/2023 15:23:26 -------------------------------------------------------------------------------- Lower Extremity Assessment Details Patient Name: Date of Service: Carl Conley IN S. 01/14/2023 2:45 PM Medical Record Number: [id number] Patient Account Number: [id number] Date of Birth/Sex: Treating RN: Jun 10, 1986 (37 y.o. M) Primary Care Merriel Zinger: Riesa Pope Other Clinician: Referring Shahir Karen: Treating Audrea Bolte/Extender: Graylin Shiver, Vasilios Weeks in Treatment: 4 Edema Assessment Assessed: [Left: No] [Right: No] Edema: [Left: N] [Right: o] Calf Left: Right: Point of Measurement: From Medial Instep 36 cm Ankle Left: Right: Point of Measurement: From Medial Instep 21.5 cm Electronic Signature(s) Signed: 01/14/2023 3:55:41 PM By: Erenest Blank Entered By: Erenest Blank on 01/14/2023  14:51:28 -------------------------------------------------------------------------------- Multi-Disciplinary Care Plan Details Patient Name: Date of Service: Carl Conley IN S. 01/14/2023 2:45 PM Medical Record Number: [id number] Patient Account Number: [id number] Date of Birth/Sex: Treating RN: 10/22/1986 (37 y.o. Carl Conley Primary Care Takiah Maiden: Riesa Pope Other Clinician: Referring Jadeyn Hargett: Treating Amarah Brossman/Extender: Graylin Shiver, Vasilios Weeks in Treatment: 4 Carl Conley, Carl Conley ([id number]) [id number] Page 4 of 5 Multidisciplinary Care Plan reviewed with physician Active Inactive Electronic Signature(s) Signed: 01/15/2023 4:59:46  PM By: Deon Pilling RN, BSN Entered By: Deon Pilling on 01/14/2023 15:24:02 -------------------------------------------------------------------------------- Pain Assessment Details Patient Name: Date of Service: Carl Conley, Carl Conley IN S. 01/14/2023 2:45 PM Medical Record Number: [id number] Patient Account Number: [id number] Date of Birth/Sex: Treating RN: 09-12-1986 (36 y.o. M) Primary Care Aloria Looper: Riesa Pope Other Clinician: Referring Nasiah Polinsky: Treating Shashank Kwasnik/Extender: Graylin Shiver, Vasilios Weeks in Treatment: 4 Active Problems Location of Pain Severity and Description of Pain Patient Has Paino No Site Locations Pain Management and Medication Current Pain Management: Electronic Signature(s) Signed: 01/14/2023 3:55:41 PM By: Erenest Blank Entered By: Erenest Blank on 01/14/2023 14:45:33 -------------------------------------------------------------------------------- Wound Assessment Details Patient Name: Date of Service: Carl Conley IN S. 01/14/2023 2:45 PM Medical Record Number: [id number] Patient Account Number: [id number] Date of Birth/Sex: Treating RN: [birth year] (37 y.o. M) Primary Care Tavaria Mackins: Riesa Pope Other Clinician: Referring  Kerri-Anne Haeberle: Treating Sylvan Sookdeo/Extender: Graylin Shiver, Vasilios Weeks in Treatment: 4 Wound Status Wound Number: 1 Primary Etiology: Diabetic Wound/Ulcer of the Lower Extremity Wound Location: Right, Lateral Malleolus Wound Status: Open Wounding Event: Gradually Appeared Comorbid History: Cataracts, Type II Diabetes, Osteomyelitis, Neuropathy Date Acquired: 05/05/2022 Weeks Of Treatment: 4 Clustered Wound: No Carl Conley, Carl Conley ([id number]) [id number] Page 5 of 5 Photos Wound Measurements Length: (cm) 2 Width: (cm) 1.9 Depth: (cm) 0.3 Area: (cm) 2.985 Volume: (cm) 0.895 % Reduction in Area: 45.7% % Reduction in Volume: -62.7% Epithelialization: Small (1-33%) Tunneling: No Undermining: No Wound Description Classification: Grade 3 Wound Margin: Flat and Intact Exudate Amount: Small Exudate Type: Serous Exudate Color: amber Foul Odor After Cleansing: No Slough/Fibrino Yes Wound Bed Granulation Amount: Small (1-33%) Exposed Structure Granulation Quality: Pink Fascia Exposed: No Necrotic Amount: None Present (0%) Fat Layer (Subcutaneous Tissue) Exposed: Yes Tendon Exposed: No Muscle Exposed: No Joint Exposed: No Bone Exposed: No Periwound Skin Texture Texture Color No Abnormalities Noted: Yes No Abnormalities Noted: Yes Moisture Temperature / Pain No Abnormalities Noted: Yes Temperature: No Abnormality Electronic Signature(s) Signed: 01/14/2023 3:55:41 PM By: Erenest Blank Entered By: Erenest Blank on 01/14/2023 14:54:12 -------------------------------------------------------------------------------- Vitals Details Patient Name: Date of Service: Carl Conley IN S. 01/14/2023 2:45 PM Medical Record Number: [id number] Patient Account Number: [id number] Date of Birth/Sex: Treating RN: [birth year] (37 y.o. M) Primary Care Vihan Santagata: Riesa Pope Other Clinician: Referring Kanya Potteiger: Treating Ekin Pilar/Extender: Graylin Shiver, Vasilios Weeks in Treatment: 4 Vital Signs Time Taken: 14:45 Temperature (F): 98.2 Height (in): 71 Pulse (bpm): 75 Weight (lbs): 230 Respiratory Rate (breaths/min): 18 Body Mass Index (BMI): 32.1 Blood Pressure (mmHg): 93/67 Reference Range: 80 - 120 mg / dl Electronic Signature(s) Signed: 01/14/2023 3:55:41 PM By: Erenest Blank Entered By: Erenest Blank on 01/14/2023 14:45:27

## 2023-01-17 ENCOUNTER — Ambulatory Visit: Payer: Medicaid Other | Admitting: Podiatry

## 2023-01-17 DIAGNOSIS — L97314 Non-pressure chronic ulcer of right ankle with necrosis of bone: Secondary | ICD-10-CM

## 2023-01-17 DIAGNOSIS — E1169 Type 2 diabetes mellitus with other specified complication: Secondary | ICD-10-CM

## 2023-01-17 DIAGNOSIS — Z9889 Other specified postprocedural states: Secondary | ICD-10-CM

## 2023-01-17 NOTE — Progress Notes (Signed)
Subjective:  Patient ID: Carl Conley, male    DOB: 1985-12-18,  MRN: [id number]  Chief Complaint  Patient presents with   Routine Post Op    POV 2. Nurse came by on Tuesday to check on his wound.    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  37 y.o. male returns for first post-op check status post the above procedures that were completed on November 19, 2022.  He is seeing wound care doctor yesterday who remove the Integra layer on the right lateral ankle.  He denies any issues since surgery he is now walking in regular shoes.  Sutures and staples are still intact.  He has been keeping the right and left foot dry since surgery.  Denies any concerns.  Denies any pain.  He is to remain on cefepime until February 29 and then the PICC will be removed.  Review of Systems: Negative except as noted in the HPI. Denies N/V/F/Ch.   Objective:  There were no vitals filed for this visit. There is no height or weight on file to calculate BMI. Constitutional Well developed. Well nourished.  Vascular Foot warm and well perfused. Capillary refill normal to all digits.  Calf is soft and supple, no posterior calf or knee pain, negative Homans' sign  Neurologic Normal speech. Oriented to person, place, and time. Epicritic sensation to light touch grossly present bilaterally.  Dermatologic Left foot transmetatarsal amputation site and right foot second toe amputation site fully healed with healthy skin and minimal to no callus or eschar formation Attention to the right lateral ankle there is an ulceration present with fibrotic tissue at the central base and healthy tissue surrounding.  Upon debridement of the wound there was no probe to bone the wound measured approximately 1 x 0.8 x 0.3 cm postdebridement.  There was  good healthy bleeding no purulent drainage no maceration or erythema surrounding the wound       Orthopedic: Tenderness to palpation noted about the surgical site.   Multiple view plain film radiographs: Deferred at this visit Assessment:   1. Postoperative state   2. 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 Diabetic osteomyelitis ([place])     Plan:  Patient was evaluated and treated and all questions answered.  S/p foot surgery bilaterally, right foot lateral ankle ulceration debridement and bone biopsy as well as second toe amputation MPJ level.  Left foot transmetatarsal amputation and tendo Achilles lengthening -Progressing as expected post-operatively.  All surgical sites now healed with the exception of the right lateral ankle ulcer.  Left foot TMA and right foot second toe amputation well-healed.  Right lateral ankle ulcer also improving with local wound care -XR: Deferred at this visit -WB Status: Weightbearing as tolerated in regular shoes -Medications: Continue antibiotics per ID recommendations -For the right ankle ulceration the patient has my approval and encouragement to continue proceeding with wound care center visits for the right lateral ankle ulceration for ongoing and continuous weekly wound care and debridements.  Appreciate assistance with wound care per wound care center -At time being the patient is using Aquacel Ag's layer applied with gauze and gauze wrap every 2 to 3 days.  I want him to continue with this dressing change on that frequency as he is currently doing as the wound does  appear to be improving  Procedure: Excisional Debridement of Wound Rationale: Removal of non-viable soft tissue from the wound to promote healing.  Anesthesia: none Post-Debridement Wound Measurements: 1 cm x 0.8 cm x 0.3 cm  Type of Debridement: Sharp Excisional Tissue Removed: Non-viable soft tissue Depth of Debridement: subcutaneous tissue. Technique: Sharp  excisional debridement to bleeding, viable wound base.  Dressing: Dry, sterile, compression dressing. Disposition: Patient tolerated procedure well.   Return in about 3 weeks (around 02/07/2023) for Follow-up right ankle ulceration.             Carl Conley, DPM [facility]

## 2023-01-18 NOTE — Progress Notes (Signed)
Internal Medicine Clinic Attending  Case discussed with Dr. Katsadouros at the time of the visit.  We reviewed the resident's history and exam and pertinent patient test results.  I agree with the assessment, diagnosis, and plan of care documented in the resident's note.  

## 2023-01-23 ENCOUNTER — Telehealth: Payer: Self-pay | Admitting: *Deleted

## 2023-01-23 NOTE — Telephone Encounter (Signed)
Received call from [place], [place] with [facility]. Requesting VO to continue Sierra 1 week 4 to work on right ankle wound, disease management, and diabetes education. Also, requesting PT Eval for LE strengthening. Verbal auth given. Will route to PCP for agreement/denial.  Beth reporting patient's BP yesterday was 90/68. He felt lightheaded and notes intermittent dizziness. BP does not drop with position changes. Patient did have vomiting and diarrhea 2 weeks ago thought to be 2/2 invokamet. He has not had any V/D in the last few days, and Beth thinks patient is hydrating well.   Patient now has MCD and Eustaquio Maize is requesting eval for PCS. States patient is weak and unsteady 2/2 recent toe amputations and needs help with bathing, and other household tasks.

## 2023-01-23 NOTE — Telephone Encounter (Signed)
Thank you :)

## 2023-01-27 ENCOUNTER — Encounter: Payer: Self-pay | Admitting: Student

## 2023-01-27 DIAGNOSIS — Z89439 Acquired absence of unspecified foot: Secondary | ICD-10-CM | POA: Insufficient documentation

## 2023-01-27 DIAGNOSIS — S98131A Complete traumatic amputation of one right lesser toe, initial encounter: Secondary | ICD-10-CM | POA: Insufficient documentation

## 2023-01-29 ENCOUNTER — Other Ambulatory Visit (HOSPITAL_COMMUNITY): Payer: Self-pay

## 2023-01-29 ENCOUNTER — Telehealth: Payer: Self-pay | Admitting: *Deleted

## 2023-01-29 ENCOUNTER — Other Ambulatory Visit: Payer: Self-pay

## 2023-01-29 MED ORDER — GLUCOSE BLOOD VI STRP
ORAL_STRIP | 0 refills | Status: DC
  Filled 2023-01-29: qty 100, 25d supply, fill #0

## 2023-01-29 MED ORDER — ACCU-CHEK SOFTCLIX LANCETS MISC
0 refills | Status: DC
  Filled 2023-01-29: qty 100, 25d supply, fill #0

## 2023-01-29 NOTE — Telephone Encounter (Signed)
He can try taking one dose of invokamet in the evening and see how he does. His BP's have remained low and he has been asymptomatic. We will check his orthostatics, possible autonomic dysfunction with his history of diabetes.

## 2023-01-29 NOTE — Telephone Encounter (Signed)
RTC to patient when told to take Invokamet in the evening.  Patient states is currently taking 2 in the pm.  Clarification with Dr. Beckie Salts is to try only taking 1 tablet in the evening.  Patient was called and informed to only take 1 tablet of the Invokamet in the evening now.

## 2023-01-29 NOTE — Telephone Encounter (Signed)
Call from [place], [employer].  Patient continues t have loose stools.  Blood pressure today was 90/62.  Patent is drinking some fluids.  Lung sounds are diminished in the bases.  No shortness of breath.  No dizziness.  Did go away for a few days.  Returned 2 days.  Last time was 4:40 AM..  Has Medicaid now.  Has not received his Glucose Monitor. Out of pocket price is expensive reason why patient did not receive.  Call to patient informed him of reason he had not received his Meter.  Patient was able to give me his ID # [id number].  Patient was informed of his appointment on 02/06/2023 at 1:45 PM in the Clinics. Patient is set up fr Medicaid Transportation now. Will need time to schedule transportation..  Call to [facility] gave them the ID #.  Patient has [facility] Medicaid.  Pharmacy is running Account# to see what Meter patient will qualify for.

## 2023-01-30 ENCOUNTER — Telehealth: Payer: Self-pay | Admitting: *Deleted

## 2023-01-30 NOTE — Telephone Encounter (Signed)
Spoke with patient to let him know that the [facility] form has been faxed. Office will contact him to set up appointment. PCS form faxed to [employer] for review / scheduling. [id number] / [phone]. Confirmation 10:51 am.

## 2023-01-31 ENCOUNTER — Other Ambulatory Visit: Payer: Self-pay

## 2023-01-31 ENCOUNTER — Ambulatory Visit: Payer: Medicaid Other | Admitting: Internal Medicine

## 2023-01-31 ENCOUNTER — Telehealth: Payer: Self-pay | Admitting: Student

## 2023-01-31 VITALS — BP 99/66 | HR 87 | Temp 98.4°F | Ht 71.0 in | Wt 210.0 lb

## 2023-01-31 DIAGNOSIS — Z7984 Long term (current) use of oral hypoglycemic drugs: Secondary | ICD-10-CM | POA: Diagnosis not present

## 2023-01-31 DIAGNOSIS — L089 Local infection of the skin and subcutaneous tissue, unspecified: Secondary | ICD-10-CM

## 2023-01-31 DIAGNOSIS — E11628 Type 2 diabetes mellitus with other skin complications: Secondary | ICD-10-CM | POA: Diagnosis present

## 2023-01-31 LAB — GLUCOSE, POCT (MANUAL RESULT ENTRY): POC Glucose: 169 mg/dl — AB (ref 70–99)

## 2023-01-31 NOTE — Patient Instructions (Signed)
Labs today Continue wound care with podiatry   See me again in 3 months

## 2023-01-31 NOTE — Telephone Encounter (Signed)
Nurse case Manager calling from [facility] l [phone] reporting PCS services is denied does not qualify.  Pt is independent with his ADL's and pt states he only needs help with cleaning and they do cover just those services.   Please call back if any questions.

## 2023-01-31 NOTE — Progress Notes (Addendum)
[place] for Infectious Disease  Patient Active Problem List   Diagnosis Date Noted   History of transmetatarsal amputation of foot ([place]) 01/27/2023   Amputation of toe of right foot ([place]) 01/27/2023   Microalbuminuria 01/16/2023   Nausea and vomiting 01/14/2023   Dependent for transportation 01/14/2023   Low blood pressure 01/14/2023   Encounter for screening involving social determinants of health (SDoH) 12/13/2022   Pressure injury of skin 12/03/2022   Diabetic osteomyelitis ([place]) 11/01/2022   Healthcare maintenance 07/17/2021   Seborrheic dermatitis 11/11/2020   Hypercalcemia 11/11/2020   Tension headache 09/10/2019   NAFLD (nonalcoholic fatty liver disease) 04/01/2019   ADHD    Visual disturbance 10/31/2016   Depression 10/31/2016   Diabetes ([place]) 04/13/2016      Subjective:    Patient ID: Carl Conley, male    DOB: 1986-07-28, 37 y.o.   MRN: [id number]  No chief complaint on file.   HPI:  Carl Conley is a 37 y.o. male here for f/u diabetic foot infection   He grew mssa and psA and was placed on 6 weeks cefepime until 2/29 He has been seeing wound care after discharge Tolerating cefepime No concern with wound  No f/c Once a day loose stool  No issue with picc   01/31/23 id clinic visit He saw podiatry 3/14. I reviewed picture of the left tma stump and the right 2nd toe disarticulation incision. Both healed and podiatry very happy with progress He is weight bearing as tolerated Picc removed since he finished cefepime on 01/03/23   Allergies  Allergen Reactions   Doxycycline Nausea And Vomiting      Outpatient Medications Prior to Visit  Medication Sig Dispense Refill   Accu-Chek Softclix Lancets lancets use to test blood sugar four times daily 100 each 0   acyclovir (ZOVIRAX) 400 MG tablet Take 400 mg by mouth 2 (two) times daily.     Bacitracin-Polymyxin B (NEOSPORIN EX) Apply 1 Application topically daily as needed  (antibiotic ointment). (Patient not taking: Reported on 12/18/2022)     blood glucose meter kit and supplies KIT Use up to four times daily as directed. 1 each 0   Canagliflozin-metFORMIN HCl ER (INVOKAMET XR) [phone] MG TB24 Take 2 tablets by mouth daily. (Patient taking differently: Take 1 tablet by mouth 2 (two) times daily.) 90 tablet 3   citalopram (CELEXA) 20 MG tablet TAKE 1 TABLET BY MOUTH EVERY DAY (Patient taking differently: Take 20 mg by mouth daily.) 90 tablet 2   glucose blood test strip use to test blood sugar four times daily 100 each 0   rosuvastatin (CRESTOR) 20 MG tablet Take 1 tablet (20 mg total) by mouth daily. 90 tablet 1   timolol (TIMOPTIC) 0.5 % ophthalmic solution Place 1 drop into the right eye every morning. (Patient not taking: Reported on 12/18/2022)     VITAMIN D PO Take 1 tablet by mouth daily.     No facility-administered medications prior to visit.     Social History   Socioeconomic History   Marital status: Single    Spouse name: Not on file   Number of children: 0   Years of education: BA   Highest education level: Bachelor's degree (e.g., BA, AB, BS)  Occupational History   Occupation: N/A  Tobacco Use   Smoking status: Never   Smokeless tobacco: Never  Vaping Use   Vaping Use: Never used  Substance and Sexual Activity  Alcohol use: No   Drug use: No   Sexual activity: Not on file  Other Topics Concern   Not on file  Social History Narrative   Denies caffeine use   Social Determinants of Health   Financial Resource Strain: Not on file  Food Insecurity: No Food Insecurity (11/26/2022)   Hunger Vital Sign    Worried About Running Out of Food in the Last Year: Never true    Ran Out of Food in the Last Year: Never true  Transportation Needs: No Transportation Needs (11/26/2022)   PRAPARE - Hydrologist (Medical): No    Lack of Transportation (Non-Medical): No  Physical Activity: Not on file  Stress: Not on  file  Social Connections: Not on file  Intimate Partner Violence: Not At Risk (11/26/2022)   Humiliation, Afraid, Rape, and Kick questionnaire    Fear of Current or Ex-Partner: No    Emotionally Abused: No    Physically Abused: No    Sexually Abused: No      Review of Systems    All other ros negative  Objective:    BP (!) 86/65   Pulse 87   Temp 98.4 F (36.9 C) (Oral)   Ht 5\' 11"  (1.803 m)   Wt 210 lb (95.3 kg)   SpO2 96%   BMI 29.29 kg/m  Nursing note and vital signs reviewed.  Physical Exam     General/constitutional: no distress, pleasant HEENT: Normocephalic, PER, Conj Clear, EOMI, Oropharynx clear Neck supple CV: rrr no mrg Lungs: clear to auscultation, normal respiratory effort Abd: Soft, Nontender Ext: no edema Skin/msk: healed right 2nd toe disarticulating incision at mtp joint. Lateral malleolus wound about 2 cm with fibrinous exudate; no pus   Labs: Reviewed opat labs  Micro:  Serology:  Imaging:  Assessment & Plan:   Problem List Items Addressed This Visit   None Visit Diagnoses     Diabetic foot infection ([place])    -  Primary   Relevant Orders   C-reactive protein        No orders of the defined types were placed in this encounter.  Diagnosis: Bilateral diabetic foot ulcer infection with om S/p left tma 1/18 Procedure: 1/15 Right foot: Excisional debridement of ulceration to level of bone, 2.5 x 2.5 x 0.4 cm, R ankle Bone biopsy distal fibula, right ankle Amputation of 2nd toe at metatarsal phalangeal joint level, Right foot   Left foot: Partial 2nd ray amputation, left foot 3rd metatarsal head bone biopsy, left foot 1st metatarsal head bone biopsy, left foot   Culture Result: left foot PsA (S cefepime, cipro); mssa  Continue cefepime till 2/29 Picc can be removed then  F/u 1 month from now  01/31/23 id assessment He finished cefepime for presumed mssa/cefepime right foot dm om 1 month out he continues to do  well Lateral malleolus wound doesn't apear infected Will f/u 3 months to finish monitoring for relapse   ---------- Bp a little soft but assymptomatic. Suspect spurious. Repeat show 99/66. Cbg was 160s. Assymptomatic  Advise monitoring and let us know if symptomatic  Follow-up: Return in about 3 months (around 05/03/2023).    I have spent a total of 30 minutes of face-to-face and non-face-to-face time, excluding clinical staff time, preparing to see patient, ordering tests and/or medications, and provide counseling the patient     Jabier Mutton, [place] for [facility] 01/31/2023, 2:29 PM

## 2023-01-31 NOTE — Addendum Note (Signed)
Addended by: Lucie Leather D on: 01/31/2023 02:54 PM   Modules accepted: Orders

## 2023-02-01 ENCOUNTER — Other Ambulatory Visit (HOSPITAL_COMMUNITY): Payer: Self-pay

## 2023-02-01 LAB — C-REACTIVE PROTEIN: CRP: 3 mg/L (ref <8.0)

## 2023-02-06 ENCOUNTER — Ambulatory Visit: Payer: Medicaid Other | Admitting: Student

## 2023-02-06 ENCOUNTER — Telehealth: Payer: Self-pay | Admitting: *Deleted

## 2023-02-06 VITALS — BP 94/71 | HR 108 | Temp 98.5°F | Ht 71.0 in | Wt 217.1 lb

## 2023-02-06 DIAGNOSIS — D649 Anemia, unspecified: Secondary | ICD-10-CM

## 2023-02-06 DIAGNOSIS — E119 Type 2 diabetes mellitus without complications: Secondary | ICD-10-CM

## 2023-02-06 DIAGNOSIS — Z7984 Long term (current) use of oral hypoglycemic drugs: Secondary | ICD-10-CM

## 2023-02-06 DIAGNOSIS — I959 Hypotension, unspecified: Secondary | ICD-10-CM | POA: Diagnosis not present

## 2023-02-06 DIAGNOSIS — N179 Acute kidney failure, unspecified: Secondary | ICD-10-CM

## 2023-02-06 DIAGNOSIS — R112 Nausea with vomiting, unspecified: Secondary | ICD-10-CM | POA: Diagnosis not present

## 2023-02-06 DIAGNOSIS — E1159 Type 2 diabetes mellitus with other circulatory complications: Secondary | ICD-10-CM

## 2023-02-06 LAB — POCT GLYCOSYLATED HEMOGLOBIN (HGB A1C): Hemoglobin A1C: 6 % — AB (ref 4.0–5.6)

## 2023-02-06 LAB — GLUCOSE, CAPILLARY: Glucose-Capillary: 186 mg/dL — ABNORMAL HIGH (ref 70–99)

## 2023-02-06 MED ORDER — FREESTYLE LIBRE 2 READER DEVI: 1.0000 | Freq: Every day | 0 refills | Status: DC

## 2023-02-06 MED ORDER — FREESTYLE LIBRE 2 SENSOR MISC: 1 refills | Status: DC

## 2023-02-06 NOTE — Telephone Encounter (Signed)
Received call from Genevive Bi, RN with [facility] reporting low BPs at last evening's home visit. States BP sitting 100/68 and standing 82/64. Patient denied dizziness with standing yesterday but does endorse intermittent dizziness with position changes. PCP made aware.

## 2023-02-06 NOTE — Patient Instructions (Signed)
Thank you, Carl Conley for allowing [place] to provide your care today. Today we discussed .  Diabetes Your A1c looks great today. I would like to start having you check your blood sugar levels. I have ordered a continuous glucose monitor for you to wear and use regularly. Please check your levels multiple times a day. Continue the invokamet  Low blood pressures Please continue to monitor you blood pressures at home. I recommend you purchase a blood pressure cuff.  If you feel lightheaded or dizzy I would check your blood pressure and call our clinic.   Your foot wounds are healing well, continue to follow with wound care and infectious disease. Please continue to wear socks whenever you are wearing shoes.   I have ordered the following labs for you:   Lab Orders         Glucose, capillary         Basic metabolic panel         Urinalysis, Complete (81001)         CBC no Diff         POC Hbg A1C       Referrals ordered today:   Referral Orders  No referral(s) requested today     I have ordered the following medication/changed the following medications:   Stop the following medications: There are no discontinued medications.   Start the following medications: Meds ordered this encounter  Medications   Continuous Blood Gluc Receiver (FREESTYLE LIBRE 2 READER) DEVI    Sig: 1 Device by Does not apply route daily.    Dispense:  1 each    Refill:  0   Continuous Blood Gluc Sensor (FREESTYLE LIBRE 2 SENSOR) MISC    Sig: Please replace sensor every 14 days    Dispense:  9 each    Refill:  1     Follow up:  1 month follow up     Should you have any questions or concerns please call the internal medicine clinic at [phone].    Sanjuana Letters, D.O. [place]

## 2023-02-07 ENCOUNTER — Ambulatory Visit (INDEPENDENT_AMBULATORY_CARE_PROVIDER_SITE_OTHER): Payer: Medicaid Other | Admitting: Podiatry

## 2023-02-07 DIAGNOSIS — E1169 Type 2 diabetes mellitus with other specified complication: Secondary | ICD-10-CM

## 2023-02-07 DIAGNOSIS — M869 Osteomyelitis, unspecified: Secondary | ICD-10-CM

## 2023-02-07 DIAGNOSIS — D649 Anemia, unspecified: Secondary | ICD-10-CM | POA: Insufficient documentation

## 2023-02-07 DIAGNOSIS — N179 Acute kidney failure, unspecified: Secondary | ICD-10-CM | POA: Insufficient documentation

## 2023-02-07 DIAGNOSIS — Z9889 Other specified postprocedural states: Secondary | ICD-10-CM

## 2023-02-07 DIAGNOSIS — E1142 Type 2 diabetes mellitus with diabetic polyneuropathy: Secondary | ICD-10-CM

## 2023-02-07 DIAGNOSIS — L97314 Non-pressure chronic ulcer of right ankle with necrosis of bone: Secondary | ICD-10-CM

## 2023-02-07 LAB — BASIC METABOLIC PANEL
BUN/Creatinine Ratio: 14 (ref 9–20)
BUN: 28 mg/dL — ABNORMAL HIGH (ref 6–20)
CO2: 17 mmol/L — ABNORMAL LOW (ref 20–29)
Calcium: 9.8 mg/dL (ref 8.7–10.2)
Chloride: 101 mmol/L (ref 96–106)
Creatinine, Ser: 2.03 mg/dL — ABNORMAL HIGH (ref 0.76–1.27)
Glucose: 163 mg/dL — ABNORMAL HIGH (ref 70–99)
Potassium: 4.7 mmol/L (ref 3.5–5.2)
Sodium: 139 mmol/L (ref 134–144)
eGFR: 43 mL/min/{1.73_m2} — ABNORMAL LOW (ref >59)

## 2023-02-07 LAB — URINALYSIS, COMPLETE
Bilirubin, UA: NEGATIVE
Ketones, UA: NEGATIVE
Leukocytes,UA: NEGATIVE
Nitrite, UA: NEGATIVE
Protein,UA: NEGATIVE
RBC, UA: NEGATIVE
Specific Gravity, UA: 1.022 (ref 1.005–1.030)
Urobilinogen, Ur: 0.2 mg/dL (ref 0.2–1.0)
pH, UA: 5.5 (ref 5.0–7.5)

## 2023-02-07 LAB — CBC
Hematocrit: 40.2 % (ref 37.5–51.0)
Hemoglobin: 13.6 g/dL (ref 13.0–17.7)
MCH: 27 pg (ref 26.6–33.0)
MCHC: 33.8 g/dL (ref 31.5–35.7)
MCV: 80 fL (ref 79–97)
Platelets: 205 10*3/uL (ref 150–450)
RBC: 5.03 x10E6/uL (ref 4.14–5.80)
RDW: 15.7 % — ABNORMAL HIGH (ref 11.6–15.4)
WBC: 9.7 10*3/uL (ref 3.4–10.8)

## 2023-02-07 LAB — MICROSCOPIC EXAMINATION
Bacteria, UA: NONE SEEN
Casts: NONE SEEN /lpf
Epithelial Cells (non renal): NONE SEEN /hpf (ref 0–10)
RBC, Urine: NONE SEEN /hpf (ref 0–2)
WBC, UA: NONE SEEN /hpf (ref 0–5)

## 2023-02-07 MED ORDER — METFORMIN HCL ER 500 MG PO TB24: 2000.0000 mg | ORAL_TABLET | Freq: Every day | ORAL | 3 refills | Status: DC

## 2023-02-07 NOTE — Progress Notes (Signed)
Subjective:  Patient ID: Carl Conley, male    DOB: 04-Oct-1986,  MRN: [id number]  Chief Complaint  Patient presents with   Wound Check    Patient states 0/10 pain. Minor drainage. No f/c/n/n    DOS: November 19, 2022 Procedure: Right foot second toe amputation at MPJ level and right lateral ankle ulcer debridement with bone biopsy of the distal fibula.  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  37 y.o. male returns for f follow-up of right lateral ankle ulceration.  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  He has healed his amputation sites without issue.  No concerns at this time.  His only problem now is the right lateral ankle wound which does appear to be improving per the patient he is doing dressing care and has a wound care appointment set up for tomorrow.  Review of Systems: Negative except as noted in the HPI. Denies N/V/F/Ch.   Objective:  There were no vitals filed for this visit. There is no height or weight on file to calculate BMI. Constitutional Well developed. Well nourished.  Vascular Foot warm and well perfused. Capillary refill normal to all digits.  Calf is soft and supple, no posterior calf or knee pain, negative Homans' sign  Neurologic Normal speech. Oriented to person, place, and time. Epicritic sensation to light touch grossly present bilaterally.  Dermatologic Left foot transmetatarsal amputation site and right foot second toe amputation site fully healed with healthy skin and minimal to no callus or eschar formation Attention to the right lateral ankle there is an ulceration present with fibrotic tissue at the central base and healthy tissue surrounding.  Upon debridement of the wound there was no probe to bone the wound measured approximately 1 x 1 x 0.2 cm postdebridement.  There was good healthy bleeding no purulent drainage no maceration or erythema  surrounding the wound       Orthopedic: No tenderness to palpation noted about the surgical site.   Multiple view plain film radiographs: Deferred at this visit Assessment:   1. Postoperative state   2. 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 Diabetic osteomyelitis   4. DM type 2 with diabetic peripheral neuropathy      Plan:  Patient was evaluated and treated and all questions answered.  S/p foot surgery bilaterally, right foot lateral ankle ulceration debridement and bone biopsy as well as second toe amputation MPJ level.  Left foot transmetatarsal amputation and tendo Achilles lengthening -Progressing as expected post-operatively.  Patient is beginning to heal his right lateral ankle ulcer which does appear to be improving overall. -.  Ulcer as below -XR: Deferred at this visit -WB Status: Weightbearing as tolerated in regular shoes -Medications: No indication for antibiotics -For the right ankle ulceration the patient has my approval and encouragement to continue proceeding with wound care center visits for the right lateral ankle ulceration for ongoing and continuous weekly wound care and debridements.  Appreciate assistance with wound care per wound care center -At time being the patient is using Aquacel Ag's layer applied with gauze and gauze wrap every 2 to 3 days.  I want him to continue with this dressing change on that frequency as he is currently doing as the wound does appear to be improving  Procedure: Excisional Debridement of Wound Rationale: Removal of non-viable soft tissue from the wound to promote healing.  Anesthesia: none Post-Debridement  Wound Measurements: 1 cm x 1 cm x 0.2 cm  Type of Debridement: Sharp Excisional Tissue Removed: Non-viable soft tissue Depth of Debridement: subcutaneous tissue. Technique: Sharp excisional debridement to bleeding, viable wound base.  Dressing: Dry, sterile, compression dressing. Disposition: Patient  tolerated procedure well.   Return in about 3 weeks (around 02/28/2023) for f/u R lateral ankle ulcer.             Corinna Gab, DPM [facility] / [facility]

## 2023-02-07 NOTE — Assessment & Plan Note (Signed)
A1c of 6.0% from 6.5%. Current regimen of invokamet. His invokamet was decreased to once daily as thought to be cause of his nausea and vomiting. Since doing this, his symptoms have resolved. However, today on lab work he does have a new AKI w/ a creatinine of 2.0.   Likely secondary to the combination of persistently low blood pressures, SLGT-2i and vomiting. Will just prescribe metformin and monitor renal function. With low blood pressures SGLT-2i may be over diuresing him leading to renal dysfunction. Will also prescribe CGM to determine if having significantly elevated glucose levels despite A1c at range.

## 2023-02-07 NOTE — Assessment & Plan Note (Signed)
Nausea and vomiting have resolved since decreasing invokamet to once daily. He notes symptoms have resolved a few weeks ago. Will continue to monitor for recurrent symptoms. Labs today without evidence of euglycemic DKA.

## 2023-02-07 NOTE — Assessment & Plan Note (Signed)
Anemia noted after recent significant lower extremity wounds and surgical interventions. Since completing abx, anemia has resolved.

## 2023-02-07 NOTE — Assessment & Plan Note (Signed)
Presented today for follow up after having persistent nausea and vomiting. This resolved, however, lab work today reveals creatinine of 2.0. Baseline has been under 1.0. He denies changes in urinary habits, fever, chills back pain. He feels he is urinating the same amount. UA unremarkable, no evidence of casts. Electrolytes stable.   Likely pre-renal with chronically low blood pressures, vomiting, and SGLT-2i. Vomiting has subsided and we will discontinue SGLT-2i for time being. Have asked him to continue to stay hydrated and follow up early next week for repeat BMP. If persistence, will order renal US.

## 2023-02-07 NOTE — Assessment & Plan Note (Addendum)
Continues to have low blood pressure readings likely autonomic dysfunction from history of diabetes. Orthostatics negative. Denies lightheadedness or dizziness. He is asymptomatic and had low blood pressure readings for some time. Once his lower extremity wounds heal can consider compression stockings. Discontinuing SGLT-2i today with AKI in setting of nausea/vomiting/diarrhea which should help increase his blood pressures.

## 2023-02-07 NOTE — Progress Notes (Signed)
CC: follow up nausea/vomiting  HPI:  Carl Conley is a 37 y.o. male living with a history stated below and presents today for follow up of his nausea/vomiting. Please see problem based assessment and plan for additional details.  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   Depression 10/31/2016   Diabetes mellitus ([place])    dx date 2004 at age 80   Low back pain 09/01/2016   Visual snow syndrome     Current Outpatient Medications on File Prior to Visit  Medication Sig Dispense Refill   Accu-Chek Softclix Lancets lancets use to test blood sugar four times daily (Patient not taking: Reported on 01/31/2023) 100 each 0   acyclovir (ZOVIRAX) 400 MG tablet Take 400 mg by mouth 2 (two) times daily.     Bacitracin-Polymyxin B (NEOSPORIN EX) Apply 1 Application topically daily as needed (antibiotic ointment). (Patient not taking: Reported on 12/18/2022)     blood glucose meter kit and supplies KIT Use up to four times daily as directed. (Patient not taking: Reported on 01/31/2023) 1 each 0   citalopram (CELEXA) 20 MG tablet TAKE 1 TABLET BY MOUTH EVERY DAY (Patient taking differently: Take 20 mg by mouth daily.) 90 tablet 2   glucose blood test strip use to test blood sugar four times daily (Patient not taking: Reported on 01/31/2023) 100 each 0   rosuvastatin (CRESTOR) 20 MG tablet Take 1 tablet (20 mg total) by mouth daily. (Patient not taking: Reported on 01/31/2023) 90 tablet 1   timolol (TIMOPTIC) 0.5 % ophthalmic solution Place 1 drop into the right eye every morning.     VITAMIN D PO Take 1 tablet by mouth daily. (Patient not taking: Reported on 01/31/2023)     No current facility-administered medications on file prior to visit.    Review of Systems: ROS negative except for what is noted on the assessment and plan.  Vitals:   02/06/23 1337 02/06/23 1405 02/06/23 1406 02/06/23 1408  BP: (!) 89/69 98/69 98/70  94/71  Pulse: 97 84 98 (!)  108  Temp: 98.5 F (36.9 C)     TempSrc: Oral     SpO2: 99%     Weight: 217 lb 1.6 oz (98.5 kg)     Height: 5\' 11"  (1.803 m)       Physical Exam: Constitutional: well-appearing, in no acute distress HENT: normocephalic atraumatic Eyes: conjunctiva non-erythematous Neck: supple Cardiovascular: regular rate and rhythm, no m/r/g Pulmonary/Chest: normal work of breathing on room air MSK: normal bulk and tone Neurological: alert & oriented x 3 Skin: warm and dry Psych: normal mood  Assessment & Plan:   Low blood pressure Continues to have low blood pressure readings likely autonomic dysfunction from history of diabetes. Orthostatics negative. Denies lightheadedness or dizziness. He is asymptomatic and had low blood pressure readings for some time. Once his lower extremity wounds heal can consider compression stockings. Discontinuing SGLT-2i today with AKI in setting of nausea/vomiting/diarrhea which should help increase his blood pressures.   Nausea and vomiting Nausea and vomiting have resolved since decreasing invokamet to once daily. He notes symptoms have resolved a few weeks ago. Will continue to monitor for recurrent symptoms. Labs today without evidence of euglycemic DKA.   Diabetes ([place]) A1c of 6.0% from 6.5%. Current regimen of invokamet. His invokamet was decreased to once daily as thought to be cause of his nausea and vomiting. Since doing this, his symptoms have resolved.  However, today on lab work he does have a new AKI w/ a creatinine of 2.0.   Likely secondary to the combination of persistently low blood pressures, SLGT-2i and vomiting. Will just prescribe metformin and monitor renal function. With low blood pressures SGLT-2i may be over diuresing him leading to renal dysfunction. Will also prescribe CGM to determine if having significantly elevated glucose levels despite A1c at range.   Acute kidney injury Presented today for follow up after having persistent nausea and  vomiting. This resolved, however, lab work today reveals creatinine of 2.0. Baseline has been under 1.0. He denies changes in urinary habits, fever, chills back pain. He feels he is urinating the same amount. UA unremarkable, no evidence of casts. Electrolytes stable.   Likely pre-renal with chronically low blood pressures, vomiting, and SGLT-2i. Vomiting has subsided and we will discontinue SGLT-2i for time being. Have asked him to continue to stay hydrated and follow up early next week for repeat BMP. If persistence, will order renal US.   Patient discussed with Dr. Donnita Falls, D.O. [facility], PGY-3 Phone: [phone] Date 02/07/2023 Time 7:32 PM

## 2023-02-08 ENCOUNTER — Ambulatory Visit (HOSPITAL_BASED_OUTPATIENT_CLINIC_OR_DEPARTMENT_OTHER): Payer: Medicaid Other | Admitting: General Surgery

## 2023-02-13 NOTE — Progress Notes (Signed)
Internal Medicine Clinic Attending  Case discussed with Dr. Katsadouros  At the time of the visit.  We reviewed the resident's history and exam and pertinent patient test results.  I agree with the assessment, diagnosis, and plan of care documented in the resident's note.  

## 2023-02-18 ENCOUNTER — Encounter (HOSPITAL_BASED_OUTPATIENT_CLINIC_OR_DEPARTMENT_OTHER): Payer: Medicaid Other | Attending: General Surgery | Admitting: General Surgery

## 2023-02-18 ENCOUNTER — Telehealth: Payer: Self-pay

## 2023-02-18 DIAGNOSIS — L97314 Non-pressure chronic ulcer of right ankle with necrosis of bone: Secondary | ICD-10-CM | POA: Insufficient documentation

## 2023-02-18 DIAGNOSIS — M869 Osteomyelitis, unspecified: Secondary | ICD-10-CM | POA: Diagnosis not present

## 2023-02-18 DIAGNOSIS — E11622 Type 2 diabetes mellitus with other skin ulcer: Secondary | ICD-10-CM | POA: Insufficient documentation

## 2023-02-18 DIAGNOSIS — Z09 Encounter for follow-up examination after completed treatment for conditions other than malignant neoplasm: Secondary | ICD-10-CM | POA: Diagnosis not present

## 2023-02-18 DIAGNOSIS — Z89421 Acquired absence of other right toe(s): Secondary | ICD-10-CM | POA: Diagnosis not present

## 2023-02-18 DIAGNOSIS — Z89432 Acquired absence of left foot: Secondary | ICD-10-CM | POA: Insufficient documentation

## 2023-02-18 DIAGNOSIS — E1140 Type 2 diabetes mellitus with diabetic neuropathy, unspecified: Secondary | ICD-10-CM | POA: Insufficient documentation

## 2023-02-18 NOTE — Telephone Encounter (Addendum)
Decision:Approved [name] (Key: [facility]) PA Case ID #: [id number] Rx #: [id number] Need Help? Call us at [phone] Outcome Approved today Request Reference Number: [id number]. FREESTY LIBR KIT 2 SENSOR is approved through 08/20/2023. For further questions, call [employer] at [phone]. Authorization Expiration Date: 08/20/2023 Drug FreeStyle Libre 2 Sensor ePA cloud logo Form [facility] [place] Electronic Prior Authorization Form [phone] NCPDP) Original Claim Info 75 PA Req, Dr submit ePA at [email or web address] MD Call [phone], Possible 3 DSSUBMIT 03 Lvl of Service PA# 72 PA TYPE8Drug Requires Prior Authorization  Pharmacy is aware of approval

## 2023-02-18 NOTE — Telephone Encounter (Signed)
Prior Authorization for patient [facility] 2 reader) came through on cover my meds was submitted with last office notes and labs awaiting approval or denial

## 2023-02-18 NOTE — Telephone Encounter (Signed)
Prior Authorization for patient [facility] 2 sensor) came through on cover my meds was submitted with last office notes and labs awaiting approval or denial

## 2023-02-18 NOTE — Telephone Encounter (Signed)
Decision:Approved Carl Conley (Carl Conley) PA Case ID #: [id number] Rx #: [id number] Need Help? Call us at [phone] Archived today Outcome N/A We received a prior authorization request for the member and product listed above. The Community and [facility] Prior Authorization Team is not able to review this request because the requested medication has been previously approved under [id number]. Based on the information reviewed, the requested prescription is currently authorized for coverage by the plan until 2023-08-20. Please resubmit this request within 30 days of authorization expiration date. Drug FreeStyle Libre 2 Reader device ePA cloud logo Form [employer] [employer] Electronic Prior Authorization Form [phone] NCPDP) Original Claim Info 75 PA Req, Dr submit ePA at [employer]comOr MD Call [phone], Possible 3 DSSUBMIT 03 Lvl of Service PA# 72 PA TYPE8Drug Requires Prior Authorization  PRESCRIBER INSTRUCTIONS  Pharmacy is aware of approval

## 2023-02-19 ENCOUNTER — Other Ambulatory Visit: Payer: Medicaid Other

## 2023-02-19 ENCOUNTER — Other Ambulatory Visit: Payer: Self-pay | Admitting: Student

## 2023-02-19 DIAGNOSIS — N179 Acute kidney failure, unspecified: Secondary | ICD-10-CM

## 2023-02-20 LAB — BMP8+ANION GAP
Anion Gap: 16 mmol/L (ref 10.0–18.0)
BUN/Creatinine Ratio: 22 — ABNORMAL HIGH (ref 9–20)
BUN: 35 mg/dL — ABNORMAL HIGH (ref 6–20)
CO2: 15 mmol/L — ABNORMAL LOW (ref 20–29)
Calcium: 9.4 mg/dL (ref 8.7–10.2)
Chloride: 107 mmol/L — ABNORMAL HIGH (ref 96–106)
Creatinine, Ser: 1.58 mg/dL — ABNORMAL HIGH (ref 0.76–1.27)
Glucose: 290 mg/dL — ABNORMAL HIGH (ref 70–99)
Potassium: 4.7 mmol/L (ref 3.5–5.2)
Sodium: 138 mmol/L (ref 134–144)
eGFR: 57 mL/min/{1.73_m2} — ABNORMAL LOW (ref >59)

## 2023-02-21 NOTE — Progress Notes (Signed)
Carl, Conley ([id number]) [id number] Page 1 of 8 Visit Report for 02/18/2023 Arrival Information Details Patient Name: Date of Service: Carl Conley, Carl Conley IN S. 02/18/2023 2:45 PM Medical Record Number: [id number] Patient Account Number: [id number] Date of Birth/Sex: Treating RN: [birth year] (37 y.o. Carl Conley Primary Care Caridad Silveira: Belva Agee Other Clinician: Referring Aerie Donica: Treating Fenris Cauble/Extender: Kirkland Hun, Vasilios Weeks in Treatment: 0 Visit Information History Since Last Visit All ordered tests and consults were completed: Yes Patient Arrived: Ambulatory Added or deleted any medications: No Arrival Time: 14:58 Any new allergies or adverse reactions: No Accompanied By: self Had a fall or experienced change in No Transfer Assistance: None activities of daily living that may affect Patient Identification Verified: Yes risk of falls: Secondary Verification Process Completed: Yes Signs or symptoms of abuse/neglect since last visito No Patient Requires Transmission-Based Precautions: No Hospitalized since last visit: No Patient Has Alerts: No Implantable device outside of the clinic excluding No cellular tissue based products placed in the center since last visit: Pain Present Now: No Electronic Signature(s) Signed: 02/21/2023 1:56:54 PM By: Brenton Grills Entered By: Brenton Grills on 02/18/2023 14:58:55 -------------------------------------------------------------------------------- Clinic Level of Care Assessment Details Patient Name: Date of Service: Carl Conley, Carl IN S. 02/18/2023 2:45 PM Medical Record Number: [id number] Patient Account Number: [id number] Date of Birth/Sex: Treating RN: [birth year] (37 y.o. Carl Conley Primary Care Aaryana Betke: Belva Agee Other Clinician: Referring Gaylen Pereira: Treating Hayslee Casebolt/Extender: Kirkland Hun, Vasilios Weeks in Treatment: 0 Clinic  Level of Care Assessment Items TOOL 3 Quantity Score X- 1 0 Use when EandM and Procedure is performed on FOLLOW-UP visit ASSESSMENTS - Nursing Assessment / Reassessment X- 1 10 Reassessment of Co-morbidities (includes updates in patient status) X- 1 5 Reassessment of Adherence to Treatment Plan ASSESSMENTS - Wound and Skin Assessment / Reassessment []  - Points for Wound Assessment can only be taken for a new wound of unknown or different etiology and a procedure is 0 NOT performed to that wound X- 1 5 Simple Wound Assessment / Reassessment - one wound []  - 0 Complex Wound Assessment / Reassessment - multiple wounds X- 1 10 Dermatologic / Skin Assessment (not related to wound area) ASSESSMENTS - Focused Assessment []  - 0 Circumferential Edema Measurements - multi extremities []  - 0 Nutritional Assessment / Counseling / Intervention X- 1 5 Lower Extremity Assessment (monofilament, tuning fork, pulses) []  - 0 Peripheral Arterial Disease Assessment (using hand held doppler) ASSESSMENTS - Ostomy and/or Continence Assessment and Care []  - 0 Incontinence Assessment and Management []  - 0 Ostomy Care Assessment and Management (repouching, etc.) Carl Conley, Carl Conley ([id number]) [id number] Page 2 of 8 PROCESS - Coordination of Care []  - Points for Discharge Coordination can only be taken for a new wound of unknown or different etiology and a procedure 0 is NOT performed to that wound X- 1 15 Simple Patient / Family Education for ongoing care []  - 0 Complex (extensive) Patient / Family Education for ongoing care X- 1 10 Staff obtains Chiropractor, Records, T Results / Process Orders est []  - 0 Staff telephones HHA, Nursing Homes / Clarify orders / etc []  - 0 Routine Transfer to another Facility (non-emergent condition) []  - 0 Routine Hospital Admission (non-emergent condition) []  - 0 New Admissions / Manufacturing engineer / Ordering NPWT Apligraf,  etc. , []  - 0 Emergency Hospital Admission (emergent condition) X- 1 10 Simple Discharge Coordination []  - 0 Complex (extensive) Discharge Coordination PROCESS - Special Needs []  - 0 Pediatric / Minor Patient Management []  -  0 Isolation Patient Management  - 0 Hearing / Language / Visual special needs  - 0 Assessment of Community assistance (transportation, D/C planning, etc.)  - 0 Additional assistance / Altered mentation  - 0 Support Surface(s) Assessment (bed, cushion, seat, etc.) INTERVENTIONS - Wound Cleansing / Measurement  - Points for Wound Cleaning / Measurement, Wound Dressing, Specimen Collection and Specimen taken to lab can only 0 be taken for a new wound of unknown or different etiology and a procedure is NOT performed to that wound X- 1 5 Simple Wound Cleansing - one wound  - 0 Complex Wound Cleansing - multiple wounds  - 0 Wound Imaging (photographs - any number of wounds)  - 0 Wound Tracing (instead of photographs) X- 1 5 Simple Wound Measurement - one wound  - 0 Complex Wound Measurement - multiple wounds INTERVENTIONS - Wound Dressings X - Small Wound Dressing one or multiple wounds 1 10  - 0 Medium Wound Dressing one or multiple wounds  - 0 Large Wound Dressing one or multiple wounds INTERVENTIONS - Miscellaneous  - 0 External ear exam  - 0 Specimen Collection (cultures, biopsies, blood, body fluids, etc.)  - 0 Specimen(s) / Culture(s) sent or taken to Lab for analysis  - 0 Patient Transfer (multiple staff / Nurse, adult / Similar devices)  - 0 Simple Staple / Suture removal (25 or less)  - 0 Complex Staple / Suture removal (26 or more)  - 0 Hypo / Hyperglycemic Management (close monitor of Blood Glucose)  - 0 Ankle / Brachial Index (ABI) - do not check if billed separately X- 1 5 Vital Signs Has the patient been seen at the hospital within the last three years: Yes Total Score: 95 Level Of Care:  New/Established - Level [address] Carl Conley, Carl Conley ([id number]) [id number] Page 3 of 8 Electronic Signature(s) Signed: 02/21/2023 1:56:54 PM By: Brenton Grills Entered By: Brenton Grills on 02/18/2023 15:30:06 -------------------------------------------------------------------------------- Encounter Discharge Information Details Patient Name: Date of Service: Carl Conley IN S. 02/18/2023 2:45 PM Medical Record Number: [id number] Patient Account Number: [id number] Date of Birth/Sex: Treating RN: [birth year] (37 y.o. Carl Conley Primary Care Katheen Aslin: Belva Agee Other Clinician: Referring Julena Barbour: Treating Adreyan Carbajal/Extender: Kirkland Hun, Vasilios Weeks in Treatment: 0 Encounter Discharge Information Items Post Procedure Vitals Discharge Condition: Stable Temperature (F): 98.6 Ambulatory Status: Ambulatory Pulse (bpm): 80 Discharge Destination: Home Respiratory Rate (breaths/min): 18 Transportation: Other Blood Pressure (mmHg): 118/62 Accompanied By: self Schedule Follow-up Appointment: Yes Clinical Summary of Care: Patient Declined Electronic Signature(s) Signed: 02/21/2023 1:56:54 PM By: Brenton Grills Entered By: Brenton Grills on 02/18/2023 15:33:23 -------------------------------------------------------------------------------- Lower Extremity Assessment Details Patient Name: Date of Service: Carl Conley, Carl Conley IN S. 02/18/2023 2:45 PM Medical Record Number: [id number] Patient Account Number: [id number] Date of Birth/Sex: Treating RN: June 14, 1986 (37 y.o. Carl Conley Primary Care Perian Tedder: Belva Agee Other Clinician: Referring Alaa Eyerman: Treating Cait Locust/Extender: Kirkland Hun, Vasilios Weeks in Treatment: 0 Edema Assessment Assessed: [Left: No] [Right: No] Edema: [Left: N] [Right: o] Vascular Assessment Pulses: Dorsalis Pedis Palpable: [Right:Yes] Electronic Signature(s) Signed: 02/21/2023  1:56:54 PM By: Brenton Grills Entered By: Brenton Grills on 02/18/2023 15:13:28 -------------------------------------------------------------------------------- Multi Wound Chart Details Patient Name: Date of Service: Carl Conley IN S. 02/18/2023 2:45 PM Medical Record Number: [id number] Patient Account Number: [id number] Date of Birth/Sex: Treating RN: Mar 05, 1986 (37 y.o. M) Primary Care Yeiren Whitecotton: Belva Agee Other Clinician: Referring Yohannes Waibel: Treating Cylan Borum/Extender: Kirkland Hun, Vasilios Weeks in Treatment: 0 Vital Signs Height(in): 71 Pulse(bpm): 87 Weight(lbs): 185 Blood Pressure(mmHg): 111/77  EEAN, BUSS ([id number]) [id number] Page 4 of 8 Body Mass Index(BMI): 25.8 Temperature(F): 99 Respiratory Rate(breaths/min): 18 [2:Photos: No Photos Right, Lateral Malleolus Wound Location: Pressure Injury Wounding Event: Diabetic Wound/Ulcer of the Lower Primary Etiology: Extremity Cataracts, Type II Diabetes, Comorbid History: Osteomyelitis, Neuropathy 05/05/2022 Date Acquired: 0 Weeks of  Treatment: Open Wound Status: No Wound Recurrence: 1x0.9x0.1 Measurements L x W x D (cm) 0.707 A (cm) : rea 0.071 Volume (cm) : Grade 2 Classification: Medium Exudate A mount: Serosanguineous Exudate Type: red, brown Exudate Color: Distinct, outline  attached Wound Margin: Medium (34-66%) Granulation A mount: Red, Pink Granulation Quality: None Present (0%) Necrotic A mount: Fat Layer (Subcutaneous Tissue): Yes N/A Exposed Structures: Medium (34-66%) Epithelialization: Debridement - Selective/Open  Wound N/A Debridement: Pre-procedure Verification/Time Out 15:17 Taken: Lidocaine 4% T opical Solution Pain Control: Slough Tissue Debrided: Non-Viable Tissue Level: 0.9 Debridement A (sq cm): rea Curette Instrument: Minimum Bleeding: Silver Nitrate  Hemostasis A chieved: Procedure was tolerated well Debridement Treatment Response: 1x0.9x0.1 Post  Debridement Measurements L x W x D (cm) 0.071 Post Debridement Volume: (cm) No Abnormalities Noted Periwound Skin Texture: Maceration: No Periwound Skin  Moisture: Dry/Scaly: No No Abnormalities Noted Periwound Skin Color: Debridement Procedures Performed:] [N/A:N/A N/A N/A N/A N/A N/A N/A N/A N/A N/A N/A N/A N/A N/A N/A N/A N/A N/A N/A N/A N/A N/A N/A N/A N/A N/A N/A N/A N/A N/A N/A N/A N/A N/A N/A N/A] Treatment Notes Wound #2 (Malleolus) Wound Laterality: Right, Lateral Cleanser Peri-Wound Care Topical Primary Dressing Maxorb Extra Ag+ Alginate Dressing, 2x2 (in/in) Discharge Instruction: Apply to wound bed as instructed Secondary Dressing Zetuvit Plus Silicone Border Dressing 4x4 (in/in) Discharge Instruction: Apply silicone border over primary dressing as directed. Secured With Compression Wrap Compression Stockings Facilities manager) Signed: 02/18/2023 3:38:38 PM By: Duanne Guess MD FACS Entered By: Duanne Guess on 02/18/2023 15:38:37 [name] ([id number]) [id number] Page 5 of 8 -------------------------------------------------------------------------------- Multi-Disciplinary Care Plan Details Patient Name: Date of Service: Carl Conley, Carl IN S. 02/18/2023 2:45 PM Medical Record Number: [id number] Patient Account Number: [id number] Date of Birth/Sex: Treating RN: 08-08-1986 (37 y.o. Carl Conley Primary Care Ilma Achee: Belva Agee Other Clinician: Referring Killian Ress: Treating Angello Chien/Extender: Kirkland Hun, Vasilios Weeks in Treatment: 0 Active Inactive Pressure Nursing Diagnoses: Knowledge deficit related to causes and risk factors for pressure ulcer development Goals: Patient will remain free from development of additional pressure ulcers Date Initiated: 02/18/2023 Target Resolution Date: 03/27/2023 Goal Status: Active Interventions: Provide education on pressure  ulcers Notes: Wound/Skin Impairment Nursing Diagnoses: Impaired tissue integrity Goals: Patient/caregiver will verbalize understanding of skin care regimen Date Initiated: 02/18/2023 Target Resolution Date: 02/25/2023 Goal Status: Active Interventions: Assess patient/caregiver ability to obtain necessary supplies Assess patient/caregiver ability to perform ulcer/skin care regimen upon admission and as needed Assess ulceration(s) every visit Provide education on ulcer and skin care Treatment Activities: Patient referred to home care : 02/18/2023 Skin care regimen initiated : 02/18/2023 Topical wound management initiated : 02/18/2023 Notes: Electronic Signature(s) Signed: 02/21/2023 1:56:54 PM By: Brenton Grills Entered By: Brenton Grills on 02/18/2023 15:25:50 -------------------------------------------------------------------------------- Pain Assessment Details Patient Name: Date of Service: Carl Conley, Carl Conley IN S. 02/18/2023 2:45 PM Medical Record Number: [id number] Patient Account Number: [id number] Date of Birth/Sex: Treating RN: [birth year] (37 y.o. Carl Conley Primary Care Carianna Lague: Belva Agee Other Clinician: Referring Keevin Panebianco: Treating Haruka Kowaleski/Extender: Kirkland Hun, Vasilios Weeks in Treatment: 0 Active Problems Location of Pain Severity and Description of Pain Patient Has Paino No Site Locations Carl Conley, Carl Conley ([id number])  [phone].pdf Page 6 of 8 Pain Management and Medication Current Pain Management: Electronic Signature(s) Signed: 02/21/2023 1:56:54 PM By: Brenton Grills Entered By: Brenton Grills on 02/18/2023 15:00:22 -------------------------------------------------------------------------------- Patient/Caregiver Education Details Patient Name: Date of Service: Carl Conley 4/15/2024andnbsp2:45 PM Medical Record Number: [id number] Patient Account Number: [id number] Date of Birth/Gender: Treating  RN: 1986/01/05 (37 y.o. Carl Conley Primary Care Physician: Belva Agee Other Clinician: Referring Physician: Treating Physician/Extender: Kirkland Hun, Vasilios Weeks in Treatment: 0 Education Assessment Education Provided To: Patient Education Topics Provided Wound/Skin Impairment: Methods: Explain/Verbal Responses: State content correctly Electronic Signature(s) Signed: 02/21/2023 1:56:54 PM By: Brenton Grills Entered By: Brenton Grills on 02/18/2023 15:17:37 -------------------------------------------------------------------------------- Wound Assessment Details Patient Name: Date of Service: Carl Conley, Carl IN S. 02/18/2023 2:45 PM Medical Record Number: [id number] Patient Account Number: [id number] Date of Birth/Sex: Treating RN: 1986/06/29 (37 y.o. Carl Conley Primary Care Oz Gammel: Belva Agee Other Clinician: Referring Brazos Sandoval: Treating Treyvonne Tata/Extender: Kirkland Hun, Vasilios Weeks in Treatment: 0 Wound Status Wound Number: 2 Primary Etiology: Diabetic Wound/Ulcer of the Lower Extremity Wound Location: Right, Lateral Malleolus Wound Status: Open Wounding Event: Pressure Injury Comorbid History: Cataracts, Type II Diabetes, Osteomyelitis, Neuropathy Date Acquired: 05/05/2022 Weeks Of Treatment: 0 Clustered Wound: No Carl Conley, Carl Conley ([id number]) [id number] Page 7 of 8 Photos Wound Measurements Length: (cm) 1 Width: (cm) 0.9 Depth: (cm) 0.1 Area: (cm) 0.707 Volume: (cm) 0.071 % Reduction in Area: % Reduction in Volume: Epithelialization: Medium (34-66%) Tunneling: No Undermining: No Wound Description Classification: Grade 2 Wound Margin: Distinct, outline attached Exudate Amount: Medium Exudate Type: Serosanguineous Exudate Color: red, brown Foul Odor After Cleansing: No Slough/Fibrino No Wound Bed Granulation Amount: Medium (34-66%) Exposed Structure Granulation  Quality: Red, Pink Fat Layer (Subcutaneous Tissue) Exposed: Yes Necrotic Amount: None Present (0%) Periwound Skin Texture Texture Color No Abnormalities Noted: Yes No Abnormalities Noted: Yes Moisture No Abnormalities Noted: No Dry / Scaly: No Maceration: No Treatment Notes Wound #2 (Malleolus) Wound Laterality: Right, Lateral Cleanser Peri-Wound Care Topical Primary Dressing Maxorb Extra Ag+ Alginate Dressing, 2x2 (in/in) Discharge Instruction: Apply to wound bed as instructed Secondary Dressing Zetuvit Plus Silicone Border Dressing 4x4 (in/in) Discharge Instruction: Apply silicone border over primary dressing as directed. Secured With Compression Wrap Compression Stockings Facilities manager) Signed: 02/18/2023 5:20:38 PM By: Zenaida Deed RN, BSN Signed: 02/21/2023 1:56:54 PM By: Brenton Grills Entered By: Zenaida Deed on 02/18/2023 16:12:57 [name] ([id number]) [id number] Page 8 of 8 -------------------------------------------------------------------------------- Vitals Details Patient Name: Date of Service: Carl Conley, Carl Conley IN S. 02/18/2023 2:45 PM Medical Record Number: [id number] Patient Account Number: [id number] Date of Birth/Sex: Treating RN: [birth year] (37 y.o. Carl Conley Primary Care Jillianna Stanek: Belva Agee Other Clinician: Referring Fujiko Picazo: Treating Lonisha Bobby/Extender: Kirkland Hun, Vasilios Weeks in Treatment: 0 Vital Signs Time Taken: 15:00 Temperature (F): 99 Height (in): 71 Pulse (bpm): 87 Source: Stated Respiratory Rate (breaths/min): 18 Weight (lbs): 185 Blood Pressure (mmHg): 111/77 Source: Stated Reference Range: 80 - 120 mg / dl Body Mass Index (BMI): 25.8 Electronic Signature(s) Signed: 02/21/2023 1:56:54 PM By: Brenton Grills Entered By: Brenton Grills on 02/18/2023 15:02:33

## 2023-02-21 NOTE — Progress Notes (Signed)
Carl Conley ([id number]) [id number] Page 1 of 8 Visit Report for 02/18/2023 Chief Complaint Document Details Patient Name: Date of Service: Carl Conley, Carl Conley IN S. 02/18/2023 2:45 PM Medical Record Number: [id number] Patient Account Number: [id number] Date of Birth/Sex: Treating RN: [birth year] (37 y.o. M) Primary Care Provider: Belva Agee Other Clinician: Referring Provider: Treating Provider/Extender: Kirkland Hun, Vasilios Weeks in Treatment: 0 Information Obtained from: Patient Chief Complaint Patients presents for treatment of a open diabetic ulcers after bilateral partial amputations (feet) Electronic Signature(s) Signed: 02/18/2023 3:38:51 PM By: Duanne Guess MD FACS Entered By: Duanne Guess on 02/18/2023 15:38:50 -------------------------------------------------------------------------------- Debridement Details Patient Name: Date of Service: Carl Conley IN S. 02/18/2023 2:45 PM Medical Record Number: [id number] Patient Account Number: [id number] Date of Birth/Sex: Treating RN: 09-05-1986 (37 y.o. Carl Conley Primary Care Provider: Belva Agee Other Clinician: Referring Provider: Treating Provider/Extender: Kirkland Hun, Vasilios Weeks in Treatment: 0 Debridement Performed for Assessment: Wound #2 Right,Lateral Malleolus Performed By: Physician Duanne Guess, MD Debridement Type: Debridement Severity of Tissue Pre Debridement: Fat layer exposed Level of Consciousness (Pre-procedure): Awake and Alert Pre-procedure Verification/Time Out Yes - 15:17 Taken: Start Time: 15:18 Pain Control: Lidocaine 4% T opical Solution T Area Debrided (L x W): otal 1 (cm) x 0.9 (cm) = 0.9 (cm) Tissue and other material debrided: Non-Viable, Slough, Slough Level: Non-Viable Tissue Debridement Description: Selective/Open Wound Instrument: Curette Bleeding: Minimum Hemostasis Achieved: Silver  Nitrate End Time: 15:19 Response to Treatment: Procedure was tolerated well Level of Consciousness (Post- Awake and Alert procedure): Post Debridement Measurements of Total Wound Length: (cm) 1 Width: (cm) 0.9 Depth: (cm) 0.1 Volume: (cm) 0.071 Character of Wound/Ulcer Post Debridement: Stable Severity of Tissue Post Debridement: Fat layer exposed Post Procedure Diagnosis Same as Pre-procedure Notes Scribed for Dr. Lady Gary by Brenton Grills RN. Electronic Signature(s) Signed: 02/18/2023 4:00:06 PM By: Duanne Guess MD FACS Signed: 02/21/2023 1:56:54 PM By: Naoma Diener, Maryagnes Amos ([id number]) [id number] Page 2 of 8 Entered By: Brenton Grills on 02/18/2023 15:20:57 -------------------------------------------------------------------------------- HPI Details Patient Name: Date of Service: Carl Conley IN S. 02/18/2023 2:45 PM Medical Record Number: [id number] Patient Account Number: [id number] Date of Birth/Sex: Treating RN: Oct 20, 1986 (37 y.o. M) Primary Care Provider: Belva Agee Other Clinician: Referring Provider: Treating Provider/Extender: Kirkland Hun, Vasilios Weeks in Treatment: 0 History of Present Illness HPI Description: ADMISSION 12/12/2022 This is a 37 year old type II diabetic (last hemoglobin A1c 6.5% on November 01, 2022). He was admitted to the hospital on November 17, 2022 with bilateral foot wounds. They had apparently been present for quite some time but worsened and plain films done as an outpatient showed osteomyelitis. Because of this finding, he was referred to the emergency department for admission and further evaluation and management. He subsequently underwent left transmetatarsal amputation, bone debridement of the right ankle and biopsy of the distal fibula and amputation of the right second toe. 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 While in the hospital, he received IV antibiotics and was discharged with another 4 weeks of IV cefepime via PICC line. Pathology from his operations demonstrated necrotizing inflammation and acute osteomyelitis. He has not yet had follow-up with podiatry; this is scheduled for tomorrow. His amputation sites are well-approximated with sutures and staples. The ankle wound has Integra in place; the silicone backing is peeling away. There is no concern for infection. 12/19/2022: Apparently the patient  did not have a ride to his visit with Dr. Annamary Rummage last week and canceled the appointment. Rather than reschedule, he elected to come to his visit with [place]. The Integra silicone layer on the right lateral ankle wound is buckling and starting to fall off of its own accord. He still has staples present on the lateral ankle, barely holding the silicone in place. Sutures and staples are intact in his other wounds. All wounds appear to be well- approximated and there is no evidence of infection. 02/18/2023: All of his amputation sites have healed up beautifully. The wound on his right lateral ankle is substantially smaller with granulation tissue on the surface. It is a little bit hypertrophic and there is a bit of slough in the center of the wound. Electronic Signature(s) Signed: 02/18/2023 3:39:38 PM By: Duanne Guess MD FACS Entered By: Duanne Guess on 02/18/2023 15:39:38 -------------------------------------------------------------------------------- Physical Exam Details Patient Name: Date of Service: Carl Conley IN S. 02/18/2023 2:45 PM Medical Record Number: [id number] Patient Account Number: [id number] Date of Birth/Sex: Treating RN: 1986-08-14 (37 y.o. M) Primary Care Provider: Belva Agee Other Clinician: Referring Provider: Treating Provider/Extender: Kirkland Hun, Vasilios Weeks in Treatment: 0 Constitutional . . . . no acute distress. Respiratory Normal work of breathing on room air. Notes 02/18/2023: All of his amputation sites have healed up beautifully. The wound on his right lateral ankle is substantially smaller with granulation tissue on the surface. It is a little bit hypertrophic and there is a bit of slough in the center of the wound. Electronic Signature(s) Signed: 02/18/2023 3:40:59 PM By: Duanne Guess MD FACS Entered By: Duanne Guess on 02/18/2023 15:40:58 -------------------------------------------------------------------------------- Physician Orders Details Patient Name: Date of Service: Carl Conley IN S. 02/18/2023 2:45 PM Medical Record Number: [id number] Patient Account Number: [id number] Date of Birth/Sex: Treating RN: 1986-03-26 (37 y.o. 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 - Dr. Lady Gary Rm 1 Return A Anesthetic (In clinic) Topical Lidocaine 4% applied to wound bed Bathing/ Shower/ Hygiene May shower and wash wound with soap and water. Additional Orders / Instructions Follow Nutritious Diet Home Health Wound #2 Right,Lateral Malleolus No change in wound care orders this week; continue Home Health for wound care. May utilize formulary equivalent dressing for wound treatment orders unless otherwise specified. Other Home Health Orders/Instructions: - [facility] Wound Treatment Wound #2  - Malleolus Wound Laterality: Right, Lateral Prim Dressing: Maxorb Extra Ag+ Alginate Dressing, 2x2 (in/in) 3 x Per Week/30 Days ary Discharge Instructions: Apply to wound bed as instructed Secondary Dressing: Zetuvit Plus Silicone Border Dressing 4x4 (in/in) 3 x Per Week/30 Days Discharge Instructions: Apply silicone border over primary dressing as directed. Electronic Signature(s) Signed: 02/18/2023 4:00:06 PM By: Duanne Guess MD FACS Entered By: Duanne Guess on 02/18/2023 15:43:11 -------------------------------------------------------------------------------- Problem List Details Patient Name: Date of Service: Carl Conley IN S. 02/18/2023 2:45 PM Medical Record Number: [id number] Patient Account Number: [id number] Date of Birth/Sex: Treating RN: 1986/04/11 (37 y.o. 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 02/18/2023 2:45 PM Medical Record Number: [id number] Patient Account Number: [id number] Date of Birth/Sex: Treating RN: November 05, 1986 (37 y.o. M) Primary Care Provider: Belva Agee Other Clinician: Referring Provider: Treating  Provider/Extender: Kirkland Hun, Vasilios Weeks in Treatment: 0 Subjective Chief Complaint Information obtained from Patient Patients presents for treatment of a open diabetic ulcers after bilateral partial amputations (feet) History of Present Illness (HPI) ADMISSION 12/12/2022 This is a 37 year old type II diabetic (last hemoglobin A1c 6.5% on November 01, 2022). He was admitted to the hospital on November 17, 2022 with bilateral foot wounds. They had apparently been present for quite some time but worsened and plain films done as an outpatient showed osteomyelitis. Because of this finding, he was referred to the emergency department for admission and further evaluation and management. He subsequently underwent left transmetatarsal amputation, bone debridement of the right ankle and biopsy of the distal fibula and amputation of the right second toe. 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 While in the hospital, he received IV antibiotics and was discharged with another 4 weeks of IV cefepime via PICC line. Pathology from his operations demonstrated necrotizing inflammation and acute osteomyelitis. He has not yet had follow-up with podiatry; this is scheduled for tomorrow. His amputation sites are well-approximated with sutures and staples. The ankle wound has Integra in place; the silicone backing is peeling away. There is no concern for infection. 12/19/2022: Apparently the patient did not have a ride to his visit with Dr. Annamary Rummage last week and canceled the appointment. Rather than reschedule, he elected to come to his visit with [place]. The Integra silicone layer on the right lateral ankle wound is buckling and starting to fall off of its own accord. He still has staples present on the lateral ankle, barely holding the  silicone in place. Sutures and staples are intact in his other wounds. All wounds appear to be well- approximated and there is no evidence of infection. 02/18/2023: All of his amputation sites have healed up beautifully. The wound on his right lateral ankle is substantially smaller with granulation tissue on the surface. It is a little bit hypertrophic and there is a bit of slough in the center of the wound. Patient History Information obtained from Patient. Family History No family history of Cancer, Diabetes, Heart Disease, Hereditary Spherocytosis, Hypertension, Kidney Disease, Lung Disease, Seizures, Stroke, Thyroid Problems, Tuberculosis. Social History Never smoker, Marital Status - Single, Alcohol Use - Never, Drug Use - No History, Caffeine Use - Rarely - diet soda. Medical History Eyes Patient has history of Cataracts - bil extracted Denies history of Glaucoma, Optic Neuritis Endocrine Patient has history of Type II Diabetes Denies history of Type I Diabetes Genitourinary Denies history of End Stage Renal Disease Integumentary (Skin) Denies history of History of Burn Musculoskeletal Patient has history of Osteomyelitis - bil feet Neurologic Patient has history of Neuropathy Oncologic Denies history of Received Chemotherapy, Received Radiation Psychiatric Denies  history of Anorexia/bulimia, Confinement Anxiety Hospitalization/Surgery History - abdonimal a-gram. - left transmet amputation. - right 2nd toe amputation. - wisdom tooth extraction. Review of Systems (ROS) Constitutional Symptoms [facility]) Carl Conley, Carl Conley ([id number]) [id number] Page 5 of 8 Denies complaints or symptoms of Fatigue, Fever, Chills, Marked Weight Change. Eyes Complains or has symptoms of Glasses / Contacts. Denies complaints or symptoms of Dry Eyes, Vision Changes. Ear/Nose/Mouth/Throat Denies complaints or symptoms of Chronic sinus problems or  rhinitis. Respiratory Denies complaints or symptoms of Chronic or frequent coughs, Shortness of Breath. Cardiovascular Denies complaints or symptoms of Chest pain. Gastrointestinal Denies complaints or symptoms of Frequent diarrhea, Nausea, Vomiting. Genitourinary Denies complaints or symptoms of Frequent urination. Integumentary (Skin) Complains or has symptoms of Wounds - right ankle. Musculoskeletal Denies complaints or symptoms of Muscle Pain, Muscle Weakness. Neurologic Complains or has symptoms of Numbness/parasthesias. Psychiatric Denies complaints or symptoms of Claustrophobia. Objective Constitutional no acute distress. Vitals Time Taken: 3:00 PM, Height: 71 in, Source: Stated, Weight: 185 lbs, Source: Stated, BMI: 25.8, Temperature: 99 F, Pulse: 87 bpm, Respiratory Rate: 18 breaths/min, Blood Pressure: 111/77 mmHg. Respiratory Normal work of breathing on room air. General Notes: 02/18/2023: All of his amputation sites have healed up beautifully. The wound on his right lateral ankle is substantially smaller with granulation tissue on the surface. It is a little bit hypertrophic and there is a bit of slough in the center of the wound. Integumentary (Hair, Skin) Wound #2 status is Open. Original cause of wound was Pressure Injury. The date acquired was: 05/05/2022. The wound is located on the Right,Lateral Malleolus. The wound measures 1cm length x 0.9cm width x 0.1cm depth; 0.707cm^2 area and 0.071cm^3 volume. There is Fat Layer (Subcutaneous Tissue) exposed. There is no tunneling or undermining noted. There is a medium amount of serosanguineous drainage noted. The wound margin is distinct with the outline attached to the wound base. There is medium (34-66%) red, pink granulation within the wound bed. There is no necrotic tissue within the wound bed. The periwound skin appearance had no abnormalities noted for texture. The periwound skin appearance had no abnormalities noted for  color. The periwound skin appearance did not exhibit: Dry/Scaly, Maceration. 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 There was a Selective/Open Wound Non-Viable Tissue Debridement with a total area of 0.9 sq cm performed by Duanne Guess, MD. With the following instrument(s): Curette to remove Non-Viable tissue/material. Material removed includes [facility] after achieving pain control using Lidocaine 4% T opical Solution. No specimens were taken. A time out was conducted at 15:17, prior to the start of the procedure. A Minimum amount of bleeding was controlled with Silver Nitrate. The procedure was tolerated well. Post Debridement Measurements: 1cm length x 0.9cm width x 0.1cm depth; 0.071cm^3 volume. Character of Wound/Ulcer Post Debridement is stable. Severity of Tissue Post Debridement is: Fat layer exposed. Post procedure Diagnosis Wound #2: Same as Pre-Procedure General Notes: Scribed for Dr. Lady Gary by Brenton Grills RN.Marland Kitchen Carl Conley, Carl Conley ([id number]) [id number] Page 6 of 8 Plan Follow-up Appointments: Return Appointment in 2 weeks. - Dr. Lady Gary Rm 1 Anesthetic: (In clinic) Topical Lidocaine 4% applied to wound bed Bathing/ Shower/ Hygiene: May shower and wash wound with soap and water. Additional Orders /  Instructions: Follow Nutritious Diet Home Health: Wound #2 Right,Lateral Malleolus: No change in wound care orders this week; continue Home Health for wound care. May utilize formulary equivalent dressing for wound treatment orders unless otherwise specified. Other Home Health Orders/Instructions: - [facility] WOUND #2: -  Malleolus Wound Laterality: Right, Lateral Prim Dressing: Maxorb Extra Ag+ Alginate Dressing, 2x2 (in/in) 3 x Per Week/30 Days ary Discharge Instructions: Apply to wound bed as instructed Secondary Dressing: Zetuvit Plus Silicone Border Dressing 4x4 (in/in) 3 x Per Week/30 Days Discharge Instructions: Apply silicone border over primary dressing as directed. 02/18/2023: All of his amputation sites have healed up beautifully. The wound on his right lateral ankle is substantially smaller with granulation tissue on the surface. It is a little bit hypertrophic and there is a bit of slough in the center of the wound. I used a curette to debride the slough from the wound and then chemically cauterized the hypertrophic granulation tissue with silver nitrate. We will use silver alginate and a foam border dressing. Out of concern for his transportation challenges, we will have him follow-up in 2 weeks. Electronic Signature(s) Signed: 02/18/2023 4:35:26 PM By: Duanne Guess MD FACS Signed: 02/18/2023 5:20:38 PM By: Zenaida Deed RN, BSN Previous Signature: 02/18/2023 3:43:52 PM Version By: Duanne Guess MD FACS Entered By: Zenaida Deed on 02/18/2023 16:16:16 -------------------------------------------------------------------------------- HxROS Details Patient Name: Date of Service: Carl Conley IN S. 02/18/2023 2:45 PM Medical Record Number: [id number] Patient Account Number: [id number] Date of Birth/Sex: Treating RN: 14-Aug-1986 (37 y.o. Carl Conley Primary Care Provider: Belva Agee Other Clinician: Referring Provider: Treating Provider/Extender: Kirkland Hun, Vasilios Weeks in Treatment: 0 Information Obtained From Patient Constitutional Symptoms (General Health) Complaints and Symptoms: Negative for: Fatigue; Fever; Chills; Marked Weight Change Eyes Complaints and Symptoms: Positive for: Glasses / Contacts Negative for: Dry Eyes; Vision Changes Medical  History: Positive for: Cataracts - bil extracted Negative for: Glaucoma; Optic Neuritis Ear/Nose/Mouth/Throat Complaints and Symptoms: Negative for: Chronic sinus problems or rhinitis Respiratory Complaints and Symptoms: Negative for: Chronic or frequent coughs; Shortness of Breath Cardiovascular Complaints and Symptoms: Carl Conley, Carl Conley ([id number]) [id number] Page 7 of 8 Negative for: Chest pain Gastrointestinal Complaints and Symptoms: Negative for: Frequent diarrhea; Nausea; Vomiting Genitourinary Complaints and Symptoms: Negative for: Frequent urination Medical History: Negative for: End Stage Renal Disease Integumentary (Skin) Complaints and Symptoms: Positive for: Wounds - right ankle Medical History: Negative for: History of Burn Musculoskeletal Complaints and Symptoms: Negative for: Muscle Pain; Muscle Weakness Medical History: Positive for: Osteomyelitis - bil feet Neurologic Complaints and Symptoms: Positive for: Numbness/parasthesias Medical History: Positive for: Neuropathy Psychiatric Complaints and Symptoms: Negative for: Claustrophobia Medical History: Negative for: Anorexia/bulimia; Confinement Anxiety Hematologic/Lymphatic Endocrine Medical History: Positive for: Type II Diabetes Negative for: Type I Diabetes Time with diabetes: since 2005 Treated with: Oral agents Blood sugar tested every day: No Immunological Oncologic Medical History: Negative for: Received Chemotherapy; Received Radiation HBO Extended History Items Eyes: Cataracts Immunizations Pneumococcal Vaccine: Received Pneumococcal Vaccination: Yes Received Pneumococcal Vaccination On or After 60th Birthday: No Implantable Devices No devices added Carl Conley, Carl Conley ([id number]) [id number] Page 8 of 8 Hospitalization / Surgery History Type of Hospitalization/Surgery abdonimal a-gram left transmet amputation right 2nd  toe amputation wisdom tooth extraction Family and Social History Cancer: No; Diabetes: No; Heart Disease: No; Hereditary Spherocytosis: No; Hypertension: No; Kidney Disease: No; Lung Disease: No; Seizures: No; Stroke: No; Thyroid Problems: No; Tuberculosis: No; Never smoker; Marital Status - Single; Alcohol Use: Never; Drug Use: No History; Caffeine Use: Rarely -  diet soda; Financial Concerns: No; Food, Clothing or Shelter Needs: No; Support System Lacking: No; Transportation Concerns: Yes - does not have a Investment banker, operational) Signed: 02/18/2023 4:00:06 PM By: Duanne Guess MD FACS Signed: 02/21/2023 1:56:54 PM By: Brenton Grills Entered By: Duanne Guess on 02/18/2023 15:40:31 -------------------------------------------------------------------------------- SuperBill Details Patient Name: Date of Service: Carl Conley IN S. 02/18/2023 Medical Record Number: [id number] Patient Account Number: [id number] Date of Birth/Sex: Treating RN: May 18, 1986 (37 y.o. 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 Modifier: Quantity: 1 Physician Procedures : CPT4 Code Description Modifier [id number] 99213 - WC PHYS LEVEL 3 - EST PT 25 ICD-10 Diagnosis Description 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

## 2023-02-23 ENCOUNTER — Telehealth: Payer: Self-pay | Admitting: Student

## 2023-02-23 ENCOUNTER — Telehealth: Payer: Self-pay | Admitting: Internal Medicine

## 2023-02-23 NOTE — Telephone Encounter (Signed)
Able to reach Carl Conley this evening. He was able to pick up his metformin and is tolerating taking 4 pills a day. He denies any symptoms of lightheadedness, dizziness, abdominal pain, nausea, vomiting or diarrhea.   His CGM was just approved and he will call [facility] tomorrow to pickup. Will have him follow up in clinic next week to repeat his labs. Given strict return precautions to call clinic sooner if symptoms arise.

## 2023-02-23 NOTE — Telephone Encounter (Signed)
Attempted to call patient 3x between 1pm-4pm today to reassess patient symptoms.   As noted in "result notes", the patient's glucose was elevated to 290 with a bicarb of 15 and anion gap of 16- concern for DKA. While he was asymptomatic on 4/17, I was calling to re-assess his symptoms, but was unable to get ahold of him.

## 2023-02-25 ENCOUNTER — Telehealth: Payer: Self-pay | Admitting: *Deleted

## 2023-02-25 NOTE — Telephone Encounter (Signed)
Agree thank you 

## 2023-02-25 NOTE — Telephone Encounter (Signed)
Received call from Arthur, RN with [facility]. Requesting VO to extend [facility] for one visit next week and one visit the following week to ensure right ankle wound is still healing. [facility] scraped wound and there is a little bit of yellow exudate. His next appt at [facility] is 4/29. Verbal auth given. Will route to [facility] for agreement/denial.

## 2023-02-27 ENCOUNTER — Ambulatory Visit (INDEPENDENT_AMBULATORY_CARE_PROVIDER_SITE_OTHER): Payer: Medicaid Other

## 2023-02-27 VITALS — BP 98/69 | HR 78 | Temp 98.2°F | Ht 71.0 in | Wt 236.0 lb

## 2023-02-27 DIAGNOSIS — I959 Hypotension, unspecified: Secondary | ICD-10-CM | POA: Diagnosis not present

## 2023-02-27 DIAGNOSIS — E1159 Type 2 diabetes mellitus with other circulatory complications: Secondary | ICD-10-CM

## 2023-02-27 DIAGNOSIS — R112 Nausea with vomiting, unspecified: Secondary | ICD-10-CM

## 2023-02-27 DIAGNOSIS — Z9641 Presence of insulin pump (external) (internal): Secondary | ICD-10-CM | POA: Diagnosis not present

## 2023-02-27 DIAGNOSIS — Z7984 Long term (current) use of oral hypoglycemic drugs: Secondary | ICD-10-CM | POA: Diagnosis not present

## 2023-02-27 DIAGNOSIS — M86171 Other acute osteomyelitis, right ankle and foot: Secondary | ICD-10-CM

## 2023-02-27 DIAGNOSIS — N179 Acute kidney failure, unspecified: Secondary | ICD-10-CM

## 2023-02-27 DIAGNOSIS — E1169 Type 2 diabetes mellitus with other specified complication: Secondary | ICD-10-CM | POA: Diagnosis not present

## 2023-02-27 DIAGNOSIS — M869 Osteomyelitis, unspecified: Secondary | ICD-10-CM

## 2023-02-27 NOTE — Assessment & Plan Note (Deleted)
Nausea and vomiting continue to be absent.  Question in the past for DKA given anion gap metabolic acidosis, hyperglycemia however no ketones on UA.  Patient has been doing well since discontinuing SGLT2i.  May need additional diabetes medication in the future given he is not on this anymore.  Last A1c 6.0, we will follow-up in the future with repeat and determine whether he needs additional medication.  He has yet to obtain his freestyle libre sensor. -Repeat BMP to assess kidney function and evaluate for acidosis -Follow-up as needed

## 2023-02-27 NOTE — Progress Notes (Signed)
   CC: 3-week follow-up  HPI:  Carl Conley is a 37 y.o. with past medical history as below who presents for 3-week follow-up.  Please see detail assessment plan for HPI.  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   Depression 10/31/2016   Diabetes mellitus    dx date 2004 at age 58   Low back pain 09/01/2016   Visual snow syndrome    Review of Systems: See detail assessment plan for pertinent ROS.  Physical Exam:  Vitals:   02/27/23 1537  BP: 98/69  Pulse: 78  Temp: 98.2 F (36.8 C)  TempSrc: Oral  SpO2: 100%  Weight: 236 lb (107 kg)  Height:  (1.803 m)   Physical Exam Constitutional:      General: He is not in acute distress. HENT:     Head: Normocephalic and atraumatic.     Comments: Seborrheic dermatitis. Eyes:     Extraocular Movements: Extraocular movements intact.  Cardiovascular:     Rate and Rhythm: Normal rate and regular rhythm.     Heart sounds: No murmur heard. Pulmonary:     Effort: Pulmonary effort is normal.     Breath sounds: No wheezing, rhonchi or rales.  Musculoskeletal:     Cervical back: Neck supple.     Right lower leg: No edema.     Left lower leg: No edema.  Skin:    General: Skin is warm and dry.  Neurological:     Mental Status: He is alert and oriented to person, place, and time.  Psychiatric:        Mood and Affect: Mood normal.        Behavior: Behavior normal.      Assessment & Plan:   See Encounters Tab for problem based charting.  Low blood pressure Blood pressure improved today.  Still suspicious for autonomic dysfunction in the setting of his diabetes.  Denies lightheadedness or dizziness.  Possibly benefiting from discontinuation of SGLT2 inhibitor. 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  Patient reports wound is healing.  Recently saw wound care  for debridement.  Scheduled to see podiatry tomorrow.  Wound is wrapped at today's visit.  Patient is afebrile denies fevers, chills or pain at site of wound. -Follow-up with podiatry tomorrow -Continue off SGLT2i  Acute kidney injury Presents with recent history of AKI in the setting of nausea, vomiting, SGLT2i use.  He has been hydrating well and his symptoms have resolved. -Repeat BMP -Renal ultrasound if no improvement  Diabetes (HCC) Nausea and vomiting continue to be absent.  Concern for DKA at recent visit given anion gap metabolic acidosis, hyperglycemia. No ketones on UA.  Patient has been doing well since discontinuing SGLT2i.  May need additional diabetes medication in the future given he is not on this anymore.  Last A1c 6.0, we will follow-up in the future with repeat and determine whether he needs additional medication.  He has yet to obtain his freestyle libre sensor. -Repeat BMP to assess kidney function and evaluate for acidosis -Please revisit topic of GLP-1 agonist at follow-up visit -Follow-up as needed  Patient discussed with Dr. Mikey Bussing

## 2023-02-27 NOTE — Assessment & Plan Note (Signed)
Presents with recent history of AKI in the setting of nausea, vomiting, SGLT2i use.  He has been hydrating well and his symptoms have resolved. -Repeat BMP -Renal ultrasound if no improvement

## 2023-02-27 NOTE — Assessment & Plan Note (Signed)
Blood pressure improved today.  Still suspicious for autonomic dysfunction in the setting of his diabetes.  Denies lightheadedness or dizziness.  Possibly benefiting from discontinuation of SGLT2 inhibitor. -Continue to hold SGLT2i

## 2023-02-27 NOTE — Assessment & Plan Note (Signed)
Nausea and vomiting continue to be absent.  Concern for DKA at recent visit given anion gap metabolic acidosis, hyperglycemia. No ketones on UA.  Patient has been doing well since discontinuing SGLT2i.  May need additional diabetes medication in the future given he is not on this anymore.  Last A1c 6.0, we will follow-up in the future with repeat and determine whether he needs additional medication.  He has yet to obtain his freestyle libre sensor. -Repeat BMP to assess kidney function and evaluate for acidosis -Please revisit topic of GLP-1 agonist at follow-up visit -Follow-up as needed

## 2023-02-27 NOTE — Assessment & Plan Note (Addendum)
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  Patient reports wound is healing.  Recently saw wound care for debridement.  Scheduled to see podiatry tomorrow.  Wound is wrapped at today's visit.  Patient is afebrile denies fevers, chills or pain at site of wound. -Follow-up with podiatry tomorrow -Continue off SGLT2i

## 2023-02-28 ENCOUNTER — Ambulatory Visit (INDEPENDENT_AMBULATORY_CARE_PROVIDER_SITE_OTHER): Payer: Medicaid Other | Admitting: Podiatry

## 2023-02-28 DIAGNOSIS — L97314 Non-pressure chronic ulcer of right ankle with necrosis of bone: Secondary | ICD-10-CM | POA: Diagnosis not present

## 2023-02-28 DIAGNOSIS — E1142 Type 2 diabetes mellitus with diabetic polyneuropathy: Secondary | ICD-10-CM

## 2023-02-28 DIAGNOSIS — Z9889 Other specified postprocedural states: Secondary | ICD-10-CM

## 2023-02-28 DIAGNOSIS — M869 Osteomyelitis, unspecified: Secondary | ICD-10-CM

## 2023-02-28 NOTE — Progress Notes (Signed)
Subjective:  Patient ID: Carl Conley, male    DOB: [birth year],  MRN: [id number]  Chief Complaint  Patient presents with   Routine Post Op    Patient states that he has been doing ok since his last visit. He states no pain.     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  37 y.o. male returns for follow-up of right lateral ankle ulceration.  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  He has healed his amputation sites without issue.  He is still continuing to improve regards to improvement in the right lateral ankle ulceration.  Has been going to wound care center every 2 weeks for this ulceration  Review of Systems: Negative except as noted in the HPI. Denies N/V/F/Ch.   Objective:  There were no vitals filed for this visit. There is no height or weight on file to calculate BMI. Constitutional Well developed. Well nourished.  Vascular Foot warm and well perfused. Capillary refill normal to all digits.  Calf is soft and supple, no posterior calf or knee pain, negative Homans' sign  Neurologic Normal speech. Oriented to person, place, and time. Epicritic sensation to light touch grossly present bilaterally.  Dermatologic Left foot transmetatarsal amputation site and right foot second toe amputation site fully healed with healthy skin and minimal to no callus or eschar formation Attention to the right lateral ankle there is an ulceration present with mild fibrotic tissue at the central base and healthy tissue surrounding.  Upon debridement of the wound there was no probe to bone the wound measured approximately 0.8x 0.7x 0.2 cm postdebridement.  There was good healthy bleeding no purulent drainage no maceration or erythema  surrounding the wound      Orthopedic: No tenderness to palpation noted about the surgical site.   Multiple view plain film radiographs: Deferred at this visit Assessment:   1. Postoperative state   2. 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 Diabetic osteomyelitis   4. DM type 2 with diabetic peripheral neuropathy       Plan:  Patient was evaluated and treated and all questions answered.  S/p foot surgery bilaterally, right foot lateral ankle ulceration debridement and bone biopsy as well as second toe amputation MPJ level.  Left foot transmetatarsal amputation and tendo Achilles lengthening -Progressing as expected post-operatively.  Patient is beginning to heal his right lateral ankle ulcer which does appear to be improving overall. -Right lateral ankle ulcer as below -XR: Deferred at this visit -WB Status: Weightbearing as tolerated in regular shoes -Medications: No indication for antibiotics -For the right ankle ulceration the patient has my approval and encouragement to continue proceeding with wound care center visits for the right lateral ankle ulceration for ongoing and continuous weekly wound care and debridements.  Appreciate assistance with wound care per wound care center -Continue with his current dressing regimen from the wound care center  Procedure: Excisional Debridement of Wound Rationale: Removal of non-viable soft tissue from the wound to promote healing.  Anesthesia: none Post-Debridement Wound Measurements: 0.8 cm x 0.7 cm x 0.2 cm  Type of Debridement: Sharp Excisional Tissue Removed: Non-viable soft tissue Depth of Debridement: subcutaneous tissue. Technique: Sharp excisional debridement to bleeding, viable wound base.  Dressing: Dry,  sterile, compression dressing. Disposition: Patient tolerated procedure well.   Return in about 4 weeks (around 03/28/2023) for f/u R lateral ankle ucler.             Corinna Gab, DPM [facility] / [facility]

## 2023-03-01 LAB — BMP8+ANION GAP
Anion Gap: 16 mmol/L (ref 10.0–18.0)
BUN/Creatinine Ratio: 26 — ABNORMAL HIGH (ref 9–20)
BUN: 39 mg/dL — ABNORMAL HIGH (ref 6–20)
CO2: 16 mmol/L — ABNORMAL LOW (ref 20–29)
Calcium: 9.3 mg/dL (ref 8.7–10.2)
Chloride: 104 mmol/L (ref 96–106)
Creatinine, Ser: 1.49 mg/dL — ABNORMAL HIGH (ref 0.76–1.27)
Glucose: 290 mg/dL — ABNORMAL HIGH (ref 70–99)
Potassium: 5.1 mmol/L (ref 3.5–5.2)
Sodium: 136 mmol/L (ref 134–144)
eGFR: 62 mL/min/{1.73_m2} (ref >59)

## 2023-03-04 ENCOUNTER — Encounter (HOSPITAL_BASED_OUTPATIENT_CLINIC_OR_DEPARTMENT_OTHER): Payer: Medicaid Other | Admitting: General Surgery

## 2023-03-04 DIAGNOSIS — E11622 Type 2 diabetes mellitus with other skin ulcer: Secondary | ICD-10-CM | POA: Diagnosis not present

## 2023-03-05 NOTE — Progress Notes (Signed)
KYRESE, GARTMAN ([id number]) [id number] Page 1 of 8 Visit Report for 03/04/2023 Chief Complaint Document Details Patient Name: Date of Service: Carl Conley, Carl Conley 03/04/2023 2:45 PM Medical Record Number: [id number] Patient Account Number: [id number] Date of Birth/Sex: Treating RN: 03-Dec-1985 (37 y.o. M) Primary Care Provider: Belva Agee Other Clinician: Referring Provider: Treating Provider/Extender: Kirkland Hun, Vasilios Weeks in Treatment: 2 Information Obtained from: Patient Chief Complaint Patients presents for treatment of a open diabetic ulcers after bilateral partial amputations (feet) Electronic Signature(s) Signed: 03/04/2023 2:56:28 PM By: Duanne Guess MD FACS Entered By: Duanne Guess on 03/04/2023 14:56:28 -------------------------------------------------------------------------------- Debridement Details Patient Name: Date of Service: Carl Stalling IN S. 03/04/2023 2:45 PM Medical Record Number: [id number] Patient Account Number: [id number] Date of Birth/Sex: Treating RN: Jan 05, 1986 (37 y.o. [name] Primary Care Provider: Belva Agee Other Clinician: Referring Provider: Treating Provider/Extender: Kirkland Hun, Vasilios Weeks in Treatment: 2 Debridement Performed for Assessment: Wound #2 Right,Lateral Malleolus Performed By: Physician Duanne Guess, MD Debridement Type: Debridement Severity of Tissue Pre Debridement: Fat layer exposed Level of Consciousness (Pre-procedure): Awake and Alert Pre-procedure Verification/Time Out Yes - 14:50 Taken: Start Time: 14:51 Pain Control: Lidocaine 4% T opical Solution Percent of Wound Bed Debrided: 100% T Area Debrided (cm): otal 0.31 Tissue and other material debrided: Non-Viable, Slough, Slough Level: Non-Viable Tissue Debridement Description: Selective/Open Wound Instrument: Curette Bleeding: Minimum Hemostasis  Achieved: Pressure End Time: 14:53 Response to Treatment: Procedure was tolerated well Level of Consciousness (Post- Awake and Alert procedure): Post Debridement Measurements of Total Wound Length: (cm) 0.8 Width: (cm) 0.5 Depth: (cm) 0.1 Volume: (cm) 0.031 Character of Wound/Ulcer Post Debridement: Improved Severity of Tissue Post Debridement: Fat layer exposed Post Procedure Diagnosis Same as Pre-procedure Notes Scribed for Dr Lady Gary by Brenton Grills RN. Electronic Signature(s) Signed: 03/04/2023 4:00:45 PM By: Duanne Guess MD FACS [name] ([id number]) [id number] Page 2 of 8 Signed: 03/04/2023 4:01:24 PM By: Brenton Grills Entered By: Brenton Grills on 03/04/2023 14:51:36 -------------------------------------------------------------------------------- HPI Details Patient Name: Date of Service: Carl Stalling IN S. 03/04/2023 2:45 PM Medical Record Number: [id number] Patient Account Number: [id number] Date of Birth/Sex: Treating RN: 1985/11/29 (37 y.o. M) Primary Care Provider: Belva Agee Other Clinician: Referring Provider: Treating Provider/Extender: Kirkland Hun, Vasilios Weeks in Treatment: 2 History of Present Illness HPI Description: ADMISSION 12/12/2022 This is a 37 year old type II diabetic (last hemoglobin A1c 6.5% on November 01, 2022). He was admitted to the hospital on November 17, 2022 with bilateral foot wounds. They had apparently been present for quite some time but worsened and plain films done as an outpatient showed osteomyelitis. Because of this finding, he was referred to the emergency department for admission and further evaluation and management. He subsequently underwent left transmetatarsal amputation, bone debridement of the right ankle and biopsy of the distal fibula and amputation of the right second toe. 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 While in the hospital, he received IV antibiotics and was discharged with another 4 weeks of IV cefepime via PICC line. Pathology from his operations demonstrated necrotizing inflammation and acute osteomyelitis. He has not yet had follow-up with podiatry; this is scheduled for tomorrow. His amputation sites are well-approximated with sutures and staples. The ankle wound has Integra in place; the silicone backing is peeling away. There is no concern for infection. 12/19/2022: Apparently the patient did not have a  ride to his visit with Dr. Annamary Rummage last week and canceled the appointment. Rather than reschedule, he elected to come to his visit with [place]. The Integra silicone layer on the right lateral ankle wound is buckling and starting to fall off of its own accord. He still has staples present on the lateral ankle, barely holding the silicone in place. Sutures and staples are intact in his other wounds. All wounds appear to be well- approximated and there is no evidence of infection. 02/18/2023: All of his amputation sites have healed up beautifully. The wound on his right lateral ankle is substantially smaller with granulation tissue on the surface. It is a little bit hypertrophic and there is a bit of slough in the center of the wound. 03/04/2023: His wound is substantially smaller. There is a little bit of slough on the surface, underneath which there is healthy granulation tissue. He does have some circular skin irritation right around his wound, suggesting perhaps he is sensitive to the adhesive being used. No actual skin breakdown, however. Electronic Signature(s) Signed: 03/04/2023 3:06:45 PM By: Duanne Guess MD FACS Entered By: Duanne Guess on 03/04/2023 15:06:45 -------------------------------------------------------------------------------- Physical Exam  Details Patient Name: Date of Service: Carl Stalling IN S. 03/04/2023 2:45 PM Medical Record Number: [id number] Patient Account Number: [id number] Date of Birth/Sex: Treating RN: January 13, 1986 (37 y.o. M) Primary Care Provider: Belva Agee Other Clinician: Referring Provider: Treating Provider/Extender: Kirkland Hun, Vasilios Weeks in Treatment: 2 Constitutional . . . . no acute distress. Respiratory Normal work of breathing on room air. Notes 03/04/2023: His wound is substantially smaller. There is a little bit of slough on the surface, underneath which there is healthy granulation tissue. He does have some circular skin irritation right around his wound, suggesting perhaps he is sensitive to the adhesive being used. No actual skin breakdown, however. Electronic Signature(s) Signed: 03/04/2023 3:08:27 PM By: Duanne Guess MD FACS Entered By: Duanne Guess on 03/04/2023 15:08:27 Physician Orders Details -------------------------------------------------------------------------------- [name] ([id number]) [id number] Page 3 of 8 Patient Name: Date of Service: Carl Conley, Carl Conley 03/04/2023 2:45 PM Medical Record Number: [id number] Patient Account Number: [id number] Date of Birth/Sex: Treating RN: 1986/06/09 (37 y.o. 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 - Dr. Lady Gary Rm 1 Return A Anesthetic (In clinic) Topical Lidocaine 4% applied to wound bed Bathing/ Shower/ Hygiene May shower and wash  wound with soap and water. Additional Orders / Instructions Follow Nutritious Diet Home Health Wound #2 Right,Lateral Malleolus No change in wound care orders this week; continue Home Health for wound care. May utilize formulary equivalent dressing for wound treatment orders unless otherwise specified. Other Home Health Orders/Instructions: - [facility] Wound Treatment Wound #2 - Malleolus Wound Laterality: Right, Lateral Prim Dressing: Maxorb Extra Ag+ Alginate Dressing, 2x2 (in/in) 3 x Per Week/30 Days ary Discharge Instructions: Apply to wound bed as instructed Secondary Dressing: Zetuvit Plus Silicone Border Dressing 4x4 (in/in) 3 x Per Week/30 Days Discharge Instructions: Apply silicone border over primary dressing as directed. Electronic Signature(s) Signed: 03/04/2023 4:00:45 PM By: Duanne Guess MD FACS Entered By: Duanne Guess on 03/04/2023 15:08:41 -------------------------------------------------------------------------------- Problem List Details Patient Name: Date of Service: Carl Stalling IN S. 03/04/2023  2:45 PM Medical Record Number: [id number] Patient Account Number: [id number] Date of Birth/Sex: Treating RN: 11-Jun-1986 (37 y.o. 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 03/04/2023 2:45 PM Medical Record Number: [id number] Patient Account Number: [id number] Date of Birth/Sex: Treating RN: 07/21/1986 (37 y.o. M) Primary Care Provider: Belva Agee Other Clinician: Referring Provider: Treating Provider/Extender: Kirkland Hun, Vasilios Weeks in Treatment: 2 Subjective Chief Complaint Information obtained from Patient Patients presents for treatment of a open diabetic ulcers after bilateral partial amputations (feet) History of Present Illness (HPI) ADMISSION 12/12/2022 This is a 37 year old type II diabetic (last hemoglobin A1c 6.5% on November 01, 2022). He was admitted to the hospital on November 17, 2022 with bilateral foot wounds. They had apparently been present for quite some time but worsened and plain films done as an outpatient showed osteomyelitis. Because of this finding, he was referred to the emergency department for admission and further evaluation and management. He subsequently underwent left transmetatarsal amputation, bone debridement of the right ankle and biopsy of the distal fibula and amputation of the right second toe. 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 While in the hospital, he received IV antibiotics and was discharged with another 4 weeks of IV cefepime via PICC line. Pathology from his operations demonstrated necrotizing inflammation and acute osteomyelitis. He has not yet had follow-up with podiatry; this is scheduled for tomorrow. His amputation sites are well-approximated with sutures and staples. The ankle wound has Integra in place; the silicone backing is peeling away. There is  no concern for infection. 12/19/2022: Apparently the patient did not have a ride to his visit with Dr. Annamary Rummage last week and canceled the appointment. Rather than reschedule, he elected to come to his visit with [place]. The Integra silicone layer on the right lateral ankle wound is buckling and starting to fall off of its own accord. He still has staples present on the lateral ankle, barely holding the silicone in place. Sutures and staples are intact in his other wounds. All wounds appear to be well- approximated and there is no evidence of infection. 02/18/2023: All of his amputation sites have healed up beautifully. The wound on his right lateral ankle is substantially smaller with granulation tissue on the surface. It is a little bit hypertrophic and there is a bit of slough in the center of the wound. 03/04/2023: His wound is substantially smaller. There is a little bit of slough on the surface, underneath which there is healthy granulation tissue. He does have some circular skin irritation right around his wound, suggesting perhaps he is sensitive to the adhesive being used. No actual skin breakdown, however. Patient History Information obtained from Patient. Family History No family history of Cancer, Diabetes, Heart Disease, Hereditary Spherocytosis, Hypertension, Kidney Disease, Lung Disease, Seizures, Stroke, Thyroid Problems, Tuberculosis.  Social History Never smoker, Marital Status - Single, Alcohol Use - Never, Drug Use - No History, Caffeine Use - Rarely - diet soda. Medical History Eyes Patient has history of Cataracts - bil extracted Denies history of Glaucoma, Optic Neuritis Endocrine Patient has history of Type II Diabetes Denies history of Type I Diabetes Genitourinary Denies history of End Stage Renal Disease Integumentary (Skin) Denies history of History of Burn Musculoskeletal Patient has history of Osteomyelitis - bil feet Neurologic Patient has history of  Neuropathy Oncologic Carl Conley, Carl Conley ([id number]) [id number] Page 5 of 8 Denies history of Received Chemotherapy, Received Radiation Psychiatric Denies history of Anorexia/bulimia, Confinement Anxiety Hospitalization/Surgery History - abdonimal a-gram. - left transmet amputation. - right 2nd toe amputation. - wisdom tooth extraction. Objective Constitutional no acute distress. Vitals Time Taken: 2:27 PM, Height: 71 in, Weight: 185 lbs, BMI: 25.8, Temperature: 98.2 F, Pulse: 71 bpm, Respiratory Rate: 18 breaths/min, Blood Pressure: 100/69 mmHg. Respiratory Normal work of breathing on room air. General Notes: 03/04/2023: His wound is substantially smaller. There is a little bit of slough on the surface, underneath which there is healthy granulation tissue. He does have some circular skin irritation right around his wound, suggesting perhaps he is sensitive to the adhesive being used. No actual skin breakdown, however. Integumentary (Hair, Skin) Wound #2 status is Open. Original cause of wound was Pressure Injury. The date acquired was: 05/05/2022. The wound has been in treatment 2 weeks. The wound is located on the Right,Lateral Malleolus. The wound measures 0.8cm length x 0.5cm width x 0.1cm depth; 0.314cm^2 area and 0.031cm^3 volume. There is Fat Layer (Subcutaneous Tissue) exposed. There is no tunneling or undermining noted. There is a medium amount of serosanguineous drainage noted. The wound margin is distinct with the outline attached to the wound base. There is large (67-100%) red, pink granulation within the wound bed. There is no necrotic tissue within the wound bed. The periwound skin appearance had no abnormalities noted for texture. The periwound skin appearance had no abnormalities noted for color. The periwound skin appearance did not exhibit: Dry/Scaly, Maceration. Assessment Active Problems ICD-10 Non-pressure chronic ulcer of right ankle with  necrosis of bone Type 2 diabetes mellitus with other skin ulcer Osteomyelitis, unspecified Procedures Wound #2 Pre-procedure diagnosis of Wound #2 is a Diabetic Wound/Ulcer of the Lower Extremity located on the Right,Lateral Malleolus .Severity of Tissue Pre Debridement is: Fat layer exposed. There was a Selective/Open Wound Non-Viable Tissue Debridement with a total area of 0.31 sq cm performed by Duanne Guess, MD. With the following instrument(s): Curette to remove Non-Viable tissue/material. Material removed includes [facility] after achieving pain control using Lidocaine 4% T opical Solution. No specimens were taken. A time out was conducted at 14:50, prior to the start of the procedure. A Minimum amount of bleeding was controlled with Pressure. The procedure was tolerated well. Post Debridement Measurements: 0.8cm length x 0.5cm width x 0.1cm depth; 0.031cm^3 volume. Character of Wound/Ulcer Post Debridement is improved. Severity of Tissue Post Debridement is: Fat layer exposed. Post procedure Diagnosis Wound #2: Same as Pre-Procedure General Notes: Scribed for Dr Lady Gary by Brenton Grills RN.Marland Kitchen Plan Follow-up Appointments: Return Appointment in 2 weeks. - Dr. Lady Gary Rm 1 Anesthetic: (In clinic) Topical Lidocaine 4% applied to wound bed Bathing/ Shower/ Hygiene: May shower and wash wound with soap and water. Additional Orders / Instructions: Follow Nutritious Diet Home Health: Wound #2 Right,Lateral Malleolus: Carl Conley, Carl Conley ([id number]) [id number] Page 6 of 8 No change in wound care orders this week;  continue Home Health for wound care. May utilize formulary equivalent dressing for wound treatment orders unless otherwise specified. Other Home Health Orders/Instructions: - [facility] WOUND #2: - Malleolus Wound Laterality: Right, Lateral Prim Dressing: Maxorb Extra Ag+ Alginate Dressing, 2x2 (in/in) 3 x Per Week/30 Days ary Discharge Instructions: Apply to  wound bed as instructed Secondary Dressing: Zetuvit Plus Silicone Border Dressing 4x4 (in/in) 3 x Per Week/30 Days Discharge Instructions: Apply silicone border over primary dressing as directed. 03/04/2023: His wound is substantially smaller. There is a little bit of slough on the surface, underneath which there is healthy granulation tissue. He does have some circular skin irritation right around his wound, suggesting perhaps he is sensitive to the adhesive being used. No actual skin breakdown, however. I used a curette to debride the slough from his wound. We will continue silver alginate with a foam border dressing. I asked him to keep an eye on the red area around the site and let me know if it gets any worse. Follow up in 2 weeks. Electronic Signature(s) Signed: 03/04/2023 3:15:24 PM By: Duanne Guess MD FACS Entered By: Duanne Guess on 03/04/2023 15:15:24 -------------------------------------------------------------------------------- HxROS Details Patient Name: Date of Service: Carl Stalling IN S. 03/04/2023 2:45 PM Medical Record Number: [id number] Patient Account Number: [id number] Date of Birth/Sex: Treating RN: [birth year] (37 y.o. M) Primary Care Provider: Belva Agee Other Clinician: Referring Provider: Treating Provider/Extender: Kirkland Hun, Vasilios Weeks in Treatment: 2 Information Obtained From Patient Eyes Medical History: Positive for: Cataracts - bil extracted Negative for: Glaucoma; Optic Neuritis Endocrine Medical History: Positive for: Type II Diabetes Negative for: Type I Diabetes Time with diabetes: since 2005 Treated with: Oral agents Blood sugar tested every day: No Genitourinary Medical History: Negative for: End Stage Renal Disease Integumentary (Skin) Medical History: Negative for: History of Burn Musculoskeletal Medical History: Positive for: Osteomyelitis - bil feet Neurologic Medical History: Positive for:  Neuropathy Oncologic Medical History: Negative for: Received Chemotherapy; Received Radiation Carl Conley, Carl Conley ([id number]) [id number] Page 7 of 8 Psychiatric Medical History: Negative for: Anorexia/bulimia; Confinement Anxiety HBO Extended History Items Eyes: Cataracts Immunizations Pneumococcal Vaccine: Received Pneumococcal Vaccination: Yes Received Pneumococcal Vaccination On or After 60th Birthday: No Implantable Devices No devices added Hospitalization / Surgery History Type of Hospitalization/Surgery abdonimal a-gram left transmet amputation right 2nd toe amputation wisdom tooth extraction Family and Social History Cancer: No; Diabetes: No; Heart Disease: No; Hereditary Spherocytosis: No; Hypertension: No; Kidney Disease: No; Lung Disease: No; Seizures: No; Stroke: No; Thyroid Problems: No; Tuberculosis: No; Never smoker; Marital Status - Single; Alcohol Use: Never; Drug Use: No History; Caffeine Use: Rarely - diet soda; Financial Concerns: No; Food, Clothing or Shelter Needs: No; Support System Lacking: No; Transportation Concerns: Yes - does not have a Investment banker, operational) Signed: 03/04/2023 4:00:45 PM By: Duanne Guess MD FACS Entered By: Duanne Guess on 03/04/2023 15:06:51 -------------------------------------------------------------------------------- SuperBill Details Patient Name: Date of Service: Carl Stalling IN S. 03/04/2023 Medical Record Number: [id number] Patient Account Number: [id number] Date of Birth/Sex: Treating RN: Jun 17, 1986 (37 y.o. 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 E11.622 Type 2 diabetes mellitus with other skin ulcer M86.9  Osteomyelitis, unspecified Facility Procedures : CPT4 Code: [id number] Description: 97597 - DEBRIDE WOUND 1ST 20 SQ CM OR < ICD-10 Diagnosis Description L97.314 Non-pressure chronic ulcer of right ankle with necrosis of  bone Modifier: Quantity: 1 Physician Procedures : CPT4 Code Description Modifier [id number] - WC PHYS LEVEL 3 - EST PT 25 ICD-10 Diagnosis Description 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

## 2023-03-05 NOTE — Progress Notes (Signed)
KAZUMI, LACHNEY ([id number]) [id number] Page 1 of 6 Visit Report for 03/04/2023 Arrival Information Details Patient Name: Date of Service: Carl Conley, Carl Conley IN S. 03/04/2023 2:45 PM Medical Record Number: [id number] Patient Account Number: [id number] Date of Birth/Sex: Treating RN: 1985/12/18 (37 y.o. Carl Conley Primary Care Carl Conley: Carl Conley Other Clinician: Referring Carl Conley: Treating Carl Conley/Extender: Carl Conley, Carl Conley in Treatment: 2 Visit Information History Since Last Visit All ordered tests and consults were completed: Yes Patient Arrived: Ambulatory Added or deleted any medications: No Arrival Time: 14:24 Any new allergies or adverse reactions: No Accompanied By: self Had a fall or experienced change in No Transfer Assistance: None activities of daily living that may affect Patient Identification Verified: Yes risk of falls: Secondary Verification Process Completed: Yes Signs or symptoms of abuse/neglect since last visito No Patient Requires Transmission-Based Precautions: No Hospitalized since last visit: No Patient Has Alerts: No Implantable device outside of the clinic excluding No cellular tissue based products placed in the center since last visit: Has Dressing in Place as Prescribed: Yes Pain Present Now: No Electronic Signature(s) Signed: 03/04/2023 4:01:24 PM By: Brenton Grills Entered By: Brenton Grills on 03/04/2023 14:25:23 -------------------------------------------------------------------------------- Encounter Discharge Information Details Patient Name: Date of Service: Carl Conley IN S. 03/04/2023 2:45 PM Medical Record Number: [id number] Patient Account Number: [id number] Date of Birth/Sex: Treating RN: [birth year] (37 y.o. Carl Conley Primary Care Carl Conley: Carl Conley Other Clinician: Referring Carl Conley: Treating Carl Conley: Carl Conley,  Carl Conley in Treatment: 2 Encounter Discharge Information Items Post Procedure Vitals Discharge Condition: Stable Temperature (F): 97.9 Ambulatory Status: Ambulatory Pulse (bpm): 84 Discharge Destination: Home Respiratory Rate (breaths/min): 18 Transportation: Private Auto Blood Pressure (mmHg): 100/78 Accompanied By: self Schedule Follow-up Appointment: Yes Clinical Summary of Care: Patient Declined Electronic Signature(s) Signed: 03/04/2023 4:01:24 PM By: Brenton Grills Entered By: Brenton Grills on 03/04/2023 15:00:01 -------------------------------------------------------------------------------- Lower Extremity Assessment Details Patient Name: Date of Service: Carl Conley IN S. 03/04/2023 2:45 PM Medical Record Number: [id number] Patient Account Number: [id number] Date of Birth/Sex: Treating RN: 1986/08/15 (37 y.o. Carl Conley Primary Care Elleana Stillson: Carl Conley Other Clinician: Referring Carl Conley: Treating Carl Conley/Extender: Carl Conley, Carl Conley in Treatment: 2 Edema Assessment M[Left: Carl Conley, Carl Conley ([id number])] [Right: [id number] Page 2 of 6] Assessed: [Left: No] [Right: No] Edema: [Left: N] [Right: o] Calf Left: Right: Point of Measurement: From Medial Instep 35 cm Ankle Left: Right: Point of Measurement: From Medial Instep 24.5 cm Vascular Assessment Pulses: Dorsalis Pedis Palpable: [Right:Yes] Electronic Signature(s) Signed: 03/04/2023 4:01:24 PM By: Brenton Grills Entered By: Brenton Grills on 03/04/2023 14:31:09 -------------------------------------------------------------------------------- Multi Wound Chart Details Patient Name: Date of Service: Carl Conley IN S. 03/04/2023 2:45 PM Medical Record Number: [id number] Patient Account Number: [id number] Date of Birth/Sex: Treating RN: Sep 23, 1986 (37 y.o. M) Primary Care Carl Conley: Carl Conley Other Clinician: Referring  Carl Conley: Treating Carl Conley/Extender: Carl Conley, Carl Conley in Treatment: 2 Vital Signs Height(in): 71 Pulse(bpm): 71 Weight(lbs): 185 Blood Pressure(mmHg): 100/69 Body Mass Index(BMI): 25.8 Temperature(F): 98.2 Respiratory Rate(breaths/min): 18 [2:Photos:] [N/A:N/A] Right, Lateral Malleolus N/A N/A Wound Location: Pressure Injury N/A N/A Wounding Event: Diabetic Wound/Ulcer of the Lower N/A N/A Primary Etiology: Extremity Cataracts, Type II Diabetes, N/A N/A Comorbid History: Osteomyelitis, Neuropathy 05/05/2022 N/A N/A Date Acquired: 2 N/A N/A Conley of Treatment: Open N/A N/A Wound Status: No N/A N/A Wound Recurrence: 0.8x0.5x0.1 N/A N/A Measurements L x W x D (cm) 0.314 N/A N/A A (cm) : rea 0.031 N/A N/A Volume (cm) : 55.60% N/A N/A % Reduction  in A rea: 56.30% N/A N/A % Reduction in Volume: Grade 2 N/A N/A Classification: Medium N/A N/A Exudate A mount: Serosanguineous N/A N/A Exudate Type: red, brown N/A N/A Exudate Color: Distinct, outline attached N/A N/A Wound Margin: Large (67-100%) N/A N/A Granulation A mount: Red, Pink N/A N/A Granulation Quality: None Present (0%) N/A N/A Necrotic A mount: Fat Layer (Subcutaneous Tissue): Yes N/A N/A Exposed StructuresJUNAH, Carl Conley ([id number]) [id number] Page 3 of 6 Medium (34-66%) N/A N/A Epithelialization: Debridement - Selective/Open Wound N/A N/A Debridement: 14:50 N/A N/A Pre-procedure Verification/Time Out Taken: Lidocaine 4% Topical Solution N/A N/A Pain Control: Slough N/A N/A Tissue Debrided: Non-Viable Tissue N/A N/A Level: 0.31 N/A N/A Debridement A (sq cm): rea Curette N/A N/A Instrument: Minimum N/A N/A Bleeding: Pressure N/A N/A Hemostasis A chieved: Procedure was tolerated well N/A N/A Debridement Treatment Response: 0.8x0.5x0.1 N/A N/A Post Debridement Measurements L x W x D (cm) 0.031 N/A N/A Post Debridement  Volume: (cm) No Abnormalities Noted N/A N/A Periwound Skin Texture: Maceration: No N/A N/A Periwound Skin Moisture: Dry/Scaly: No No Abnormalities Noted N/A N/A Periwound Skin Color: Debridement N/A N/A Procedures Performed: Treatment Notes Electronic Signature(s) Signed: 03/04/2023 2:56:14 PM By: Duanne Guess MD FACS Entered By: Duanne Guess on 03/04/2023 14:56:14 -------------------------------------------------------------------------------- Multi-Disciplinary Care Plan Details Patient Name: Date of Service: Carl Conley IN S. 03/04/2023 2:45 PM Medical Record Number: [id number] Patient Account Number: [id number] Date of Birth/Sex: Treating RN: May 11, 1986 (37 y.o. Carl Conley Primary Care Bettye Sitton: Carl Conley Other Clinician: Referring Ianna Salmela: Treating Napoleon Monacelli/Extender: Carl Conley, Carl Conley in Treatment: 2 Active Inactive Pressure Nursing Diagnoses: Knowledge deficit related to causes and risk factors for pressure ulcer development Goals: Patient will remain free from development of additional pressure ulcers Date Initiated: 02/18/2023 Target Resolution Date: 03/27/2023 Goal Status: Active Interventions: Provide education on pressure ulcers Notes: Wound/Skin Impairment Nursing Diagnoses: Impaired tissue integrity Goals: Patient/caregiver will verbalize understanding of skin care regimen Date Initiated: 02/18/2023 Target Resolution Date: 02/25/2023 Goal Status: Active Interventions: Assess patient/caregiver ability to obtain necessary supplies Assess patient/caregiver ability to perform ulcer/skin care regimen upon admission and as needed Assess ulceration(s) every visit Provide education on ulcer and skin care Treatment Activities: Patient referred to home care : 02/18/2023 Skin care regimen initiated : 02/18/2023 Carl Conley, Carl Conley ([id number]) [id number] Page 4 of 6 Topical wound  management initiated : 02/18/2023 Notes: Electronic Signature(s) Signed: 03/04/2023 4:01:24 PM By: Brenton Grills Entered By: Brenton Grills on 03/04/2023 14:38:34 -------------------------------------------------------------------------------- Pain Assessment Details Patient Name: Date of Service: Carl Conley, Carl Conley IN S. 03/04/2023 2:45 PM Medical Record Number: [id number] Patient Account Number: [id number] Date of Birth/Sex: Treating RN: Jan 10, 1986 (37 y.o. Carl Conley Primary Care Won Kreuzer: Carl Conley Other Clinician: Referring Elmer Merwin: Treating Danyale Ridinger/Extender: Carl Conley, Carl Conley in Treatment: 2 Active Problems Location of Pain Severity and Description of Pain Patient Has Paino No Site Locations Pain Management and Medication Current Pain Management: Electronic Signature(s) Signed: 03/04/2023 4:01:24 PM By: Brenton Grills Entered By: Brenton Grills on 03/04/2023 14:27:44 -------------------------------------------------------------------------------- Patient/Caregiver Education Details Patient Name: Date of Service: Carl Conley 4/29/2024andnbsp2:45 PM Medical Record Number: [id number] Patient Account Number: [id number] Date of Birth/Gender: Treating RN: 30-Jan-1986 (37 y.o. Carl Conley Primary Care Physician: Carl Conley Other Clinician: Referring Physician: Treating Physician/Extender: Carl Conley, Carl Conley in Treatment: 2 Education Assessment Education Provided To: Patient Education Topics Provided Wound/Skin Impairment: Methods: Explain/Verbal Responses: State content correctly Carl Conley, Carl Conley ([id number]) [id number] Page 5 of 6 Electronic Signature(s) Signed: 03/04/2023 4:01:24 PM  By: Isabelle Course By: Brenton Grills on 03/04/2023 14:38:56 -------------------------------------------------------------------------------- Wound Assessment  Details Patient Name: Date of Service: Carl Conley, Carl Conley IN S. 03/04/2023 2:45 PM Medical Record Number: [id number] Patient Account Number: [id number] Date of Birth/Sex: Treating RN: 1986/07/13 (37 y.o. Carl Conley Primary Care Loni Delbridge: Carl Conley Other Clinician: Referring Anaaya Fuster: Treating Rissie Sculley/Extender: Carl Conley, Carl Conley in Treatment: 2 Wound Status Wound Number: 2 Primary Etiology: Diabetic Wound/Ulcer of the Lower Extremity Wound Location: Right, Lateral Malleolus Wound Status: Open Wounding Event: Pressure Injury Comorbid History: Cataracts, Type II Diabetes, Osteomyelitis, Neuropathy Date Acquired: 05/05/2022 Conley Of Treatment: 2 Clustered Wound: No Photos Wound Measurements Length: (cm) 0.8 Width: (cm) 0.5 Depth: (cm) 0.1 Area: (cm) 0.314 Volume: (cm) 0.031 % Reduction in Area: 55.6% % Reduction in Volume: 56.3% Epithelialization: Medium (34-66%) Tunneling: No Undermining: No Wound Description Classification: Grade 2 Wound Margin: Distinct, outline attached Exudate Amount: Medium Exudate Type: Serosanguineous Exudate Color: red, brown Foul Odor After Cleansing: No Slough/Fibrino No Wound Bed Granulation Amount: Large (67-100%) Exposed Structure Granulation Quality: Red, Pink Fat Layer (Subcutaneous Tissue) Exposed: Yes Necrotic Amount: None Present (0%) Periwound Skin Texture Texture Color No Abnormalities Noted: Yes No Abnormalities Noted: Yes Moisture No Abnormalities Noted: No Dry / Scaly: No Maceration: No Treatment Notes Wound #2 (Malleolus) Wound Laterality: Right, Lateral Carl Conley, Carl Conley ([id number]) [id number] Page 6 of 6 Peri-Wound Care Topical Primary Dressing Maxorb Extra Ag+ Alginate Dressing, 2x2 (in/in) Discharge Instruction: Apply to wound bed as instructed Secondary Dressing Zetuvit Plus Silicone Border Dressing 4x4 (in/in) Discharge Instruction:  Apply silicone border over primary dressing as directed. Secured With Compression Wrap Compression Stockings Facilities manager) Signed: 03/04/2023 4:01:24 PM By: Brenton Grills Entered By: Brenton Grills on 03/04/2023 14:44:46 -------------------------------------------------------------------------------- Vitals Details Patient Name: Date of Service: Carl Conley IN S. 03/04/2023 2:45 PM Medical Record Number: [id number] Patient Account Number: [id number] Date of Birth/Sex: Treating RN: 20-Sep-1986 (37 y.o. Carl Conley Primary Care Maxamus Colao: Carl Conley Other Clinician: Referring Rayhaan Huster: Treating Kesha Hurrell/Extender: Carl Conley, Carl Conley in Treatment: 2 Vital Signs Time Taken: 14:27 Temperature (F): 98.2 Height (in): 71 Pulse (bpm): 71 Weight (lbs): 185 Respiratory Rate (breaths/min): 18 Body Mass Index (BMI): 25.8 Blood Pressure (mmHg): 100/69 Reference Range: 80 - 120 mg / dl Electronic Signature(s) Signed: 03/04/2023 4:01:24 PM By: Brenton Grills Entered By: Brenton Grills on 03/04/2023 14:27:28

## 2023-03-06 NOTE — Progress Notes (Signed)
Internal Medicine Clinic Attending  Case discussed with the resident at the time of the visit.  We reviewed the resident's history and exam and pertinent patient test results.  I agree with the assessment, diagnosis, and plan of care documented in the resident's note.  

## 2023-03-07 ENCOUNTER — Ambulatory Visit: Payer: Medicaid Other

## 2023-03-07 ENCOUNTER — Other Ambulatory Visit: Payer: Self-pay

## 2023-03-07 VITALS — BP 96/60 | HR 73 | Temp 98.1°F | Ht 71.0 in | Wt 234.1 lb

## 2023-03-07 DIAGNOSIS — E1159 Type 2 diabetes mellitus with other circulatory complications: Secondary | ICD-10-CM | POA: Diagnosis not present

## 2023-03-07 DIAGNOSIS — Z7984 Long term (current) use of oral hypoglycemic drugs: Secondary | ICD-10-CM | POA: Diagnosis not present

## 2023-03-07 DIAGNOSIS — Z794 Long term (current) use of insulin: Secondary | ICD-10-CM

## 2023-03-07 MED ORDER — INSULIN PEN NEEDLE 32G X 4 MM MISC: 1.0000 | Freq: Every day | 2 refills | Status: DC

## 2023-03-07 MED ORDER — INSULIN GLARGINE 100 UNITS/ML SOLOSTAR PEN: 20.0000 [IU] | PEN_INJECTOR | Freq: Every day | SUBCUTANEOUS | 11 refills | Status: DC

## 2023-03-07 NOTE — Progress Notes (Signed)
   CC: Diabetes follow-up  HPI:  [name] is a 37 y.o. with past medical history as below who presents for follow-up of his diabetes.  Please see detailed assessment and plan for HPI.  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  Physical Exam:  Vitals:   03/07/23 1336 03/07/23 1342  BP: 98/66 96/60  Pulse: 74 73  Temp: 98.1 F (36.7 C)   TempSrc: Oral   SpO2: 100%   Weight: 234 lb 1.6 oz (106.2 kg)   Height: 5\' 11"  (1.803 m)    Physical Exam Constitutional:      General: He is not in acute distress. HENT:     Head: Normocephalic and atraumatic.  Eyes:     Extraocular Movements: Extraocular movements intact.  Pulmonary:     Effort: Pulmonary effort is normal.  Musculoskeletal:     Cervical back: Neck supple.  Neurological:     Mental Status: He is alert and oriented to person, place, and time.  Psychiatric:        Mood and Affect: Mood normal.        Behavior: Behavior normal.      Assessment & Plan:   See Encounters Tab for problem based charting.  Diabetes [facility]) Patient presents for follow-up of his diabetes.  Last A1c 6.0 about a month ago but his glucoses have been 290 at last 2 BMPs.  This is likely because his SGLT2i was discontinued due to low blood pressure and concerns for acidosis.  Discussed GLP-1 with him today and he is very averse to this medication due to concerns for not being able to ever come off of it.  I shared with him that the data suggests patients to come off of it came back about two thirds of the way to initially lost and that otherwise the drug is of great benefit for cardiovascular health in addition to blood sugar management.  Despite this, he would like to avoid this  medication.  Patient has been on Lantus in the past and is amenable to start this at 20 units daily. -Start Lantus 20 u is daily -Continue metformin 2 g daily -Return in 2 to 3 months for A1c  Patient discussed with Dr. Mikey Bussing

## 2023-03-07 NOTE — Assessment & Plan Note (Addendum)
Patient presents for follow-up of his diabetes.  Last A1c 6.0 about a month ago but his glucoses have been 290 at last 2 BMPs.  This is likely because his SGLT2i was discontinued due to low blood pressure and concerns for acidosis.  Discussed GLP-1 with him today and he is very averse to this medication due to concerns for not being able to ever come off of it.  I shared with him that the data suggests patients to come off of it came back about two thirds of the way to initially lost and that otherwise the drug is of great benefit for cardiovascular health in addition to blood sugar management.  Despite this, he would like to avoid this medication.  Patient has been on Lantus in the past and is amenable to start this at 20 units daily. -Start Lantus 20 u is daily -Continue metformin 2 g daily -Return in 2 to 3 months for A1c

## 2023-03-07 NOTE — Patient Instructions (Signed)
Carl Conley, it was a pleasure seeing you today!  Today we discussed: Diabetes - Take Lantus 20 units daily. Return in 2-3 months for A1c check.  I have ordered the following labs today:  Lab Orders  No laboratory test(s) ordered today     Tests ordered today:  none  Referrals ordered today:   Referral Orders  No referral(s) requested today     I have ordered the following medication/changed the following medications:   Stop the following medications: There are no discontinued medications.   Start the following medications: Meds ordered this encounter  Medications   insulin glargine (LANTUS) 100 unit/mL SOPN    Sig: Inject 20 Units into the skin at bedtime.    Dispense:  6 mL    Refill:  11     Follow-up: 2-3 months   Please make sure to arrive 15 minutes prior to your next appointment. If you arrive late, you may be asked to reschedule.   We look forward to seeing you next time. Please call our clinic at [phone] if you have any questions or concerns. The best time to call is Monday-Friday from 9am-4pm, but there is someone available 24/7. If after hours or the weekend, call the main hospital number and ask for the Internal Medicine Resident On-Call. If you need medication refills, please notify your pharmacy one week in advance and they will send [place] a request.  Thank you for letting us take part in your care. Wishing you the best!  Thank you, [name], MD

## 2023-03-08 NOTE — Progress Notes (Signed)
Internal Medicine Clinic Attending  Case discussed with the resident at the time of the visit.  We reviewed the resident's history and exam and pertinent patient test results.  I agree with the assessment, diagnosis, and plan of care documented in the resident's note.  

## 2023-03-18 ENCOUNTER — Encounter (HOSPITAL_BASED_OUTPATIENT_CLINIC_OR_DEPARTMENT_OTHER): Payer: Medicaid Other | Attending: General Surgery | Admitting: General Surgery

## 2023-03-18 DIAGNOSIS — E1140 Type 2 diabetes mellitus with diabetic neuropathy, unspecified: Secondary | ICD-10-CM | POA: Insufficient documentation

## 2023-03-18 DIAGNOSIS — Z89421 Acquired absence of other right toe(s): Secondary | ICD-10-CM | POA: Diagnosis not present

## 2023-03-18 DIAGNOSIS — M869 Osteomyelitis, unspecified: Secondary | ICD-10-CM | POA: Insufficient documentation

## 2023-03-18 DIAGNOSIS — Z89432 Acquired absence of left foot: Secondary | ICD-10-CM | POA: Insufficient documentation

## 2023-03-18 DIAGNOSIS — E11622 Type 2 diabetes mellitus with other skin ulcer: Secondary | ICD-10-CM | POA: Insufficient documentation

## 2023-03-18 DIAGNOSIS — L97314 Non-pressure chronic ulcer of right ankle with necrosis of bone: Secondary | ICD-10-CM | POA: Diagnosis present

## 2023-03-21 ENCOUNTER — Ambulatory Visit (INDEPENDENT_AMBULATORY_CARE_PROVIDER_SITE_OTHER): Payer: Medicaid Other | Admitting: Podiatry

## 2023-03-21 DIAGNOSIS — L97314 Non-pressure chronic ulcer of right ankle with necrosis of bone: Secondary | ICD-10-CM | POA: Diagnosis not present

## 2023-03-21 DIAGNOSIS — E1142 Type 2 diabetes mellitus with diabetic polyneuropathy: Secondary | ICD-10-CM | POA: Diagnosis not present

## 2023-03-21 DIAGNOSIS — Z9889 Other specified postprocedural states: Secondary | ICD-10-CM

## 2023-03-21 NOTE — Progress Notes (Signed)
  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  37 y.o. male returns for follow-up of right lateral ankle ulceration.  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  He has healed his amputation sites without issue.  He states that the lateral ankle ulceration is now healed with no drainage.  He has been covering with silver alginate dressing but there has been no drainage at all and seems to have fully healed up at this time  Review of Systems: Negative except as noted in the HPI. Denies N/V/F/Ch.   Objective:  There were no vitals filed for this visit. There is no height or weight on file to calculate BMI. Constitutional Well developed. Well nourished.  Vascular Foot warm and well perfused. Capillary refill normal to all digits.  Calf is soft and supple, no posterior calf or knee pain, negative Homans' sign  Neurologic Normal speech. Oriented to person, place, and time. Epicritic sensation to light touch grossly present bilaterally.  Dermatologic Left foot transmetatarsal amputation site and right foot second toe amputation site fully healed with healthy skin and minimal to no callus or eschar formation Attention to the right lateral ankle there is noted to be a small eschar present inside of prior ulceration however there is no opening of the skin is dry and clean.  There is mild irritation of the skin around the wound likely from adhesive dermatitis.    Orthopedic: No tenderness to palpation noted about the surgical site.   Multiple view plain film radiographs:  Deferred at this visit Assessment:   1. Postoperative state   2. 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 DM type 2 with diabetic peripheral neuropathy (HCC)        Plan:  Patient was evaluated and treated and all questions answered.  S/p foot surgery bilaterally, right foot lateral ankle ulceration debridement and bone biopsy as well as second toe amputation MPJ level.  Left foot transmetatarsal amputation and tendo Achilles lengthening -Much improved from prior wound is fully healed at this time of the right lateral ankle -XR: Deferred at this visit -WB Status: Weightbearing as tolerated in regular shoes -Medications: No indication for antibiotics -No further dressing care needed.  Continue to monitor for reopening of the wound.   Return in about 4 weeks (around 04/18/2023) for F/u R lateral ankle ulcer.             Corinna Gab, DPM [facility] / [facility]

## 2023-04-03 ENCOUNTER — Ambulatory Visit (HOSPITAL_BASED_OUTPATIENT_CLINIC_OR_DEPARTMENT_OTHER): Payer: Medicaid Other | Admitting: General Surgery

## 2023-04-08 ENCOUNTER — Encounter: Payer: Self-pay | Admitting: Student

## 2023-04-18 ENCOUNTER — Ambulatory Visit: Payer: Medicaid Other | Admitting: Internal Medicine

## 2023-04-18 ENCOUNTER — Telehealth: Payer: Self-pay | Admitting: Internal Medicine

## 2023-04-18 NOTE — Telephone Encounter (Signed)
error 

## 2023-04-25 ENCOUNTER — Ambulatory Visit: Payer: Medicaid Other | Admitting: Podiatry

## 2023-05-06 NOTE — Progress Notes (Signed)
Carl Conley, KROCKER ([id number]) [id number] Page 1 of 7 Visit Report for 03/18/2023 Chief Complaint Document Details Patient Name: Date of Service: CARLUS, ULLERY IN S. 03/18/2023 2:45 PM Medical Record Number: [id number] Patient Account Number: [id number] Date of Birth/Sex: Treating RN: [birth year] (37 y.o. M) Primary Care Provider: Belva Agee Other Clinician: Referring Provider: Treating Provider/Extender: Kirkland Hun, Vasilios Weeks in Treatment: 4 Information Obtained from: Patient Chief Complaint Patients presents for treatment of a open diabetic ulcers after bilateral partial amputations (feet) Electronic Signature(s) Signed: 03/18/2023 3:15:07 PM By: Duanne Guess MD FACS Entered By: Duanne Guess on 03/18/2023 15:15:07 -------------------------------------------------------------------------------- HPI Details Patient Name: Date of Service: Carl Stalling IN S. 03/18/2023 2:45 PM Medical Record Number: [id number] Patient Account Number: [id number] Date of Birth/Sex: Treating RN: 1986/06/09 (37 y.o. M) Primary Care Provider: Belva Agee Other Clinician: Referring Provider: Treating Provider/Extender: Kirkland Hun, Vasilios Weeks in Treatment: 4 History of Present Illness HPI Description: ADMISSION 12/12/2022 This is a 37 year old type II diabetic (last hemoglobin A1c 6.5% on November 01, 2022). He was admitted to the hospital on November 17, 2022 with bilateral foot wounds. They had apparently been present for quite some time but worsened and plain films done as an outpatient showed osteomyelitis. Because of this finding, he was referred to the emergency department for admission and further evaluation and management. He subsequently underwent left transmetatarsal amputation, bone debridement of the right ankle and biopsy of the distal fibula and amputation of the right second toe. 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 While in the hospital, he received IV antibiotics and was discharged with another 4 weeks of IV cefepime via PICC line. Pathology from his operations demonstrated necrotizing inflammation and acute osteomyelitis. He has not yet had follow-up with podiatry; this is scheduled for tomorrow. His amputation sites are well-approximated with sutures and staples. The ankle wound has Integra in place; the silicone backing is peeling away. There is no concern for infection. 12/19/2022: Apparently the patient did not have a ride to his visit with Dr. Annamary Rummage last week and canceled the appointment. Rather than reschedule, he elected to come to his visit with [place]. The Integra silicone layer on the right lateral ankle wound is buckling and starting to fall off of its own accord. He still has staples present on the lateral ankle, barely holding the silicone in place. Sutures and staples are intact in his other wounds. All wounds appear to be well- approximated and there is no evidence of infection. 02/18/2023: All of his amputation sites have healed up beautifully. The wound on his right lateral ankle is substantially smaller with granulation tissue on the surface. It is a little bit hypertrophic and there is a bit of slough in the center of the wound. 03/04/2023: His wound is substantially smaller. There is a little bit of slough on the surface, underneath which there is healthy granulation tissue. He does have some circular skin irritation right around his wound, suggesting perhaps he is sensitive to the adhesive being used. No actual skin breakdown, however. 03/18/2023: The wound is down to less than a millimeter. No concern for infection. Electronic Signature(s) Signed: 03/18/2023 3:15:39 PM By: Duanne Guess MD FACS [name]  ([id number]) [id number] Page 2 of 7 Entered By: Duanne Guess on 03/18/2023 15:15:39 -------------------------------------------------------------------------------- Physical Exam Details Patient Name: Date of Service: Carl Conley, Carl IN S. 03/18/2023 2:45 PM  Medical Record Number: [id number] Patient Account Number: [id number] Date of Birth/Sex: Treating RN: [birth year] (37 y.o. M) Primary Care Provider: Belva Agee Other Clinician: Referring Provider: Treating Provider/Extender: Kirkland Hun, Vasilios Weeks in Treatment: 4 Constitutional . . . . no acute distress. Respiratory Normal work of breathing on room air. Notes 03/18/2023: The wound is down to less than a millimeter. No concern for infection. Electronic Signature(s) Signed: 03/18/2023 3:19:01 PM By: Duanne Guess MD FACS Entered By: Duanne Guess on 03/18/2023 15:19:01 -------------------------------------------------------------------------------- Physician Orders Details Patient Name: Date of Service: Carl Stalling IN S. 03/18/2023 2:45 PM Medical Record Number: [id number] Patient Account Number: [id number] Date of Birth/Sex: Treating RN: 1986-02-16 (37 y.o. 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 - Dr. Lady Gary - room 3 Return A Bathing/ Shower/ Hygiene May shower and wash wound with soap and water. Additional Orders / Instructions Follow Nutritious Diet Home Health Wound #2 Right,Lateral Malleolus Discontinue home health for  wound care. Other Home Health Orders/Instructions: [facility] Wound Treatment ZACARIA, GEGG ([id number]) [id number] Page 3 of 7 Wound #2 - Malleolus Wound Laterality: Right, Lateral Prim Dressing: Maxorb Extra Ag+ Alginate Dressing, 2x2 (in/in) 3 x Per Week/30 Days ary Discharge Instructions: Apply to wound bed as instructed Secondary Dressing: Zetuvit Plus Silicone Border Dressing 4x4 (in/in) 3 x Per Week/30 Days Discharge Instructions: Apply silicone border over primary dressing as directed. Patient Medications llergies: doxycycline A Notifications Medication Indication Start End 03/18/2023 lidocaine DOSE topical 4 % cream - cream topical Electronic Signature(s) Signed: 03/18/2023 3:20:43 PM By: Duanne Guess MD FACS Entered By: Duanne Guess on 03/18/2023 15:19:12 -------------------------------------------------------------------------------- Problem List Details Patient Name: Date of Service: Carl Stalling IN S. 03/18/2023 2:45 PM Medical Record Number: [id number] Patient Account Number: [id number] Date of Birth/Sex: Treating RN: [birth year] (37 y.o. 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 03/18/2023 2:45 PM Medical Record Number: [id number] Patient Account Number: [id number] Date of Birth/Sex: Treating RN: 06/03/1986 (37 y.o. M) Primary Care Provider: Belva Agee Other Clinician: Referring Provider: Treating Provider/Extender: Kirkland Hun, Vasilios Weeks in Treatment: 4 Subjective Chief Complaint Information obtained from Patient Patients presents for treatment of a open diabetic ulcers after bilateral partial amputations (feet) History of Present Illness (HPI) ADMISSION 12/12/2022 This is a 37 year old type II diabetic (last hemoglobin A1c 6.5% on November 01, 2022). He was admitted to the hospital on November 17, 2022 with bilateral foot wounds. They had apparently been present for quite some time but worsened and plain films done as an outpatient showed osteomyelitis. Because of this finding, he was referred to the emergency department for admission and further evaluation  and management. He subsequently underwent left transmetatarsal amputation, bone debridement of the right ankle and biopsy of the distal fibula and amputation of the right second toe. 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 While in the hospital, he received IV antibiotics and was discharged with another 4 weeks of IV cefepime via PICC line. Pathology from his operations demonstrated necrotizing inflammation and acute osteomyelitis. He has not yet had follow-up with podiatry; this is scheduled for tomorrow. His amputation sites are well-approximated with sutures and staples. The ankle wound has Integra in place; the silicone backing is peeling away. There is no concern for  infection. 12/19/2022: Apparently the patient did not have a ride to his visit with Dr. Annamary Rummage last week and canceled the appointment. Rather than reschedule, he elected to come to his visit with [place]. The Integra silicone layer on the right lateral ankle wound is buckling and starting to fall off of its own accord. He still has staples present on the lateral ankle, barely holding the silicone in place. Sutures and staples are intact in his other wounds. All wounds appear to be well- approximated and there is no evidence of infection. 02/18/2023: All of his amputation sites have healed up beautifully. The wound on his right lateral ankle is substantially smaller with granulation tissue on the surface. It is a little bit hypertrophic and there is a bit of slough in the center of the wound. 03/04/2023: His wound is substantially smaller. There is a little bit of slough on the surface, underneath which there is healthy granulation tissue. He does have some circular skin irritation right around his wound, suggesting perhaps he is sensitive to the adhesive being used. No actual skin breakdown, however. 03/18/2023: The wound is down to less than a millimeter. No concern for infection. Patient History Information obtained from Patient. Family History No family history of Cancer, Diabetes, Heart Disease, Hereditary Spherocytosis, Hypertension, Kidney Disease, Lung Disease, Seizures, Stroke, Thyroid Problems, Tuberculosis. Social History Never smoker, Marital Status - Single, Alcohol Use - Never, Drug Use - No History, Caffeine Use - Rarely - diet soda. Medical History Eyes Patient has history of Cataracts - bil extracted Denies history of Glaucoma, Optic Neuritis Endocrine Patient has history of Type II Diabetes Denies history of Type I Diabetes Genitourinary Denies history of End Stage Renal Disease Integumentary (Skin) Denies history of History of Burn Musculoskeletal Patient has history of  Osteomyelitis - bil feet Neurologic Patient has history of Neuropathy Oncologic Denies history of Received Chemotherapy, Received Radiation Psychiatric Denies history of Anorexia/bulimia, Confinement Anxiety Hospitalization/Surgery History - abdonimal a-gram. - left transmet amputation. - right 2nd toe amputation. - wisdom tooth extraction. [address] JHADEN, DROTAR ([id number]) [id number] Page 5 of 7 Constitutional no acute distress. Vitals Time Taken: 2:51 AM, Height: 71 in, Weight: 185 lbs, BMI: 25.8, Temperature: 98.9 F, Pulse: 80 bpm, Respiratory Rate: 20 breaths/min, Blood Pressure: 109/75 mmHg. Respiratory Normal work of breathing on room air. General Notes: 03/18/2023: The wound is down to less than a millimeter. No concern for infection. Integumentary (Hair, Skin) Wound #2 status is Open. Original cause of wound was Pressure Injury. The date acquired was: 05/05/2022. The wound has been in treatment 4 weeks. The wound is located on the Right,Lateral Malleolus. The wound measures 0.1cm length x 0.1cm width x 0.1cm depth; 0.008cm^2 area and 0.001cm^3 volume. There is Fat Layer (Subcutaneous Tissue)  exposed. There is no tunneling or undermining noted. There is a medium amount of serosanguineous drainage noted. The wound margin is distinct with the outline attached to the wound base. There is large (67-100%) red, pink granulation within the wound bed. There is no necrotic tissue within the wound bed. The periwound skin appearance had no abnormalities noted for texture. The periwound skin appearance had no abnormalities noted for color. The periwound skin appearance exhibited: Dry/Scaly. The periwound skin appearance did not exhibit: Maceration. 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 - Dr. Lady Gary - room 3 Bathing/ Shower/ Hygiene: May shower and wash wound with soap and water. Additional Orders / Instructions: Follow Nutritious Diet Home Health: Wound #2 Right,Lateral Malleolus: Discontinue home health for wound care. Other Home Health Orders/Instructions: [facility] The following medication(s) was prescribed: lidocaine topical 4 % cream cream topical was prescribed at facility WOUND #2: - Malleolus Wound Laterality: Right, Lateral Prim Dressing: Maxorb Extra Ag+ Alginate Dressing, 2x2 (in/in) 3 x Per Week/30 Days ary Discharge Instructions: Apply to wound bed as instructed Secondary Dressing: Zetuvit Plus Silicone Border Dressing 4x4 (in/in) 3 x Per Week/30 Days Discharge Instructions: Apply silicone border over primary dressing as directed. 03/18/2023: The wound is down to less than a millimeter. No concern for infection. No debridement was necessary today. We will continue silver alginate and a foam border dressing. Will schedule him for follow-up visit in 2 weeks, but if the wound heals completely and he does not wish to have [place] recheck the site, he may cancel that appointment. Electronic Signature(s) Signed: 05/06/2023 1:52:05 PM By: Pearletha Alfred Signed: 05/06/2023 2:55:18 PM By: Duanne Guess MD FACS Previous Signature: 03/18/2023 3:19:56 PM Version By: Duanne Guess MD FACS Entered By: Pearletha Alfred on 05/06/2023 13:52:05 -------------------------------------------------------------------------------- HxROS Details Patient Name: Date of Service: Carl Stalling IN S. 03/18/2023 2:45 PM [name] ([id number]) [id number] Page 6 of 7 Medical Record Number: [id number] Patient Account Number: [id number] Date of Birth/Sex: Treating RN: [birth year] (37 y.o. M) Primary Care Provider: Belva Agee Other Clinician: Referring Provider: Treating Provider/Extender: Kirkland Hun,  Vasilios Weeks in Treatment: 4 Information Obtained From Patient Eyes Medical History: Positive for: Cataracts - bil extracted Negative for: Glaucoma; Optic Neuritis Endocrine Medical History: Positive for: Type II Diabetes Negative for: Type I Diabetes Time with diabetes: since 2005 Treated with: Oral agents Blood sugar tested every day: No Genitourinary Medical History: Negative for: End Stage Renal Disease Integumentary (Skin) Medical History: Negative for: History of Burn Musculoskeletal Medical History: Positive for: Osteomyelitis - bil feet Neurologic Medical History: Positive for: Neuropathy Oncologic Medical History: Negative for: Received Chemotherapy; Received Radiation Psychiatric Medical History: Negative for: Anorexia/bulimia; Confinement Anxiety HBO Extended History Items Eyes: Cataracts Immunizations Pneumococcal Vaccine: Received Pneumococcal Vaccination: Yes Received Pneumococcal Vaccination On or After 60th Birthday: No Implantable Devices No devices added Hospitalization / Surgery History Type of Hospitalization/Surgery abdonimal a-gram left transmet amputation right 2nd toe amputation wisdom tooth extraction Family and Social History Cancer: No; Diabetes: No; Heart Disease: No; Hereditary Spherocytosis: No; Hypertension: No; Kidney Disease: No; Lung Disease: No; Seizures: NoGIANMARCO, SETHER ([id number]) [id number] Page 7 of 7 Stroke: No; Thyroid Problems: No; Tuberculosis: No; Never smoker; Marital Status - Single; Alcohol Use: Never; Drug Use: No History; Caffeine Use: Rarely - diet soda; Financial Concerns: No; Food, Clothing or Shelter Needs: No; Support System  Lacking: No; Transportation Concerns: Yes - does not have a Investment banker, operational) Signed: 03/18/2023 3:20:43 PM By: Duanne Guess MD FACS Entered By: Duanne Guess on 03/18/2023  15:18:41 -------------------------------------------------------------------------------- SuperBill Details Patient Name: Date of Service: Carl Stalling IN S. 03/18/2023 Medical Record Number: [id number] Patient Account Number: [id number] Date of Birth/Sex: Treating RN: January 28, 1986 (37 y.o. 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

## 2023-05-07 NOTE — Progress Notes (Signed)
Carl Conley, Carl Conley ([id number]) [id number] Page 1 of 7 Visit Report for 03/18/2023 Arrival Information Details Patient Name: Date of Service: Carl Conley, Carl Conley IN S. 03/18/2023 2:45 PM Medical Record Number: [id number] Patient Account Number: [id number] Date of Birth/Sex: Treating RN: Feb 24, 1986 (37 y.o. M) Primary Care Han Vejar: Belva Agee Other Clinician: Referring Jerline Linzy: Treating Kyeshia Zinn/Extender: Kirkland Hun, Vasilios Weeks in Treatment: 4 Visit Information History Since Last Visit All ordered tests and consults were completed: No Patient Arrived: Ambulatory Added or deleted any medications: No Arrival Time: 14:51 Any new allergies or adverse reactions: No Accompanied By: self Had a fall or experienced change in No Transfer Assistance: None activities of daily living that may affect Patient Identification Verified: Yes risk of falls: Secondary Verification Process Completed: Yes Signs or symptoms of abuse/neglect since last visito No Patient Requires Transmission-Based Precautions: No Hospitalized since last visit: No Patient Has Alerts: No Implantable device outside of the clinic excluding No cellular tissue based products placed in the center since last visit: Pain Present Now: No Electronic Signature(s) Signed: 03/18/2023 3:21:40 PM By: Dayton Scrape Entered By: Dayton Scrape on 03/18/2023 14:51:30 -------------------------------------------------------------------------------- Clinic Level of Care Assessment Details Patient Name: Date of Service: Carl Conley, Carl Conley IN S. 03/18/2023 2:45 PM Medical Record Number: [id number] Patient Account Number: [id number] Date of Birth/Sex: Treating RN: May 23, 1986 (37 y.o. M) Primary Care Rasheeda Mulvehill: Belva Agee Other Clinician: Referring Rajeev Escue: Treating Kymberlie Brazeau/Extender: Kirkland Hun, Vasilios Weeks in Treatment: 4 Clinic Level of Care Assessment Items TOOL 4  Quantity Score []  - 0 Use when only an EandM is performed on FOLLOW-UP visit ASSESSMENTS - Nursing Assessment / Reassessment X- 1 10 Reassessment of Co-morbidities (includes updates in patient status) X- 1 5 Reassessment of Adherence to Treatment Plan ASSESSMENTS - Wound and Skin A ssessment / Reassessment X - Simple Wound Assessment / Reassessment - one wound 1 5 []  - 0 Complex Wound Assessment / Reassessment - multiple wounds []  - 0 Dermatologic / Skin Assessment (not related to wound area) ASSESSMENTS - Focused Assessment []  - 0 Circumferential Edema Measurements - multi extremities []  - 0 Nutritional Assessment / Counseling / Intervention VIPUL, MCCLEAF ([id number]) [id number] Page 2 of 7 []  - 0 Lower Extremity Assessment (monofilament, tuning fork, pulses) []  - 0 Peripheral Arterial Disease Assessment (using hand held doppler) ASSESSMENTS - Ostomy and/or Continence Assessment and Care []  - 0 Incontinence Assessment and Management []  - 0 Ostomy Care Assessment and Management (repouching, etc.) PROCESS - Coordination of Care X - Simple Patient / Family Education for ongoing care 1 15 []  - 0 Complex (extensive) Patient / Family Education for ongoing care X- 1 10 Staff obtains Chiropractor, Records, T Results / Process Orders est []  - 0 Staff telephones HHA, Nursing Homes / Clarify orders / etc []  - 0 Routine Transfer to another Facility (non-emergent condition) []  - 0 Routine Hospital Admission (non-emergent condition) []  - 0 New Admissions / Manufacturing engineer / Ordering NPWT Apligraf, etc. , []  - 0 Emergency Hospital Admission (emergent condition) X- 1 10 Simple Discharge Coordination []  - 0 Complex (extensive) Discharge Coordination PROCESS - Special Needs []  - 0 Pediatric / Minor Patient Management []  - 0 Isolation Patient Management []  - 0 Hearing / Language / Visual special needs []  - 0 Assessment of Community  assistance (transportation, D/C planning, etc.) []  - 0 Additional assistance / Altered mentation []  - 0 Support Surface(s) Assessment (bed, cushion, seat, etc.) INTERVENTIONS - Wound Cleansing / Measurement X - Simple Wound Cleansing - one wound 1 5 []  -  0 Complex Wound Cleansing - multiple wounds X- 1 5 Wound Imaging (photographs - any number of wounds) []  - 0 Wound Tracing (instead of photographs) X- 1 5 Simple Wound Measurement - one wound []  - 0 Complex Wound Measurement - multiple wounds INTERVENTIONS - Wound Dressings X - Small Wound Dressing one or multiple wounds 1 10 []  - 0 Medium Wound Dressing one or multiple wounds []  - 0 Large Wound Dressing one or multiple wounds []  - 0 Application of Medications - topical []  - 0 Application of Medications - injection INTERVENTIONS - Miscellaneous []  - 0 External ear exam []  - 0 Specimen Collection (cultures, biopsies, blood, body fluids, etc.) []  - 0 Specimen(s) / Culture(s) sent or taken to Lab for analysis []  - 0 Patient Transfer (multiple staff / Nurse, adult / Similar devices) []  - 0 Simple Staple / Suture removal (25 or less) []  - 0 Complex Staple / Suture removal (26 or more) []  - 0 Hypo / Hyperglycemic Management (close monitor of Blood Glucose) Carl Conley, Carl Conley ([id number]) [id number] Page 3 of 7 []  - 0 Ankle / Brachial Index (ABI) - do not check if billed separately X- 1 5 Vital Signs Has the patient been seen at the hospital within the last three years: Yes Total Score: 85 Level Of Care: New/Established - Level 3 Electronic Signature(s) Signed: 05/06/2023 3:00:20 PM By: Pearletha Alfred Entered By: Pearletha Alfred on 04/03/2023 11:10:58 -------------------------------------------------------------------------------- Encounter Discharge Information Details Patient Name: Date of Service: Carl Conley IN S. 03/18/2023 2:45 PM Medical Record Number: [id number] Patient Account Number:  [id number] Date of Birth/Sex: Treating RN: 10-09-1986 (37 y.o. Marlan Palau Primary Care Sharnette Kitamura: Belva Agee Other Clinician: Referring Algie Cales: Treating Thurley Francesconi/Extender: Kirkland Hun, Vasilios Weeks in Treatment: 4 Encounter Discharge Information Items Discharge Condition: Stable Ambulatory Status: Ambulatory Discharge Destination: Home Transportation: Private Auto Accompanied By: self Schedule Follow-up Appointment: Yes Clinical Summary of Care: Patient Declined Electronic Signature(s) Signed: 03/18/2023 3:44:06 PM By: Gelene Mink By: Samuella Bruin on 03/18/2023 15:15:58 -------------------------------------------------------------------------------- Lower Extremity Assessment Details Patient Name: Date of Service: Carl Conley, Carl Conley IN S. 03/18/2023 2:45 PM Medical Record Number: [id number] Patient Account Number: [id number] Date of Birth/Sex: Treating RN: 06/03/1986 (37 y.o. Marlan Palau Primary Care Delanna Blacketer: Belva Agee Other Clinician: Referring Jozef Eisenbeis: Treating Shiza Thelen/Extender: Kirkland Hun, Vasilios Weeks in Treatment: 4 Edema Assessment Assessed: [Left: No] [Right: No] Edema: [Left: N] [Right: o] Calf Left: Right: Point of Measurement: From Medial Instep 35 cm Ankle Left: Right: Point of Measurement: From Medial Instep 24.5 cm Electronic Signature(s) Signed: 03/18/2023 3:44:06 PM By: Sol Passer ([id number]) PM By: Mateo Flow.pdf Page 4 of 7 Signed: 03/18/2023 3:44:06 aylor Entered By: Samuella Bruin on 03/18/2023 15:05:31 -------------------------------------------------------------------------------- Multi Wound Chart Details Patient Name: Date of Service: Carl Conley, Carl Conley IN S. 03/18/2023 2:45 PM Medical Record Number: [id number] Patient Account Number: [id number] Date of Birth/Sex: Treating RN: [birth year] (37  y.o. M) Primary Care Porshea Janowski: Belva Agee Other Clinician: Referring Marylin Lathon: Treating Jireh Vinas/Extender: Kirkland Hun, Vasilios Weeks in Treatment: 4 Vital Signs Height(in): 71 Pulse(bpm): 80 Weight(lbs): 185 Blood Pressure(mmHg): 109/75 Body Mass Index(BMI): 25.8 Temperature(F): 98.9 Respiratory Rate(breaths/min): 20 [2:Photos: No Photos Right, Lateral Malleolus Wound Location: Pressure Injury Wounding Event: Diabetic Wound/Ulcer of the Lower Primary Etiology: Extremity Cataracts, Type II Diabetes, Comorbid History: Osteomyelitis, Neuropathy 05/05/2022 Date Acquired: 4 Weeks of  Treatment: Open Wound Status: No Wound Recurrence: 0.1x0.1x0.1 Measurements L x W x D (cm) 0.008 A (cm) : rea 0.001 Volume (cm) :  98.90% % Reduction in A rea: 98.60% % Reduction in Volume: Grade 2 Classification: Medium Exudate A mount: Serosanguineous  Exudate Type: red, brown Exudate Color: Distinct, outline attached Wound Margin: Large (67-100%) Granulation A mount: Red, Pink Granulation Quality: None Present (0%) Necrotic A mount: Fat Layer (Subcutaneous Tissue): Yes N/A Exposed Structures: Fascia:  No Tendon: No Muscle: No Joint: No Bone: No Medium (34-66%) Epithelialization: No Abnormalities Noted Periwound Skin Texture: Dry/Scaly: Yes Periwound Skin Moisture: Maceration: No No Abnormalities Noted Periwound Skin Color:] [N/A:N/A N/A N/A N/A N/A  N/A N/A N/A N/A N/A N/A N/A N/A N/A N/A N/A N/A N/A N/A N/A N/A N/A N/A N/A N/A N/A] Treatment Notes Electronic Signature(s) Signed: 03/18/2023 3:13:03 PM By: Duanne Guess MD FACS Entered By: Duanne Guess on 03/18/2023 15:13:03 [name] ([id number]) [id number] Page 5 of 7 -------------------------------------------------------------------------------- Multi-Disciplinary Care Plan Details Patient Name: Date of Service: Carl Conley, Carl Conley IN S. 03/18/2023 2:45 PM Medical Record Number: [id number] Patient  Account Number: [id number] Date of Birth/Sex: Treating RN: Apr 22, 1986 (37 y.o. Marlan Palau Primary Care Maisha Bogen: Belva Agee Other Clinician: Referring Jessly Lebeck: Treating Keayra Graham/Extender: Kirkland Hun, Vasilios Weeks in Treatment: 4 Active Inactive Electronic Signature(s) Signed: 04/17/2023 12:56:01 PM By: Shawn Stall RN, BSN Signed: 05/07/2023 3:05:56 PM By: Samuella Bruin Previous Signature: 03/18/2023 3:44:06 PM Version By: Samuella Bruin Entered By: Shawn Stall on 04/17/2023 12:56:01 -------------------------------------------------------------------------------- Pain Assessment Details Patient Name: Date of Service: Carl Conley IN S. 03/18/2023 2:45 PM Medical Record Number: [id number] Patient Account Number: [id number] Date of Birth/Sex: Treating RN: 07/05/1986 (37 y.o. M) Primary Care Yalena Colon: Belva Agee Other Clinician: Referring Mirah Nevins: Treating Almus Woodham/Extender: Kirkland Hun, Vasilios Weeks in Treatment: 4 Active Problems Location of Pain Severity and Description of Pain Patient Has Paino No Site Locations Pain Management and Medication Current Pain Management: Electronic Signature(s) Signed: 03/18/2023 3:21:40 PM By: Dayton Scrape Entered By: Dayton Scrape on 03/18/2023 14:51:59 [name] ([id number]) [id number] Page 6 of 7 -------------------------------------------------------------------------------- Patient/Caregiver Education Details Patient Name: Date of Service: Carl Conley, Carl Conley 5/13/2024andnbsp2:45 PM Medical Record Number: [id number] Patient Account Number: [id number] Date of Birth/Gender: Treating RN: 11-30-1985 (37 y.o. Marlan Palau Primary Care Physician: Belva Agee Other Clinician: Referring Physician: Treating Physician/Extender: Kirkland Hun, Vasilios Weeks in Treatment: 4 Education Assessment Education  Provided To: Patient Education Topics Provided Wound/Skin Impairment: Methods: Explain/Verbal Responses: Reinforcements needed, State content correctly Electronic Signature(s) Signed: 03/18/2023 3:44:06 PM By: Samuella Bruin Entered By: Samuella Bruin on 03/18/2023 15:15:42 -------------------------------------------------------------------------------- Wound Assessment Details Patient Name: Date of Service: Carl Conley, Carl Conley IN S. 03/18/2023 2:45 PM Medical Record Number: [id number] Patient Account Number: [id number] Date of Birth/Sex: Treating RN: December 31, 1985 (37 y.o. Marlan Palau Primary Care Tkai Large: Belva Agee Other Clinician: Referring Adithya Difrancesco: Treating Kynlie Jane/Extender: Kirkland Hun, Vasilios Weeks in Treatment: 4 Wound Status Wound Number: 2 Primary Etiology: Diabetic Wound/Ulcer of the Lower Extremity Wound Location: Right, Lateral Malleolus Wound Status: Open Wounding Event: Pressure Injury Comorbid History: Cataracts, Type II Diabetes, Osteomyelitis, Neuropathy Date Acquired: 05/05/2022 Weeks Of Treatment: 4 Clustered Wound: No Wound Measurements Length: (cm) 0.1 Width: (cm) 0.1 Depth: (cm) 0.1 Area: (cm) 0.008 Volume: (cm) 0.001 % Reduction in Area: 98.9% % Reduction in Volume: 98.6% Epithelialization: Medium (34-66%) Tunneling: No Undermining: No Wound Description Classification: Grade 2 Wound Margin: Distinct, outline attached Exudate Amount: Medium Exudate Type: Serosanguineous Exudate Color: red, brown Foul Odor After Cleansing: No Slough/Fibrino No Wound Bed Granulation Amount: Large (67-100%) Exposed Structure Granulation Quality: Red, Pink Fascia Exposed: No Dorce, Amar S (  [id number]) [id number] Page 7 of 7 Necrotic Amount: None Present (0%) Fat Layer (Subcutaneous Tissue) Exposed: Yes Tendon Exposed: No Muscle Exposed: No Joint Exposed: No Bone Exposed: No Periwound Skin  Texture Texture Color No Abnormalities Noted: Yes No Abnormalities Noted: Yes Moisture No Abnormalities Noted: No Dry / Scaly: Yes Maceration: No Electronic Signature(s) Signed: 03/18/2023 3:44:06 PM By: Samuella Bruin Entered By: Samuella Bruin on 03/18/2023 15:09:29 -------------------------------------------------------------------------------- Vitals Details Patient Name: Date of Service: Carl Conley IN S. 03/18/2023 2:45 PM Medical Record Number: [id number] Patient Account Number: [id number] Date of Birth/Sex: Treating RN: 1986-10-06 (37 y.o. M) Primary Care Aleen Marston: Belva Agee Other Clinician: Referring Tejasvi Brissett: Treating Rily Nickey/Extender: Kirkland Hun, Vasilios Weeks in Treatment: 4 Vital Signs Time Taken: 02:51 Temperature (F): 98.9 Height (in): 71 Pulse (bpm): 80 Weight (lbs): 185 Respiratory Rate (breaths/min): 20 Body Mass Index (BMI): 25.8 Blood Pressure (mmHg): 109/75 Reference Range: 80 - 120 mg / dl Electronic Signature(s) Signed: 03/18/2023 3:21:40 PM By: Dayton Scrape Entered By: Dayton Scrape on 03/18/2023 14:51:52

## 2023-07-23 ENCOUNTER — Encounter: Payer: Medicaid Other | Admitting: Student

## 2023-08-11 NOTE — Progress Notes (Unsigned)
CC: Diabetes follow-up.  HPI:  [name] is a 37 y.o. male living with a history stated below and presents today for diabetes follow-up.Marland Kitchen Please see problem based assessment and plan for additional details.  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     Bacitracin-Polymyxin B (NEOSPORIN EX) Apply 1 Application topically daily as needed (antibiotic ointment). (Patient not taking: Reported on 12/18/2022)     blood glucose meter kit and supplies KIT Use up to four times daily as directed. (Patient not taking: Reported on 01/31/2023) 1 each 0   citalopram (CELEXA) 20 MG tablet TAKE 1 TABLET BY MOUTH EVERY DAY (Patient taking differently: Take 20 mg by mouth daily.) 90 tablet 2   Continuous Blood Gluc Receiver (FREESTYLE LIBRE 2 READER) DEVI 1 Device by Does not apply route daily. 1 each 0   Continuous Blood Gluc Sensor (FREESTYLE LIBRE 2 SENSOR) MISC Please replace sensor every 14 days 9 each 1   glucose blood test strip use to test blood sugar four times daily (Patient not taking: Reported on 01/31/2023) 100 each 0   insulin glargine (LANTUS) 100 unit/mL SOPN Inject 20 Units into the skin at bedtime. 6 mL 11   Insulin Pen Needle 32G X 4 MM MISC 1 Needle by Does not apply route daily. 100 each 2   metFORMIN (GLUCOPHAGE-XR) 500 MG 24 hr tablet Take 4 tablets (2,000 mg total) by mouth daily with breakfast. 180 tablet 3   rosuvastatin (CRESTOR) 20 MG tablet Take 1 tablet (20 mg total) by mouth daily. (Patient not taking:  Reported on 01/31/2023) 90 tablet 1   timolol (TIMOPTIC) 0.5 % ophthalmic solution Place 1 drop into the right eye every morning.     VITAMIN D PO Take 1 tablet by mouth daily. (Patient not taking: Reported on 01/31/2023)     No current facility-administered medications on file prior to visit.     Review of Systems: ROS negative except for what is noted on the assessment and plan.  Vitals:   08/12/23 1433  BP: 103/64  Pulse: 79  Temp: 98.3 F (36.8 C)  TempSrc: Oral  SpO2: 100%  Weight: 255 lb 12.8 oz (116 kg)    Physical Exam: Constitutional: Well appearing, not in acute distress. Cardiovascular: regular rate and rhythm, no m/r/g Pulmonary/Chest: normal work of breathing on room air, lungs clear to auscultation bilaterally Abdominal: soft, non-tender, non-distended Skin: Ulcer on the plantar surface of the big toe, without erythema or drainage.  There is also an ulcer on the right lateral malleolus without drainage.  Assessment & Plan:   Diabetic ulcer of toe of right foot associated with type 2 diabetes mellitus (HCC) Reporting a 1 month history of a chronic ulcer on the plantar surface of the big toe.  He has applied Neosporin and wound dressing which has helped somewhat.  He denies any recent trauma.  On my exam,there is an open wound on the  big toe, without erythema or drainage.  There is also a right lateral malleolar ulcer that has been present for about 2 years.  Patient also has non itchy bilateral pedal dermatitis, declined steroid cream for treatment.  He is a chronic skin picker, and he has an open wound on the leg.  -Refer to podiatry placed -Commend doing good footwear  Flat affect He has a flat affect, and depressed mood.  He does have a history of learning disability and is wondering if he has a diagnosis of autism.  -Referral to behavioral health placed.  Diabetes (HCC) A1c 6.2 today.Marland Kitchen  He is currently taking metformin 2 g daily.  No longer taking Lantus.  In  previous times, SGLT 2 inhibitors were discontinued due to softer blood pressures , and he is also opposed to starting GLP 1 and due to concern that he may not be able to come off of it.  -Referral to ophthalmology for diabetic eye exam placed..   -Continue metformin 2 g daily -Lab in 3 months.   Patient seen with Dr. Lowry Ram, MD [facility] Internal Medicine, PGY-1 Phone: [phone] Date 08/12/2023 Time 3:53 PM

## 2023-08-12 ENCOUNTER — Encounter: Payer: Self-pay | Admitting: Student

## 2023-08-12 ENCOUNTER — Ambulatory Visit: Payer: Medicaid Other | Admitting: Student

## 2023-08-12 VITALS — BP 103/64 | HR 79 | Temp 98.3°F | Wt 255.8 lb

## 2023-08-12 DIAGNOSIS — E1159 Type 2 diabetes mellitus with other circulatory complications: Secondary | ICD-10-CM

## 2023-08-12 DIAGNOSIS — L97519 Non-pressure chronic ulcer of other part of right foot with unspecified severity: Secondary | ICD-10-CM | POA: Diagnosis not present

## 2023-08-12 DIAGNOSIS — Z23 Encounter for immunization: Secondary | ICD-10-CM

## 2023-08-12 DIAGNOSIS — R4589 Other symptoms and signs involving emotional state: Secondary | ICD-10-CM | POA: Insufficient documentation

## 2023-08-12 DIAGNOSIS — Z7984 Long term (current) use of oral hypoglycemic drugs: Secondary | ICD-10-CM

## 2023-08-12 DIAGNOSIS — E11621 Type 2 diabetes mellitus with foot ulcer: Secondary | ICD-10-CM | POA: Diagnosis present

## 2023-08-12 LAB — POCT GLYCOSYLATED HEMOGLOBIN (HGB A1C): Hemoglobin A1C: 6.2 % — AB (ref 4.0–5.6)

## 2023-08-12 LAB — GLUCOSE, CAPILLARY: Glucose-Capillary: 105 mg/dL — ABNORMAL HIGH (ref 70–99)

## 2023-08-12 NOTE — Assessment & Plan Note (Signed)
He has a flat affect, and depressed mood.  He does have a history of learning disability and is wondering if he has a diagnosis of autism.  -Referral to behavioral health placed.

## 2023-08-12 NOTE — Assessment & Plan Note (Signed)
A1c 6.2 today.Marland Kitchen  He is currently taking metformin 2 g daily.  No longer taking Lantus.  In previous times, SGLT 2 inhibitors were discontinued due to softer blood pressures , and he is also opposed to starting GLP 1 and due to concern that he may not be able to come off of it.  -Referral to ophthalmology for diabetic eye exam placed..   -Continue metformin 2 g daily -Lab in 3 months.

## 2023-08-12 NOTE — Patient Instructions (Addendum)
Thank you, Fransisco Beau for allowing [place] to provide your care today.   Today we discussed :  1.  For diabetes, continue with   metformin 2 g daily.  2.  Have referred you to podiatry to help with the wound on the right big toe   3 I have also referred you to ophthalmology for your yearly eye screening.   I have ordered the following labs for you:  Lab Orders         Glucose, capillary         POC Hbg A1C       Referrals ordered today:   Referral Orders         Ambulatory referral to Ophthalmology       I have ordered the following medication/changed the following medications:   Stop the following medications: There are no discontinued medications.   Start the following medications: No orders of the defined types were placed in this encounter.    Follow up: 3 months    Should you have any questions or concerns please call the internal medicine clinic at [phone].    Laretta Bolster, MD  [facility]

## 2023-08-12 NOTE — Assessment & Plan Note (Signed)
Reporting a 1 month history of a chronic ulcer on the plantar surface of the big toe.  He has applied Neosporin and wound dressing which has helped somewhat.  He denies any recent trauma.  On my exam,there is an open wound on the big toe, without erythema or drainage.  There is also a right lateral malleolar ulcer that has been present for about 2 years.  Patient also has non itchy bilateral pedal dermatitis, declined steroid cream for treatment.  He is a chronic skin picker, and he has an open wound on the leg.  -Refer to podiatry placed -Commend doing good footwear

## 2023-08-13 ENCOUNTER — Encounter: Payer: Self-pay | Admitting: Student

## 2023-08-13 LAB — BASIC METABOLIC PANEL
BUN/Creatinine Ratio: 14 (ref 9–20)
BUN: 28 mg/dL — ABNORMAL HIGH (ref 6–20)
CO2: 18 mmol/L — ABNORMAL LOW (ref 20–29)
Calcium: 9.9 mg/dL (ref 8.7–10.2)
Chloride: 102 mmol/L (ref 96–106)
Creatinine, Ser: 2.06 mg/dL — ABNORMAL HIGH (ref 0.76–1.27)
Glucose: 92 mg/dL (ref 70–99)
Potassium: 5 mmol/L (ref 3.5–5.2)
Sodium: 137 mmol/L (ref 134–144)
eGFR: 42 mL/min/{1.73_m2} — ABNORMAL LOW (ref >59)

## 2023-08-13 NOTE — Addendum Note (Signed)
Addended by: Laretta Bolster on: 08/13/2023 04:17 PM   Modules accepted: Orders

## 2023-08-19 ENCOUNTER — Ambulatory Visit (HOSPITAL_COMMUNITY)
Admission: RE | Admit: 2023-08-19 | Discharge: 2023-08-19 | Disposition: A | Discharge status: Home / Self Care | Payer: Medicaid Other | Source: Ambulatory Visit | Attending: Internal Medicine | Admitting: Internal Medicine

## 2023-08-19 ENCOUNTER — Other Ambulatory Visit: Payer: Medicaid Other

## 2023-08-19 DIAGNOSIS — R7989 Other specified abnormal findings of blood chemistry: Secondary | ICD-10-CM

## 2023-08-19 DIAGNOSIS — E1159 Type 2 diabetes mellitus with other circulatory complications: Secondary | ICD-10-CM | POA: Diagnosis present

## 2023-08-19 NOTE — Progress Notes (Signed)
Laretta Bolster, MD 08/13/2023  4:10 PM EDT Back to Top    Creatinine elevated to 2.06. BUN/Cr<20. Workup includes holding metformin, rechecking BMP on Monday October 14.  I will also get a UA and a renal ultrasound.  Patient notified of the lab results.  He is open to coming on Monday to get his blood work and imaging studies completed.    Pt here for lab only after receiving above message from MD. Pt states he has been holding his metformin since Monday.  Pt also provided urine for UA and a renal ultrasound was also completed today.

## 2023-08-20 LAB — URINALYSIS, COMPLETE
Bilirubin, UA: NEGATIVE
Ketones, UA: NEGATIVE
Leukocytes,UA: NEGATIVE
Nitrite, UA: NEGATIVE
Protein,UA: NEGATIVE
RBC, UA: NEGATIVE
Specific Gravity, UA: 1.009 (ref 1.005–1.030)
Urobilinogen, Ur: 0.2 mg/dL (ref 0.2–1.0)
pH, UA: 6 (ref 5.0–7.5)

## 2023-08-20 LAB — BMP8+ANION GAP
Anion Gap: 16 mmol/L (ref 10.0–18.0)
BUN/Creatinine Ratio: 11 (ref 9–20)
BUN: 20 mg/dL (ref 6–20)
CO2: 21 mmol/L (ref 20–29)
Calcium: 9.9 mg/dL (ref 8.7–10.2)
Chloride: 99 mmol/L (ref 96–106)
Creatinine, Ser: 1.87 mg/dL — ABNORMAL HIGH (ref 0.76–1.27)
Glucose: 198 mg/dL — ABNORMAL HIGH (ref 70–99)
Potassium: 4.9 mmol/L (ref 3.5–5.2)
Sodium: 136 mmol/L (ref 134–144)
eGFR: 47 mL/min/{1.73_m2} — ABNORMAL LOW (ref >59)

## 2023-08-20 LAB — MICROSCOPIC EXAMINATION
Bacteria, UA: NONE SEEN
Casts: NONE SEEN /[LPF]
Epithelial Cells (non renal): NONE SEEN /[HPF] (ref 0–10)
RBC, Urine: NONE SEEN /[HPF] (ref 0–2)
WBC, UA: NONE SEEN /[HPF] (ref 0–5)

## 2023-08-21 ENCOUNTER — Ambulatory Visit: Payer: Medicaid Other | Admitting: Podiatry

## 2023-08-28 ENCOUNTER — Ambulatory Visit: Payer: Medicaid Other | Admitting: Podiatry

## 2023-08-28 NOTE — Progress Notes (Signed)
Internal Medicine Clinic Attending  I was physically present during the key portions of the resident provided service and participated in the medical decision making of patient's management care. I reviewed pertinent patient test results.  The assessment, diagnosis, and plan were formulated together and I agree with the documentation in the resident's note.  Concern today about the chronicity of a R lateral malleolus ulcer. Skin lesions and scabs are frequently picked, inhibiting healing.  Also of note is low normal BP which should be monitored. Asymptomatic at this time.  Miguel Aschoff, MD

## 2023-09-04 ENCOUNTER — Ambulatory Visit: Payer: Medicaid Other | Admitting: Podiatry

## 2023-09-04 ENCOUNTER — Telehealth: Payer: Self-pay | Admitting: Podiatry

## 2023-09-04 NOTE — Telephone Encounter (Signed)
Pt called and he had an appt today but his transportation never showed up. He stated most of the skin had already come off. He was sending you a picture thru my chart to see if you think he needed to be seen.

## 2023-09-05 NOTE — Telephone Encounter (Signed)
Lvm for pt that Dr Allena Katz has not received any pictures yet but he said it seems like it would be ok for you not to be seen at the moment but if anything changes please call and we can get him scheduled to come in.

## 2023-09-12 ENCOUNTER — Other Ambulatory Visit: Payer: Medicaid Other

## 2023-10-23 ENCOUNTER — Encounter: Payer: Self-pay | Admitting: Student

## 2023-11-14 ENCOUNTER — Encounter: Payer: Medicaid Other | Admitting: Student

## 2023-11-19 ENCOUNTER — Other Ambulatory Visit: Payer: Self-pay

## 2023-11-19 DIAGNOSIS — F32A Depression, unspecified: Secondary | ICD-10-CM

## 2023-11-19 DIAGNOSIS — E1159 Type 2 diabetes mellitus with other circulatory complications: Secondary | ICD-10-CM

## 2023-11-20 MED ORDER — METFORMIN HCL ER 500 MG PO TB24
2000.0000 mg | ORAL_TABLET | Freq: Every day | ORAL | 3 refills | Status: DC
Start: 2023-11-20 — End: 2024-04-08

## 2023-11-20 MED ORDER — CITALOPRAM HYDROBROMIDE 20 MG PO TABS
20.0000 mg | ORAL_TABLET | Freq: Every day | ORAL | 2 refills | Status: DC
Start: 2023-11-20 — End: 2024-06-15

## 2023-11-28 ENCOUNTER — Encounter: Payer: Medicaid Other | Admitting: Student

## 2023-12-05 ENCOUNTER — Telehealth: Payer: Self-pay | Admitting: Student
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
## 2023-12-06 ENCOUNTER — Other Ambulatory Visit: Payer: Self-pay | Admitting: Student

## 2023-12-06 DIAGNOSIS — Z139 Encounter for screening, unspecified: Secondary | ICD-10-CM

## 2023-12-09 NOTE — Progress Notes (Addendum)
 Established Patient Office Visit  Subjective   Patient ID: Carl Conley, male    DOB: Nov 24, 1985  Age: 38 y.o. MRN: [id number]  Chief Complaint  Patient presents with   Medical Management of Chronic Issues   Foot Ulcer   Immunizations    Will get a Prevnar 20 vaccine    Carl Conley is a 38 y.o. who presents to the clinic for a three month follow up of T2DM. Please see problem based assessment and plan for additional details.  Patient Active Problem List   Diagnosis Date Noted   Diabetic ulcer of Left foot, limited to breakdown of skin (HCC) 12/10/2023   Diabetic ulcer of toe of right foot associated with type 2 diabetes mellitus (HCC) 08/12/2023   Flat affect 08/12/2023   Acute kidney injury (HCC) 02/07/2023   Anemia 02/07/2023   History of transmetatarsal amputation of foot (HCC) 01/27/2023   Amputation of toe of right foot (HCC) 01/27/2023   Dependent for transportation 01/14/2023   Low blood pressure 01/14/2023   Encounter for screening involving social determinants of health (SDoH) 12/13/2022   Pressure injury of skin 12/03/2022   Diabetic osteomyelitis (HCC) 11/01/2022   Healthcare maintenance 07/17/2021   Seborrheic dermatitis 11/11/2020   NAFLD (nonalcoholic fatty liver disease) 94/72/7979   ADHD    Depression 10/31/2016   Diabetes (HCC) 04/13/2016       Objective:     BP 115/69 (BP Location: Right Arm, Patient Position: Sitting, Cuff Size: Normal)   Pulse 87   Temp 99.4 F (37.4 C) (Oral)   Ht 5' 11 (1.803 m)   Wt 249 lb 8 oz (113.2 kg)   SpO2 100%   BMI 34.80 kg/m  BP Readings from Last 3 Encounters:  12/10/23 115/69  08/12/23 103/64  03/07/23 96/60   Wt Readings from Last 3 Encounters:  12/10/23 249 lb 8 oz (113.2 kg)  08/12/23 255 lb 12.8 oz (116 kg)  03/07/23 234 lb 1.6 oz (106.2 kg)      Physical Exam Vitals reviewed.  Constitutional:      General: He is not in acute distress.    Appearance: He is obese. He is not  ill-appearing, toxic-appearing or diaphoretic.  Cardiovascular:     Rate and Rhythm: Normal rate and regular rhythm.     Pulses:          Dorsalis pedis pulses are 2+ on the right side and 2+ on the left side.       Posterior tibial pulses are 2+ on the right side and 2+ on the left side.     Heart sounds: No murmur heard. Abdominal:     General: Bowel sounds are normal. There is no distension.     Palpations: Abdomen is soft.     Tenderness: There is no abdominal tenderness. There is no guarding.  Musculoskeletal:     Right lower leg: No edema.     Left lower leg: No edema.     Right foot: No tenderness.     Left foot: No tenderness.     Comments: Diabetic ulcers present on bilateral feet.  Please see images below  Skin:    General: Skin is warm and dry.  Neurological:     Mental Status: He is alert.  Psychiatric:        Mood and Affect: Mood normal. Affect is flat.        Thought Content: Thought content normal. Thought content does not include homicidal or suicidal  ideation. Thought content does not include homicidal or suicidal plan.         Results for orders placed or performed in visit on 12/10/23  Glucose, capillary  Result Value Ref Range   Glucose-Capillary 140 (H) 70 - 99 mg/dL  POC Hbg J8R  Result Value Ref Range   Hemoglobin A1C 6.2 (A) 4.0 - 5.6 %   HbA1c POC (<> result, manual entry)     HbA1c, POC (prediabetic range)     HbA1c, POC (controlled diabetic range)      Last metabolic panel Lab Results  Component Value Date   GLUCOSE 198 (H) 08/19/2023   NA 136 08/19/2023   K 4.9 08/19/2023   CL 99 08/19/2023   CO2 21 08/19/2023   BUN 20 08/19/2023   CREATININE 1.87 (H) 08/19/2023   EGFR 47 (L) 08/19/2023   CALCIUM  9.9 08/19/2023   PROT 6.9 11/27/2022   ALBUMIN 2.7 (L) 11/27/2022   LABGLOB 2.5 07/17/2021   AGRATIO 2.0 07/17/2021   BILITOT 0.3 11/27/2022   ALKPHOS 49 11/27/2022   AST 17 11/27/2022   ALT 16 11/27/2022   ANIONGAP 7 11/27/2022    Last lipids Lab Results  Component Value Date   CHOL 199 11/27/2022   HDL 21 (L) 11/27/2022   LDLCALC 141 (H) 11/27/2022   TRIG 185 (H) 11/27/2022   CHOLHDL 9.5 11/27/2022   Last hemoglobin A1c Lab Results  Component Value Date   HGBA1C 6.2 (A) 12/10/2023     The ASCVD Risk score (Arnett DK, et al., 2019) failed to calculate for the following reasons:   The 2019 ASCVD risk score is only valid for ages 41 to 70    Assessment & Plan:   Problem List Items Addressed This Visit       Endocrine   Diabetes [facility]) - Primary   Patient presents with a history of T2DM with a prior A1c of 6.2 in 08/2023.  His T2DM is well controlled with an A1c today is 6.2.  He is on a regimen of metformin  2,000mg . He denies adverse effects of metformin .  Patient denies symptoms of hypoglycemia.    Plan: -Continue regimen of metformin  2,000mg  daily, check BMP for dosing  -A1c checked today -Ophthalmology referral: saw Dr. Octavia recently, will need records -Urine ACR  UTD      Relevant Orders   POC Hbg A1C (Completed)   Basic metabolic panel   Diabetic ulcer of toe of right foot associated with type 2 diabetes mellitus (HCC)   Patient reports at least a 56-month history of a diabetic ulcer of his right toe on the plantar surface.  Patient has continued to try Neosporin and gauze wound dressing.  Patient denies any traumatic injury to cause this ulceration or pain.  He denies any drainage from the site as well.  Physical exam reveals a 1 x 1 cm ulceration with skin necrosis.  There is no drainage on physical exam or purulence.  I have reached out to patient's podiatrist to set up an appointment and to start interventions at his appointment. Plan: -Right foot x-ray ordered to evaluate for any foreign body -Mupirocin  ointment ordered for twice daily application -Patient will schedule podiatry appointment for this week      Relevant Orders   DG Foot Complete Right   Diabetic ulcer of Left foot,  limited to breakdown of skin (HCC)   Patient reports a 2-3 week history of a diabetic ulcer of his left foot on the plantar surface  proximal to the transmetatarsal amputation site.  Patient has tried Neosporin and gauze wound dressing.  He currently denies pain at the site of the ulceration.  He does report fluid that was described as serosanguineous.  Physical exam reveals a 2 x 3 cm ulceration without evidence of necrosis or purulence.  I am unable to visualize underlying bone. I have reached out to patient's podiatrist to set up an appointment and to start interventions at his appointment. Plan: -Left foot x-ray ordered to evaluate for any foreign body -Mupirocin  ointment ordered for twice daily application -Patient will schedule podiatry appointment for this week -Patient was instructed to call the clinic if he develops systemic symptoms of infection such as fever, chill, lethargy; will hold off on systemic antibiotics until patient sees podiatrist or develops systemic symptoms      Relevant Orders   DG Foot Complete Left   DG Foot Complete Right     Other   Depression   Patient has a history of depression.  A PHQ-9 for today was 14 revealing moderate depression.  Patient reported passive thoughts of suicide over 1 week ago that have resolved.  He denies passive thoughts of suicide today or plans of site suicidal or homicidal ideations.  I suspect that the patient's active depression is due to stressors at home.  Patient reported concerns of becoming unhomed and does not currently have an income.  Plan: -Continue citalopram  20 mg -Referral sent for counseling -Patient encouraged to contact his social worker today      Relevant Orders   Ambulatory referral to Integrated Behavioral Health   Other Visit Diagnoses       Encounter for Prevnar pneumococcal vaccination       Relevant Orders   Pneumococcal conjugate vaccine 20-valent (Prevnar 20) (Completed)       Return in about 3  months (around 03/08/2024) for DM.    Damien Lease, DO

## 2023-12-10 ENCOUNTER — Ambulatory Visit (HOSPITAL_COMMUNITY)
Admission: RE | Admit: 2023-12-10 | Discharge: 2023-12-10 | Disposition: A | Discharge status: Home / Self Care | Payer: Medicaid Other | Source: Ambulatory Visit | Attending: Student in an Organized Health Care Education/Training Program | Admitting: Student in an Organized Health Care Education/Training Program

## 2023-12-10 ENCOUNTER — Ambulatory Visit: Payer: Medicaid Other | Admitting: Student

## 2023-12-10 ENCOUNTER — Telehealth: Payer: Self-pay | Admitting: Podiatry

## 2023-12-10 VITALS — BP 115/69 | HR 87 | Temp 99.4°F | Ht 71.0 in | Wt 249.5 lb

## 2023-12-10 DIAGNOSIS — F321 Major depressive disorder, single episode, moderate: Secondary | ICD-10-CM | POA: Diagnosis not present

## 2023-12-10 DIAGNOSIS — E1159 Type 2 diabetes mellitus with other circulatory complications: Secondary | ICD-10-CM

## 2023-12-10 DIAGNOSIS — L97421 Non-pressure chronic ulcer of left heel and midfoot limited to breakdown of skin: Secondary | ICD-10-CM | POA: Diagnosis present

## 2023-12-10 DIAGNOSIS — E08621 Diabetes mellitus due to underlying condition with foot ulcer: Secondary | ICD-10-CM | POA: Insufficient documentation

## 2023-12-10 DIAGNOSIS — Z23 Encounter for immunization: Secondary | ICD-10-CM

## 2023-12-10 DIAGNOSIS — Z7984 Long term (current) use of oral hypoglycemic drugs: Secondary | ICD-10-CM

## 2023-12-10 DIAGNOSIS — E11621 Type 2 diabetes mellitus with foot ulcer: Secondary | ICD-10-CM | POA: Diagnosis present

## 2023-12-10 DIAGNOSIS — L97511 Non-pressure chronic ulcer of other part of right foot limited to breakdown of skin: Secondary | ICD-10-CM | POA: Insufficient documentation

## 2023-12-10 LAB — GLUCOSE, CAPILLARY: Glucose-Capillary: 140 mg/dL — ABNORMAL HIGH (ref 70–99)

## 2023-12-10 LAB — POCT GLYCOSYLATED HEMOGLOBIN (HGB A1C): Hemoglobin A1C: 6.2 % — AB (ref 4.0–5.6)

## 2023-12-10 MED ORDER — MUPIROCIN 2 % EX OINT: 1.0000 | TOPICAL_OINTMENT | Freq: Two times a day (BID) | CUTANEOUS | 1 refills | Status: DC

## 2023-12-10 NOTE — Patient Instructions (Signed)
 Thank you, Lorry GORMAN Glatter for allowing us  to provide your care today. Today we discussed diabetes, depression, and ulcerations of your feet.  Please call your podiatrist today and schedule an appointment ASAP. If you begin to experience fevers, chills, or feeling unwell; please call the clinic and we can send you in antibiotics. The best thing you can do for your ulcers, is to call the podiatrist.   I have ordered the following labs for you:   Lab Orders         Glucose, capillary         Basic metabolic panel         POC Hbg A1C        I have ordered the following medication/changed the following medications:   Stop the following medications: Medications Discontinued During This Encounter  Medication Reason   insulin  glargine (LANTUS ) 100 unit/mL SOPN Patient has not taken in last 30 days     Start the following medications: No orders of the defined types were placed in this encounter.    Follow up: 3 months DM   Remember: To call the podiatrist.   Should you have any questions or concerns please call the internal medicine clinic at [phone].     Please note that our late policy has changed.  If you are more than 15 minutes late to your appointment, you may be asked to reschedule your appointment.  Dr. Kandis, D.O. [facility] Internal Medicine Center

## 2023-12-10 NOTE — Assessment & Plan Note (Signed)
 Patient reports at least a 62-month history of a diabetic ulcer of his right toe on the plantar surface.  Patient has continued to try Neosporin and gauze wound dressing.  Patient denies any traumatic injury to cause this ulceration or pain.  He denies any drainage from the site as well.  Physical exam reveals a 1 x 1 cm ulceration with skin necrosis.  There is no drainage on physical exam or purulence.  I have reached out to patient's podiatrist to set up an appointment and to start interventions at his appointment. Plan: -Right foot x-ray ordered to evaluate for any foreign body -Mupirocin  ointment ordered for twice daily application -Patient will schedule podiatry appointment for this week

## 2023-12-10 NOTE — Assessment & Plan Note (Signed)
 Patient has a history of depression.  A PHQ-9 for today was 14 revealing moderate depression.  Patient reported passive thoughts of suicide over 1 week ago that have resolved.  He denies passive thoughts of suicide today or plans of site suicidal or homicidal ideations.  I suspect that the patient's active depression is due to stressors at home.  Patient reported concerns of becoming unhomed and does not currently have an income.  Plan: -Continue citalopram  20 mg -Referral sent for counseling -Patient encouraged to contact his social worker today

## 2023-12-10 NOTE — Assessment & Plan Note (Signed)
 Patient presents with a history of T2DM with a prior A1c of 6.2 in 08/2023.  His T2DM is well controlled with an A1c today is 6.2.  He is on a regimen of metformin  2,000mg . He denies adverse effects of metformin .  Patient denies symptoms of hypoglycemia.    Plan: -Continue regimen of metformin  2,000mg  daily, check BMP for dosing  -A1c checked today -Ophthalmology referral: saw Dr. Octavia recently, will need records -Urine ACR  UTD

## 2023-12-10 NOTE — Assessment & Plan Note (Signed)
 Patient reports a 2-3 week history of a diabetic ulcer of his left foot on the plantar surface proximal to the transmetatarsal amputation site.  Patient has tried Neosporin and gauze wound dressing.  He currently denies pain at the site of the ulceration.  He does report fluid that was described as serosanguineous.  Physical exam reveals a 2 x 3 cm ulceration without evidence of necrosis or purulence.  I am unable to visualize underlying bone. I have reached out to patient's podiatrist to set up an appointment and to start interventions at his appointment. Plan: -Left foot x-ray ordered to evaluate for any foreign body -Mupirocin  ointment ordered for twice daily application -Patient will schedule podiatry appointment for this week -Patient was instructed to call the clinic if he develops systemic symptoms of infection such as fever, chill, lethargy; will hold off on systemic antibiotics until patient sees podiatrist or develops systemic symptoms

## 2023-12-10 NOTE — Telephone Encounter (Signed)
Left message for pt to call to schedule an appt asap per Dr Hessie Diener and Dr Annamary Rummage. He has seen Dr Allena Katz previously and I was going to try to get him in tomorrow 2/5 if he is available

## 2023-12-11 ENCOUNTER — Other Ambulatory Visit: Payer: Self-pay | Admitting: Student

## 2023-12-11 ENCOUNTER — Ambulatory Visit: Payer: Medicaid Other | Admitting: Podiatry

## 2023-12-11 DIAGNOSIS — E875 Hyperkalemia: Secondary | ICD-10-CM

## 2023-12-11 LAB — BASIC METABOLIC PANEL
BUN/Creatinine Ratio: 12 (ref 9–20)
BUN: 27 mg/dL — ABNORMAL HIGH (ref 6–20)
CO2: 19 mmol/L — ABNORMAL LOW (ref 20–29)
Calcium: 10 mg/dL (ref 8.7–10.2)
Chloride: 97 mmol/L (ref 96–106)
Creatinine, Ser: 2.18 mg/dL — ABNORMAL HIGH (ref 0.76–1.27)
Glucose: 118 mg/dL — ABNORMAL HIGH (ref 70–99)
Potassium: 5.4 mmol/L — ABNORMAL HIGH (ref 3.5–5.2)
Sodium: 132 mmol/L — ABNORMAL LOW (ref 134–144)
eGFR: 39 mL/min/{1.73_m2} — ABNORMAL LOW (ref >59)

## 2023-12-11 MED ORDER — AMOXICILLIN-POT CLAVULANATE 875-125 MG PO TABS: 1.0000 | ORAL_TABLET | Freq: Two times a day (BID) | ORAL | 0 refills | Status: DC

## 2023-12-11 MED ORDER — LOKELMA 10 G PO PACK: 10.0000 g | PACK | Freq: Once | ORAL | 0 refills | Status: AC

## 2023-12-11 MED ORDER — CEPHALEXIN 500 MG PO CAPS: 500.0000 mg | ORAL_CAPSULE | Freq: Four times a day (QID) | ORAL | 0 refills | Status: DC

## 2023-12-11 NOTE — Progress Notes (Signed)
 Internal Medicine Clinic Attending  Case discussed with the resident physician at the time of the visit.  We reviewed the patient's history, exam, and pertinent patient test results.  I agree with the assessment, diagnosis, and plan of care documented in the resident's note.

## 2023-12-17 ENCOUNTER — Ambulatory Visit (INDEPENDENT_AMBULATORY_CARE_PROVIDER_SITE_OTHER): Payer: Medicaid Other | Admitting: Licensed Clinical Social Worker

## 2023-12-17 ENCOUNTER — Encounter: Payer: Medicaid Other | Admitting: Student

## 2023-12-17 DIAGNOSIS — F321 Major depressive disorder, single episode, moderate: Secondary | ICD-10-CM

## 2023-12-17 NOTE — BH Specialist Note (Signed)
Patient no-showed today's appointment; appointment was for Telephone visit at 2:00pm  Behavioral Health Clinician [facility] from here on out)  attempted patient via Telephone number [phone]    [facility] contacted patient from telephone number [phone]. [facility] left a  VM.   Patient will need to reschedule appointment by calling Internal medicine center [phone].  Christen Butter, MSW, LCSW-A She/Her Behavioral Health Clinician [facility]  Internal Medicine Center Direct [phone]  Fax [phone] Main Office Phone: [phone] [address] [employer] [place], [place] [address] Website: [facility] Internal Medicine [facility]  [place], [place]  [place] Solis

## 2023-12-18 ENCOUNTER — Telehealth: Payer: Self-pay | Admitting: Podiatry

## 2023-12-18 ENCOUNTER — Ambulatory Visit (INDEPENDENT_AMBULATORY_CARE_PROVIDER_SITE_OTHER): Payer: Medicaid Other | Admitting: Podiatry

## 2023-12-18 DIAGNOSIS — L97522 Non-pressure chronic ulcer of other part of left foot with fat layer exposed: Secondary | ICD-10-CM | POA: Diagnosis not present

## 2023-12-18 DIAGNOSIS — Z89431 Acquired absence of right foot: Secondary | ICD-10-CM

## 2023-12-18 NOTE — Telephone Encounter (Signed)
Called pt to resched appt. He needs to see Dr. Jamse Arn 2-3 weeks from today. He said he will call back to resched.

## 2023-12-18 NOTE — Progress Notes (Signed)
 Subjective:  Patient ID: Carl Conley, male    DOB: April 22, 1986,  MRN: [id number]  Chief Complaint  Patient presents with   Foot Ulcer    38 y.o. male presents with the above complaint.  Patient presents with left TMA wound dehiscence of the right hallux IPJ ulceration.  Patient states been present for quite some time he is a diabetic Badik type I will get diagnosed at age of 18.  He wanted to get it evaluated has been dealing with this wound for quite some time.  His last A1c is 6.2 he has not seen anyone else prior to seeing me denies any other acute complaints.   Review of Systems: Negative except as noted in the HPI. Denies N/V/F/Ch.  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   Depression 10/31/2016   Diabetes mellitus (HCC)    dx date 2004 at age 30   Low back pain 09/01/2016   Visual snow syndrome     Current Outpatient Medications:    Accu-Chek Softclix Lancets lancets, use to test blood sugar four times daily (Patient not taking: Reported on 01/31/2023), Disp: 100 each, Rfl: 0   acyclovir (ZOVIRAX) 400 MG tablet, Take 400 mg by mouth 2 (two) times daily., Disp: , Rfl:    amoxicillin-clavulanate (AUGMENTIN) 875-125 MG tablet, Take 1 tablet by mouth 2 (two) times daily., Disp: 20 tablet, Rfl: 0   Bacitracin-Polymyxin B (NEOSPORIN EX), Apply 1 Application topically daily as needed (antibiotic ointment). (Patient not taking: Reported on 12/18/2022), Disp: , Rfl:    blood glucose meter kit and supplies KIT, Use up to four times daily as directed. (Patient not taking: Reported on 01/31/2023), Disp: 1 each, Rfl: 0   citalopram (CELEXA) 20 MG tablet, Take 1 tablet (20 mg total) by mouth daily., Disp: 90 tablet, Rfl: 2   Continuous Blood Gluc Receiver (FREESTYLE LIBRE 2 READER) DEVI, 1 Device by Does not apply route daily., Disp: 1 each, Rfl: 0   Continuous Blood Gluc Sensor (FREESTYLE LIBRE 2 SENSOR) MISC, Please replace sensor every 14  days, Disp: 9 each, Rfl: 1   glucose blood test strip, use to test blood sugar four times daily (Patient not taking: Reported on 01/31/2023), Disp: 100 each, Rfl: 0   Insulin Pen Needle 32G X 4 MM MISC, 1 Needle by Does not apply route daily., Disp: 100 each, Rfl: 2   metFORMIN (GLUCOPHAGE-XR) 500 MG 24 hr tablet, Take 4 tablets (2,000 mg total) by mouth daily with breakfast., Disp: 180 tablet, Rfl: 3   mupirocin ointment (BACTROBAN) 2 %, Apply 1 Application topically 2 (two) times daily., Disp: 22 g, Rfl: 1   rosuvastatin (CRESTOR) 20 MG tablet, Take 1 tablet (20 mg total) by mouth daily. (Patient not taking: Reported on 01/31/2023), Disp: 90 tablet, Rfl: 1   timolol (TIMOPTIC) 0.5 % ophthalmic solution, Place 1 drop into the right eye every morning., Disp: , Rfl:    VITAMIN D PO, Take 1 tablet by mouth daily. (Patient not taking: Reported on 01/31/2023), Disp: , Rfl:   Social History   Tobacco Use  Smoking Status Never  Smokeless Tobacco Never    Allergies  Allergen Reactions   Doxycycline Nausea And Vomiting   Objective:  There were no vitals filed for this visit. There is no height or weight on file to calculate BMI. Constitutional Well developed. Well nourished.  Vascular Dorsalis pedis pulses palpable bilaterally. Posterior tibial pulses palpable bilaterally.  Capillary refill normal to all digits.  No cyanosis or clubbing noted. Pedal hair growth normal.  Neurologic Normal speech. Oriented to person, place, and time. Epicritic sensation to light touch grossly present bilaterally.  Dermatologic Right transmetatarsal amputation wound site noted limited to the breakdown of the skin.  No purulent drainage noted.  No other signs of infection noted left hallux IPJ ulceration with fat layer exposed noted.  No b no purulent drainage noted no cellulitis noted no redness noted  Orthopedic: Normal joint ROM without pain or crepitus bilaterally. No visible deformities. No bony tenderness.    Radiographs: 3 views of bilateral x-rays reviewed no signs of osteomyelitis noted at the with the wound correlation Assessment:   1. History of transmetatarsal amputation of right foot (HCC)   2. Chronic toe ulcer, left, with fat layer exposed (HCC)    Plan:  Patient was evaluated and treated and all questions answered.  Left transmetatarsal amputation superficial wound -All questions and concerns were discussed with the patient extensive detail given the superficial nature of it this is likely due to contracture of the transmetatarsal amputation patient will benefit from Silvadene cream continue applying Silvadene cream  Right hallux IPJ wound -All questions and concerns were discussed with the patient in extensive detail given the amount of wound that is present patient will benefit from Silvadene cream Silvadene cream was sent to the pharmacy.  He will continue applying it. -If no improvement patient will need to go to the wound care center  Return in about 2 weeks (around 01/01/2024).

## 2023-12-24 ENCOUNTER — Encounter: Payer: Medicaid Other | Admitting: Student

## 2023-12-24 ENCOUNTER — Ambulatory Visit (INDEPENDENT_AMBULATORY_CARE_PROVIDER_SITE_OTHER): Payer: Medicaid Other | Admitting: Licensed Clinical Social Worker

## 2023-12-24 DIAGNOSIS — F321 Major depressive disorder, single episode, moderate: Secondary | ICD-10-CM | POA: Diagnosis not present

## 2023-12-24 NOTE — BH Specialist Note (Signed)
 Integrated Behavioral Health via Telemedicine Visit  12/24/2023 AARAN ENBERG [id number]  Number of Integrated Behavioral Health Clinician visits: 1- Initial Visit  Session Start time: 1530   Session End time: 1600  Total time in minutes: 30   Referring Provider: PCP Patient/Family location: [facility] Provider location: Office All persons participating in visit: [facility] and Patient Types of Service: Introduction only  I connected with [name]  via  Telephone and verified that I am speaking with the correct person using two identifiers. Discussed confidentiality: Yes   I discussed the limitations of telemedicine and the availability of in person appointments.  Discussed there is a possibility of technology failure and discussed alternative modes of communication if that failure occurs.  I discussed that engaging in this telemedicine visit, they consent to the provision of behavioral healthcare and the services will be billed under their insurance.  Patient and/or legal guardian expressed understanding and consented to Telemedicine visit: Yes   Presenting Concerns: Patient and/or family reports the following symptoms/concerns: The Licensed Clinical Engineer, building services (LCSW-A), acting as a Visual merchandiser [facility]), initiated a session with patient. The [facility] introduced themselves, explained her role, and provided contact information to the patient. Confidentiality and mandated reporting were discussed, and the patient denied any suicidal ideations or intent to harm others. The Integrated Behavioral Health (IBH) approach was reviewed, and a PHQ-9 assessment was completed.     12/10/2023    4:39 PM 12/10/2023    4:38 PM 08/12/2023    2:39 PM  PHQ-SADS Last 3 Score only  Total GAD-7 Score 13    PHQ Adolescent Score  14 0    Duration of problem: More than a year; Severity of problem: moderate  Patient and/or Family's Strengths/Protective Factors: Sense of  purpose  Goals Addressed: Patient will:  Reduce symptoms of: depression   Progress towards Goals: Ongoing  Interventions: Interventions utilized:  CBT Cognitive Behavioral Therapy and Link to [employer] Standardized Assessments completed: PHQ-SADS  Patient and/or Family Response: Patient agrees to OPT  Assessment: Patient currently experiencing Depression.   Patient may benefit from OPT.  Plan: Follow up with behavioral health clinician on : 03/04 via Telehealth   I discussed the assessment and treatment plan with the patient and/or parent/guardian. They were provided an opportunity to ask questions and all were answered. They agreed with the plan and demonstrated an understanding of the instructions.   They were advised to call back or seek an in-person evaluation if the symptoms worsen or if the condition fails to improve as anticipated.  Christen Butter, MSW, LCSW-A She/Her Behavioral Health Clinician [facility]  Internal Medicine Center Direct [phone]  Fax [phone] Main Office Phone: [phone] [address] [place] [place], [place] [address] Website: [facility] Internal Medicine [facility]  [place], [place]  [place]

## 2024-01-02 ENCOUNTER — Other Ambulatory Visit: Payer: Medicaid Other

## 2024-01-02 DIAGNOSIS — E875 Hyperkalemia: Secondary | ICD-10-CM

## 2024-01-03 ENCOUNTER — Ambulatory Visit: Payer: Medicaid Other | Admitting: Podiatry

## 2024-01-04 LAB — BASIC METABOLIC PANEL
BUN/Creatinine Ratio: 9 (ref 9–20)
BUN: 18 mg/dL (ref 6–20)
CO2: 18 mmol/L — ABNORMAL LOW (ref 20–29)
Calcium: 9.5 mg/dL (ref 8.7–10.2)
Chloride: 100 mmol/L (ref 96–106)
Creatinine, Ser: 1.96 mg/dL — ABNORMAL HIGH (ref 0.76–1.27)
Glucose: 141 mg/dL — ABNORMAL HIGH (ref 70–99)
Potassium: 5 mmol/L (ref 3.5–5.2)
Sodium: 134 mmol/L (ref 134–144)
eGFR: 44 mL/min/{1.73_m2} — ABNORMAL LOW (ref >59)

## 2024-01-07 ENCOUNTER — Ambulatory Visit: Payer: Medicaid Other | Admitting: Podiatry

## 2024-01-07 ENCOUNTER — Ambulatory Visit (INDEPENDENT_AMBULATORY_CARE_PROVIDER_SITE_OTHER): Payer: Medicaid Other | Admitting: Licensed Clinical Social Worker

## 2024-01-07 DIAGNOSIS — F321 Major depressive disorder, single episode, moderate: Secondary | ICD-10-CM

## 2024-01-07 NOTE — BH Specialist Note (Signed)
 Integrated Behavioral Health via Telemedicine Visit  01/07/2024 Carl Conley [id number]  Number of Integrated Behavioral Health Clinician visits: Additional Visit  Session Start time: 1530   Session End time: 1600  Total time in minutes: 30   Referring Provider: PCP Patient/Family location: [facility] Provider location: [facility] All persons participating in visit: [facility] and Patient Types of Service: Telephone visit and General Behavioral Integrated Care (BHI)  I connected with Carl Conley via  Telephone  and verified that I am speaking with the correct person using two identifiers. Discussed confidentiality: Yes   I discussed the limitations of telemedicine and the availability of in person appointments.  Discussed there is a possibility of technology failure and discussed alternative modes of communication if that failure occurs.  I discussed that engaging in this telemedicine visit, they consent to the provision of behavioral healthcare and the services will be billed under their insurance.  Patient and/or legal guardian expressed understanding and consented to Telemedicine visit: Yes   Presenting Concerns: Patient and/or family reports the following symptoms/concerns: LCSW-A, [facility] met with client today for a 30-minute session. Client reported feeling "ok," but acknowledged experiencing ongoing depressive symptoms, including decreased interest and motivation. Client expressed anxiety related to awaiting medical test results, specifically regarding kidney concerns due to negative side effects from Metformin. Client has been managing stress by engaging in household cleaning and organization tasks.   During the session, clinician validated client's feelings and provided psychoeducation on the connection between physical health challenges and emotional well-being. Coping skills recommended included behavioral activation through scheduling enjoyable daily activities, mindfulness  practices such as deep breathing or guided meditation exercises, cognitive reframing of negative thoughts related to health concerns, and engaging social support systems to reduce isolation.    Patient and/or Family's Strengths/Protective Factors: Social connections  Goals Addressed: Patient will:  Reduce symptoms of: depression   Progress towards Goals: Ongoing  Interventions: Interventions utilized:  CBT Cognitive Behavioral Therapy Standardized Assessments completed: Not Needed   Assessment: Patient currently experiencing Depression.   Patient may benefit from OPT.  Plan: Follow up with behavioral health clinician on : Patient will call to schedule upcoming appointment when available.   I discussed the assessment and treatment plan with the patient and/or parent/guardian. They were provided an opportunity to ask questions and all were answered. They agreed with the plan and demonstrated an understanding of the instructions.   They were advised to call back or seek an in-person evaluation if the symptoms worsen or if the condition fails to improve as anticipated.  Carl Conley, MSW, LCSW-A She/Her Behavioral Health Clinician [facility]  Internal Medicine Center Direct Dial:[phone]  Fax [phone] Main Office Phone: [phone] [address] [place] [place], [place] [address] Website: [facility] Internal Medicine [facility]  [place], [place]  [place]

## 2024-01-09 ENCOUNTER — Encounter: Payer: Self-pay | Admitting: Podiatry

## 2024-01-09 ENCOUNTER — Ambulatory Visit

## 2024-01-09 ENCOUNTER — Ambulatory Visit (INDEPENDENT_AMBULATORY_CARE_PROVIDER_SITE_OTHER): Payer: Medicaid Other | Admitting: Podiatry

## 2024-01-09 VITALS — Ht 71.0 in | Wt 249.5 lb

## 2024-01-09 DIAGNOSIS — Z89431 Acquired absence of right foot: Secondary | ICD-10-CM | POA: Diagnosis not present

## 2024-01-09 DIAGNOSIS — L97522 Non-pressure chronic ulcer of other part of left foot with fat layer exposed: Secondary | ICD-10-CM | POA: Diagnosis not present

## 2024-01-09 DIAGNOSIS — L97512 Non-pressure chronic ulcer of other part of right foot with fat layer exposed: Secondary | ICD-10-CM | POA: Diagnosis not present

## 2024-01-09 DIAGNOSIS — Z89432 Acquired absence of left foot: Secondary | ICD-10-CM | POA: Diagnosis not present

## 2024-01-09 NOTE — Progress Notes (Signed)
 Subjective:  Patient ID: Carl Conley, male    DOB: 21-Oct-1986,  MRN: [id number]  Chief Complaint  Patient presents with   Wound Check    Pt is here due to 2 wound on bilateral feet pt has a wound on the back of his right great toe states it has been there for a while pain comes and goes, left has all toes amputated but a wound on his stump that's fairly new, was prescribe cream to but on but he can't remember the name of it.    38 y.o. male presents with concern for ulceration on bilateral foot.  He has had prior transmetatarsal amputation left foot recently developed a new wound there plantar aspect of the forefoot.  Has noticed some drainage from it.  He also has a chronic wound on the right hallux plantar aspect which she has been going to wound care for 1 time.  They took x-rays in early February which showed possible osteo lysis and compression of the third metatarsal head on the right foot he does not have any wound at that location.  No evidence of osteomyelitis of the hallux on the x-ray.  On the left foot there is no evidence of osteolysis or erosions at the transmetatarsal amputation site on x-rays taken in February.   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   Depression 10/31/2016   Diabetes mellitus (HCC)    dx date 2004 at age 32   Low back pain 09/01/2016   Visual snow syndrome     Allergies  Allergen Reactions   Doxycycline Nausea And Vomiting    ROS: Negative except as per HPI above  Objective:  General: AAO x3, NAD  Dermatological: Circular ulceration plantar distal aspect of the left amputation site.  Erythema and edema.  No active drainage mild malodor present.  Wound has a healthy granular tissue base.  Right foot with ulceration subhallux without evidence of significant infection there is hyperkeratotic tissue overlying and surrounding the wound.  Healthy granular tissue base here.  Negative probe to bone.   Also has a small very superficial tiny scab at the lateral aspect of the right ankle lateral malleolus without evidence of infection appears to be healing.  Vascular:  Dorsalis Pedis artery and Posterior Tibial artery pedal pulses are 2/4 bilateral.  Capillary fill time < 3 sec to all digits.   Neruologic: Grossly absent via light touch distal forefoot bilaterally  Musculoskeletal: Status post left foot TMA with erythema and edema of the left stump site  Gait: Unassisted, Nonantalgic.      Radiographs:  Date: 01/09/2024 XR both feet Weightbearing AP/Lateral/Oblique   Findings: Possible osteolysis of the plantar aspect of the IPJ seen on the right foot lateral.  Concern for erosions concerning for osteomyelitis.  On the left foot evidence of proximal transmetatarsal amputation no obvious evidence of erosions however cannot rule out.  Soft tissue edema is noted.  No soft tissue gas Assessment:   1. Chronic toe ulcer, right, with fat layer exposed (HCC)   2. Ulcer of left foot with fat layer exposed (HCC)   3. History of transmetatarsal amputation of left foot (HCC)      Plan:  Patient was evaluated and treated and all questions answered.  # Left foot ulceration right TMA stump site -Concern for possible osteomyelitis residually at the distal aspect of the amputation site and the remaining metatarsals. -Ordered MRI to assess  for osteomyelitis. -Ordered CRP and ESR studies encourage patient to get these done soon as possible -Augmentin 875-125 mg twice daily for 10 days prescribed given concern for infection hearing on the right foot -Continue local wound care with antibiotic ointment and gauze dressing change daily or every other day based on drainage -Follow-up after MRI and blood work is done possibly need revision of the transmetatarsal amputation site  # Right foot ulceration plantar IPJ hallux -Concern for underlying osteomyelitis based on x-ray -Debrided the ulceration and  applied Betadine dressing -Continue with daily Betadine gauze bandage dressing over the wound -MRI ordered right foot to evaluate for osteomyelitis and right hallux -ESR and CRP as above -Antibiotics as above -Possibly amputation of the right hallux pending MRI results he is aware         Corinna Gab, DPM [facility] / [facility]

## 2024-01-13 MED ORDER — AMOXICILLIN-POT CLAVULANATE 875-125 MG PO TABS: 1.0000 | ORAL_TABLET | Freq: Two times a day (BID) | ORAL | 0 refills | Status: DC

## 2024-01-30 ENCOUNTER — Ambulatory Visit (INDEPENDENT_AMBULATORY_CARE_PROVIDER_SITE_OTHER): Admitting: Podiatry

## 2024-01-30 DIAGNOSIS — Z91199 Patient's noncompliance with other medical treatment and regimen due to unspecified reason: Secondary | ICD-10-CM

## 2024-01-30 NOTE — Progress Notes (Signed)
 No show for apt.

## 2024-02-04 ENCOUNTER — Ambulatory Visit
Admission: RE | Admit: 2024-02-04 | Discharge: 2024-02-04 | Disposition: A | Discharge status: Home / Self Care | Source: Ambulatory Visit | Attending: Podiatry

## 2024-02-04 ENCOUNTER — Ambulatory Visit
Admission: RE | Admit: 2024-02-04 | Discharge: 2024-02-04 | Disposition: A | Discharge status: Home / Self Care | Source: Ambulatory Visit | Attending: Podiatry | Admitting: Podiatry

## 2024-02-04 DIAGNOSIS — L97522 Non-pressure chronic ulcer of other part of left foot with fat layer exposed: Secondary | ICD-10-CM

## 2024-02-04 DIAGNOSIS — L97512 Non-pressure chronic ulcer of other part of right foot with fat layer exposed: Secondary | ICD-10-CM

## 2024-03-06 ENCOUNTER — Encounter: Payer: Self-pay | Admitting: Student

## 2024-03-06 ENCOUNTER — Other Ambulatory Visit: Payer: Self-pay

## 2024-03-06 ENCOUNTER — Ambulatory Visit: Payer: Medicaid Other | Admitting: Student

## 2024-03-06 VITALS — BP 109/66 | HR 88 | Temp 97.5°F | Ht 71.0 in | Wt 262.4 lb

## 2024-03-06 DIAGNOSIS — L219 Seborrheic dermatitis, unspecified: Secondary | ICD-10-CM | POA: Diagnosis not present

## 2024-03-06 DIAGNOSIS — E11621 Type 2 diabetes mellitus with foot ulcer: Secondary | ICD-10-CM

## 2024-03-06 DIAGNOSIS — E1159 Type 2 diabetes mellitus with other circulatory complications: Secondary | ICD-10-CM

## 2024-03-06 DIAGNOSIS — L97521 Non-pressure chronic ulcer of other part of left foot limited to breakdown of skin: Secondary | ICD-10-CM

## 2024-03-06 DIAGNOSIS — Z7984 Long term (current) use of oral hypoglycemic drugs: Secondary | ICD-10-CM

## 2024-03-06 LAB — POCT GLYCOSYLATED HEMOGLOBIN (HGB A1C): Hemoglobin A1C: 7.2 % — AB (ref 4.0–5.6)

## 2024-03-06 LAB — GLUCOSE, CAPILLARY: Glucose-Capillary: 331 mg/dL — ABNORMAL HIGH (ref 70–99)

## 2024-03-06 MED ORDER — EMPAGLIFLOZIN 10 MG PO TABS: 10.0000 mg | ORAL_TABLET | Freq: Every day | ORAL | 3 refills | Status: DC

## 2024-03-06 MED ORDER — KETOCONAZOLE 2 % EX SHAM: 1.0000 | MEDICATED_SHAMPOO | CUTANEOUS | 0 refills | Status: DC

## 2024-03-06 NOTE — Assessment & Plan Note (Signed)
 Patient was instructed to return to podiatry for further management of diabetic ulcers. Appointment is scheduled for 5/29 with podiatry.

## 2024-03-06 NOTE — Assessment & Plan Note (Signed)
Ketoconazole shampoo ordered.

## 2024-03-06 NOTE — Progress Notes (Signed)
 Established Patient Office Visit  Subjective   Patient ID: AHADU SKAHAN, male    DOB: [birth year]  Age: 38 y.o. MRN: [id number]  Chief Complaint  Patient presents with   Follow-up   Diabetes   Results     Discuss MRI  results     Carl Conley is a 38 y.o. who presents to the clinic for a three month follow of T2DM and a rash along his hair line with dandruff. Please see problem based assessment and plan for additional details.   Patient Active Problem List   Diagnosis Date Noted   Diabetic ulcer of Left foot, limited to breakdown of skin (HCC) 12/10/2023   Diabetic ulcer of toe of right foot associated with type 2 diabetes mellitus (HCC) 08/12/2023   Flat affect 08/12/2023   Acute kidney injury (HCC) 02/07/2023   Anemia 02/07/2023   History of transmetatarsal amputation of foot (HCC) 01/27/2023   Amputation of toe of right foot (HCC) 01/27/2023   Dependent for transportation 01/14/2023   Low blood pressure 01/14/2023   Encounter for screening involving social determinants of health (SDoH) 12/13/2022   Pressure injury of skin 12/03/2022   Diabetic osteomyelitis (HCC) 11/01/2022   Healthcare maintenance 07/17/2021   Seborrheic dermatitis 11/11/2020   NAFLD (nonalcoholic fatty liver disease) 40/98/1191   ADHD    Depression 10/31/2016   Diabetes (HCC) 04/13/2016       Objective:     BP 109/66 (BP Location: Left Arm, Patient Position: Sitting, Cuff Size: Large)   Pulse 88   Temp (!) 97.5 F (36.4 C) (Oral)   Ht 5\' 11"  (1.803 m)   Wt 262 lb 6.4 oz (119 kg)   SpO2 99%   BMI 36.60 kg/m  BP Readings from Last 3 Encounters:  03/06/24 109/66  12/10/23 115/69  08/12/23 103/64   Wt Readings from Last 3 Encounters:  03/06/24 262 lb 6.4 oz (119 kg)  01/09/24 249 lb 8 oz (113.2 kg)  12/10/23 249 lb 8 oz (113.2 kg)      Physical Exam Vitals reviewed.  Cardiovascular:     Rate and Rhythm: Normal rate and regular rhythm.     Heart sounds: Normal heart  sounds. No murmur heard. Pulmonary:     Effort: Pulmonary effort is normal. No respiratory distress.     Breath sounds: Normal breath sounds. No stridor. No wheezing, rhonchi or rales.  Abdominal:     General: Bowel sounds are normal. There is no distension.     Palpations: Abdomen is soft.     Tenderness: There is no abdominal tenderness.  Musculoskeletal:     Right lower leg: No edema.     Left lower leg: No edema.     Comments: Bilateral foot ulcerations appear similar to prior office visit  Skin:    General: Skin is warm and dry.     Findings: Rash (SEBORREHIC DERMATITIS ALONG THE SCALP LINE WITH DANDRUFF) present.  Neurological:     Mental Status: He is alert.  Psychiatric:        Mood and Affect: Mood and affect normal.        Behavior: Behavior normal.      Results for orders placed or performed in visit on 03/06/24  Glucose, capillary  Result Value Ref Range   Glucose-Capillary 331 (H) 70 - 99 mg/dL  POC Hbg Y7W  Result Value Ref Range   Hemoglobin A1C 7.2 (A) 4.0 - 5.6 %   HbA1c POC (<> result, manual  entry)     HbA1c, POC (prediabetic range)     HbA1c, POC (controlled diabetic range)      Last metabolic panel Lab Results  Component Value Date   GLUCOSE 141 (H) 01/02/2024   NA 134 01/02/2024   K 5.0 01/02/2024   CL 100 01/02/2024   CO2 18 (L) 01/02/2024   BUN 18 01/02/2024   CREATININE 1.96 (H) 01/02/2024   EGFR 44 (L) 01/02/2024   CALCIUM  9.5 01/02/2024   PROT 6.9 11/27/2022   ALBUMIN 2.7 (L) 11/27/2022   LABGLOB 2.5 07/17/2021   AGRATIO 2.0 07/17/2021   BILITOT 0.3 11/27/2022   ALKPHOS 49 11/27/2022   AST 17 11/27/2022   ALT 16 11/27/2022   ANIONGAP 7 11/27/2022   Last lipids Lab Results  Component Value Date   CHOL 199 11/27/2022   HDL 21 (L) 11/27/2022   LDLCALC 141 (H) 11/27/2022   TRIG 185 (H) 11/27/2022   CHOLHDL 9.5 11/27/2022   Last hemoglobin A1c Lab Results  Component Value Date   HGBA1C 7.2 (A) 03/06/2024      The ASCVD  Risk score (Arnett DK, et al., 2019) failed to calculate for the following reasons:   The 2019 ASCVD risk score is only valid for ages 21 to 58    Assessment & Plan:   Problem List Items Addressed This Visit       Endocrine   Diabetes [facility]) - Primary   Patient presents with a history of T2DM with a prior A1c of 6.2 in February 2025.  Patient's A1c today is 7.2.  They are on a regimen of Metformin  2,000mg .   Plan: -Continue regimen of Metformin  2,000 mg and start Jardiance 10mg   -A1c today -Ophthalmology referral sent  -Urine ACR  in March 2024 was elevated to 69, he is currently on on a SGLT2i or ACEi/ARB. Per chart review, one year ago he was hypotensive which was thought to be due to a combination of SGLT2i and nausea/vomiting, at that time SGLT2i was discontinued.  Start Jardiance 10mg : Patient agreed to increasing oral hydration.       Relevant Medications   empagliflozin (JARDIANCE) 10 MG TABS tablet   Other Relevant Orders   POC Hbg A1C (Completed)   Microalbumin / Creatinine Urine Ratio   Ambulatory referral to Ophthalmology   Diabetic ulcer of toe of right foot associated with type 2 diabetes mellitus (HCC)   Patient was instructed to return to podiatry for further management of diabetic ulcers. Appointment is scheduled for 5/29 with podiatry.       Relevant Medications   empagliflozin (JARDIANCE) 10 MG TABS tablet   Diabetic ulcer of Left foot, limited to breakdown of skin (HCC)   Patient was instructed to return to podiatry for further management of diabetic ulcers. Appointment is scheduled for 5/29 with podiatry.       Relevant Medications   empagliflozin (JARDIANCE) 10 MG TABS tablet     Musculoskeletal and Integument   Seborrheic dermatitis   Ketoconazole  shampoo ordered.        Return in about 3 months (around 06/06/2024) for DM.    Aurora Lees, DO

## 2024-03-06 NOTE — Assessment & Plan Note (Addendum)
 Patient presents with a history of T2DM with a prior A1c of 6.2 in February 2025.  Patient's A1c today is 7.2.  They are on a regimen of Metformin  2,000mg .   Plan: -Continue regimen of Metformin  2,000 mg and start Jardiance 10mg   -A1c today -Ophthalmology referral sent  -Urine ACR  in March 2024 was elevated to 69, he is currently on on a SGLT2i or ACEi/ARB. Per chart review, one year ago he was hypotensive which was thought to be due to a combination of SGLT2i and nausea/vomiting, at that time SGLT2i was discontinued.  Start Jardiance 10mg : Patient agreed to increasing oral hydration.

## 2024-03-06 NOTE — Assessment & Plan Note (Deleted)
 Patient presents with a history of T2DM with a prior A1c of 6.2 in February 2025.  Patient's A1c today is 7.2.  They are on a regimen of Metformin  2,000mg .   Plan: -Continue regimen of Metformin  2,000 mg and start Jardiance 10mg   -A1c today -Ophthalmology referral sent  -Urine ACR  in March 2024 was elevated to 69, he is currently on on a SGLT2i or ACEi/ARB. Per chart review, one year ago he was hypotensive which was thought to be due to a combination of SGLT2i and nausea/vomiting, at that time SGLT2i was discontinued.  Start Jardiance 10mg 

## 2024-03-07 LAB — MICROALBUMIN / CREATININE URINE RATIO
Creatinine, Urine: 59.6 mg/dL
Microalb/Creat Ratio: 21 mg/g{creat} (ref 0–29)
Microalbumin, Urine: 12.6 ug/mL

## 2024-03-09 ENCOUNTER — Encounter: Payer: Self-pay | Admitting: Student

## 2024-03-13 NOTE — Progress Notes (Deleted)
 Internal Medicine Clinic Attending  Case discussed with the resident at the time of the visit.  We reviewed the resident's history and exam and pertinent patient test results.  I agree with the assessment, diagnosis, and plan of care documented in the resident's note.

## 2024-03-17 ENCOUNTER — Encounter: Payer: Self-pay | Admitting: Student

## 2024-03-17 NOTE — Progress Notes (Signed)
 Internal Medicine Clinic Attending  Case discussed with the resident at the time of the visit.  We reviewed the resident's history and exam and pertinent patient test results.  I agree with the assessment, diagnosis, and plan of care documented in the resident's note.  Dr. Sharlon Deacon has discussed risks/benefits of poor diabetes control and medication options with Carl Conley. History of diabetic foot ulcers and amputation while on canagliflozin . This medication was stopped due to hypotension. Conflicting evidence regarding risk of LE amputations with other SGLT2i such as empagliflozin . Carl Conley will continue to think about alternatives such as GLP-1. F/u with podiatry scheduled later this month.

## 2024-04-02 ENCOUNTER — Ambulatory Visit (INDEPENDENT_AMBULATORY_CARE_PROVIDER_SITE_OTHER): Admitting: Podiatry

## 2024-04-02 ENCOUNTER — Ambulatory Visit: Admitting: Podiatry

## 2024-04-02 DIAGNOSIS — M86672 Other chronic osteomyelitis, left ankle and foot: Secondary | ICD-10-CM

## 2024-04-02 DIAGNOSIS — E1142 Type 2 diabetes mellitus with diabetic polyneuropathy: Secondary | ICD-10-CM

## 2024-04-02 DIAGNOSIS — L97512 Non-pressure chronic ulcer of other part of right foot with fat layer exposed: Secondary | ICD-10-CM

## 2024-04-02 DIAGNOSIS — Z89432 Acquired absence of left foot: Secondary | ICD-10-CM

## 2024-04-02 NOTE — Progress Notes (Signed)
 Subjective:  Patient ID: Carl Conley, male    DOB: 15-Oct-1986,  MRN: [id number]  Chief Complaint  Patient presents with   MRI Results    Recent MRI results to be reviewed. Patient also has healing ulcer on left foot. There is an area that is healing on left 1st hallux that has callus skin over it. Some cracking below the original wound.     38 y.o. male presents with concern for ulceration on bilateral foot.  He has had prior transmetatarsal amputation left foot.  Last seen January 09, 2024 and MRI were ordered of the bilateral foot.  He has not followed up since then but is here today to discuss the MRI results.  He says the right great toe wounds have been doing all right and healing.  Says the left foot plantar wound is largely the same as prior does have a lot of drainage from it.  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   Depression 10/31/2016   Diabetes mellitus (HCC)    dx date 2004 at age 41   Low back pain 09/01/2016   Visual snow syndrome     Allergies  Allergen Reactions   Doxycycline  Nausea And Vomiting    ROS: Negative except as per HPI above  Objective:  General: AAO x3, NAD  Dermatological: Circular ulceration plantar distal aspect of the left amputation site.  Erythema and edema.  No active drainage mild malodor present.  Wound has a healthy granular tissue base.  Right foot with ulceration subhallux without evidence of significant infection there is hyperkeratotic tissue overlying and surrounding the wound.  Healthy granular tissue base here.  Negative probe to bone.  Also has a small very superficial tiny scab at the lateral aspect of the right ankle lateral malleolus without evidence of infection appears to be healing.  Vascular:  Dorsalis Pedis artery and Posterior Tibial artery pedal pulses are 2/4 bilateral.  Capillary fill time < 3 sec to all digits.   Neruologic: Grossly absent via light touch distal  forefoot bilaterally  Musculoskeletal: Status post left foot TMA with erythema and edema of the left stump site  Gait: Unassisted, Nonantalgic.   Radiographs:  Date: 01/09/2024 XR both feet Weightbearing AP/Lateral/Oblique   Findings: Possible osteolysis of the plantar aspect of the IPJ seen on the right foot lateral.  Concern for erosions concerning for osteomyelitis.  On the left foot evidence of proximal transmetatarsal amputation no obvious evidence of erosions however cannot rule out.  Soft tissue edema is noted.  No soft tissue gas  MRI L Foot: Erythema with corresponding T1 hypointensity at the remnant second metatarsal proximal shaft as well as fifth metatarsal proximal shaft concerning for possible osteomyelitis.  No abscess identified  MRI R Foot: Soft tissue ulceration of the plantar great toe no marrow abnormality of the proximal or distal phalanx evidence of osteomyelitis.  Erosion changes and collapse of the distal third metatarsal head possibly posttraumatic versus Charcot arthropathy less likely infection.  Erosive changes lateral base of the cuboid is new concern for inflammatory arthropathy versus osteomyelitis  Assessment:   1. Other chronic osteomyelitis of left foot (HCC)   2. Chronic toe ulcer, right, with fat layer exposed (HCC)   3. History of transmetatarsal amputation of left foot (HCC)   4. DM type 2 with diabetic peripheral neuropathy (HCC)       Plan:  Patient was evaluated and treated and all  questions answered.  # Left foot ulceration right TMA stump site - MRI foot with concern for osteomyelitis of the second metatarsal remnant which correlates to the area of ulceration - Discussed with patient that we will likely need to proceed with repeat surgical intervention to include resection of any infected bone and soft tissue debride the ulcer and possibly apply a wound VAC versus close the area as able. -Discussed outpatient versus inpatient management  however given concern for chronic osteomyelitis he will likely need IV antibiotics long-term and therefore I recommend inpatient admission surgery as well as for ID consultation following the procedure.  Will collect bone and tissue cultures during the surgery. -I discussed having patient admitted tonight for possible surgery tomorrow however he has an important legal matter he has to attend to tomorrow in [place].  Therefore I will have him hopefully admitted over the weekend for direct admission with plans for surgery Monday - Okay to hold IV antibiotics, will be started postop after cultures are obtained -Continue local wound care with antibiotic ointment and gauze dressing change daily or every other day based on drainage -Follow-up after MRI and blood work is done possibly need revision of the transmetatarsal amputation site  # Right foot ulceration plantar IPJ hallux and plantar first MPJ - MRI right foot without evidence of osteomyelitis of the proximal distal phalanx -MRI finding of collapse of the third metatarsal head thought to be secondary to Charcot arthropathy versus infection as no localized evidence of infection about the third MPJ -Debrided the ulceration with 15 blade to subcutaneous fat tissue healthy bleeding base and applied antibiotic ointment and gauze dressing with Coban wrap -Continue with daily ABX ointment and gauze bandage to the right great toe and plantar first MPJ          Maridee Shoemaker, DPM [facility] / [facility]

## 2024-04-03 ENCOUNTER — Other Ambulatory Visit: Payer: Self-pay | Admitting: Student

## 2024-04-03 DIAGNOSIS — L219 Seborrheic dermatitis, unspecified: Secondary | ICD-10-CM

## 2024-04-03 NOTE — Telephone Encounter (Signed)
 Medication sent to pharmacy

## 2024-04-04 ENCOUNTER — Encounter: Payer: Self-pay | Admitting: Podiatry

## 2024-04-05 ENCOUNTER — Other Ambulatory Visit: Payer: Self-pay

## 2024-04-05 ENCOUNTER — Observation Stay (HOSPITAL_COMMUNITY)
Admission: EM | Admit: 2024-04-05 | Discharge: 2024-04-08 | Disposition: A | Discharge status: Home / Self Care | Attending: Internal Medicine | Admitting: Internal Medicine

## 2024-04-05 ENCOUNTER — Encounter (HOSPITAL_COMMUNITY): Payer: Self-pay | Admitting: *Deleted

## 2024-04-05 DIAGNOSIS — L218 Other seborrheic dermatitis: Secondary | ICD-10-CM | POA: Insufficient documentation

## 2024-04-05 DIAGNOSIS — Z79899 Other long term (current) drug therapy: Secondary | ICD-10-CM | POA: Insufficient documentation

## 2024-04-05 DIAGNOSIS — E1142 Type 2 diabetes mellitus with diabetic polyneuropathy: Secondary | ICD-10-CM | POA: Diagnosis not present

## 2024-04-05 DIAGNOSIS — S81802A Unspecified open wound, left lower leg, initial encounter: Secondary | ICD-10-CM | POA: Insufficient documentation

## 2024-04-05 DIAGNOSIS — Z7984 Long term (current) use of oral hypoglycemic drugs: Secondary | ICD-10-CM | POA: Insufficient documentation

## 2024-04-05 DIAGNOSIS — M86672 Other chronic osteomyelitis, left ankle and foot: Principal | ICD-10-CM | POA: Insufficient documentation

## 2024-04-05 DIAGNOSIS — Z89439 Acquired absence of unspecified foot: Secondary | ICD-10-CM

## 2024-04-05 DIAGNOSIS — E1159 Type 2 diabetes mellitus with other circulatory complications: Secondary | ICD-10-CM

## 2024-04-05 DIAGNOSIS — N183 Chronic kidney disease, stage 3 unspecified: Secondary | ICD-10-CM | POA: Diagnosis not present

## 2024-04-05 DIAGNOSIS — D649 Anemia, unspecified: Secondary | ICD-10-CM | POA: Diagnosis not present

## 2024-04-05 DIAGNOSIS — D72829 Elevated white blood cell count, unspecified: Secondary | ICD-10-CM | POA: Diagnosis not present

## 2024-04-05 DIAGNOSIS — E1122 Type 2 diabetes mellitus with diabetic chronic kidney disease: Secondary | ICD-10-CM | POA: Insufficient documentation

## 2024-04-05 DIAGNOSIS — X58XXXA Exposure to other specified factors, initial encounter: Secondary | ICD-10-CM | POA: Diagnosis not present

## 2024-04-05 DIAGNOSIS — E119 Type 2 diabetes mellitus without complications: Secondary | ICD-10-CM

## 2024-04-05 DIAGNOSIS — R21 Rash and other nonspecific skin eruption: Secondary | ICD-10-CM | POA: Diagnosis not present

## 2024-04-05 DIAGNOSIS — L219 Seborrheic dermatitis, unspecified: Secondary | ICD-10-CM | POA: Diagnosis present

## 2024-04-05 DIAGNOSIS — E1169 Type 2 diabetes mellitus with other specified complication: Secondary | ICD-10-CM | POA: Diagnosis present

## 2024-04-05 DIAGNOSIS — T148XXA Other injury of unspecified body region, initial encounter: Principal | ICD-10-CM

## 2024-04-05 DIAGNOSIS — M86472 Chronic osteomyelitis with draining sinus, left ankle and foot: Secondary | ICD-10-CM

## 2024-04-05 LAB — BASIC METABOLIC PANEL WITH GFR
Anion gap: 12 (ref 5–15)
BUN: 37 mg/dL — ABNORMAL HIGH (ref 6–20)
CO2: 22 mmol/L (ref 22–32)
Calcium: 9.4 mg/dL (ref 8.9–10.3)
Chloride: 96 mmol/L — ABNORMAL LOW (ref 98–111)
Creatinine, Ser: 2.1 mg/dL — ABNORMAL HIGH (ref 0.61–1.24)
GFR, Estimated: 41 mL/min — ABNORMAL LOW (ref >60)
Glucose, Bld: 199 mg/dL — ABNORMAL HIGH (ref 70–99)
Potassium: 4.1 mmol/L (ref 3.5–5.1)
Sodium: 130 mmol/L — ABNORMAL LOW (ref 135–145)

## 2024-04-05 LAB — CBC WITH DIFFERENTIAL/PLATELET
Abs Immature Granulocytes: 0.05 10*3/uL (ref 0.00–0.07)
Basophils Absolute: 0.1 10*3/uL (ref 0.0–0.1)
Basophils Relative: 1 %
Eosinophils Absolute: 0.4 10*3/uL (ref 0.0–0.5)
Eosinophils Relative: 3 %
HCT: 31.3 % — ABNORMAL LOW (ref 39.0–52.0)
Hemoglobin: 11 g/dL — ABNORMAL LOW (ref 13.0–17.0)
Immature Granulocytes: 0 %
Lymphocytes Relative: 27 %
Lymphs Abs: 3.1 10*3/uL (ref 0.7–4.0)
MCH: 28.3 pg (ref 26.0–34.0)
MCHC: 35.1 g/dL (ref 30.0–36.0)
MCV: 80.5 fL (ref 80.0–100.0)
Monocytes Absolute: 0.6 10*3/uL (ref 0.1–1.0)
Monocytes Relative: 5 %
Neutro Abs: 7.2 10*3/uL (ref 1.7–7.7)
Neutrophils Relative %: 64 %
Platelets: 250 10*3/uL (ref 150–400)
RBC: 3.89 MIL/uL — ABNORMAL LOW (ref 4.22–5.81)
RDW: 13.4 % (ref 11.5–15.5)
WBC: 11.4 10*3/uL — ABNORMAL HIGH (ref 4.0–10.5)
nRBC: 0 % (ref 0.0–0.2)

## 2024-04-05 MED ORDER — ACYCLOVIR 400 MG PO TABS
400.0000 mg | ORAL_TABLET | Freq: Two times a day (BID) | ORAL | Status: DC
  Administered 2024-04-06 – 2024-04-08 (×5): 400 mg via ORAL
  Filled 2024-04-05 (×6): qty 1

## 2024-04-05 MED ORDER — ROSUVASTATIN CALCIUM 20 MG PO TABS
20.0000 mg | ORAL_TABLET | Freq: Every day | ORAL | Status: DC
  Administered 2024-04-06 – 2024-04-08 (×3): 20 mg via ORAL
  Filled 2024-04-05 (×3): qty 1

## 2024-04-05 MED ORDER — KETOCONAZOLE 2 % EX SHAM
1.0000 | MEDICATED_SHAMPOO | CUTANEOUS | Status: DC
  Filled 2024-04-05: qty 120

## 2024-04-05 MED ORDER — CITALOPRAM HYDROBROMIDE 20 MG PO TABS
20.0000 mg | ORAL_TABLET | Freq: Every day | ORAL | Status: DC
  Administered 2024-04-06 – 2024-04-07 (×2): 20 mg via ORAL
  Filled 2024-04-05 (×2): qty 1

## 2024-04-05 NOTE — H&P (Incomplete)
 Date: 04/05/2024               Patient Name:  Carl Conley MRN: [id number]  DOB: 1986/07/14 Age / Sex: 38 y.o., male   PCP: Nooruddin, Saad, MD         Medical Service: Internal Medicine Teaching Service         Attending Physician: Dr. Broadus Canes, Whitney Hams, MD    First Contact: Dr. Aurora Lees, MD Pager: [phone]  Second Contact: Dr. Cleven Dallas, DO  Pager: [phone]       After Hours (After 5p/  First Contact Pager: [phone]  weekends / holidays): Second Contact Pager: [phone]   Chief Complaint: left foot surgery   History of Present Illness:  Trew Sunde. 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 Patient benefited from reminders about medical history from the chart to provide an HPI, stating that he has "memory problems" at times.   Per patient, has been dealing with a left foot wound for a few months for which he follows with Dr. Rosemarie Conquest at [facility]. States the wound is not painful, as he does not have much sensation to the bottom of the foot. He has been applying topical antibiotic to it and covering it with a gauze pad/medical tape to keep protected while walking. Sometimes has sparse discharge that can be malodorous although not any now. Denies any bleeding. Was seen by Dr. Rosemarie Conquest on 5/29 to review MRI results from March of both feet. Was told to come in to the hospital to get a wound vac placed on his left foot. Clarified with patient that the MRI of the left foot showed concerns for osteomyelitis (at the remnant second metatarsal proximal shaft as well as the fifth metatarsal proximal shaft, soft tissue edema noted, no tissue gas, no abscess) and Dr. Eliza Gull plan for admission was for surgical intervention to resect any infected bone and  debridement of the soft tissue with a possible wound VAC versus closure of the affected area. Patient seemed surprised by mentioning of surgery but agreeable to proceed with plan. Also clarified that he would continue with home management of his right foot wounds following in-clinic debridement with a surgical blade on 5/29 as the right foot MRI was without evidence of osteomyelitis.   Code Status: Confirmed Full Code Surrogate decision maker: Patient declined surrogate decision maker at this time, stating that he defers decisions to the medical team. He is OK with the team discussing medical care with his father, Kino Dunsworth.   Meds:  Current Meds  Medication Sig  . acyclovir  (ZOVIRAX ) 400 MG tablet Take 400 mg by mouth 2 (two) times daily.  . Bacitracin-Polymyxin B (NEOSPORIN EX) Apply 1 Application topically daily as needed (antibiotic ointment).  . citalopram  (CELEXA ) 20 MG tablet Take 1 tablet (20 mg total) by mouth daily.    Allergies: Allergies as of 04/05/2024 - Review Complete 04/05/2024  Allergen Reaction Noted  . Doxycycline  Nausea And Vomiting 12/18/2022   Past Medical History:  Diagnosis Date  . ADHD   . 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 Depression 10/31/2016  . Diabetes mellitus (HCC)    dx date 2004 at age 66  . Low back pain 09/01/2016  . Visual snow syndrome  Family History:  Family History  Problem Relation Age of Onset  . Depression Mother     Social History:  - Lives alone - Not employed currently, in the process of obtaining disability  - Independent with ADLs and IADLs - Wears glasses - Ambulates without assistance - PCP: Saad Nooruddin - Denies tobacco, alcohol, or illicit drug use   Review of Systems: A complete ROS was negative except as per HPI.   Physical Exam: Blood pressure 111/74, pulse 71, temperature 98.4 F (36.9 C), temperature source Oral, resp. rate 16, height 5\' 11"  (1.803 m), weight 119 kg, SpO2 100%. General: Resting in  bed comfortably in no acute distress, conversing appropriately Cardiovascular: RRR, no r/m/g, no lower extremity edema Pulmonary: Normal respiratory effort on room air  Abdominal: Bowel sounds present, soft, non-tender, non-distended MSK: Left foot s/p TMA, circular ulceration near amputation site, no overt bleeding or discharge noted, no particular malodor present, sensation on plantar surface mostly absent, able to dorsiflex/plantarflex on command, extremity warm to touch, dorsalis pedis and posterior tibial pulses present   Right foot with ulcerations on big toe as well as metatarsal base, no overt bleeding or plantar sensation intact, able to wiggle toes/plantarflex/dorsiflex on command, extremity warm to touch, dorsalis pedis and posterior tibial pulses present    Skin: Diffuse, well defined plaques on bilateral legs, appear eczematous with possible silvery scale, overlying excoriations present; yellow, erythematous, scaly rash on forehead and bilateral nasal ala   Psych: Restricted affect, normal mood  Labs    Latest Ref Rng & Units 04/05/2024    8:37 PM 02/06/2023    2:46 PM 11/27/2022    5:37 AM  CBC  WBC 4.0 - 10.5 K/uL 11.4  9.7  7.0   Hemoglobin 13.0 - 17.0 g/dL 16.1  09.6  9.9   Hematocrit 39.0 - 52.0 % 31.3  40.2  31.3   Platelets 150 - 400 K/uL 250  205  275        Latest Ref Rng & Units 04/05/2024    8:37 PM 01/02/2024    1:33 PM 12/10/2023    3:10 PM  BMP  Glucose 70 - 99 mg/dL 045  409  811   BUN 6 - 20 mg/dL 37  18  27   Creatinine 0.61 - 1.24 mg/dL 9.14  7.82  9.56   BUN/Creat Ratio 9 - 20  9  12    Sodium 135 - 145 mmol/L 130  134  132   Potassium 3.5 - 5.1 mmol/L 4.1  5.0  5.4   Chloride 98 - 111 mmol/L 96  100  97   CO2 22 - 32 mmol/L 22  18  19    Calcium  8.9 - 10.3 mg/dL 9.4  9.5  21.3    Blood cultures pending  Assessment & Plan by Problem: Principal Problem:   Diabetic osteomyelitis (HCC)    Disposition prior to discharge: home Anticipated disposition  after discharge: home Barriers to discharge: surgery with podiatry, PT/OT evaluation   Dispo: Admit patient to Observation with expected length of stay less than 2 midnights.  Signed: Arellano Zameza, Cynthea Drier, MD 04/05/2024, 11:16 PM  After 5pm on weekdays and 1pm on weekends: On Call pager: [phone]

## 2024-04-05 NOTE — ED Provider Notes (Signed)
 [place] EMERGENCY DEPARTMENT AT [facility] Provider Note   CSN: [id number] Arrival date & time: 04/05/24  2000     History  Chief Complaint  Patient presents with   lt foot surgery    Carl Conley is a 38 y.o. male.  HPI Patient presents with concern for ongoing drainage from a wound in his left foot with anticipated surgical repair tomorrow.  Per his podiatrist patient is going to the OR tomorrow for debridement, wound VAC placement.  He has been using topical antibiotics, has had no fever, chills or other complaints.  He continues to have drainage from his left open foot wound and the surgical site near his prior transmetatarsal amputation.    Home Medications Prior to Admission medications   Medication Sig Start Date End Date Taking? Authorizing Provider  Accu-Chek Softclix Lancets lancets use to test blood sugar four times daily 01/05/23   Guilloud, Carolyn, MD  acyclovir  (ZOVIRAX ) 400 MG tablet Take 400 mg by mouth 2 (two) times daily. 09/10/22   [provider]  amoxicillin -clavulanate (AUGMENTIN ) 875-125 MG tablet Take 1 tablet by mouth 2 (two) times daily. 12/11/23   Aurora Lees, DO  Bacitracin-Polymyxin B (NEOSPORIN EX) Apply 1 Application topically daily as needed (antibiotic ointment).    [provider]  blood glucose meter kit and supplies KIT Use up to four times daily as directed. 01/03/23   Guilloud, Carolyn, MD  citalopram  (CELEXA ) 20 MG tablet Take 1 tablet (20 mg total) by mouth daily. 11/20/23   Nooruddin, Saad, MD  Continuous Blood Gluc Receiver (FREESTYLE LIBRE 2 READER) DEVI 1 Device by Does not apply route daily. 02/06/23   Katsadouros, Vasilios, MD  Continuous Blood Gluc Sensor (FREESTYLE LIBRE 2 SENSOR) MISC Please replace sensor every 14 days 02/06/23   Katsadouros, Vasilios, MD  empagliflozin  (JARDIANCE ) 10 MG TABS tablet Take 1 tablet (10 mg total) by mouth daily before breakfast. 03/06/24   Aurora Lees, DO  glucose blood test  strip use to test blood sugar four times daily 01/05/23   Lasandra Points, MD  Insulin  Pen Needle 32G X 4 MM MISC 1 Needle by Does not apply route daily. 03/07/23   True Fuss, MD  ketoconazole  (NIZORAL ) 2 % shampoo APPLY 1 APPLICATION TOPICALLY 2 (TWO) TIMES A WEEK. 04/06/24   Nooruddin, Saad, MD  metFORMIN  (GLUCOPHAGE -XR) 500 MG 24 hr tablet Take 4 tablets (2,000 mg total) by mouth daily with breakfast. 11/20/23   Nooruddin, Saad, MD  mupirocin  ointment (BACTROBAN ) 2 % Apply 1 Application topically 2 (two) times daily. 12/10/23   Aurora Lees, DO  rosuvastatin  (CRESTOR ) 20 MG tablet Take 1 tablet (20 mg total) by mouth daily. 01/14/23   Katsadouros, Vasilios, MD  timolol (TIMOPTIC) 0.5 % ophthalmic solution Place 1 drop into the right eye every morning. 06/12/22   [provider]  VITAMIN D PO Take 1 tablet by mouth daily.    [provider]      Allergies    Doxycycline     Review of Systems   Review of Systems  Physical Exam Updated Vital Signs BP 111/74 (BP Location: Right Arm)   Pulse 71   Temp 98.4 F (36.9 C) (Oral)   Resp 16   Ht 5\' 11"  (1.803 m)   Wt 119 kg   SpO2 100%   BMI 36.59 kg/m  Physical Exam Constitutional:      Appearance: He is not ill-appearing, toxic-appearing or diaphoretic.  Musculoskeletal:     Comments: Transmetatarsal amputation  left foot with 5 cm open wound with drainage, granulation tissue on periphery     ED Results / Procedures / Treatments   Labs (all labs ordered are listed, but only abnormal results are displayed) Labs Reviewed  BASIC METABOLIC PANEL WITH GFR - Abnormal; Notable for the following components:      Result Value   Sodium 130 (*)    Chloride 96 (*)    Glucose, Bld 199 (*)    BUN 37 (*)    Creatinine, Ser 2.10 (*)    GFR, Estimated 41 (*)    All other components within normal limits  CBC WITH DIFFERENTIAL/PLATELET - Abnormal; Notable for the following components:   WBC 11.4 (*)    RBC 3.89 (*)     Hemoglobin 11.0 (*)    HCT 31.3 (*)    All other components within normal limits  CULTURE, BLOOD (ROUTINE X 2)  CULTURE, BLOOD (ROUTINE X 2)    EKG None  Radiology No results found.  Procedures Procedures    Medications Ordered in ED Medications - No data to display  ED Course/ Medical Decision Making/ A&P                                 Medical Decision Making Adult male with chronic osteomyelitis presents with open wound on his foot anticipated surgery tomorrow.  [place]ins for bacteremia, sepsis, patient does have history of diabetes, he is hyperglycemic, but no evidence for DKA.  Patient admitted for anticipated surgical repair tomorrow.  Amount and/or Complexity of Data Reviewed External Data Reviewed: notes.    Details: Podiatry notes reviewed recent internal medicine notes reviewed with ongoing challenges for managing diabetes. Labs: ordered.  Risk Decision regarding hospitalization. Diagnosis or treatment significantly limited by social determinants of health.   Labs notable for worsening renal function, mild leukocytosis.  Patient is afebrile, not hypotensive, cultures will be drawn, antibiotics pending surgical evaluation tomorrow for chronic osteo.        Final Clinical Impression(s) / ED Diagnoses Final diagnoses:  Open wound     Dorenda Gandy, MD 04/05/24 2221

## 2024-04-05 NOTE — ED Triage Notes (Signed)
 The pt reports that he was told by his pod doctor to come here and be admitted so that doctor could place a wound vac in the pts lt foot ???

## 2024-04-05 NOTE — Hospital Course (Addendum)
 Principal Problem:   Diabetic osteomyelitis (HCC) Active Problems:   Diabetes (HCC)   Seborrheic dermatitis   History of transmetatarsal amputation of foot (HCC)   Rash and nonspecific skin eruption   CKD (chronic kidney disease) stage 3, GFR 30-59 ml/min (HCC)  Resolved Problems:   * No resolved hospital problems. *  Consults:***  Procedures:***  Follow-up items:***  Ongoing for some time at this point, more than several months. He has had a transemetatarsal amputation for osteomyeleitis of left foot before. He sees podiatry for this. Last MRI showed marrow edema and T1 hypointensity at transection margin suggestive of osteomyelitis. Dr. Rosemarie Conquest of podiatry recommends admission for debridement and application of wound-vac. Lives alone, neighbors help him out. Able to move around the house, shower, etc. Doesn't drive. No smoking, no drinking.   38 year old with diabetes and chronic osteomyelitis status post left transmetatarsal amputation for same admitted for ongoing chronic osteomyelitis of both feet, with plan for surgery with podiatry to debride and apply wound VAC to left foot.  Diabetic osteomyelitis Pretty severe neuropathy in both feet.  Ongoing drainage from both feet.  MRI in April was suggestive of osteomyelitis at the margin of the transected second metatarsal shaft of the left foot.  MRI of right foot showed some changes that were suggestive of osteomyelitis as well but were not as unequivocal.  Plan is for surgery in a.m.  [name] and no VTE prophylaxis ordered overnight.  Can start antibiotics after surgery.  Start PT and OT after surgery.  May need rehab as he lives alone and I am not sure with his lack of support he will be able to discharge straight to home after his procedure.  Diabetes Decent control of late A1c around 7.  Takes metformin  total daily dose of 1000 mg daily.  As he is n.p.o. furring SSI, will check a.m. CBG.  CKD stage 3 Seems like baseline  creatinines around 2 since this time last year.  Quick review of prior labs shows some mild microalbuminuria that was transient.  Urinalyses have been bland appearing except for glucosuria.  Blood pressure seems well-controlled.  Not sure if this is diabetes related kidney disease or something else.  SGLT2's have been trialed in the past but she had trouble getting it from the pharmacy.

## 2024-04-05 NOTE — H&P (Incomplete)
 Date: 04/06/2024               Patient Name:  WILLET SCHLEIFER MRN: [id number]  DOB: 1986/08/26 Age / Sex: 38 y.o., male   PCP: Nooruddin, Saad, MD         Medical Service: Internal Medicine Teaching Service         Attending Physician: Dr. Broadus Canes, Whitney Hams, MD    First Contact: Dr. Aurora Lees, DO Pager: [phone]  Second Contact: Dr. Cleven Dallas, DO  Pager: [phone]       After Hours (After 5p/  First Contact Pager: [phone]  weekends / holidays): Second Contact Pager: [phone]   Chief Complaint: left foot surgery   History of Present Illness:  Dajaun Goldring. 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 Patient benefited from reminders about medical history from the chart to provide an HPI, stating that he has "memory problems" at times.   Per patient, has been dealing with a left foot wound for a few months for which he follows with Dr. Rosemarie Conquest at [facility]. States the wound is not painful, as he does not have much sensation to the bottom of the foot. He has been applying topical antibiotic to it and covering it with a gauze pad/medical tape to keep protected while walking. Sometimes has sparse discharge that can be malodorous although not any now. Denies any bleeding. Was seen by Dr. Rosemarie Conquest on 5/29 to review MRI results from March of both feet. Was told to come in to the hospital to get a wound vac placed on his left foot. Clarified with patient that the MRI of the left foot showed concerns for osteomyelitis and Dr. Eliza Gull plan for admission was for surgical intervention to resect any infected bone and debridement of the soft tissue with a possible wound VAC versus closure of the affected area. Patient seemed surprised by mentioning of surgery but  agreeable to proceed with plan. Also clarified that he would continue with home management of his right foot wounds following in-clinic debridement with a surgical blade on 5/29 as the right foot MRI was without evidence of osteomyelitis.   Code Status: Confirmed Full Code Surrogate decision maker: Patient declined surrogate decision maker at this time, stating that he defers decisions to the medical team. He is OK with the team discussing medical care with his father, Gerell Fortson.   ED course:  Patient arrived afebrile, BP 111.74, HR 71, on room air. Denied pain. CBC WBC 11.4, Hgb 11, BMP with Cr 2.10, GFR 41, blood cultures collected. Need to confirm podiatry is aware patient has been admitted.    Meds:  Current Meds  Medication Sig   acyclovir  (ZOVIRAX ) 400 MG tablet Take 400 mg by mouth 2 (two) times daily.   citalopram  (CELEXA ) 20 MG tablet Take 1 tablet (20 mg total) by mouth daily.   ketoconazole  (NIZORAL ) 2 % shampoo APPLY 1 APPLICATION TOPICALLY 2 (TWO) TIMES A WEEK.   metFORMIN  (GLUCOPHAGE -XR) 500 MG 24 hr tablet Take 4 tablets (2,000 mg total) by mouth daily with breakfast. (Patient taking differently: Take 500 mg by mouth 2 (two) times daily with a meal.)   mupirocin  ointment (BACTROBAN ) 2 % Apply 1 Application topically 2 (two) times daily.   [DISCONTINUED]  Bacitracin-Polymyxin B (NEOSPORIN EX) Apply 1 Application topically daily as needed (antibiotic ointment).    Allergies: Allergies as of 04/05/2024 - Review Complete 04/05/2024  Allergen Reaction Noted   Doxycycline  Nausea And Vomiting 12/18/2022   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   Depression 10/31/2016   Diabetes mellitus (HCC)    dx date 2004 at age 53   Low back pain 09/01/2016   Visual snow syndrome    Family History:  Family History  Problem Relation Age of Onset   Depression Mother     Social History:  - Lives alone - Not employed currently, in the  process of obtaining disability  - Independent with ADLs and IADLs, does not drive - Wears glasses, visual impairment on right eye and also with night time vision  - Ambulates without assistance - PCP: Saad Nooruddin - Denies tobacco, alcohol, or illicit drug use   Review of Systems: A complete ROS was negative except as per HPI.   Physical Exam: Blood pressure 116/85, pulse 69, temperature 98.5 F (36.9 C), temperature source Oral, resp. rate 18, height 5\' 11"  (1.803 m), weight 117.2 kg, SpO2 100%. General: Resting in bed comfortably in no acute distress, conversing appropriately Cardiovascular: RRR, no r/m/g, no lower extremity edema Pulmonary: Normal respiratory effort on room air  Abdominal: Bowel sounds present, soft, non-tender, non-distended MSK: Left foot s/p TMA, circular ulceration with granulation tissue near amputation site, no overt bleeding or discharge noted, no particular malodor present, sensation on plantar surface mostly absent, able to dorsiflex/plantarflex on command, extremity warm to touch, dorsalis pedis and posterior tibial pulses present.   Right foot with ulcerations on big toe as well as metatarsal base, no overt bleeding or plantar sensation intact, able to wiggle toes/plantarflex/dorsiflex on command, extremity warm to touch, dorsalis pedis and posterior tibial pulses present.    Skin: Diffuse, well defined plaques on bilateral legs, appear eczematous with possible silvery scale, overlying excoriations present; yellow, erythematous, scaly rash on forehead, scalp line, and bilateral nasal ala.   Psych: Restricted affect, normal mood.  Labs    Latest Ref Rng & Units 04/05/2024    8:37 PM 02/06/2023    2:46 PM 11/27/2022    5:37 AM  CBC  WBC 4.0 - 10.5 K/uL 11.4  9.7  7.0   Hemoglobin 13.0 - 17.0 g/dL 16.1  09.6  9.9   Hematocrit 39.0 - 52.0 % 31.3  40.2  31.3   Platelets 150 - 400 K/uL 250  205  275        Latest Ref Rng & Units 04/05/2024    8:37 PM  01/02/2024    1:33 PM 12/10/2023    3:10 PM  BMP  Glucose 70 - 99 mg/dL 045  409  811   BUN 6 - 20 mg/dL 37  18  27   Creatinine 0.61 - 1.24 mg/dL 9.14  7.82  9.56   BUN/Creat Ratio 9 - 20  9  12    Sodium 135 - 145 mmol/L 130  134  132   Potassium 3.5 - 5.1 mmol/L 4.1  5.0  5.4   Chloride 98 - 111 mmol/L 96  100  97   CO2 22 - 32 mmol/L 22  18  19    Calcium  8.9 - 10.3 mg/dL 9.4  9.5  21.3    Blood cultures x 2 sites in process HIV needs to be collected Iron labs need to  be collected  Assessment & Plan by Problem: Principal Problem:   Diabetic osteomyelitis (HCC) Active Problems:   Diabetes (HCC)   Seborrheic dermatitis   History of transmetatarsal amputation of foot (HCC)   Rash and nonspecific skin eruption   CKD (chronic kidney disease) stage 3, GFR 30-59 ml/min (HCC)  Shaaron Dar. 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   Diabetic osteomyelitis Leukocytosis  Anemia Patient in stable condition, denies pain, no overt discharge or bleeding on exam. Hgb 11, (Hgb ranging 9.8-13.6 over the last year), WBC mildly elevated to 11.4. Low hemoglobin first noticed in 2024 after TMA. Not on oral iron. Evidence of osteomyelitis at the remnant second metatarsal proximal shaft and remnant fifth metatarsal proximal shaft on MRI from 02/27/24. Soft tissue edema was also noted overlying the plantar aspect of the amputation stump, mild atrophy and edema of the intrinsic musculature noted, and trace tenosynovitis of the posterior tibialis tendon. No tissue gas or abscess appreciated. Based on clinical presentation and failure of at home management with wound care and topical antibiotics, podiatry planning on surgical debridement, wound/bone cultures, and possible revision of TMA site on  04/07/24. No preop IV antibiotics prior to cultures collected, will likely need IV antibiotics after culture sensitivities obtained. Can consult ID as needed. May need rehab as he lives alone and with limited social support. 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 Last seen at [facility] by Dr. Sharlon Deacon, at that time was told to continue metformin  2000 mg daily and start Jardiance  10 mg however patient endorses only taking 1000 mg daily. Will be NPO for surgery tomorrow, will defer SSI for now.  Plan - NPO, add diet after surgery tomorrow - Monitor AM CBG - Add SSI with diet  CKD stage 3 Creatinine 2.10, eGFR 41 here. Creatinine seems to be rising over the last year, ranging from 1.49-2.18, CKD 3a/b. Seems like baseline creatinine may be around 2.  Denies any issues with urination. Quick review of prior labs shows some mild microalbuminuria that was transient. Urinalyses have been mostly unremarkable except for glucosuria. Blood pressure seems well-controlled without antihypertensive therapy. Unsure if changes to creatinine may be due to diabetes related kidney disease or something else. Renal ultrasound was ordered in on 08/19/23 for concern for hydronephrosis but was unremarkable. SGLT2's have been trialed in the past but discontinued due to hypotension/nausea/vomiting. Per Dr. Sharlon Deacon, patient agreed to try Jardiance  10 mg with increased hydration at his 5/1 clinic visit but he is not currently taking it as it is not covered by his insurance. Will need to monitor renal function given plan for IV antibiotics after surgery.  Plan - Daily BMP    Rash Seborrheic dermatitis  Bilateral lower extremity rash and facial/scalp rash present. Has been instructed to use  ketoconazole  shampoo in the past for the seborrheic dermatitis but is not currently using it. Leg rash could be psoriasis vs atopic dermatitis vs venous stasis. States leg rash is not bothersome, tries to apply lotion to legs to  help with dryness. Recommended using vaseline/thicker ointment for hydration. Patient to consider use of topical steroid and/or hydroxyzine if rash becomes pruritic. Other than T2DM, no other diagnosis that could weaken immune system or immunodeficiency in chart. Will follow up on HIV antibody given presence of diffuse rash and seborrheic dermatitis.   Chronic health problems Depression: Resumed home citalopram  20 mg daily.  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 Takes acyclovir  400 mg BID. Resumed here.  Diet: NPO Code Status: Full Code VTE prophylaxis: will add after surgery   Disposition prior to discharge: home Anticipated disposition after discharge: home Barriers to discharge: surgery with podiatry, PT/OT evaluation   Dispo: Admit patient to Observation with expected length of stay less than 2 midnights.  Signed: Arellano Zameza, Cynthea Drier, MD 04/06/2024, 4:34 AM  After 5pm on weekdays and 1pm on weekends: On Call pager: [phone]

## 2024-04-06 ENCOUNTER — Inpatient Hospital Stay (HOSPITAL_COMMUNITY)

## 2024-04-06 ENCOUNTER — Encounter (HOSPITAL_COMMUNITY): Payer: Self-pay | Admitting: Internal Medicine

## 2024-04-06 ENCOUNTER — Inpatient Hospital Stay (HOSPITAL_COMMUNITY): Admitting: Certified Registered Nurse Anesthetist

## 2024-04-06 ENCOUNTER — Encounter (HOSPITAL_COMMUNITY)
Admission: EM | Disposition: A | Discharge status: Home / Self Care | Payer: Self-pay | Source: Home / Self Care | Attending: Emergency Medicine

## 2024-04-06 ENCOUNTER — Other Ambulatory Visit: Payer: Self-pay

## 2024-04-06 DIAGNOSIS — T148XXA Other injury of unspecified body region, initial encounter: Principal | ICD-10-CM

## 2024-04-06 DIAGNOSIS — E1159 Type 2 diabetes mellitus with other circulatory complications: Secondary | ICD-10-CM | POA: Diagnosis not present

## 2024-04-06 DIAGNOSIS — M869 Osteomyelitis, unspecified: Secondary | ICD-10-CM

## 2024-04-06 DIAGNOSIS — F32A Depression, unspecified: Secondary | ICD-10-CM

## 2024-04-06 DIAGNOSIS — N183 Chronic kidney disease, stage 3 unspecified: Secondary | ICD-10-CM | POA: Diagnosis not present

## 2024-04-06 DIAGNOSIS — M86472 Chronic osteomyelitis with draining sinus, left ankle and foot: Secondary | ICD-10-CM | POA: Diagnosis not present

## 2024-04-06 DIAGNOSIS — E1169 Type 2 diabetes mellitus with other specified complication: Secondary | ICD-10-CM

## 2024-04-06 HISTORY — PX: METATARSAL HEAD EXCISION: SHX5027

## 2024-04-06 LAB — IRON AND TIBC
Iron: 61 ug/dL (ref 45–182)
Saturation Ratios: 17 % — ABNORMAL LOW (ref 17.9–39.5)
TIBC: 360 ug/dL (ref 250–450)
UIBC: 299 ug/dL

## 2024-04-06 LAB — HIV ANTIBODY (ROUTINE TESTING W REFLEX): HIV Screen 4th Generation wRfx: NONREACTIVE

## 2024-04-06 LAB — GLUCOSE, CAPILLARY
Glucose-Capillary: 151 mg/dL — ABNORMAL HIGH (ref 70–99)
Glucose-Capillary: 145 mg/dL — ABNORMAL HIGH (ref 70–99)

## 2024-04-06 LAB — FERRITIN: Ferritin: 89 ng/mL (ref 24–336)

## 2024-04-06 SURGERY — EXCISION, METATARSAL BONE, HEAD
Anesthesia: Monitor Anesthesia Care | Site: Toe | Laterality: Left

## 2024-04-06 MED ORDER — ENOXAPARIN SODIUM 60 MG/0.6ML IJ SOSY
60.0000 mg | PREFILLED_SYRINGE | INTRAMUSCULAR | Status: DC
  Administered 2024-04-07 – 2024-04-08 (×2): 60 mg via SUBCUTANEOUS
  Filled 2024-04-06 (×3): qty 0.6

## 2024-04-06 MED ORDER — BUPIVACAINE HCL (PF) 0.5 % IJ SOLN
INTRAMUSCULAR | Status: AC
  Filled 2024-04-06: qty 10

## 2024-04-06 MED ORDER — PROPOFOL 10 MG/ML IV BOLUS
INTRAVENOUS | Status: DC | PRN
  Administered 2024-04-06: 75 ug/kg/min via INTRAVENOUS
  Administered 2024-04-06: 20 mg via INTRAVENOUS

## 2024-04-06 MED ORDER — ACETAMINOPHEN 10 MG/ML IV SOLN: 1000.0000 mg | Freq: Once | INTRAVENOUS | Status: DC | PRN

## 2024-04-06 MED ORDER — CHLORHEXIDINE GLUCONATE 0.12 % MT SOLN: 15.0000 mL | Freq: Once | OROMUCOSAL | Status: AC

## 2024-04-06 MED ORDER — AMOXICILLIN-POT CLAVULANATE 875-125 MG PO TABS
1.0000 | ORAL_TABLET | Freq: Two times a day (BID) | ORAL | Status: DC
  Administered 2024-04-06 – 2024-04-08 (×5): 1 via ORAL
  Filled 2024-04-06 (×5): qty 1

## 2024-04-06 MED ORDER — SODIUM CHLORIDE 0.9 % IV SOLN: INTRAVENOUS | Status: DC

## 2024-04-06 MED ORDER — VANCOMYCIN HCL 1000 MG IV SOLR
INTRAVENOUS | Status: DC | PRN
  Administered 2024-04-06: 1000 mg via INTRAVENOUS

## 2024-04-06 MED ORDER — LIDOCAINE HCL (PF) 1 % IJ SOLN
INTRAMUSCULAR | Status: AC
  Filled 2024-04-06: qty 5

## 2024-04-06 MED ORDER — ACETAMINOPHEN 325 MG PO TABS: 650.0000 mg | ORAL_TABLET | Freq: Four times a day (QID) | ORAL | Status: DC | PRN

## 2024-04-06 MED ORDER — OXYCODONE HCL 5 MG PO TABS: 5.0000 mg | ORAL_TABLET | Freq: Once | ORAL | Status: DC | PRN

## 2024-04-06 MED ORDER — MIDAZOLAM HCL 2 MG/2ML IJ SOLN
INTRAMUSCULAR | Status: AC
  Filled 2024-04-06: qty 2

## 2024-04-06 MED ORDER — EPHEDRINE SULFATE-NACL 50-0.9 MG/10ML-% IV SOSY
PREFILLED_SYRINGE | INTRAVENOUS | Status: DC | PRN
  Administered 2024-04-06 (×2): 5 mg via INTRAVENOUS

## 2024-04-06 MED ORDER — FENTANYL CITRATE (PF) 250 MCG/5ML IJ SOLN
INTRAMUSCULAR | Status: DC | PRN
  Administered 2024-04-06: 25 ug via INTRAVENOUS

## 2024-04-06 MED ORDER — ORAL CARE MOUTH RINSE: 15.0000 mL | Freq: Once | OROMUCOSAL | Status: AC

## 2024-04-06 MED ORDER — VANCOMYCIN HCL 1000 MG IV SOLR
INTRAVENOUS | Status: DC | PRN
  Administered 2024-04-06: 1000 mg via TOPICAL

## 2024-04-06 MED ORDER — LINEZOLID 600 MG PO TABS
600.0000 mg | ORAL_TABLET | Freq: Two times a day (BID) | ORAL | Status: DC
  Administered 2024-04-06 – 2024-04-08 (×4): 600 mg via ORAL
  Filled 2024-04-06 (×4): qty 1

## 2024-04-06 MED ORDER — VANCOMYCIN HCL 1000 MG IV SOLR
INTRAVENOUS | Status: AC
  Filled 2024-04-06: qty 20

## 2024-04-06 MED ORDER — FENTANYL CITRATE (PF) 100 MCG/2ML IJ SOLN: 25.0000 ug | INTRAMUSCULAR | Status: DC | PRN

## 2024-04-06 MED ORDER — OXYCODONE HCL 5 MG PO TABS: 5.0000 mg | ORAL_TABLET | Freq: Four times a day (QID) | ORAL | Status: DC | PRN

## 2024-04-06 MED ORDER — OXYCODONE HCL 5 MG/5ML PO SOLN: 5.0000 mg | Freq: Once | ORAL | Status: DC | PRN

## 2024-04-06 MED ORDER — CHLORHEXIDINE GLUCONATE 0.12 % MT SOLN
OROMUCOSAL | Status: AC
  Administered 2024-04-06: 15 mL via OROMUCOSAL
  Filled 2024-04-06: qty 15

## 2024-04-06 MED ORDER — ENOXAPARIN SODIUM 40 MG/0.4ML IJ SOSY: 40.0000 mg | PREFILLED_SYRINGE | INTRAMUSCULAR | Status: DC

## 2024-04-06 MED ORDER — LIDOCAINE HCL (CARDIAC) PF 100 MG/5ML IV SOSY
PREFILLED_SYRINGE | INTRAVENOUS | Status: DC | PRN
  Administered 2024-04-06: 40 mg via INTRAVENOUS

## 2024-04-06 MED ORDER — DROPERIDOL 2.5 MG/ML IJ SOLN: 0.6250 mg | Freq: Once | INTRAMUSCULAR | Status: DC | PRN

## 2024-04-06 MED ORDER — MIDAZOLAM HCL 2 MG/2ML IJ SOLN
INTRAMUSCULAR | Status: DC | PRN
Start: 2024-04-06 — End: 2024-04-06
  Administered 2024-04-06: 2 mg via INTRAVENOUS

## 2024-04-06 MED ORDER — LIDOCAINE HCL (PF) 1 % IJ SOLN
INTRAMUSCULAR | Status: DC | PRN
  Administered 2024-04-06: 10 mL via INTRADERMAL

## 2024-04-06 MED ORDER — FENTANYL CITRATE (PF) 250 MCG/5ML IJ SOLN
INTRAMUSCULAR | Status: AC
Start: 2024-04-06 — End: ?
  Filled 2024-04-06: qty 5

## 2024-04-06 SURGICAL SUPPLY — 24 items
BLADE AVERAGE 25X9 (BLADE) IMPLANT
BNDG COMPR ESMARK 4X3 LF (GAUZE/BANDAGES/DRESSINGS) IMPLANT
BNDG ELASTIC 4INX 5YD STR LF (GAUZE/BANDAGES/DRESSINGS) IMPLANT
BNDG GAUZE DERMACEA FLUFF 4 (GAUZE/BANDAGES/DRESSINGS) IMPLANT
CNTNR URN SCR LID CUP LEK RST (MISCELLANEOUS) IMPLANT
CUFF TOURN SGL QUICK 18X4 (TOURNIQUET CUFF) IMPLANT
DRSG XEROFORM 1X8 (GAUZE/BANDAGES/DRESSINGS) IMPLANT
ELECTRODE REM PT RTRN 9FT ADLT (ELECTROSURGICAL) IMPLANT
GAUZE PAD ABD 8X10 STRL (GAUZE/BANDAGES/DRESSINGS) IMPLANT
GAUZE SPONGE 4X4 12PLY STRL (GAUZE/BANDAGES/DRESSINGS) IMPLANT
GLOVE INDICATOR 7.0 STRL GRN (GLOVE) IMPLANT
GLOVE SURG SS PI 7.0 STRL IVOR (GLOVE) IMPLANT
GOWN SPEC L4 XLG W/TWL (GOWN DISPOSABLE) IMPLANT
KIT BASIN OR (CUSTOM PROCEDURE TRAY) IMPLANT
NDL HYPO 25X1 1.5 SAFETY (NEEDLE) IMPLANT
NEEDLE HYPO 25X1 1.5 SAFETY (NEEDLE) IMPLANT
PACK ORTHO EXTREMITY (CUSTOM PROCEDURE TRAY) IMPLANT
PENCIL BUTTON HOLSTER BLD 10FT (ELECTRODE) IMPLANT
SET HNDPC FAN SPRY TIP SCT (DISPOSABLE) IMPLANT
STAPLER SKIN PROX WIDE 3.9 (STAPLE) IMPLANT
SUT PROLENE 2 0 SH DA (SUTURE) IMPLANT
SUT PROLENE 3 0 PS 2 (SUTURE) IMPLANT
SYR 10ML LL (SYRINGE) IMPLANT
TOWEL GREEN STERILE (TOWEL DISPOSABLE) IMPLANT

## 2024-04-06 NOTE — Plan of Care (Signed)

## 2024-04-06 NOTE — Op Note (Signed)
 Full Operative Report  Date of Operation: 11:14 AM, 04/06/2024   Patient: Carl Conley - 38 y.o. male  Surgeon: Evertt Hoe, DPM   Assistant: None  Diagnosis: Chronic Osteomyelitis of left foot  Procedure:  1.  Revision of transmetatarsal to Lisfranc amputation, left foot 2.  Excision of ulceration and complex wound closure 4 x 2 cm, left foot    Anesthesia: Monitor Anesthesia Care  No responsible provider has been recorded for the case.  Anesthesiologist: Lethaniel Rave, MD CRNA: Grier Leber, CRNA   Estimated Blood Loss: Minimal   Hemostasis: 1) Anatomical dissection, mechanical compression, electrocautery 2) ankle tourniquet inflated 250 mmHg approximately 25 minutes  Implants: * No implants in log *  Materials: Prolene 2-0, 3-0, Staples  Injectables: 1) Pre-operatively: 10 cc of 50:50 mixture 1%lidocaine  plain and 0.5% marcaine  plain 2) Post-operatively: None   Specimens: - Pathology: Metatarsal remnants 1 through 5 for pathology - Microbiology: Excised ulceration;  second metatarsal bone   Antibiotics: IV antibiotics were held until after cultures obtained.  1 g of vancomycin  given postoperatively  Drains: None  Complications: Patient tolerated the procedure well without complication.   Operative findings: As below in detailed report  Indications for Procedure: Carl Conley presents to Evertt Hoe, DPM with a chief complaint of chronic ulceration of the plantar aspect of the left foot with concern for underlying osteomyelitis in the second and possibly the fifth metatarsal remnant.  the patient has failed conservative treatments of various modalities. At this time the patient has elected to proceed with surgical correction. All alternatives, risks, and complications of the procedures were thoroughly explained to the patient. Patient exhibits appropriate understanding of all discussion points and informed consent was signed and  obtained in the chart with no guarantees to surgical outcome given or implied.  Description of Procedure: Patient was brought to the operating room. Patient remained on their hospital bed in the supine position. A surgical timeout was performed and all members of the operating room, the procedure, and the surgical site were identified. anesthesia occurred as per anesthesia record. Local anesthetic as previously described was then injected about the operative field in a local infiltrative block.  The operative lower extremity as noted above was then prepped and draped in the usual sterile manner. The following procedure then began.  Attention was directed to the left plantar midfoot.  Ulceration underlying the second metatarsal stump measured approximately 4 x 2 cm.  10 blade was used to excise the entire ulceration down to subcutaneous fat tissue.  The ulceration and excised tissue underlying was passed the back table and sent for culture.  Next the 10 blade was used to make a transverse incision along the course of the prior transmetatarsal amputation at the distal aspect of the left midfoot.  This was carried from the first metatarsal to the fifth metatarsal.  Soft tissue flap was then raised dorsal and plantar overlying the residual metatarsal stumps.  10 blade and 15 blade was used to dissect around the remaining metatarsal stump which were exposed in entirety.  The 1st through 5th metatarsal was then disarticulated at the tarsometatarsal joints.  These bones were then passed the back table and sent for pathology.  Bone culture was harvested separately off the second metatarsal stump site distally with rongeur which was sent for microbiology.  Next the entire surgical site was irrigated with 3 L of sterile saline via power pulse lavage.  No evidence of further osseous or  soft tissue infection remained at this time.  1 g of vancomycin  powder was then spread within the surgical cavity.    There was  a large  soft tissue deficit at the plantar aspect of the midfoot given excision of ulceration 4 x 2 cm however after the first of fifth metatarsal remnants have been resected there was adequate soft tissue available for closure.  Additional soft tissue resection was carried out as needed so as to have a suitable bilobed medial and lateral plantar flap and a dorsal flap that were well-approximated under minimal tension.  Then using combination of 2-0 3-0 Prolene and skin staples the bilobed plantar flap was brought into approximation with each other and with the dorsal flap and closed in layered fashion under minimal tension.  The tourniquet was released and there was noted to be good capillary return to the plantar and dorsal flap.  The surgical site was then dressed with Xeroform 4 x 4 gauze ABD pad Kerlix Ace wrap. The patient tolerated both the procedure and anesthesia well with vital signs stable throughout. The patient was transferred in good condition and all vital signs stable  from the OR to recovery under the discretion of anesthesia.  Condition: Vital signs stable, neurovascular status unchanged from preoperative   Surgical plan:  Expect clean surgical margin all remaining metatarsal remnants were excised in total.  No further osseous infection suspected.  Soft tissue flap will be high risk given neuropathy and bi lobe flap closure.  Strict nonweightbearing.  Recommend 7 days p.o. antibiotics based on culture data.  The patient will be nonweightbearing in a soft dressing and postop shoe for protection to the operative limb until further instructed. The dressing is to remain clean, dry, and intact. Will continue to follow unless noted elsewhere.   Russ Course, DPM [facility]

## 2024-04-06 NOTE — Progress Notes (Signed)
 Called for report x2. No answer. Sent msg to floor nurse with call back #[phone]. Transport sent for pt.

## 2024-04-06 NOTE — Progress Notes (Signed)
 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 Pt underwent L TMA revision on 04/06/24 and will be NWB for 2-3 weeks.  Clinical Impression  Pt admitted with/for management of L foot osteomyelitis, s/p L Lisfranc amp.  Pt currently mobilizing in room at a CGA to min assist level.  His home environment poses a challenge for L LE NWB.  Will assess tomorrow .  Pt currently limited functionally due to the problems listed below.  (see problems list.)  Pt will benefit from PT to maximize function and safety to be able to get home safely with available assist .         If plan is discharge home, recommend the following: A little help with walking and/or transfers;A little help with bathing/dressing/bathroom;Assistance with cooking/housework;Assist for transportation;Help with stairs or ramp for entrance   Can travel by private vehicle        Equipment Recommendations None recommended by PT  Recommendations for Other Services       Functional Status Assessment Patient has had a recent decline in their functional status and demonstrates the ability to make significant improvements in function in a reasonable and predictable amount of time.     Precautions / Restrictions Precautions Precautions: Fall Recall of Precautions/Restrictions: Intact Restrictions Weight Bearing Restrictions Per Provider Order: Yes LLE Weight Bearing Per Provider Order: Non weight bearing      Mobility  Bed Mobility Overal bed mobility: Modified Independent             General bed mobility comments: returned to bed and repositioned himself in bed without assist.    Transfers Overall transfer level: Needs assistance Equipment used: Rolling walker  (2 wheels) Transfers: Sit to/from Stand Sit to Stand: Contact guard assist           General transfer comment: cues for best technique, minor stability assist    Ambulation/Gait Ambulation/Gait assistance: Contact guard assist, Min assist Gait Distance (Feet): 20 Feet Assistive device: Rolling walker (2 wheels) Gait Pattern/deviations: Step-to pattern   Gait velocity interpretation: <1.31 ft/sec, indicative of household ambulator   General Gait Details: "swing to" pattern, generally steady with the RW.  able to stand statically and converse and otherwise stay NWB for a 3 min period  Stairs            Wheelchair Mobility     Tilt Bed    Modified Rankin (Stroke Patients Only)       Balance Overall balance assessment: Needs assistance, Mild deficits observed, not formally tested Sitting-balance support: Bilateral upper extremity supported Sitting balance-Leahy Scale: Good     Standing balance support: No upper extremity supported, During functional activity Standing balance-Leahy Scale: Poor Standing balance comment: reliant on AD if to remain NWB on the L LE                             Pertinent Vitals/Pain Pain Assessment Pain Assessment: Faces Faces Pain Scale: No hurt Pain Intervention(s): Monitored during session    Home Living Family/patient expects to be discharged to:: Private residence Living Arrangements: Alone Available Help at Discharge: Family;Friend(s);Available PRN/intermittently Type of Home: House Home Access: Stairs to enter Entrance Stairs-Rails: None Entrance Stairs-Number of Steps: 2 -  front porch Alternate Level Stairs-Number of Steps: full flight Home Layout: Two level;1/2 bath on main level;Bed/bath upstairs Home Equipment: Agricultural consultant (2 wheels)      Prior Function Prior Level of Function : Independent/Modified Independent                     Extremity/Trunk Assessment   Upper Extremity  Assessment Upper Extremity Assessment: Overall WFL for tasks assessed;Right hand dominant    Lower Extremity Assessment Lower Extremity Assessment: Defer to PT evaluation LLE Deficits / Details: lisfranc amp, pt able to move L LE well against gravity and maintain NWB for short distances    Cervical / Trunk Assessment Cervical / Trunk Assessment: Normal  Communication   Communication Communication: No apparent difficulties    Cognition Arousal: Alert Behavior During Therapy: WFL for tasks assessed/performed   PT - Cognitive impairments: No apparent impairments                         Following commands: Intact       Cueing Cueing Techniques: Verbal cues     General Comments General comments (skin integrity, edema, etc.): Discussed weightbearing status and how it will effect pt's ability to navigate stairs at home. Pt does have an option to remain on first floor and sleep on the couch. Closet living relative lives 1 hour away. It's possible that he may be able to stay with them temporarily when discharged.    Exercises     Assessment/Plan    PT Assessment Patient needs continued PT services  PT Problem List Decreased strength;Decreased activity tolerance;Decreased balance;Decreased mobility;Decreased knowledge of use of DME       PT Treatment Interventions DME instruction;Gait training;Functional mobility training;Therapeutic activities;Balance training;Patient/family education    PT Goals (Current goals can be found in the Care Plan section)  Acute Rehab PT Goals Patient Stated Goal: safe at home, staying on the 1st floor while NWB PT Goal Formulation: With patient Time For Goal Achievement: 04/13/24 Potential to Achieve Goals: Good    Frequency Min 4X/week     Co-evaluation               AM-PAC PT "6 Clicks" Mobility  Outcome Measure Help needed turning from your back to your side while in a flat bed without using bedrails?: None Help needed  moving from lying on your back to sitting on the side of a flat bed without using bedrails?: None Help needed moving to and from a bed to a chair (including a wheelchair)?: A Little Help needed standing up from a chair using your arms (e.g., wheelchair or bedside chair)?: A Little Help needed to walk in hospital room?: A Little Help needed climbing 3-5 steps with a railing? : A Lot 6 Click Score: 19    End of Session   Activity Tolerance: Patient tolerated treatment well Patient left: in bed;with call bell/phone within reach;Other (comment) (left pt with a radiogy technician) Nurse Communication: Mobility status PT Visit Diagnosis: Other abnormalities of gait and mobility (R26.89);Difficulty in walking, not elsewhere classified (R26.2)    Time: 1444-1510 PT Time Calculation (min) (ACUTE ONLY): 26 min   Charges:   PT Evaluation $PT Eval Moderate Complexity: 1 Mod PT Treatments $Self Care/Home Management: 8-22 PT General Charges $$ ACUTE PT VISIT: 1 Visit         04/06/2024  Nohemi Batters., PT Acute Rehabilitation Services [phone]  (office)  Durell Gilding Yunior Jain 04/06/2024, 4:58 PM

## 2024-04-06 NOTE — Anesthesia Preprocedure Evaluation (Addendum)
 Anesthesia Evaluation  Patient identified by MRN, date of birth, ID band Patient awake    Reviewed: Allergy & Precautions, H&P , NPO status , Patient's Chart, lab work & pertinent test results  Airway Mallampati: II  TM Distance: >3 FB Neck ROM: Full    Dental no notable dental hx. (+) Chipped,    Pulmonary neg pulmonary ROS   Pulmonary exam normal breath sounds clear to auscultation       Cardiovascular (-) angina (-) Past MI negative cardio ROS Normal cardiovascular exam Rhythm:Regular Rate:Normal     Neuro/Psych neg Seizures PSYCHIATRIC DISORDERS  Depression    negative neurological ROS     GI/Hepatic negative GI ROS, Neg liver ROS,,,NAFLD   Endo/Other  diabetes, Type 2    Renal/GU Renal InsufficiencyRenal disease  negative genitourinary   Musculoskeletal negative musculoskeletal ROS (+)    Abdominal   Peds negative pediatric ROS (+)  Hematology  (+) Blood dyscrasia, anemia   Anesthesia Other Findings   Reproductive/Obstetrics negative OB ROS                             Anesthesia Physical Anesthesia Plan  ASA: 3  Anesthesia Plan: MAC   Post-op Pain Management:    Induction: Intravenous  PONV Risk Score and Plan: 1 and Propofol  infusion and Treatment may vary due to age or medical condition  Airway Management Planned: Natural Airway  Additional Equipment:   Intra-op Plan:   Post-operative Plan:   Informed Consent: I have reviewed the patients History and Physical, chart, labs and discussed the procedure including the risks, benefits and alternatives for the proposed anesthesia with the patient or authorized representative who has indicated his/her understanding and acceptance.     Dental advisory given  Plan Discussed with: CRNA  Anesthesia Plan Comments:         Anesthesia Quick Evaluation

## 2024-04-06 NOTE — Transfer of Care (Signed)
 Immediate Anesthesia Transfer of Care Note  Patient: LISTON THUM  Procedure(s) Performed: EXCISION, METATARSAL BONE, HEAD (Left: Toe)  Patient Location: PACU  Anesthesia Type:MAC  Level of Consciousness: awake, alert , oriented, and patient cooperative  Airway & Oxygen Therapy: Patient Spontanous Breathing  Post-op Assessment: Report given to RN and Post -op Vital signs reviewed and stable  Post vital signs: Reviewed and stable  Last Vitals:  Vitals Value Taken Time  BP 103/64 04/06/24 1110  Temp 36.5 C 04/06/24 1110  Pulse 66 04/06/24 1111  Resp 13 04/06/24 1111  SpO2 96 % 04/06/24 1111  Vitals shown include unfiled device data.  Last Pain:  Vitals:   04/06/24 0924  TempSrc:   PainSc: 0-No pain      Patients Stated Pain Goal: 0 (04/06/24 0924)  Complications: No notable events documented.

## 2024-04-06 NOTE — TOC CM/SW Note (Signed)
 Transition of Care [facility]) - Inpatient Brief Assessment   Patient Details  Name: Carl Conley MRN: [id number] Date of Birth: November 17, 1985  Transition of [facility]) CM/SW Contact:    Juliane Och, LCSW Phone Number: 04/06/2024, 9:13 AM   Clinical Narrative:  9:13 AM Per chart review, patient resides at home. Patient has a PCP and insurance. Patient has CIR but not SNF history. Patient has HH history with [facility]. Patient has DME (rolling walker, 3 in 1, walker, BSC) with [employer]. Patent has home IV antibiotics therapy history with [facility]. No TOC needs were identified at this time. TOC will continue to follow and be available to assist.  Transition of Care Asessment: Insurance and Status: Insurance coverage has been reviewed Patient has primary care physician: Yes Home environment has been reviewed: Private Residence Prior level of function:: N/A Prior/Current Home Services: No current home services (Has HH/DME history) Social Drivers of Health Review: SDOH reviewed no interventions necessary Readmission risk has been reviewed: Yes Transition of care needs: no transition of care needs at this time

## 2024-04-06 NOTE — Consult Note (Addendum)
 PODIATRY CONSULTATION  NAME Carl Conley MRN [id number] DOB May 19, 1986 DOA 04/05/2024   Reason for consult:  Left foot ulcer, osteomyelitis  Attending/Consulting physician: W. Winfrey MD  History of present illness: "Carl Conley. 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 Patient benefited from reminders about medical history from the chart to provide an HPI, stating that he has "memory problems" at times.    Per patient, has been dealing with a left foot wound for a few months for which he follows with Dr. Rosemarie Conley at [facility]. States the wound is not painful, as he does not have much sensation to the bottom of the foot. He has been applying topical antibiotic to it and covering it with a gauze pad/medical tape to keep protected while walking. Sometimes has sparse discharge that can be malodorous although not any now. Denies any bleeding. Was seen by Dr. Rosemarie Conley on 5/29 to review MRI results from March of both feet. Was told to come in to the hospital to get a wound vac placed on his left foot. Clarified with patient that the MRI of the left foot showed concerns for osteomyelitis and Dr. Eliza Conley plan for admission was for surgical intervention to resect any infected bone and debridement of the soft tissue with a possible wound VAC versus closure of the affected area. Patient seemed surprised by mentioning of surgery but agreeable to proceed with plan. Also clarified that he would continue with home management of his right foot wounds following in-clinic debridement with a surgical blade on 5/29 as the right foot MRI was without evidence of osteomyelitis. "  Pt well known to me from clinic, please see clinic notes most recently 5/29. 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  Plan for revision of TMA on left foot to resect infected bone and apply vac for wound healing.     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   Depression 10/31/2016   Diabetes mellitus (HCC)    dx date 2004 at age 83   Low back pain 09/01/2016   Visual snow syndrome        Latest Ref Rng & Units 04/05/2024    8:37 PM 02/06/2023    2:46 PM 11/27/2022    5:37 AM  CBC  WBC 4.0 - 10.5 K/uL 11.4  9.7  7.0   Hemoglobin 13.0 - 17.0 g/dL 40.9  81.1  9.9   Hematocrit 39.0 - 52.0 % 31.3  40.2  31.3   Platelets 150 - 400 K/uL 250  205  275        Latest Ref Rng & Units 04/05/2024    8:37 PM 01/02/2024    1:33 PM 12/10/2023    3:10 PM  BMP  Glucose 70 - 99 mg/dL 914  782  956   BUN 6 - 20 mg/dL 37  18  27   Creatinine 0.61 - 1.24 mg/dL 2.13  0.86  5.78   BUN/Creat Ratio 9 - 20  9  12    Sodium 135 - 145 mmol/L 130  134  132   Potassium 3.5 - 5.1 mmol/L 4.1  5.0  5.4  Chloride 98 - 111 mmol/L 96  100  97   CO2 22 - 32 mmol/L 22  18  19    Calcium  8.9 - 10.3 mg/dL 9.4  9.5  40.9       Physical Exam: General: AAO x3, NAD   Dermatological: Circular ulceration plantar distal aspect of the left amputation site.  Erythema and edema.  No active drainage mild malodor present.  Wound has a healthy granular tissue base.     Right foot with ulceration subhallux without evidence of significant infection there is hyperkeratotic tissue overlying and surrounding the wound.  Healthy granular tissue base here.  Negative probe to bone.  Also has a small very superficial tiny scab at the lateral aspect of the right ankle lateral malleolus without evidence of infection appears to be healing.     Vascular:  Dorsalis Pedis artery and Posterior Tibial artery pedal pulses are 2/4 bilateral.  Capillary fill time < 3 sec to all digits.    Neruologic: Grossly absent via light touch  distal forefoot bilaterally   Musculoskeletal: Status post left foot TMA with erythema and edema of the left stump site   Gait: Unassisted, Nonantalgic.    Radiographs:  Date: 01/09/2024 XR both feet Weightbearing AP/Lateral/Oblique   Findings: Possible osteolysis of the plantar aspect of the IPJ seen on the right foot lateral.  Concern for erosions concerning for osteomyelitis.   On the left foot evidence of proximal transmetatarsal amputation no obvious evidence of erosions however cannot rule out.  Soft tissue edema is noted.  No soft tissue gas   MRI L Foot: Erythema with corresponding T1 hypointensity at the remnant second metatarsal proximal shaft as well as fifth metatarsal proximal shaft concerning for possible osteomyelitis.  No abscess identified   MRI R Foot: Soft tissue ulceration of the plantar great toe no marrow abnormality of the proximal or distal phalanx evidence of osteomyelitis.  Erosion changes and collapse of the distal third metatarsal head possibly posttraumatic versus Charcot arthropathy less likely infection.  Erosive changes lateral base of the cuboid is new concern for inflammatory arthropathy versus osteomyelitis    ASSESSMENT/PLAN OF CARE 38 y.o. 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   WBC 11.4 ESR/CRP: p  Imaging as above - MRI L foot with evidence OM 2nd metatarsal stump  - NPO for OR today for I&D with resection of infected metatarsal remnants, ulceration excision and possible wound closure vs wound vac and possible graft application. He agreed to proceed.  - Begin broad spectrum abx post op after cultures obtained.  - Anticoagulation: Hold pending OR - Wound care: Right foot xeroform and  gauze dressing. Left foot leave surgical dressing C/D/I - WB status: WBAT BLE - Will continue to follow   Thank you for the consult.  Please contact me directly with any questions or concerns.           Carl Conley, DPM [facility] / [facility]    [address], [place] [address]  Office [phone]  Fax [phone]

## 2024-04-06 NOTE — Evaluation (Signed)
 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 Pt underwent L TMA revision on 04/06/24 and will be NWB for 2-3 weeks.     Clinical Impressions Patient evaluated by Occupational Therapy with no further acute OT needs identified. All education has been completed and the patient has no further questions. Pt education provided on weight bearing status and how it will effect his ability to navigate his home environment. Surgeon recommending that pt be NWB to LLE for 2-3 weeks. Pt does have a relative he may be able to stay with temporarily if needed and will need to speak with them.No follow-up Occupational Therapy or equipment needs. OT is signing off. Thank you for this referral.      If plan is discharge home, recommend the following:   Assist for transportation;Assistance with cooking/housework;Help with stairs or ramp for entrance     Functional Status Assessment   Patient has had a recent decline in their functional status and demonstrates the ability to make significant improvements in function in a reasonable and predictable amount of time.     Equipment Recommendations   None recommended by OT      Precautions/Restrictions   Precautions Precautions: Fall Recall of Precautions/Restrictions: Intact Restrictions Weight Bearing Restrictions Per Provider Order: Yes LLE Weight Bearing Per Provider Order: Non weight bearing Other Position/Activity Restrictions: Surgeon recommended NWB status for 2-3 weeks     Mobility Bed Mobility Overal bed mobility: Modified Independent       Transfers Overall transfer level: Needs assistance Equipment used: Rolling walker (2 wheels) Transfers: Sit to/from  Stand, Bed to chair/wheelchair/BSC Sit to Stand: Contact guard assist    General transfer comment: VC for hand placement with RW management prior to sit to stand transition. 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 dark mood)            Pertinent Vitals/Pain Pain Assessment Pain Assessment: No/denies pain     Extremity/Trunk Assessment Upper Extremity Assessment Upper Extremity Assessment: Overall WFL for tasks assessed;Right hand dominant   Lower Extremity Assessment Lower Extremity Assessment: Defer to PT evaluation LLE Deficits / Details: lisfranc amp, pt able to move L LE well against gravity and maintain NWB for short distances   Cervical / Trunk Assessment Cervical / Trunk Assessment: Normal   Communication Communication Communication: No apparent difficulties   Cognition Arousal: Alert Behavior During Therapy: WFL for tasks assessed/performed Cognition: No apparent impairments     Following commands: Intact  Cueing  General Comments   Cueing Techniques: Verbal cues  Discussed weightbearing status and how it  will effect pt's ability to navigate stairs at home. Pt does have an option to remain on first floor and sleep on the couch. Closet living relative lives 1 hour away. It's possible that he may be able to stay with them temporarily when discharged.           Home Living Family/patient expects to be discharged to:: Private residence Living Arrangements: Alone Available Help at Discharge: Family;Friend(s);Available PRN/intermittently Type of Home: House Home Access: Stairs to enter [employer] of Steps: 2 - front porch Entrance Stairs-Rails: None Home Layout: Two level;1/2 bath on main level;Bed/bath upstairs Alternate Level Stairs-Number of Steps: full flight   Bathroom Shower/Tub: Tub/shower unit;Door   [employer] Toilet: Standard Bathroom Accessibility: Yes How Accessible: Accessible via walker Home Equipment: Agricultural consultant (2 wheels)          Prior Functioning/Environment Prior Level of Function : Independent/Modified Independent     OT Problem List: Impaired balance (sitting and/or standing)        OT Goals(Current goals can be found in the care plan section)   Acute Rehab OT Goals OT Goal Formulation: All assessment and education complete, DC therapy   OT Frequency:   1X visit       AM-PAC OT "6 Clicks" Daily Activity     Outcome Measure Help from another person eating meals?: None Help from another person taking care of personal grooming?: None Help from another person toileting, which includes using toliet, bedpan, or urinal?: None Help from another person bathing (including washing, rinsing, drying)?: None Help from another person to put on and taking off regular upper body clothing?: None Help from another person to put on and taking off regular lower body clothing?: None 6 Click Score: 24   End of Session Equipment Utilized During Treatment: Rolling walker (2 wheels)  Activity Tolerance: Patient tolerated treatment well Patient left: in  bed;with call bell/phone within reach;with bed alarm set;Other (comment) (hand off with radiology tech)  OT Visit Diagnosis: Muscle weakness (generalized) (M62.81)                Time: 7829-5621 OT Time Calculation (min): 44 min Charges:  OT General Charges $OT Visit: 1 Visit OT Evaluation $OT Eval Moderate Complexity: 1 Mod OT Treatments $Self Care/Home Management : 23-37 mins  Carollee Circle, OTR/L,CBIS  Supplemental OT - [facility] and [facility] Secure Chat Preferred    [facility], Ocie Belt 04/06/2024, 4:32 PM

## 2024-04-06 NOTE — Progress Notes (Signed)
 Orthopedic Tech Progress Note Patient Details:  Carl Conley [birth year] [id number]  Ortho Devices Type of Ortho Device: Postop shoe/boot Ortho Device/Splint Location: LLE Ortho Device/Splint Interventions: Ordered, Application, Adjustment   Post Interventions Patient Tolerated: Well Instructions Provided: Care of device  [name] 04/06/2024, 12:02 PM

## 2024-04-06 NOTE — Progress Notes (Addendum)
 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   Currently, he reports doing "alright". He denies pain in the left foot at this time. He denies nausea/vomiting as well. He is ready to order lunch.   Objective:  Vital signs in last 24 hours: Vitals:   04/06/24 1110 04/06/24 1115 04/06/24 1130 04/06/24 1146  BP: 103/64 106/70 114/68 110/74  Pulse: 77 73 64 69  Resp: 14 15 14 18   Temp: 97.7 F (36.5 C)  97.7 F (36.5 C) 97.6 F (36.4 C)  TempSrc:      SpO2: 96% 97% 98% 99%  Weight:      Height:       Physical Exam: General:NAD, sitting at the edge of the bed  Cardiac:RRR, no murmur appreciated  Pulmonary:Normal effort on RA Neuro:Awake, alert, participating in conversation  MSK: Left foot is wrapped in clean/dry dressing  Skin:warm and dry Psych:  normal mood and affect       Latest Ref Rng & Units 04/05/2024    8:37 PM 02/06/2023    2:46 PM 11/27/2022    5:37 AM  CBC  WBC 4.0 - 10.5 K/uL 11.4  9.7  7.0   Hemoglobin 13.0 - 17.0 g/dL 16.1  09.6  9.9   Hematocrit 39.0 - 52.0 % 31.3  40.2  31.3   Platelets 150 - 400 K/uL 250  205  275         Latest Ref Rng & Units 04/05/2024    8:37 PM 01/02/2024    1:33 PM 12/10/2023    3:10 PM  BMP  Glucose 70 - 99 mg/dL 045  409  811   BUN 6 - 20 mg/dL 37  18  27   Creatinine 0.61 - 1.24 mg/dL 9.14  7.82  9.56   BUN/Creat Ratio 9 - 20  9  12    Sodium 135 - 145 mmol/L 130  134  132   Potassium 3.5 - 5.1 mmol/L 4.1  5.0  5.4   Chloride 98 - 111 mmol/L 96  100  97   CO2 22 - 32 mmol/L 22  18  19    Calcium  8.9 - 10.3 mg/dL 9.4  9.5  21.3      Assessment/Plan:  Principal Problem:   Diabetic osteomyelitis (HCC) Active Problems:   Diabetes (HCC)    Seborrheic dermatitis   History of transmetatarsal amputation of foot (HCC)   Rash and nonspecific skin eruption   CKD (chronic kidney disease) stage 3, GFR 30-59 ml/min (HCC)   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 He received postoperative vancomycin  after cultures were collected. Fortunately, he feels well and denies pain at this time.  Plan: -Appreciate podiatry recommendations  -Follow up on surgical cultures  -Start 7 days of antibiotics: Augmentin  875-125mg  BID and Linezolid 600 mg BID ordered -Continue non-weight bearing  -Pain regimen: Acetaminophen  650mg  q6 prn and oxycodone  5mg  q6 hours prn for severe pain   -PT/OT  T2DM On a home regimen of Metformin  2,000 mg daily (not taking Jardiance  10mg  daily).  Plan: -Daily glucose checks with AM CBGs -Hold off on SSIs for now   CKD stage 3  Patient previously had a Renal US  and U/A in October 2024; both were normal at that time.  Plan: -Will continue to monitor renal function inpatient and recommend starting either SGLTi or GLP-1.  -May consider outpatient nephrology referral    Depression: Home citalopram  20mg  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 Resume home acyclovir  400 mg BID   Resolved Problems:  __________________________________  Code Status: Full  VTE Prophylaxis: Diet: Carb modified  IVF:N/A Barriers to Discharge: Medical Treatment  Dispo: Anticipated discharge in approximately 2-3 day(s).   Aurora Lees, DO 04/06/2024, 1:31 PM After 5pm on weekdays and 1pm on weekends: On Call pager [phone]

## 2024-04-06 NOTE — Progress Notes (Signed)
 Pt to OR at this time. CHG bath done by Carloyn Chi NT.

## 2024-04-07 ENCOUNTER — Encounter (HOSPITAL_COMMUNITY): Payer: Self-pay | Admitting: Podiatry

## 2024-04-07 DIAGNOSIS — E1169 Type 2 diabetes mellitus with other specified complication: Secondary | ICD-10-CM | POA: Diagnosis not present

## 2024-04-07 DIAGNOSIS — M869 Osteomyelitis, unspecified: Secondary | ICD-10-CM | POA: Diagnosis not present

## 2024-04-07 LAB — CBC
HCT: 30.7 % — ABNORMAL LOW (ref 39.0–52.0)
Hemoglobin: 10.9 g/dL — ABNORMAL LOW (ref 13.0–17.0)
MCH: 28.6 pg (ref 26.0–34.0)
MCHC: 35.5 g/dL (ref 30.0–36.0)
MCV: 80.6 fL (ref 80.0–100.0)
Platelets: 234 10*3/uL (ref 150–400)
RBC: 3.81 MIL/uL — ABNORMAL LOW (ref 4.22–5.81)
RDW: 13.2 % (ref 11.5–15.5)
WBC: 13.6 10*3/uL — ABNORMAL HIGH (ref 4.0–10.5)
nRBC: 0 % (ref 0.0–0.2)

## 2024-04-07 LAB — BASIC METABOLIC PANEL WITH GFR
Anion gap: 6 (ref 5–15)
BUN: 28 mg/dL — ABNORMAL HIGH (ref 6–20)
CO2: 24 mmol/L (ref 22–32)
Calcium: 9.2 mg/dL (ref 8.9–10.3)
Chloride: 101 mmol/L (ref 98–111)
Creatinine, Ser: 2.05 mg/dL — ABNORMAL HIGH (ref 0.61–1.24)
GFR, Estimated: 42 mL/min — ABNORMAL LOW (ref >60)
Glucose, Bld: 240 mg/dL — ABNORMAL HIGH (ref 70–99)
Potassium: 4.7 mmol/L (ref 3.5–5.1)
Sodium: 131 mmol/L — ABNORMAL LOW (ref 135–145)

## 2024-04-07 LAB — MAGNESIUM: Magnesium: 1.8 mg/dL (ref 1.7–2.4)

## 2024-04-07 LAB — GLUCOSE, CAPILLARY
Glucose-Capillary: 231 mg/dL — ABNORMAL HIGH (ref 70–99)
Glucose-Capillary: 197 mg/dL — ABNORMAL HIGH (ref 70–99)
Glucose-Capillary: 305 mg/dL — ABNORMAL HIGH (ref 70–99)
Glucose-Capillary: 193 mg/dL — ABNORMAL HIGH (ref 70–99)

## 2024-04-07 MED ORDER — MAGNESIUM SULFATE 2 GM/50ML IV SOLN
2.0000 g | Freq: Once | INTRAVENOUS | Status: AC
  Administered 2024-04-07: 2 g via INTRAVENOUS
  Filled 2024-04-07: qty 50

## 2024-04-07 MED ORDER — INSULIN ASPART 100 UNIT/ML IJ SOLN
0.0000 [IU] | Freq: Three times a day (TID) | INTRAMUSCULAR | Status: DC
  Administered 2024-04-07: 5 [IU] via SUBCUTANEOUS
  Administered 2024-04-07: 3 [IU] via SUBCUTANEOUS
  Administered 2024-04-07: 11 [IU] via SUBCUTANEOUS
  Administered 2024-04-08 (×2): 3 [IU] via SUBCUTANEOUS

## 2024-04-07 MED ORDER — INSULIN ASPART 100 UNIT/ML IJ SOLN: 0.0000 [IU] | Freq: Every day | INTRAMUSCULAR | Status: DC

## 2024-04-07 NOTE — Progress Notes (Signed)
 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   Today, he stated feeling well and denies foot pain. We discussed future disposition with him. He is still trying to set up staying at his friends house but he has a house as well. He does express concerns about steps at home that are present between floors.   Objective:  Vital signs in last 24 hours: Vitals:   04/06/24 2042 04/07/24 0028 04/07/24 0424 04/07/24 0758  BP: 122/75 138/79 116/73 120/87  Pulse: 77 95 93 (!) 102  Resp: 17 17 18    Temp: 98.6 F (37 C) 99.3 F (37.4 C) 99.8 F (37.7 C) 98 F (36.7 C)  TempSrc: Oral Oral Oral   SpO2: 98% 97% 98% 97%  Weight:      Height:       Physical Exam: General: NAD, sitting in bed watching videos on his phone  Cardiac:RRR, no murmur appreciated Pulmonary:Normal effort on room air Abdominal:soft, non-tender Neuro:awake, alert, participating in conversation  MSK: Left foot is in dry/clean bandage with an orthopedic shoe in place Skin:Warm and dry Psych: Normal mood and affect       Latest Ref Rng & Units 04/07/2024    5:57 AM 04/05/2024    8:37 PM 02/06/2023    2:46 PM  CBC  WBC 4.0 - 10.5 K/uL 13.6  11.4  9.7   Hemoglobin 13.0 - 17.0 g/dL 14.7  82.9  56.2   Hematocrit 39.0 - 52.0 % 30.7  31.3  40.2   Platelets 150 - 400 K/uL 234  250  205         Latest Ref Rng & Units 04/07/2024    5:57 AM 04/05/2024    8:37 PM 01/02/2024    1:33 PM  BMP  Glucose 70 - 99 mg/dL 130  865  784   BUN 6 - 20 mg/dL 28  37  18   Creatinine 0.61 - 1.24 mg/dL 6.96  2.95  2.84   BUN/Creat Ratio 9 - 20   9   Sodium 135 - 145 mmol/L 131  130  134   Potassium 3.5 - 5.1 mmol/L 4.7  4.1  5.0   Chloride 98 - 111 mmol/L  101  96  100   CO2 22 - 32 mmol/L 24  22  18    Calcium  8.9 - 10.3 mg/dL 9.2  9.4  9.5      Assessment/Plan:  Principal Problem:   Diabetic osteomyelitis (HCC) Active Problems:   Diabetes (HCC)   Seborrheic dermatitis   History of transmetatarsal amputation of foot (HCC)   Rash and nonspecific skin eruption   CKD (chronic kidney disease) stage 3, GFR 30-59 ml/min (HCC)   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 Fortunately, he feels well and denies pain at this time.  Plan: -Appreciate podiatry recommendations  -Follow up on surgical cultures  -Continue Augmentin  875-125mg  BID and Linezolid 600 mg BID for a total of 7 days, day 2/7  -Continue non-weight bearing  -Pain regimen: Acetaminophen  650mg  q6 prn and  oxycodone  5mg  q6 hours prn for severe pain   -PT/OT -Plan for discharge tomorrow   T2DM On a home regimen of Metformin  2,000 mg daily (not taking Jardiance  10mg  daily). Plan: -Started SSI moderate with night time correction due to elevated glucose on BMP and CBG  CKD stage 3  Patient previously had a Renal US  and U/A in October 2024; both were normal at that time.  Plan: -Will continue to monitor renal function inpatient and recommend starting either SGLTi or GLP-1.  -May consider outpatient nephrology referral    Depression: Hold home citalopram  20mg  while taking Linezolid due to risk of Serotonin syndrome  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 Resume home acyclovir  400 mg BID   Resolved Problems:  __________________________________  Code Status: Full  VTE Prophylaxis: Lovenox  60mg   Diet: Carb modified  IVF:N/A Barriers to Discharge: PT evaluation, Disposition planned for 06/04 Dispo: Anticipated discharge in approximately 1  Aurora Lees, DO 04/07/2024,  10:40 AM After 5pm on weekdays and 1pm on weekends: On Call pager [phone]

## 2024-04-07 NOTE — Anesthesia Postprocedure Evaluation (Signed)
 Anesthesia Post Note  Patient: Carl Conley  Procedure(s) Performed: EXCISION, METATARSAL BONE, HEAD (Left: Toe)     Patient location during evaluation: PACU Anesthesia Type: MAC Level of consciousness: awake and alert Pain management: pain level controlled Vital Signs Assessment: post-procedure vital signs reviewed and stable Respiratory status: spontaneous breathing, nonlabored ventilation, respiratory function stable and patient connected to nasal cannula oxygen Cardiovascular status: stable and blood pressure returned to baseline Postop Assessment: no apparent nausea or vomiting Anesthetic complications: no   No notable events documented.  Last Vitals:  Vitals:   04/07/24 0758 04/07/24 1715  BP: 120/87 125/70  Pulse: (!) 102 100  Resp:    Temp: 36.7 C 36.7 C  SpO2: 97% 97%    Last Pain:  Vitals:   04/07/24 0900  TempSrc:   PainSc: 0-No pain                 Lethaniel Rave

## 2024-04-07 NOTE — Plan of Care (Signed)

## 2024-04-07 NOTE — Progress Notes (Signed)
 Orthopedic Tech Progress Note Patient Details:  JAYLIN BENZEL 02/12/1986 [id number]  Ortho Devices Type of Ortho Device: Crutches Ortho Device/Splint Location: LLE Ortho Device/Splint Interventions: Ordered, Application, Adjustment   Post Interventions Patient Tolerated: Well, Ambulated well Instructions Provided: Care of device, Poper ambulation with device  [name] 04/07/2024, 4:28 PM

## 2024-04-07 NOTE — Inpatient Diabetes Management (Signed)
 Inpatient Diabetes Program Recommendations  AACE/ADA: New Consensus Statement on Inpatient Glycemic Control (2015)  Target Ranges:  Prepandial:   less than 140 mg/dL      Peak postprandial:   less than 180 mg/dL (1-2 hours)      Critically ill patients:  140 - 180 mg/dL   Lab Results  Component Value Date   GLUCAP 197 (H) 04/07/2024   HGBA1C 7.2 (A) 03/06/2024    Review of Glycemic Control  Latest Reference Range & Units 04/06/24 08:39 04/06/24 11:14 04/07/24 08:42 04/07/24 12:17  Glucose-Capillary 70 - 99 mg/dL 191 (H) 478 (H) 295 (H) 197 (H)   Diabetes history: DM 2 Outpatient Diabetes medications:  Jardiance  10 mg daily-not taking Metformin  500 mg bid Current orders for Inpatient glycemic control:  Novolog  0-15 units tid with meals and HS  Inpatient Diabetes Program Recommendations:    While in the hospital, consider adding Semglee  10 units daily (while oral DM medications are on hold).  Thanks,  Josefa Ni, RN, BC-ADM Inpatient Diabetes Coordinator Pager [phone]  (8a-5p)

## 2024-04-07 NOTE — Progress Notes (Signed)
 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 Pt underwent L TMA revision on 04/06/24 and will be NWB for 2-3 weeks.    PT Comments  Pt demo mod I bed mobility. CGA transfers and amb 25' x 2 with RW. Seated rest break between gait trials. Advised pt to bump up 2 steps into house on bottom vs backing up to chair positioned at top/on porch. Pt verbalizes understanding. Pt in agreement to stay on main level of house while he is NWB LLE. Pt verbalizing concerns regarding being home alone and possibly having a fall. Recommending w/c for home for increased mobility and decreased fall risk, while maintain NWB LLE.     If plan is discharge home, recommend the following: A little help with walking and/or transfers;A little help with bathing/dressing/bathroom;Assistance with cooking/housework;Assist for transportation;Help with stairs or ramp for entrance   Can travel by private vehicle        Equipment Recommendations  Wheelchair (measurements PT)    Recommendations for Other Services       Precautions / Restrictions Precautions Precautions: Fall Recall of Precautions/Restrictions: Intact Restrictions LLE Weight Bearing Per Provider Order: Non weight bearing Other Position/Activity Restrictions: Surgeon recommended NWB status for 2-3 weeks     Mobility  Bed Mobility Overal bed mobility: Modified Independent                  Transfers Overall transfer level: Needs assistance Equipment used: Rolling walker (2 wheels) Transfers: Sit to/from Stand Sit to Stand: Contact guard assist           General transfer comment: Pt tends to pull up on RW.    Ambulation/Gait Ambulation/Gait assistance: Contact guard assist Gait  Distance (Feet): 25 Feet (x 2) Assistive device: Rolling walker (2 wheels) Gait Pattern/deviations: Step-to pattern       General Gait Details: Pt demo good ability to maintain NWB LLE during short gait trials. Fatigues quickly.   Stairs Stairs:  (Discussed bumping up front steps on bottom or backing up to chair on porch. Pt in agreement to stay on main level of house while NWB LLE.)           Wheelchair Mobility     Tilt Bed    Modified Rankin (Stroke Patients Only)       Balance Overall balance assessment: Needs assistance, Mild deficits observed, not formally tested Sitting-balance support: Feet supported, No upper extremity supported Sitting balance-Leahy Scale: Good     Standing balance support: Bilateral upper extremity supported, During functional activity, Reliant on assistive device for balance Standing balance-Leahy Scale: Poor Standing balance comment: external support needed due to LLE NWB                            Communication Communication Communication: No apparent difficulties  Cognition Arousal: Alert Behavior During Therapy: WFL for tasks assessed/performed   PT - Cognitive impairments: No apparent impairments                         Following commands: Intact      Cueing Cueing Techniques: Verbal cues  Exercises      General Comments  Pertinent Vitals/Pain Pain Assessment Pain Assessment: No/denies pain    Home Living                          Prior Function            PT Goals (current goals can now be found in the care plan section) Acute Rehab PT Goals Patient Stated Goal: safe at home, staying on the 1st floor while NWB Progress towards PT goals: Progressing toward goals    Frequency    Min 4X/week      PT Plan      Co-evaluation              AM-PAC PT "6 Clicks" Mobility   Outcome Measure  Help needed turning from your back to your side while in a flat bed  without using bedrails?: None Help needed moving from lying on your back to sitting on the side of a flat bed without using bedrails?: None Help needed moving to and from a bed to a chair (including a wheelchair)?: A Little Help needed standing up from a chair using your arms (e.g., wheelchair or bedside chair)?: A Little Help needed to walk in hospital room?: A Little Help needed climbing 3-5 steps with a railing? : A Lot 6 Click Score: 19    End of Session Equipment Utilized During Treatment: Gait belt Activity Tolerance: Patient tolerated treatment well Patient left: in bed;with call bell/phone within reach Nurse Communication: Mobility status PT Visit Diagnosis: Other abnormalities of gait and mobility (R26.89);Difficulty in walking, not elsewhere classified (R26.2)     Time: 1478-2956 PT Time Calculation (min) (ACUTE ONLY): 26 min  Charges:    $Gait Training: 23-37 mins PT General Charges $$ ACUTE PT VISIT: 1 Visit                     Dorothye Gathers., PT  Office # [phone]    Guadelupe Leech 04/07/2024, 12:09 PM

## 2024-04-07 NOTE — Plan of Care (Signed)

## 2024-04-07 NOTE — Progress Notes (Signed)
  Subjective:  Patient ID: Carl Conley, male    DOB: [birth year],  MRN: [id number]  Chief Complaint  Patient presents with   lt foot surgery    DOS: 04/06/24 Procedure: 1.  Revision of transmetatarsal to Lisfranc amputation, left foot 2.  Excision of ulceration and complex wound closure 4 x 2 cm, left foot  38 y.o. male seen for post op check. He reports he is doing well denies pain since the surgery. Discussed findings from surgery and plan moving forward. Discussed need for short course of non weightbearing to help healing of the surgical site. He is relieved to hear I do not feel extended IV abx are needed just needs short course PO abx.   Review of Systems: Negative except as noted in the HPI. Denies N/V/F/Ch.   Objective:   Constitutional Well developed. Well nourished.  Vascular Foot warm and well perfused. Capillary refill normal to all digits.   No calf pain with palpation  Neurologic Normal speech. Oriented to person, place, and time. Epicritic sensation Absent to amp site level   Dermatologic Dressing C/D/I left foot  Orthopedic: S/p lisfranc amputation L foot   Radiographs: Interval resection of the remaining metatarsal bones to TMTJ level, no erosive changes seen  Pathology: Pending  Micro: Pendign  Assessment:   Chronic osteomyelitis of metatarsal remnants L foot TMA site s/p revision of TMA to lisfranc amputation with excision of ulceration and closure   Plan:  Patient was evaluated and treated and all questions answered.  POD # 1 s/p Lisfranc amputation, ulceration excision and closure -Progressing as expected post op no pain  - Will follow up tomorrow for dressing change then likely stable for DC home tomorrow following that -XR: expected post op changes -WB Status" NWB in post op shoe -Sutures: Remain intact 2-3 weeks. -Medications/ABX: Recommend short course PO abx - plan for Augmentin  / linezolid x 7 days -Dressing: Remain C/D/I will change  tomorrow - F/u Plan: He will follow up next week Tuesday in office. Dressing change here tomorrow        Maridee Shoemaker, DPM [facility] / [facility]

## 2024-04-08 ENCOUNTER — Other Ambulatory Visit (HOSPITAL_COMMUNITY): Payer: Self-pay

## 2024-04-08 ENCOUNTER — Other Ambulatory Visit: Payer: Self-pay

## 2024-04-08 DIAGNOSIS — T148XXA Other injury of unspecified body region, initial encounter: Secondary | ICD-10-CM

## 2024-04-08 DIAGNOSIS — E1169 Type 2 diabetes mellitus with other specified complication: Secondary | ICD-10-CM | POA: Diagnosis not present

## 2024-04-08 DIAGNOSIS — E1159 Type 2 diabetes mellitus with other circulatory complications: Secondary | ICD-10-CM | POA: Diagnosis not present

## 2024-04-08 DIAGNOSIS — M869 Osteomyelitis, unspecified: Secondary | ICD-10-CM | POA: Diagnosis not present

## 2024-04-08 LAB — BASIC METABOLIC PANEL WITH GFR
Anion gap: 9 (ref 5–15)
BUN: 31 mg/dL — ABNORMAL HIGH (ref 6–20)
CO2: 21 mmol/L — ABNORMAL LOW (ref 22–32)
Calcium: 8.9 mg/dL (ref 8.9–10.3)
Chloride: 99 mmol/L (ref 98–111)
Creatinine, Ser: 2.32 mg/dL — ABNORMAL HIGH (ref 0.61–1.24)
GFR, Estimated: 36 mL/min — ABNORMAL LOW (ref >60)
Glucose, Bld: 241 mg/dL — ABNORMAL HIGH (ref 70–99)
Potassium: 4.3 mmol/L (ref 3.5–5.1)
Sodium: 129 mmol/L — ABNORMAL LOW (ref 135–145)

## 2024-04-08 LAB — CBC
HCT: 28.1 % — ABNORMAL LOW (ref 39.0–52.0)
Hemoglobin: 10.1 g/dL — ABNORMAL LOW (ref 13.0–17.0)
MCH: 28.4 pg (ref 26.0–34.0)
MCHC: 35.9 g/dL (ref 30.0–36.0)
MCV: 78.9 fL — ABNORMAL LOW (ref 80.0–100.0)
Platelets: 205 10*3/uL (ref 150–400)
RBC: 3.56 MIL/uL — ABNORMAL LOW (ref 4.22–5.81)
RDW: 13.2 % (ref 11.5–15.5)
WBC: 10.9 10*3/uL — ABNORMAL HIGH (ref 4.0–10.5)
nRBC: 0 % (ref 0.0–0.2)

## 2024-04-08 LAB — GLUCOSE, CAPILLARY
Glucose-Capillary: 200 mg/dL — ABNORMAL HIGH (ref 70–99)
Glucose-Capillary: 168 mg/dL — ABNORMAL HIGH (ref 70–99)

## 2024-04-08 LAB — MAGNESIUM: Magnesium: 2 mg/dL (ref 1.7–2.4)

## 2024-04-08 MED ORDER — LINEZOLID 600 MG PO TABS
600.0000 mg | ORAL_TABLET | Freq: Two times a day (BID) | ORAL | 0 refills | Status: DC
  Filled 2024-04-08: qty 9, 5d supply, fill #0

## 2024-04-08 MED ORDER — ROSUVASTATIN CALCIUM 20 MG PO TABS
20.0000 mg | ORAL_TABLET | Freq: Every day | ORAL | 1 refills | Status: AC
  Filled 2024-04-08 (×2): qty 90, 90d supply, fill #0

## 2024-04-08 MED ORDER — INSULIN GLARGINE 100 UNIT/ML SOLOSTAR PEN
10.0000 [IU] | PEN_INJECTOR | Freq: Every day | SUBCUTANEOUS | 0 refills | Status: DC
  Filled 2024-04-08: qty 3, 30d supply, fill #0

## 2024-04-08 MED ORDER — BLOOD GLUCOSE MONITOR SYSTEM W/DEVICE KIT
1.0000 | PACK | Freq: Three times a day (TID) | 0 refills | Status: DC
Start: 2024-04-08 — End: 2024-04-08
  Filled 2024-04-08: qty 1, 30d supply, fill #0

## 2024-04-08 MED ORDER — LANCET DEVICE MISC
1.0000 | Freq: Three times a day (TID) | 0 refills | Status: AC
Start: 2024-04-08 — End: 2024-05-08
  Filled 2024-04-08: qty 1, 30d supply, fill #0

## 2024-04-08 MED ORDER — PEN NEEDLES 32G X 4 MM MISC
30.0000 | Freq: Every day | 0 refills | Status: AC
  Filled 2024-04-08: qty 100, 90d supply, fill #0

## 2024-04-08 MED ORDER — ACCU-CHEK SOFTCLIX LANCETS MISC
1.0000 | Freq: Three times a day (TID) | 0 refills | Status: DC
Start: 2024-04-08 — End: 2024-04-08
  Filled 2024-04-08: qty 100, 30d supply, fill #0
  Filled 2024-04-08: qty 100, 33d supply, fill #0

## 2024-04-08 MED ORDER — LINEZOLID 600 MG PO TABS
600.0000 mg | ORAL_TABLET | Freq: Two times a day (BID) | ORAL | 0 refills | Status: DC
  Filled 2024-04-08 (×2): qty 9, 5d supply, fill #0

## 2024-04-08 MED ORDER — ACETAMINOPHEN 325 MG PO TABS
650.0000 mg | ORAL_TABLET | Freq: Four times a day (QID) | ORAL | 0 refills | Status: AC | PRN
  Filled 2024-04-08: qty 30, 4d supply, fill #0

## 2024-04-08 MED ORDER — BLOOD GLUCOSE MONITOR SYSTEM W/DEVICE KIT
1.0000 | PACK | Freq: Three times a day (TID) | 0 refills | Status: AC
  Filled 2024-04-08: qty 1, 30d supply, fill #0

## 2024-04-08 MED ORDER — BLOOD GLUCOSE TEST VI STRP
1.0000 | ORAL_STRIP | Freq: Three times a day (TID) | 0 refills | Status: AC
Start: 2024-04-08 — End: 2024-05-12
  Filled 2024-04-08 (×2): qty 100, 34d supply, fill #0

## 2024-04-08 MED ORDER — LANCET DEVICE MISC
1.0000 | Freq: Three times a day (TID) | 0 refills | Status: DC
  Filled 2024-04-08: qty 1, 30d supply, fill #0

## 2024-04-08 MED ORDER — ACYCLOVIR 400 MG PO TABS
400.0000 mg | ORAL_TABLET | Freq: Two times a day (BID) | ORAL | 0 refills | Status: AC
  Filled 2024-04-08: qty 60, 30d supply, fill #0

## 2024-04-08 MED ORDER — INSULIN GLARGINE 100 UNIT/ML SOLOSTAR PEN
10.0000 [IU] | PEN_INJECTOR | Freq: Every day | SUBCUTANEOUS | 0 refills | Status: DC
  Filled 2024-04-08: qty 3, 28d supply, fill #0

## 2024-04-08 MED ORDER — AMOXICILLIN-POT CLAVULANATE 875-125 MG PO TABS
1.0000 | ORAL_TABLET | Freq: Two times a day (BID) | ORAL | 0 refills | Status: AC
  Filled 2024-04-08: qty 9, 5d supply, fill #0

## 2024-04-08 MED ORDER — ACCU-CHEK SOFTCLIX LANCETS MISC
1.0000 | Freq: Three times a day (TID) | 0 refills | Status: AC
  Filled 2024-04-08: qty 100, 34d supply, fill #0

## 2024-04-08 MED ORDER — AMOXICILLIN-POT CLAVULANATE 875-125 MG PO TABS
1.0000 | ORAL_TABLET | Freq: Two times a day (BID) | ORAL | 0 refills | Status: DC
  Filled 2024-04-08: qty 9, 5d supply, fill #0

## 2024-04-08 MED ORDER — ACETAMINOPHEN 325 MG PO TABS
650.0000 mg | ORAL_TABLET | Freq: Four times a day (QID) | ORAL | 0 refills | Status: DC | PRN
  Filled 2024-04-08: qty 30, 4d supply, fill #0

## 2024-04-08 NOTE — Plan of Care (Signed)
  Problem: Education: Goal: Knowledge of General Education information will improve Description: Including pain rating scale, medication(s)/side effects and non-pharmacologic comfort measures Outcome: Adequate for Discharge   Problem: Health Behavior/Discharge Planning: Goal: Ability to manage health-related needs will improve Outcome: Adequate for Discharge   Problem: Clinical Measurements: Goal: Ability to maintain clinical measurements within normal limits will improve Outcome: Adequate for Discharge Goal: Will remain free from infection Outcome: Adequate for Discharge Goal: Diagnostic test results will improve Outcome: Adequate for Discharge Goal: Respiratory complications will improve Outcome: Adequate for Discharge Goal: Cardiovascular complication will be avoided Outcome: Adequate for Discharge   Problem: Activity: Goal: Risk for activity intolerance will decrease Outcome: Adequate for Discharge   Problem: Nutrition: Goal: Adequate nutrition will be maintained Outcome: Adequate for Discharge   Problem: Coping: Goal: Level of anxiety will decrease Outcome: Adequate for Discharge   Problem: Elimination: Goal: Will not experience complications related to bowel motility Outcome: Adequate for Discharge Goal: Will not experience complications related to urinary retention Outcome: Adequate for Discharge   Problem: Pain Managment: Goal: General experience of comfort will improve and/or be controlled Outcome: Adequate for Discharge   Problem: Safety: Goal: Ability to remain free from injury will improve Outcome: Adequate for Discharge   Problem: Skin Integrity: Goal: Risk for impaired skin integrity will decrease Outcome: Adequate for Discharge   Problem: Acute Rehab PT Goals(only PT should resolve) Goal: Pt Will Go Supine/Side To Sit Outcome: Adequate for Discharge Goal: Pt Will Go Sit To Supine/Side Outcome: Adequate for Discharge Goal: Patient Will Transfer  Sit To/From Stand Outcome: Adequate for Discharge Goal: Pt Will Ambulate Outcome: Adequate for Discharge Goal: Pt Will Go Up/Down Stairs Outcome: Adequate for Discharge   Problem: Education: Goal: Ability to describe self-care measures that may prevent or decrease complications (Diabetes Survival Skills Education) will improve Outcome: Adequate for Discharge Goal: Individualized Educational Video(s) Outcome: Adequate for Discharge   Problem: Coping: Goal: Ability to adjust to condition or change in health will improve Outcome: Adequate for Discharge   Problem: Fluid Volume: Goal: Ability to maintain a balanced intake and output will improve Outcome: Adequate for Discharge   Problem: Health Behavior/Discharge Planning: Goal: Ability to identify and utilize available resources and services will improve Outcome: Adequate for Discharge Goal: Ability to manage health-related needs will improve Outcome: Adequate for Discharge   Problem: Metabolic: Goal: Ability to maintain appropriate glucose levels will improve Outcome: Adequate for Discharge   Problem: Nutritional: Goal: Maintenance of adequate nutrition will improve Outcome: Adequate for Discharge Goal: Progress toward achieving an optimal weight will improve Outcome: Adequate for Discharge   Problem: Skin Integrity: Goal: Risk for impaired skin integrity will decrease Outcome: Adequate for Discharge   Problem: Tissue Perfusion: Goal: Adequacy of tissue perfusion will improve Outcome: Adequate for Discharge

## 2024-04-08 NOTE — Progress Notes (Signed)
 Physical Therapy Treatment Patient Details Name: Carl Conley MRN: [id number] DOB: 1986-09-03 Today's Date: 04/08/2024   History of Present Illness 38 yo who presented 6/1 with L foot wound. Underwent L TMA revision on 04/06/24 and will be NWB for 2-3 weeks. 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 The pt was unstable and had x1 LOB, needing minA to recover, when ambulating with crutches. He only needed CGA for safety and did not have any LOB when ambulating with the RW this date. He reports improved stability and safety when utilizing the RW to ambulate and declines desire for crutches at d/c. He does continue to fatigue fairly quickly, needing to sit and rest several minutes between gait bouts, and would benefit from a w/c for community mobility while he is to remain NWB on his L leg. He declined desire or need to practice stairs this date. Will continue to follow acutely.     If plan is discharge home, recommend the following: A little help with walking and/or transfers;A little help with bathing/dressing/bathroom;Assistance with cooking/housework;Assist for transportation;Help with stairs or ramp for entrance   Can travel by private vehicle        Equipment Recommendations  Wheelchair (measurements PT)    Recommendations for Other Services       Precautions / Restrictions Precautions Precautions: Fall Recall of Precautions/Restrictions: Intact Restrictions Weight Bearing Restrictions Per Provider Order: Yes LLE Weight Bearing Per Provider Order: Non weight bearing Other Position/Activity Restrictions: Surgeon recommended NWB status for 2-3 weeks     Mobility  Bed Mobility Overal bed mobility: Modified Independent             General bed mobility comments:  Able to exit/enter L EOB without assistance    Transfers Overall transfer level: Needs assistance Equipment used: Rolling walker (2 wheels), Crutches Transfers: Sit to/from Stand Sit to Stand: Contact guard assist           General transfer comment: Demonstrated to pt on placement of crutches for safety with transfers. Pt demonstrated understanding with extra time and intermittent cuing, needing CGA for safety transferring to stand from EOB 1x to crutches and 1x to RW. Pt tends to pull up on RW to stand up    Ambulation/Gait Ambulation/Gait assistance: Contact guard assist, Min assist Gait Distance (Feet): 120 Feet (x2 bouts of ~70 ft > ~120 ft) Assistive device: Rolling walker (2 wheels), Crutches Gait Pattern/deviations: Step-to pattern (swing-to) Gait velocity: reduced Gait velocity interpretation: <1.8 ft/sec, indicate of risk for recurrent falls   General Gait Details: Pt ambulated the first bout with crutches, needing cuing to push mostly through his hands and not his armpits, to keep crutches out from in front of his feet to avoid tripping, and to slow down and ensure he has his balance before lifting them again. Pt had x1 LOB when using crutches, needing minA to recover, otherwise CGA for safety throughout. Improved stability noted when utilizing the RW for support, still needing cues to slow down and keep RW on ground for improved stability and safety, but no LOB, CGA for safety   Stairs Stairs:  (verbally discussed stair negotiation options with bumping up/down on buttocks or sitting on chair on top step to avoid stairs; pt reported he was told he could place  weight on the foot only to navigate stairs and declined need or desire to practice)           Wheelchair Mobility     Tilt Bed    Modified Rankin (Stroke Patients Only)       Balance Overall balance assessment: Needs assistance Sitting-balance support: No upper extremity supported, Feet supported Sitting  balance-Leahy Scale: Good     Standing balance support: Bilateral upper extremity supported, During functional activity, Reliant on assistive device for balance Standing balance-Leahy Scale: Poor Standing balance comment: Reliant on AD, better balance with RW than crutches; x1 LOB with crutches needing minA to recover                            Communication Communication Communication: No apparent difficulties  Cognition Arousal: Alert Behavior During Therapy: WFL for tasks assessed/performed   PT - Cognitive impairments: Attention, Memory, Safety/Judgement                       PT - Cognition Comments: Question some memory deficits as he intermittently needed reminders for AD placement to maintain his safety. Noted some delayed processing. Following commands: Intact      Cueing Cueing Techniques: Verbal cues  Exercises      General Comments General comments (skin integrity, edema, etc.): encouraged pt to have his dad assist him into the house upon d/c      Pertinent Vitals/Pain Pain Assessment Pain Assessment: Faces Faces Pain Scale: Hurts a little bit Pain Location: L foot Pain Descriptors / Indicators: Discomfort, Operative site guarding Pain Intervention(s): Limited activity within patient's tolerance, Monitored during session, Repositioned    Home Living                          Prior Function            PT Goals (current goals can now be found in the care plan section) Acute Rehab PT Goals Patient Stated Goal: to be safe at home PT Goal Formulation: With patient Time For Goal Achievement: 04/13/24 Potential to Achieve Goals: Good Progress towards PT goals: Progressing toward goals    Frequency    Min 2X/week      PT Plan      Co-evaluation              AM-PAC PT "6 Clicks" Mobility   Outcome Measure  Help needed turning from your back to your side while in a flat bed without using bedrails?: None Help  needed moving from lying on your back to sitting on the side of a flat bed without using bedrails?: None Help needed moving to and from a bed to a chair (including a wheelchair)?: A Little Help needed standing up from a chair using your arms (e.g., wheelchair or bedside chair)?: A Little Help needed to walk in hospital room?: A Little Help needed climbing 3-5 steps with a railing? : A Lot 6 Click Score: 19    End of Session Equipment Utilized During Treatment: Gait belt Activity Tolerance: Patient tolerated treatment well Patient left: in bed;with call bell/phone within reach;with bed alarm set Nurse Communication: Other (comment) (notified MD of DME needs and pt's questions/concerns with d/c with questionable assistance from father to d/c this date) PT Visit Diagnosis: Other abnormalities of gait and mobility (R26.89);Difficulty in walking, not elsewhere classified (R26.2);Unsteadiness on feet (R26.81)     Time:  1610-9604 PT Time Calculation (min) (ACUTE ONLY): 24 min  Charges:    $Gait Training: 23-37 mins PT General Charges $$ ACUTE PT VISIT: 1 Visit                     Vernida Goodie, PT, DPT Acute Rehabilitation Services  Office: [phone]    Ellyn Hack 04/08/2024, 12:57 PM

## 2024-04-08 NOTE — Inpatient Diabetes Management (Signed)
 Inpatient Diabetes Program Recommendations  AACE/ADA: New Consensus Statement on Inpatient Glycemic Control (2015)  Target Ranges:  Prepandial:   less than 140 mg/dL      Peak postprandial:   less than 180 mg/dL (1-2 hours)      Critically ill patients:  140 - 180 mg/dL   Lab Results  Component Value Date   GLUCAP 200 (H) 04/08/2024   HGBA1C 7.2 (A) 03/06/2024    Review of Glycemic Control  Latest Reference Range & Units 04/06/24 08:39 04/06/24 11:14 04/07/24 08:42 04/07/24 12:17 04/07/24 17:14 04/07/24 21:30 04/08/24 08:19  Glucose-Capillary 70 - 99 mg/dL 086 (H) 578 (H) 469 (H) 197 (H) 305 (H) 193 (H) 200 (H)  (H): Data is abnormally high  Diabetes history: DM 2 Outpatient Diabetes medications:  Jardiance  10 mg daily-not taking Metformin  500 mg bid Current orders for Inpatient glycemic control:  Novolog  0-15 units tid with meals and HS   Inpatient Diabetes Program Recommendations:     While in the hospital, consider adding Semglee  10 units daily (while oral DM medications are on hold).    Thank you, Hays Lipschutz, MSN, CDCES Diabetes Coordinator Inpatient Diabetes Program [phone] (team pager from 8a-5p)

## 2024-04-08 NOTE — Discharge Instructions (Signed)
 You came to the hospital for a foot infection and you were diagnosed with osteomyelitis.  We treated you with surgery and antibiotics.   *For your Osteomyelitis  -We have started you on these following medications:  -Linezolid, please take one pill twice a day   -Augmentin , please take one pill twice a day   *For your Diabetes -We have started you on these following medications:  -Insulin  (Lantus ) 10 units daily, please inject 10 units daily until you see one of our clinic providers             -Be sure to check your blood sugar every morning and call the clinic if the glucose is less than 70 and do not take your insulin   -We have stopped the following medications:  -Metformin , hold off until you're instructed to resume   -Please stop taking citalopram  until Monday 06/09!  Continue to hydrate yourself!!! It is important to drink water daily.   Follow-up appointments: Please visit your family doctor on 06/ 08/2024 at 2:45 pm, please get lab work done on Friday at 9:00, to check your kidney function  Please follow up with your foot doctor!   If you have any questions or concerns please feel free to call: Internal medicine clinic at [phone]   If you have any of these following symptoms, please call us  or seek care at an emergency department: -Chest Pain -Difficulty Breathing -Syncope (passing out) -Drooping of face -Slurred speech -Sudden weakness in your leg or arm -Fever -Chills   We are glad that you are feeling better, it was a pleasure to care for you!  Aurora Lees DO

## 2024-04-08 NOTE — Progress Notes (Signed)
  Subjective:  Patient ID: Carl Conley, male    DOB: 03/22/1986,  MRN: [id number]  Chief Complaint  Patient presents with   lt foot surgery    DOS: 04/06/24 Procedure: 1.  Revision of transmetatarsal to Lisfranc amputation, left foot 2.  Excision of ulceration and complex wound closure 4 x 2 cm, left foot  38 y.o. male seen for post op check. Discussed amp site looking good. He still has no pain. Reports concern about being at home with nobody to help him. I reinforced need for non weightbearing.   Review of Systems: Negative except as noted in the HPI. Denies N/V/F/Ch.   Objective:   Constitutional Well developed. Well nourished.  Vascular Foot warm and well perfused. Capillary refill normal to all digits.   No calf pain with palpation  Neurologic Normal speech. Oriented to person, place, and time. Epicritic sensation Absent to amp site level   Dermatologic Amp site healthy and viable no erythema drainage dehiscence or necrosis. Expected edema.    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  POD # 2 s/p Lisfranc amputation, ulceration excision and closure -Progressing as expected post op no pain, Dressing changed and amp site looking viable with no necrosis or dehiscence at margins -XR: expected post op changes -WB Status: NWB in post op shoe, ok to do heel WB for transfers / into house, to bathroom etc  -Sutures: Remain intact 2-3 weeks. -Medications/ABX: Recommend short course PO abx - plan for Augmentin  / linezolid x 7 days -Dressing: Remain C/D/I  until follow up - F/u Plan: He will follow up  next week Tuesday in office, office to call to arrange. Stable for DC from my perspective, will sign off.         Maridee Shoemaker, DPM [facility] / [facility]

## 2024-04-08 NOTE — Discharge Summary (Signed)
 Name: Carl Conley MRN: [id number] DOB: [birth year] 38 y.o. PCP: Nooruddin, Saad, MD  Date of Admission: 04/05/2024  8:01 PM Date of Discharge: 04/08/2024 3:52 PM Attending Physician: Dr. 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       STOP taking these medications    empagliflozin  10 MG Tabs tablet Commonly known as: Jardiance    metFORMIN  500 MG 24 hr tablet Commonly known as: GLUCOPHAGE -XR   mupirocin  ointment 2 % Commonly known as: BACTROBAN        TAKE these medications    Accu-Chek Softclix Lancets lancets Use to check blood sugar in the morning, at noon, and at bedtime.   acetaminophen  325 MG tablet Commonly known as: TYLENOL  Take 2 tablets (650 mg total) by mouth every 6 (six) hours as needed for moderate pain (pain score 4-6).   acyclovir  400 MG tablet Commonly known as: ZOVIRAX  Take 1 tablet (400 mg total) by mouth 2 (two) times daily.   amoxicillin -clavulanate 875-125 MG tablet Commonly known as: AUGMENTIN  Take 1 tablet by mouth every 12 (twelve) hours for 9 doses.   Blood Glucose Monitor System w/Device Kit Use to check blood sugar in the morning, at noon, and at bedtime.   BLOOD GLUCOSE TEST STRIPS Strp Use to check sugars in the morning, at noon, and at bedtime.   insulin  glargine 100 UNIT/ML Solostar Pen Commonly  known as: LANTUS  Inject 10 Units into the skin daily.   ketoconazole  2 % shampoo Commonly known as: NIZORAL  APPLY 1 APPLICATION TOPICALLY 2 (TWO) TIMES A WEEK.   Lancet Device Misc Use as directed to check blood sugars in the morning, noon and bedtime.   linezolid 600 MG tablet Commonly known as: ZYVOX Take 1 tablet (600 mg total) by mouth every 12 (twelve) hours.   rosuvastatin  20 MG tablet Commonly known as: CRESTOR  Take 1 tablet (20 mg total) by mouth daily.               Durable Medical Equipment  (From admission, onward)           Start     Ordered   04/07/24 0928  For home use only DME Crutches  Once        04/07/24 4098            Disposition and follow-up:   Carl Conley was discharged from [facility] in Stable condition.  At the hospital follow up visit please address:  1.  Follow-up:  *Left Diabetic osteomyelitis s/p revision of Left TMA  -Ensure patient finishes Augmentin  and Linezolid course  -Ensure patient follows up with podiatry  -Follow up cultures from surgery   *T2DM -Metformin  was held due to decrease renal function -Patient was started on insulin  and was provided return  precautions while using insulin  -Obtain BMP and if renal function has improved, consider d/c insulin  and restarting Metformin  -Note that I discussed GLP-1 with patient who seemed agreeable but will need a follow up discussion prior to starting this medication   *CKD stage 3  -Patient had elevated serum creatinine thought to be due to poor oral intake -Repeat BMP  *Depression -Note that citalopram  was held while using linezolid, he was instructed to start citalopram  after finishing course of antibiotics    2.  Labs / imaging needed at time of follow-up: BMP  3.  Pending labs/ test needing follow-up: Surgery cultures   4.  Medication Changes  STOPPED  -Citalopram  until he finishes antibiotic course    ADDED  -Linezolid   -Augmentin     -Insulin  (Lantus ) 10 units daily    MODIFIED  -N/A  Hospital Course by problem list: Diabetic osteomyelitis Presented with severe neuropathy in both feet and ongoing drainage from both feet.  MRI in April was suggestive of osteomyelitis at the margin of the transected second metatarsal shaft of the left foot.  MRI of right foot showed some changes that were suggestive of osteomyelitis as well but were not as unequivocal.  Patient underwent a left TMA revision on 06/02 with expected clean margins. He received postoperative vancomycin  after cultures were collected which show NGTD on the prelimary report. He was started on a 7 day course of linezolid and Augmentin .   Diabetes On a home regimen of Metformin  2,000 mg daily (not taking Jardiance  10mg  daily). Due to elevated glucose, SSI was started inpatient. With his worsening renal function, metformin  was held at discharge and he was sent home with Lantus  10 units daily for glucose control. We spoke in great detail of the benefits of GLP-1 (ozempic) and he will need a follow up conversation prior to initiating medication.   Elevated serum Creatinine  CKD stage 3 Patient has an elevated serum creatinine of 2.32 from his baseline of ~2. Suspect that this is due to decreased oral intake due to avoid hospital tap water because of taste. Patient previously had a Renal US  and U/A in October 2024; both were normal at that time. Strongly recommend GLP-1 at discharge for both T2DM and renal disease.   Depression: Home citalopram  20mg  was held due to risk of serotonin syndrome while using linezolid.   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 Resume home acyclovir  400 mg BID       Discharge Subjective: Patient is doing well and denies left foot pain at this time. We spoke about the benefits of beginning a GLP-1 agonist and patient appeared interested but will need a follow up conversation. Patient was initially hesitant to go  home, but felt more reassured after PT session and speaking with his dad who will transport him and help him get set up at home.   Discharge Exam:   Blood pressure 106/77, pulse 73, temperature 98 F (36.7 C), resp. rate 16, height 5\' 11"  (1.803 m), weight 118.6 kg, SpO2 98%.  Constitutional:Well-appearing, sitting in bed, in no acute distress Cardiovascular: regular rate and rhythm Pulmonary/Chest: normal work of breathing on room air Neurological: alert & awake, participating in conversation  MSK: Left foot is wrapped in clean, dry bandage and has an orthopedic shoe placed  Skin: warm and dry Psych: Normal mood and affect  Pertinent Labs, Studies, and Procedures:     Latest Ref Rng & Units 04/08/2024    5:18 AM 04/07/2024  5:57 AM 04/05/2024    8:37 PM  CBC  WBC 4.0 - 10.5 K/uL 10.9  13.6  11.4   Hemoglobin 13.0 - 17.0 g/dL 56.2  13.0  86.5   Hematocrit 39.0 - 52.0 % 28.1  30.7  31.3   Platelets 150 - 400 K/uL 205  234  250        Latest Ref Rng & Units 04/08/2024    5:18 AM 04/07/2024    5:57 AM 04/05/2024    8:37 PM  CMP  Glucose 70 - 99 mg/dL 784  696  295   BUN 6 - 20 mg/dL 31  28  37   Creatinine 0.61 - 1.24 mg/dL 2.84  1.32  4.40   Sodium 135 - 145 mmol/L 129  131  130   Potassium 3.5 - 5.1 mmol/L 4.3  4.7  4.1   Chloride 98 - 111 mmol/L 99  101  96   CO2 22 - 32 mmol/L 21  24  22    Calcium  8.9 - 10.3 mg/dL 8.9  9.2  9.4     DG Foot 2 Views Left Result Date: 04/06/2024 CLINICAL DATA:  Postoperative state. EXAM: LEFT FOOT - 2 VIEW COMPARISON:  01/09/2024 FINDINGS: Interval resection of the metatarsals. Postsurgical change in the operative bed soft tissues with edema and skin staples in place. No erosive change of the included tarsal bones IMPRESSION: Interval resection of the metatarsals. Electronically Signed   By: Chadwick Colonel M.D.   On: 04/06/2024 16:29     Discharge Instructions: Discharge Instructions     Call MD for:  difficulty breathing, headache or visual  disturbances   Complete by: As directed    Call MD for:  extreme fatigue   Complete by: As directed    Call MD for:  hives   Complete by: As directed    Call MD for:  persistant dizziness or light-headedness   Complete by: As directed    Call MD for:  persistant nausea and vomiting   Complete by: As directed    Call MD for:  redness, tenderness, or signs of infection (pain, swelling, redness, odor or green/yellow discharge around incision site)   Complete by: As directed    Call MD for:  severe uncontrolled pain   Complete by: As directed    Call MD for:  temperature >100.4   Complete by: As directed    Diet - low sodium heart healthy   Complete by: As directed    Discharge instructions   Complete by: As directed    You came to the hospital for a foot infection and you were diagnosed with osteomyelitis.  We treated you with surgery and antibiotics.   *For your Osteomyelitis  -We have started you on these following medications:  -Linezolid, please take one pill twice a day   -Augmentin , please take one pill twice a day   *For your Diabetes -We have started you on these following medications:  -Insulin  (Lantus ) 10 units daily, please inject 10 units daily until you see one of our clinic providers             -Be sure to check your blood sugar every morning and call the clinic if the glucose is less than 70 and do not take your insulin   -We have stopped the following medications:  -Metformin , hold off until you're instructed to resume   -Please stop taking citalopram  until Monday 06/09!  Continue to hydrate yourself!!! It is important to  drink water daily.   Follow-up appointments: Please visit your family doctor on 06/ 08/2024 at 2:45 pm, please get lab work done on Friday at 9:00, to check your kidney function  Please follow up with your foot doctor!   If you have any questions or concerns please feel free to call: Internal medicine clinic at [phone]   If you have any  of these following symptoms, please call us  or seek care at an emergency department: -Chest Pain -Difficulty Breathing -Syncope (passing out) -Drooping of face -Slurred speech -Sudden weakness in your leg or arm -Fever -Chills   We are glad that you are feeling better, it was a pleasure to care for you!  Aurora Lees DO   Increase activity slowly   Complete by: As directed        Signed: Aurora Lees DO [facility] - PGY1 04/08/2024, 3:52 PM    Please contact the on call pager after 5 pm and on weekends at [phone].

## 2024-04-08 NOTE — TOC Transition Note (Signed)
 Transition of Care [facility]) - Discharge Note   Patient Details  Name: BARACK NICODEMUS MRN: [id number] Date of Birth: May 24, 1986  Transition of Care [facility]) CM/SW Contact:  Jeani Mill, RN Phone Number: 04/08/2024, 3:49 PM   Clinical Narrative:    Patient stable for discharge. Patient has a walker at home.  Dad will transport home.  No other TOC needs at this time.  Final next level of care: Home/Self Care Barriers to Discharge: Barriers Resolved   Patient Goals and CMS Choice Patient states their goals for this hospitalization and ongoing recovery are:: Return home          Discharge Placement                   Home    Discharge Plan and Services Additional resources added to the After Visit Summary for                                       Social Drivers of Health (SDOH) Interventions SDOH Screenings   Food Insecurity: Patient Declined (04/06/2024)  Housing: Patient Declined (04/06/2024)  Transportation Needs: Patient Declined (04/06/2024)  Utilities: Patient Declined (04/06/2024)  Depression (PHQ2-9): High Risk (03/06/2024)  Tobacco Use: Low Risk  (04/06/2024)     Readmission Risk Interventions     No data to display

## 2024-04-08 NOTE — Progress Notes (Signed)
 Explained discharge instructions to patient. Reviewed follow up appointment and next medication administration times. Also reviewed education. Patient verbalized having an understanding for instructions given. All belongings are in the patient's possession. Awaiting TOC meds to be delivered to discharge lounger. IV removed.  No other needs verbalized. Will transport to the  discharge lounge to await meds and transportation home.

## 2024-04-08 NOTE — Plan of Care (Signed)

## 2024-04-10 ENCOUNTER — Other Ambulatory Visit: Payer: Self-pay | Admitting: Student

## 2024-04-10 ENCOUNTER — Other Ambulatory Visit (INDEPENDENT_AMBULATORY_CARE_PROVIDER_SITE_OTHER)

## 2024-04-10 DIAGNOSIS — N179 Acute kidney failure, unspecified: Secondary | ICD-10-CM

## 2024-04-10 LAB — CULTURE, BLOOD (ROUTINE X 2)
Culture: NO GROWTH
Culture: NO GROWTH
Special Requests: ADEQUATE

## 2024-04-10 LAB — SURGICAL PATHOLOGY

## 2024-04-11 ENCOUNTER — Ambulatory Visit: Payer: Self-pay | Admitting: Student

## 2024-04-11 LAB — BMP8+ANION GAP
Anion Gap: 20 mmol/L — ABNORMAL HIGH (ref 10.0–18.0)
BUN/Creatinine Ratio: 15 (ref 9–20)
BUN: 35 mg/dL — ABNORMAL HIGH (ref 6–20)
CO2: 15 mmol/L — ABNORMAL LOW (ref 20–29)
Calcium: 9.4 mg/dL (ref 8.7–10.2)
Chloride: 101 mmol/L (ref 96–106)
Creatinine, Ser: 2.36 mg/dL — ABNORMAL HIGH (ref 0.76–1.27)
Glucose: 166 mg/dL — ABNORMAL HIGH (ref 70–99)
Potassium: 5.3 mmol/L — ABNORMAL HIGH (ref 3.5–5.2)
Sodium: 136 mmol/L (ref 134–144)
eGFR: 35 mL/min/{1.73_m2} — ABNORMAL LOW (ref >59)

## 2024-04-11 LAB — AEROBIC/ANAEROBIC CULTURE W GRAM STAIN (SURGICAL/DEEP WOUND): Gram Stain: NONE SEEN

## 2024-04-11 NOTE — Progress Notes (Signed)
 Called the patient to discuss repeat BMP after recent discharge.  Serum creatinine is stable and elevated from baseline but potassium has risen from 4.3-5.3.  He is not on any medications and had significant potassium raising effects.  Only other possibility is worsening kidney function although his creatinine has been stable and he is not having any change in urine output or significant dehydration with vomiting or loose stools.  Recommended repeat BMP on Monday however he has difficulty with transportation and is unable to come on Monday but he does have an appointment in our clinic on Wednesday 6/11.  We discussed potential symptoms from hyperkalemia and that if he has any of these he should call the clinic or go to an urgent care/ED for lab work.  Otherwise plan will be to repeat BMP stat on Wednesday in the clinic.   BMP also has a high anion gap and low bicarb which is suspicious for issues with blood transport.  However I am unaware of this causing hyperkalemia and there is no noted hemolysis.

## 2024-04-12 LAB — AEROBIC/ANAEROBIC CULTURE W GRAM STAIN (SURGICAL/DEEP WOUND): Gram Stain: NONE SEEN

## 2024-04-14 ENCOUNTER — Inpatient Hospital Stay (HOSPITAL_COMMUNITY)
Admission: EM | Admit: 2024-04-14 | Discharge: 2024-04-21 | DRG: 908 | Disposition: A | Discharge status: Skilled Nursing Facility | Source: Ambulatory Visit | Attending: Infectious Diseases | Admitting: Infectious Diseases

## 2024-04-14 ENCOUNTER — Ambulatory Visit (INDEPENDENT_AMBULATORY_CARE_PROVIDER_SITE_OTHER): Admitting: Podiatry

## 2024-04-14 ENCOUNTER — Encounter: Admitting: Student

## 2024-04-14 DIAGNOSIS — Z794 Long term (current) use of insulin: Secondary | ICD-10-CM

## 2024-04-14 DIAGNOSIS — N183 Chronic kidney disease, stage 3 unspecified: Secondary | ICD-10-CM | POA: Diagnosis not present

## 2024-04-14 DIAGNOSIS — M25562 Pain in left knee: Secondary | ICD-10-CM | POA: Diagnosis present

## 2024-04-14 DIAGNOSIS — E875 Hyperkalemia: Secondary | ICD-10-CM | POA: Diagnosis present

## 2024-04-14 DIAGNOSIS — S9032XA Contusion of left foot, initial encounter: Secondary | ICD-10-CM | POA: Diagnosis present

## 2024-04-14 DIAGNOSIS — E1122 Type 2 diabetes mellitus with diabetic chronic kidney disease: Secondary | ICD-10-CM | POA: Diagnosis present

## 2024-04-14 DIAGNOSIS — M86672 Other chronic osteomyelitis, left ankle and foot: Secondary | ICD-10-CM

## 2024-04-14 DIAGNOSIS — T8130XA Disruption of wound, unspecified, initial encounter: Principal | ICD-10-CM | POA: Diagnosis present

## 2024-04-14 DIAGNOSIS — M25561 Pain in right knee: Secondary | ICD-10-CM | POA: Diagnosis present

## 2024-04-14 DIAGNOSIS — E1159 Type 2 diabetes mellitus with other circulatory complications: Secondary | ICD-10-CM | POA: Diagnosis not present

## 2024-04-14 DIAGNOSIS — E11610 Type 2 diabetes mellitus with diabetic neuropathic arthropathy: Secondary | ICD-10-CM | POA: Diagnosis present

## 2024-04-14 DIAGNOSIS — N1832 Chronic kidney disease, stage 3b: Secondary | ICD-10-CM | POA: Diagnosis present

## 2024-04-14 DIAGNOSIS — F32A Depression, unspecified: Secondary | ICD-10-CM | POA: Diagnosis present

## 2024-04-14 DIAGNOSIS — B0052 Herpesviral keratitis: Secondary | ICD-10-CM | POA: Diagnosis present

## 2024-04-14 DIAGNOSIS — Z9889 Other specified postprocedural states: Secondary | ICD-10-CM

## 2024-04-14 DIAGNOSIS — L219 Seborrheic dermatitis, unspecified: Secondary | ICD-10-CM | POA: Diagnosis present

## 2024-04-14 DIAGNOSIS — T8781 Dehiscence of amputation stump: Secondary | ICD-10-CM | POA: Diagnosis not present

## 2024-04-14 DIAGNOSIS — S9032XD Contusion of left foot, subsequent encounter: Secondary | ICD-10-CM | POA: Diagnosis not present

## 2024-04-14 DIAGNOSIS — M86472 Chronic osteomyelitis with draining sinus, left ankle and foot: Secondary | ICD-10-CM | POA: Diagnosis present

## 2024-04-14 DIAGNOSIS — E1165 Type 2 diabetes mellitus with hyperglycemia: Secondary | ICD-10-CM | POA: Diagnosis present

## 2024-04-14 DIAGNOSIS — Z89439 Acquired absence of unspecified foot: Secondary | ICD-10-CM

## 2024-04-14 DIAGNOSIS — T8131XA Disruption of external operation (surgical) wound, not elsewhere classified, initial encounter: Secondary | ICD-10-CM | POA: Diagnosis present

## 2024-04-14 DIAGNOSIS — E119 Type 2 diabetes mellitus without complications: Secondary | ICD-10-CM

## 2024-04-14 DIAGNOSIS — Z89432 Acquired absence of left foot: Secondary | ICD-10-CM

## 2024-04-14 LAB — SURGICAL PCR SCREEN
MRSA, PCR: POSITIVE — AB
Staphylococcus aureus: POSITIVE — AB

## 2024-04-14 LAB — CBC
HCT: 26.9 % — ABNORMAL LOW (ref 39.0–52.0)
Hemoglobin: 9.4 g/dL — ABNORMAL LOW (ref 13.0–17.0)
MCH: 28.2 pg (ref 26.0–34.0)
MCHC: 34.9 g/dL (ref 30.0–36.0)
MCV: 80.8 fL (ref 80.0–100.0)
Platelets: 240 10*3/uL (ref 150–400)
RBC: 3.33 MIL/uL — ABNORMAL LOW (ref 4.22–5.81)
RDW: 13.1 % (ref 11.5–15.5)
WBC: 12 10*3/uL — ABNORMAL HIGH (ref 4.0–10.5)
nRBC: 0 % (ref 0.0–0.2)

## 2024-04-14 LAB — BASIC METABOLIC PANEL WITH GFR
Anion gap: 13 (ref 5–15)
BUN: 31 mg/dL — ABNORMAL HIGH (ref 6–20)
CO2: 20 mmol/L — ABNORMAL LOW (ref 22–32)
Calcium: 9.1 mg/dL (ref 8.9–10.3)
Chloride: 99 mmol/L (ref 98–111)
Creatinine, Ser: 2.17 mg/dL — ABNORMAL HIGH (ref 0.61–1.24)
GFR, Estimated: 39 mL/min — ABNORMAL LOW (ref >60)
Glucose, Bld: 125 mg/dL — ABNORMAL HIGH (ref 70–99)
Potassium: 4.1 mmol/L (ref 3.5–5.1)
Sodium: 132 mmol/L — ABNORMAL LOW (ref 135–145)

## 2024-04-14 LAB — GLUCOSE, CAPILLARY
Glucose-Capillary: 114 mg/dL — ABNORMAL HIGH (ref 70–99)
Glucose-Capillary: 183 mg/dL — ABNORMAL HIGH (ref 70–99)

## 2024-04-14 MED ORDER — ROSUVASTATIN CALCIUM 20 MG PO TABS
20.0000 mg | ORAL_TABLET | Freq: Every day | ORAL | Status: DC
  Administered 2024-04-14 – 2024-04-21 (×7): 20 mg via ORAL
  Filled 2024-04-14 (×8): qty 1

## 2024-04-14 MED ORDER — INSULIN ASPART 100 UNIT/ML IJ SOLN
0.0000 [IU] | Freq: Three times a day (TID) | INTRAMUSCULAR | Status: DC
  Administered 2024-04-15 – 2024-04-16 (×3): 3 [IU] via SUBCUTANEOUS
  Administered 2024-04-16 (×2): 5 [IU] via SUBCUTANEOUS
  Administered 2024-04-17 (×2): 3 [IU] via SUBCUTANEOUS
  Administered 2024-04-17 – 2024-04-18 (×2): 5 [IU] via SUBCUTANEOUS
  Administered 2024-04-18: 2 [IU] via SUBCUTANEOUS
  Administered 2024-04-18 – 2024-04-19 (×2): 5 [IU] via SUBCUTANEOUS
  Administered 2024-04-19 (×2): 3 [IU] via SUBCUTANEOUS
  Administered 2024-04-21: 2 [IU] via SUBCUTANEOUS
  Administered 2024-04-21 (×2): 3 [IU] via SUBCUTANEOUS

## 2024-04-14 MED ORDER — CITALOPRAM HYDROBROMIDE 20 MG PO TABS
20.0000 mg | ORAL_TABLET | Freq: Every day | ORAL | Status: DC
  Administered 2024-04-14: 20 mg via ORAL
  Filled 2024-04-14: qty 1

## 2024-04-14 MED ORDER — ACYCLOVIR 400 MG PO TABS
400.0000 mg | ORAL_TABLET | Freq: Two times a day (BID) | ORAL | Status: DC
  Administered 2024-04-14 – 2024-04-21 (×12): 400 mg via ORAL
  Filled 2024-04-14 (×15): qty 1

## 2024-04-14 NOTE — Progress Notes (Signed)
  Subjective:  Patient ID: Carl Conley, male    DOB: Sep 16, 1986,  MRN: [id number]  Chief Complaint  Patient presents with   Routine Post Op    RM2: POV#1-DOS: 04/06/24: Revision of transmetatarsal to Lisfranc amputation, left foot, Excision of ulceration and complex wound closure 4 x 2 cm, left foot    DOS: 04/06/24 Procedure: 1.  Revision of transmetatarsal to Lisfranc amputation, left foot 2.  Excision of ulceration and complex wound closure 4 x 2 cm, left foot  38 y.o. male seen for post op check.  Patient reports he did put some weight down on the left foot.  He was having difficulty getting in and out of the bathroom with a walker.  Has noticed a lot of bleeding from the left foot amputation site.   Concerned that some of the sutures have popped and the wound is opened up.  Review of Systems: Negative except as noted in the HPI. Denies N/V/F/Ch.   Objective:   Constitutional Well developed. Well nourished.  Vascular Foot warm and well perfused. Capillary refill normal to all digits.   No calf pain with palpation  Neurologic Normal speech. Oriented to person, place, and time. Epicritic sensation Absent to amp site level   Dermatologic Amp site with significant hematoma and erythema.  There is some sanguinous drainage from the central aspect.  There has been dehiscence of the plantar medial flap.  Maceration of the surrounding tissues.   Orthopedic: S/p lisfranc amputation L foot   Radiographs: Interval resection of the remaining metatarsal bones to TMTJ level, no erosive changes seen  Pathology:  A. FOOT, LEFT MID, AMPUTATION:  - No acute osteomyelitis identified.  - No malignancy identified.    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   1 week s/p Lisfranc amputation, ulceration excision and closure - Unfortunately due to patient putting weight down the left foot there has been a dehiscence and hematoma formation of the left foot amputation site. -Due to significant hematoma as well as erythema I recommend proceeding to the [facility] via direct admission.  I will call for direct mission later today.  If he does not hear from them go to the ED. -[name] past midnight for possible OR tomorrow for left foot debridement of hematoma and possible wound closure versus graft and VAC application -Begin broad-spectrum IV antibiotic upon admission - no advanced imaging required -XR: expected post op changes -WB Status: NWB in post op shoe        Maridee Shoemaker, DPM [facility] / [facility]

## 2024-04-14 NOTE — Progress Notes (Signed)
 Pt seen in room in wheelchair. Alert/oriented in no apparent distress. Welcome guide/menu provided with instructions, pt verbalized understanding of instructions.Orientated to room/equipments.  Hospital valuables policy has been discussed with no complaints. Hospital bed in lowest postiion with 3 siderails up, call bell/room phone within reach,and all wheels locked upon leaving the room.

## 2024-04-14 NOTE — H&P (Signed)
 Date: 04/14/2024               Patient Name:  Carl Conley MRN: [id number]  DOB: 1986/03/29 Age / Sex: 38 y.o., male   PCP: Chrissie Coupe, MD         Medical Service: Internal Medicine Teaching Service         Attending Physician: Dr. Cherylene Corrente, MD      First Contact:   Cody Das, MS-3    Second Contact: Jose Ngo, MD        Pager:  [phone]  Third Contact: Adria Hopkins, MD    Pager:  [phone]       After Hours  (After 5pm / First Contact Pager: [phone]  weekends / holidays): Second Contact Pager: [phone]   SUBJECTIVE   Chief Complaint: Wound dehiscence   History of Present Illness: Carl Conley is a 38 y.o. 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 Mr. Bebout was admitted for osteomyelitis of his left foot recently, and had surgery with podiatry to debride infected bone. He was discharged in good condition. Unfortunately at his podiatry appointment today his surgical wound was found to be dehisced and direct admission was recommended to revise the wound. He has lost his balance a couple of times at home, he wonders if he put too much weight on his left foot then. He has been feeling well otherwise, no fevers or chills. No increased pain of left foot or leg, no swelling, erythema, pus, or tenderness either. Denies palpitations, tachypnea. He was discharged on antibiotics after last hospitalization and finished them without problems.   Meds:  Acetaminophen  325 mg 2 tablets by mouth every 6 hours PRN Acyclovir  400 mg tablet by mouth twice daily  Citalopram  20 mg tablet by mouth daily  Insuline glargine 10u injection daily  Ketoconazole  shampoo 1 application topically twice a week  Rosuvastatin  20 mg by mouth daily   Past Medical  History 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   Depression 10/31/2016   Diabetes mellitus (HCC)    dx date 2004 at age 78   Low back pain 09/01/2016   Visual snow syndrome     Past Surgical History Past Surgical History:  Procedure Laterality Date   ABDOMINAL AORTOGRAM W/LOWER EXTREMITY N/A 11/26/2022   Procedure: ABDOMINAL AORTOGRAM W/LOWER EXTREMITY;  Surgeon: Kayla Part, MD;  Location: [facility] INVASIVE CV LAB;  Service: Cardiovascular;  Laterality: N/A;   AMPUTATION Right 11/19/2022   Procedure: RIGHT FOOT SECOND TOE AMPUTATION, ANKLE WOUND DEBRIDEMENT AND BONE BIOPSY;  Surgeon: Evertt Hoe, DPM;  Location: [facility] OR;  Service: Podiatry;  Laterality: Right;   AMPUTATION Left 11/19/2022   Procedure: LEFT FOOT PARTIAL SECOND RAY AMPUTATION, LEFT THIRD METATARSAL BONE BIOPSY, LEFT FIRST METATARSAL BONE BIOPSY;  Surgeon: Evertt Hoe, DPM;  Location: [facility] OR;  Service: Podiatry;  Laterality: Left;   METATARSAL HEAD EXCISION Left 04/06/2024   Procedure: EXCISION, METATARSAL BONE, HEAD;  Surgeon: Evertt Hoe, DPM;  Location: [facility] OR;  Service: Orthopedics/Podiatry;  Laterality: Left;  Left foot I&D with resection 2nd metatarsal, wound vac application   TRANSMETATARSAL AMPUTATION Left 11/22/2022   Procedure: TRANSMETATARSAL AMPUTATION LEFT FOOT, TENDON ACHILLES LENGTHENING;  Surgeon: Evertt Hoe, DPM;  Location: [facility] OR;  Service: Podiatry;  Laterality: Left;   WISDOM TOOTH EXTRACTION      Social:  - Lives alone - Not employed currently, in the process of obtaining disability  - Independent with ADLs and IADLs, does not drive - Wears glasses, visual impairment on right eye and also with night time vision  - Ambulates without assistance - PCP: Saad Nooruddin - Denies tobacco, alcohol, or illicit drug use   Family History:  Family History  Problem Relation Age of Onset   Depression Mother      Allergies: Allergies as of 04/14/2024 - Review Complete 04/14/2024  Allergen Reaction Noted   Doxycycline  Nausea And Vomiting 12/18/2022    Review of Systems: A complete ROS was negative except as per HPI.   OBJECTIVE:   Physical Exam: Blood pressure 122/80, pulse 98, temperature 99.2 F (37.3 C), resp. rate 16, SpO2 97%.  Constitutional: alert and oriented, well-appearing laying in bed in no acute distress Eyes: conjunctiva non-erythematous Cardiovascular: regular rate and rhythm, no m/r/g Pulmonary/Chest: normal work of breathing on room air, lungs clear to auscultation bilaterally Abdominal: soft, non-tender, non-distended Neurological: alert & oriented x 3, not able to feel touch sensation in lower extremities  MSK: Took blood-soaked dressing down on left foot. Wound dehiscence with missing stitches, swelling with erythema and hematoma. No pus, drainage, or foul smell. Replaced with clean dressing. No lymphadenopathy.  Skin: red excoriations on lower extremities  Psych: pleasant   Labs: CBC    Component Value Date/Time   WBC 12.0 (H) 04/14/2024 2011   RBC 3.33 (L) 04/14/2024 2011   HGB 9.4 (L) 04/14/2024 2011   HGB 13.6 02/06/2023 1446   HCT 26.9 (L) 04/14/2024 2011   HCT 40.2 02/06/2023 1446   PLT 240 04/14/2024 2011   PLT 205 02/06/2023 1446   MCV 80.8 04/14/2024 2011   MCV 80 02/06/2023 1446   MCH 28.2 04/14/2024 2011   MCHC 34.9 04/14/2024 2011   RDW 13.1 04/14/2024 2011   RDW 15.7 (H) 02/06/2023 1446   LYMPHSABS 3.1 04/05/2024 2037   MONOABS 0.6 04/05/2024 2037   EOSABS 0.4 04/05/2024 2037   BASOSABS 0.1 04/05/2024 2037     CMP     Component Value Date/Time   NA 136 04/10/2024 0913   K 5.3 (H) 04/10/2024 0913   CL 101 04/10/2024 0913   CO2 15 (L) 04/10/2024 0913   GLUCOSE 166 (H) 04/10/2024 0913   GLUCOSE 241 (H) 04/08/2024 0518   BUN 35 (H) 04/10/2024 0913   CREATININE 2.36 (H) 04/10/2024 0913   CREATININE 0.78 02/12/2012 2053   CALCIUM  9.4  04/10/2024 0913   PROT 6.9 11/27/2022 0537   PROT 7.4 07/17/2021 1408   ALBUMIN 2.7 (L) 11/27/2022 0537   ALBUMIN 4.9 07/17/2021 1408   AST 17 11/27/2022 0537   ALT 16 11/27/2022 0537   ALKPHOS 49 11/27/2022 0537   BILITOT 0.3 11/27/2022 0537   BILITOT 0.7 07/17/2021 1408   GFRNONAA 36 (L) 04/08/2024 0518   GFRAA 112 02/04/2020 1406      Imaging:  DG Foot 2 Views Left CLINICAL DATA:  Postoperative state.  EXAM: LEFT FOOT - 2 VIEW  COMPARISON:  01/09/2024  FINDINGS: Interval resection of the metatarsals. Postsurgical change in the operative bed soft tissues with edema and skin staples in place. No erosive change of the included tarsal bones  IMPRESSION: Interval resection of the metatarsals.  Electronically Signed   By: Chadwick Colonel M.D.   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 Admitted for surgical intervention of wound dehiscence. Patient is stable with no active infection or pain.   Principal Problem:   Wound dehiscence Currently stable.  Seems like this was from weightbearing based on the history tells.  Currently there are no signs of infection.  The podiatry note was reviewed, no purulence or signs of abscess on exam there.  At present his bandage has bloody drainage.  No spreading erythema, no popliteal lymphadenopathy, no fevers.  Took his dressing down which was soaked with blood and iodine , but no objective signs of infection around wound.  We will admit and he will have  surgery for revision of his wound tomorrow.  Based on my assessment I feel like the benefits of broad-spectrum IV antibiotics do not outweigh the risks at present.  If there happens to be signs of new infection in the foot during his debridement tomorrow then antibiotics can be started after source control is achieved.  Active Problems:   Diabetes (HCC) Stable.  No hyperglycemia on admission.  Will start correction scale insulin  with meals.  He will be n.p.o. at midnight for surgery.   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  He finished a course of Augmentin  and linezolid  for this after surgery, last dose around 2 days ago.  Surgical pathology did not show any acute osteomyelitis.  CKD (chronic kidney disease) stage 3, GFR 30-59 ml/min (HCC) Hyperkalemia- resolved   Cr 2.17, at baseline. With hyperkalemia at a recent clinic visit. K now at 4.1. Stable. Continue to monitor with BMP.  History of herpes keratitis of right eye Stable, no flare. Restart home acyclovir .  History of depression Restart home citalopram .  Diet: Normal VTE: SCDs IVF: None Code: Full   Prior to Admission Living Arrangement: Home Anticipated Discharge Location: Home Barriers to Discharge: Surgical intervention  Dispo: Admit patient to Observation with expected length of stay less than 2 midnights.  Signed: Oliva Beth, Medical Student Medical Student, MS3 04/14/2024, 8:31 PM   Attestation for Student Documentation:  I personally was present and performed or re-performed the history, physical exam and medical decision-making activities of this service and have verified that the service and findings are accurately documented in the student's note.  Adria Hopkins MD 04/14/2024, 9:52 PM   Please contact IM Residency On-Call Pager at: [phone] or [phone].

## 2024-04-14 NOTE — Hospital Course (Addendum)
 Carl Conley is a 38 y.o.male with a history of recent left TMA revision for chronic osteomyelitis, T2DM, and CKD stage 3a who was admitted to the Internal Teaching Service at [facility] for wound dehiscence and revision. His hospital course is detailed below:  Wound dehiscence of left TMA s/p revision  On 04/06/2024, patient underwent TMA revision of L foot for chronic osteomyelitis and completed a week of oral antibiotic therapy. Wound dehisced, prompting direct admission for podiatry revision. On 04/15/2024, podiatry closed with amniotic graft and disposable incisional wound VAC. On 04/17/2024, wound cultures grew abundant klebsiella pneumoniae and acinetobacter calcoaceticus/baumannii complex, prompting transition from Augmentin  and linezolid  to Unasyn and linezolid . Patient was HDS and had no signs or symptoms of infection throughout his hospitalization. Patient was discharged to SNF for short-term rehabilitation on Unasyn and linezolid  for one week. - Continue Unasyn 3 g Q6H and linezolid  600 mg BID for 7 days--take final doses on 04/21/24)  - Hold citalopram  while on linezolid  given risk of serotonin syndrome - Restart citalopram  on 04/22/24  Chronic Stable Conditions  T2DM CKD 3a Moderate SSI inpatient. Kidney function at baseline (Cr ~2.2) throughout his hospitalization. - Recommend GLP-1 at outpatient follow-up - Continue home Crestor  20 mg daily  History of herpes keratitis of right eye, stable - Continue home acyclovir  400 mg BID

## 2024-04-15 ENCOUNTER — Inpatient Hospital Stay (HOSPITAL_COMMUNITY)

## 2024-04-15 ENCOUNTER — Encounter (HOSPITAL_COMMUNITY)
Admission: EM | Disposition: A | Discharge status: Skilled Nursing Facility | Payer: Self-pay | Source: Ambulatory Visit | Attending: Internal Medicine

## 2024-04-15 ENCOUNTER — Encounter: Admitting: Student

## 2024-04-15 ENCOUNTER — Encounter (HOSPITAL_COMMUNITY): Payer: Self-pay | Admitting: Internal Medicine

## 2024-04-15 ENCOUNTER — Other Ambulatory Visit: Payer: Self-pay

## 2024-04-15 DIAGNOSIS — Z794 Long term (current) use of insulin: Secondary | ICD-10-CM

## 2024-04-15 DIAGNOSIS — S9032XA Contusion of left foot, initial encounter: Secondary | ICD-10-CM | POA: Diagnosis not present

## 2024-04-15 DIAGNOSIS — T8130XA Disruption of wound, unspecified, initial encounter: Secondary | ICD-10-CM

## 2024-04-15 DIAGNOSIS — N183 Chronic kidney disease, stage 3 unspecified: Secondary | ICD-10-CM | POA: Diagnosis not present

## 2024-04-15 DIAGNOSIS — S9032XD Contusion of left foot, subsequent encounter: Secondary | ICD-10-CM | POA: Diagnosis not present

## 2024-04-15 DIAGNOSIS — E1122 Type 2 diabetes mellitus with diabetic chronic kidney disease: Secondary | ICD-10-CM | POA: Diagnosis not present

## 2024-04-15 HISTORY — PX: INCISION AND DRAINAGE OF WOUND: SHX1803

## 2024-04-15 HISTORY — PX: APPLICATION OF WOUND VAC: SHX5189

## 2024-04-15 LAB — GLUCOSE, CAPILLARY
Glucose-Capillary: 167 mg/dL — ABNORMAL HIGH (ref 70–99)
Glucose-Capillary: 102 mg/dL — ABNORMAL HIGH (ref 70–99)
Glucose-Capillary: 127 mg/dL — ABNORMAL HIGH (ref 70–99)
Glucose-Capillary: 157 mg/dL — ABNORMAL HIGH (ref 70–99)
Glucose-Capillary: 285 mg/dL — ABNORMAL HIGH (ref 70–99)

## 2024-04-15 SURGERY — IRRIGATION AND DEBRIDEMENT WOUND
Anesthesia: General | Laterality: Left

## 2024-04-15 MED ORDER — FENTANYL CITRATE (PF) 250 MCG/5ML IJ SOLN
INTRAMUSCULAR | Status: DC | PRN
  Administered 2024-04-15 (×2): 50 ug via INTRAVENOUS

## 2024-04-15 MED ORDER — DEXAMETHASONE SODIUM PHOSPHATE 10 MG/ML IJ SOLN
INTRAMUSCULAR | Status: DC | PRN
  Administered 2024-04-15: 10 mg via INTRAVENOUS

## 2024-04-15 MED ORDER — MIDAZOLAM HCL 2 MG/2ML IJ SOLN
INTRAMUSCULAR | Status: AC
  Filled 2024-04-15: qty 2

## 2024-04-15 MED ORDER — OXYCODONE HCL 5 MG PO TABS: 5.0000 mg | ORAL_TABLET | Freq: Once | ORAL | Status: DC | PRN

## 2024-04-15 MED ORDER — BUPIVACAINE HCL (PF) 0.5 % IJ SOLN
INTRAMUSCULAR | Status: DC | PRN
  Administered 2024-04-15: 10 mL

## 2024-04-15 MED ORDER — ONDANSETRON HCL 4 MG/2ML IJ SOLN
INTRAMUSCULAR | Status: DC | PRN
  Administered 2024-04-15: 4 mg via INTRAVENOUS

## 2024-04-15 MED ORDER — CEFAZOLIN SODIUM 1 G IJ SOLR
INTRAMUSCULAR | Status: AC
  Filled 2024-04-15: qty 20

## 2024-04-15 MED ORDER — BUPIVACAINE HCL (PF) 0.5 % IJ SOLN
INTRAMUSCULAR | Status: AC
  Filled 2024-04-15: qty 30

## 2024-04-15 MED ORDER — LINEZOLID 600 MG PO TABS
600.0000 mg | ORAL_TABLET | Freq: Two times a day (BID) | ORAL | Status: DC
  Administered 2024-04-15 – 2024-04-19 (×10): 600 mg via ORAL
  Filled 2024-04-15 (×11): qty 1

## 2024-04-15 MED ORDER — EPHEDRINE SULFATE-NACL 50-0.9 MG/10ML-% IV SOSY
PREFILLED_SYRINGE | INTRAVENOUS | Status: DC | PRN
  Administered 2024-04-15: 10 mg via INTRAVENOUS

## 2024-04-15 MED ORDER — OXYCODONE HCL 5 MG/5ML PO SOLN: 5.0000 mg | Freq: Once | ORAL | Status: DC | PRN

## 2024-04-15 MED ORDER — ONDANSETRON HCL 4 MG/2ML IJ SOLN: 4.0000 mg | Freq: Four times a day (QID) | INTRAMUSCULAR | Status: DC | PRN

## 2024-04-15 MED ORDER — PROPOFOL 10 MG/ML IV BOLUS
INTRAVENOUS | Status: AC
  Filled 2024-04-15: qty 20

## 2024-04-15 MED ORDER — FENTANYL CITRATE (PF) 250 MCG/5ML IJ SOLN
INTRAMUSCULAR | Status: AC
  Filled 2024-04-15: qty 5

## 2024-04-15 MED ORDER — PHENYLEPHRINE 80 MCG/ML (10ML) SYRINGE FOR IV PUSH (FOR BLOOD PRESSURE SUPPORT)
PREFILLED_SYRINGE | INTRAVENOUS | Status: AC
  Filled 2024-04-15: qty 10

## 2024-04-15 MED ORDER — ORAL CARE MOUTH RINSE: 15.0000 mL | Freq: Once | OROMUCOSAL | Status: AC

## 2024-04-15 MED ORDER — SODIUM CHLORIDE 0.9 % IV SOLN: INTRAVENOUS | Status: DC

## 2024-04-15 MED ORDER — CEFAZOLIN SODIUM-DEXTROSE 2-3 GM-%(50ML) IV SOLR
INTRAVENOUS | Status: DC | PRN
Start: 2024-04-15 — End: 2024-04-15
  Administered 2024-04-15: 2 g via INTRAVENOUS

## 2024-04-15 MED ORDER — FENTANYL CITRATE (PF) 100 MCG/2ML IJ SOLN: 25.0000 ug | INTRAMUSCULAR | Status: DC | PRN

## 2024-04-15 MED ORDER — MUPIROCIN 2 % EX OINT
1.0000 | TOPICAL_OINTMENT | Freq: Two times a day (BID) | CUTANEOUS | Status: AC
  Administered 2024-04-15 – 2024-04-19 (×10): 1 via NASAL
  Filled 2024-04-15 (×2): qty 22

## 2024-04-15 MED ORDER — MIDAZOLAM HCL 2 MG/2ML IJ SOLN
INTRAMUSCULAR | Status: DC | PRN
  Administered 2024-04-15: 2 mg via INTRAVENOUS

## 2024-04-15 MED ORDER — LACTATED RINGERS IV SOLN: INTRAVENOUS | Status: DC

## 2024-04-15 MED ORDER — 0.9 % SODIUM CHLORIDE (POUR BTL) OPTIME
TOPICAL | Status: DC | PRN
  Administered 2024-04-15: 1000 mL

## 2024-04-15 MED ORDER — AMOXICILLIN-POT CLAVULANATE 875-125 MG PO TABS
1.0000 | ORAL_TABLET | Freq: Two times a day (BID) | ORAL | Status: DC
  Administered 2024-04-15 – 2024-04-17 (×5): 1 via ORAL
  Filled 2024-04-15 (×6): qty 1

## 2024-04-15 MED ORDER — ACETAMINOPHEN 325 MG PO TABS
650.0000 mg | ORAL_TABLET | Freq: Four times a day (QID) | ORAL | Status: DC | PRN
  Administered 2024-04-18: 650 mg via ORAL
  Filled 2024-04-15: qty 2

## 2024-04-15 MED ORDER — SODIUM CHLORIDE 0.9 % IR SOLN
Status: DC | PRN
  Administered 2024-04-15: 3000 mL

## 2024-04-15 MED ORDER — CHLORHEXIDINE GLUCONATE 0.12 % MT SOLN
15.0000 mL | Freq: Once | OROMUCOSAL | Status: AC
Start: 2024-04-15 — End: 2024-04-15

## 2024-04-15 MED ORDER — CHLORHEXIDINE GLUCONATE CLOTH 2 % EX PADS
6.0000 | MEDICATED_PAD | Freq: Every day | CUTANEOUS | Status: AC
  Administered 2024-04-15 – 2024-04-19 (×5): 6 via TOPICAL

## 2024-04-15 MED ORDER — ONDANSETRON HCL 4 MG/2ML IJ SOLN
INTRAMUSCULAR | Status: AC
  Filled 2024-04-15: qty 2

## 2024-04-15 MED ORDER — LIDOCAINE 2% (20 MG/ML) 5 ML SYRINGE
INTRAMUSCULAR | Status: AC
  Filled 2024-04-15: qty 5

## 2024-04-15 MED ORDER — CHLORHEXIDINE GLUCONATE 0.12 % MT SOLN
OROMUCOSAL | Status: AC
  Administered 2024-04-15: 15 mL via OROMUCOSAL
  Filled 2024-04-15: qty 15

## 2024-04-15 MED ORDER — EPHEDRINE 5 MG/ML INJ
INTRAVENOUS | Status: AC
  Filled 2024-04-15: qty 5

## 2024-04-15 MED ORDER — PHENYLEPHRINE 80 MCG/ML (10ML) SYRINGE FOR IV PUSH (FOR BLOOD PRESSURE SUPPORT)
PREFILLED_SYRINGE | INTRAVENOUS | Status: DC | PRN
  Administered 2024-04-15 (×4): 160 ug via INTRAVENOUS
  Administered 2024-04-15: 80 ug via INTRAVENOUS

## 2024-04-15 MED ORDER — PROPOFOL 10 MG/ML IV BOLUS
INTRAVENOUS | Status: DC | PRN
  Administered 2024-04-15: 200 mg via INTRAVENOUS

## 2024-04-15 MED ORDER — LIDOCAINE 2% (20 MG/ML) 5 ML SYRINGE
INTRAMUSCULAR | Status: DC | PRN
  Administered 2024-04-15: 60 mg via INTRAVENOUS

## 2024-04-15 SURGICAL SUPPLY — 51 items
ALLOGRAFT AMNI BIOVANCE 5X5 1L (Graft) IMPLANT
BLADE SURG 15 STRL LF DISP TIS (BLADE) ×3 IMPLANT
BNDG COMPR ESMARK 4X3 LF (GAUZE/BANDAGES/DRESSINGS) ×3 IMPLANT
BNDG ELASTIC 3INX 5YD STR LF (GAUZE/BANDAGES/DRESSINGS) ×3 IMPLANT
BNDG ELASTIC 4INX 5YD STR LF (GAUZE/BANDAGES/DRESSINGS) ×3 IMPLANT
BNDG GAUZE DERMACEA FLUFF 4 (GAUZE/BANDAGES/DRESSINGS) ×3 IMPLANT
CHLORAPREP W/TINT 26 (MISCELLANEOUS) ×3 IMPLANT
CNTNR URN SCR LID CUP LEK RST (MISCELLANEOUS) ×3 IMPLANT
COVER BACK TABLE 60X90IN (DRAPES) ×3 IMPLANT
CUFF TOURN SGL QUICK 18X4 (TOURNIQUET CUFF) ×3 IMPLANT
CUFF TRNQT CYL 24X4X16.5-23 (TOURNIQUET CUFF) ×3 IMPLANT
DRAPE 3/4 80X56 (DRAPES) ×3 IMPLANT
DRAPE EXTREMITY T 121X128X90 (DISPOSABLE) ×3 IMPLANT
DRAPE SHEET LG 3/4 BI-LAMINATE (DRAPES) ×3 IMPLANT
DRAPE U-SHAPE 47X51 STRL (DRAPES) ×3 IMPLANT
DRESSING PEEL AND PLC PRVNA 13 (GAUZE/BANDAGES/DRESSINGS) IMPLANT
ELECT REM PT RETURN 15FT ADLT (MISCELLANEOUS) ×3 IMPLANT
GAUZE SPONGE 4X4 12PLY STRL (GAUZE/BANDAGES/DRESSINGS) ×3 IMPLANT
GAUZE XEROFORM 1X8 LF (GAUZE/BANDAGES/DRESSINGS) ×3 IMPLANT
GLOVE BIO SURGEON STRL SZ7.5 (GLOVE) ×3 IMPLANT
GLOVE BIOGEL PI IND STRL 7.5 (GLOVE) ×3 IMPLANT
GOWN STRL REUS W/ TWL XL LVL3 (GOWN DISPOSABLE) ×3 IMPLANT
KIT BASIN OR (CUSTOM PROCEDURE TRAY) ×3 IMPLANT
KIT DRSG PREVENA PLUS 7DAY 125 (MISCELLANEOUS) IMPLANT
KIT TURNOVER KIT B (KITS) IMPLANT
MANIFOLD NEPTUNE II (INSTRUMENTS) ×3 IMPLANT
NDL HYPO 25X1 1.5 SAFETY (NEEDLE) ×3 IMPLANT
NEEDLE HYPO 25X1 1.5 SAFETY (NEEDLE) ×2 IMPLANT
NS IRRIG 1000ML POUR BTL (IV SOLUTION) ×3 IMPLANT
PADDING CAST ABS COTTON 4X4 ST (CAST SUPPLIES) ×3 IMPLANT
PENCIL SMOKE EVACUATOR (MISCELLANEOUS) IMPLANT
SET HNDPC FAN SPRY TIP SCT (DISPOSABLE) IMPLANT
SET IRRIG Y TYPE TUR BLADDER L (SET/KITS/TRAYS/PACK) ×3 IMPLANT
SOL .9 NS 3000ML IRR UROMATIC (IV SOLUTION) IMPLANT
SPONGE T-LAP 4X18 ~~LOC~~+RFID (SPONGE) ×3 IMPLANT
STAPLER SKIN PROX 35W (STAPLE) IMPLANT
STAPLER SKIN PROX WIDE 3.9 (STAPLE) ×3 IMPLANT
STOCKINETTE 6 STRL (DRAPES) ×3 IMPLANT
SUCTION TUBE FRAZIER 10FR DISP (SUCTIONS) ×3 IMPLANT
SUT ETHILON 3 0 PS 1 (SUTURE) ×3 IMPLANT
SUT ETHILON 4 0 PS 2 18 (SUTURE) ×3 IMPLANT
SUT MNCRL AB 3-0 PS2 18 (SUTURE) ×3 IMPLANT
SUT MNCRL AB 4-0 PS2 18 (SUTURE) ×3 IMPLANT
SUT PROLENE 2 0 CT2 30 (SUTURE) IMPLANT
SUT VIC AB 2-0 SH 27XBRD (SUTURE) ×3 IMPLANT
SYR BULB EAR ULCER 3OZ GRN STR (SYRINGE) ×3 IMPLANT
SYR CONTROL 10ML LL (SYRINGE) ×3 IMPLANT
TUBE CONNECTING 12X1/4 (SUCTIONS) ×3 IMPLANT
TUBE IRRIGATION SET MISONIX (TUBING) ×3 IMPLANT
UNDERPAD 30X36 HEAVY ABSORB (UNDERPADS AND DIAPERS) ×3 IMPLANT
YANKAUER SUCT BULB TIP NO VENT (SUCTIONS) ×3 IMPLANT

## 2024-04-15 NOTE — Progress Notes (Signed)
 PT Cancellation Note  Patient Details Name: REYNOLDO MAINER MRN: [id number] DOB: 1986-07-18   Cancelled Treatment:    Reason Eval/Treat Not Completed: Patient at procedure or test/unavailable (PT consult appreciated and chart reviewed. Pt off floor for I&D of L foot wound. Will follow-up for PT evaulation as schedule permits.)  Glenford Lanes, PT, DPT Acute Rehabilitation Services Office: [phone] Secure Chat Preferred  Riva Chester 04/15/2024, 12:55 PM

## 2024-04-15 NOTE — Consult Note (Signed)
 PODIATRY CONSULTATION  NAME Carl Conley MRN [id number] DOB 03/05/1986 DOA 04/14/2024   Reason for consult: Left foot hematoma and dehiscence  Attending/Consulting physician: W. Winfrey MD  History of present illness: Carl Conley is a 38 y.o. 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 [name] was admitted for osteomyelitis of his left foot recently, and had surgery with podiatry to debride infected bone. He was discharged in good condition. Unfortunately at his podiatry appointment today his surgical wound was found to be dehisced and direct admission was recommended to revise the wound. He has lost his balance a couple of times at home, he wonders if he put too much weight on his left foot then. He has been feeling well otherwise, no fevers or chills. No increased pain of left foot or leg, no swelling, erythema, pus, or tenderness either. Denies palpitations, tachypnea. He was discharged on antibiotics after last hospitalization and finished them without problems.  Pt seen yesterday and sent in due to hematoma and dehiscence of left foot. Advised him we need to revise the surgical site and he remains high risk for limb loss on the left side if this does not heal or he develops residual infection.     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   Depression 10/31/2016   Diabetes mellitus (HCC)    dx date 2004 at age 22   Low back pain 09/01/2016   Visual snow syndrome        Latest Ref Rng & Units 04/14/2024    8:11 PM 04/08/2024    5:18 AM 04/07/2024    5:57 AM  CBC  WBC 4.0 - 10.5 K/uL 12.0  10.9  13.6   Hemoglobin 13.0 - 17.0 g/dL 9.4  40.1  02.7   Hematocrit 39.0 - 52.0 % 26.9  28.1  30.7   Platelets 150 - 400 K/uL 240  205  234         Latest Ref Rng & Units 04/14/2024    8:11 PM 04/10/2024    9:13 AM 04/08/2024    5:18 AM  BMP  Glucose 70 - 99 mg/dL 253  664  403   BUN 6 - 20 mg/dL 31  35  31   Creatinine 0.61 - 1.24 mg/dL 4.74  2.59  5.63   BUN/Creat Ratio 9 - 20  15    Sodium 135 - 145 mmol/L 132  136  129   Potassium 3.5 - 5.1 mmol/L 4.1  5.3  4.3   Chloride 98 - 111 mmol/L 99  101  99   CO2 22 - 32 mmol/L 20  15  21    Calcium  8.9 - 10.3 mg/dL 9.1  9.4  8.9       Physical Exam: Lower Extremity Exam Constitutional Well developed. Well nourished. Vascular Foot warm and well perfused. Capillary refill normal to all digits.   No calf pain with palpation Neurologic Normal speech. Oriented to person, place, and time. Epicritic sensation Absent to amp site level  Dermatologic Amp site with significant hematoma and erythema.  There is some sanguinous drainage from the central aspect.  There has been dehiscence of the plantar medial flap.  Maceration of the surrounding tissues.  Orthopedic: S/p lisfranc amputation  L foot    ASSESSMENT/PLAN OF CARE 38 y.o. 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  - NPO for OR today for Left foot debridement of hematoma and possible closure of wound vs wound vac application. He agrees to proceed.  - Continue IV abx broad spectrum pending further culture data - Anticoagulation: Hold pending OR - Wound care: none pre op - WB status: NWB to LLE - Will continue to follow   Thank you for the consult.  Please contact me directly with any questions or concerns.           Maridee Shoemaker, DPM [facility] / [facility]    [address] [place], [place] [address]                Office [phone]  Fax [phone]

## 2024-04-15 NOTE — Transfer of Care (Signed)
 Immediate Anesthesia Transfer of Care Note  Patient: Carl Conley  Procedure(s) Performed: IRRIGATION AND DEBRIDEMENT WOUND (Left) APPLICATION, WOUND VAC, PREVENA  Patient Location: PACU  Anesthesia Type:General  Level of Consciousness: awake, alert , oriented, patient cooperative, and responds to stimulation  Airway & Oxygen Therapy: Patient Spontanous Breathing and Patient connected to nasal cannula oxygen  Post-op Assessment: Report given to RN and Post -op Vital signs reviewed and stable  Post vital signs: Reviewed and stable  Last Vitals:  Vitals Value Taken Time  BP 101/70 04/15/24 1440  Temp    Pulse 89 04/15/24 1441  Resp 13 04/15/24 1441  SpO2 95 % 04/15/24 1441  Vitals shown include unfiled device data.  Last Pain:  Vitals:   04/15/24 1302  TempSrc:   PainSc: 0-No pain         Complications: No notable events documented.

## 2024-04-15 NOTE — Plan of Care (Signed)
  Problem: Education: Goal: Knowledge of General Education information will improve Description: Including pain rating scale, medication(s)/side effects and non-pharmacologic comfort measures Outcome: Progressing   Problem: Clinical Measurements: Goal: Will remain free from infection Outcome: Progressing   Problem: Pain Managment: Goal: General experience of comfort will improve and/or be controlled Outcome: Progressing

## 2024-04-15 NOTE — Anesthesia Preprocedure Evaluation (Signed)
 Anesthesia Evaluation  Patient identified by MRN, date of birth, ID band Patient awake    Reviewed: Allergy & Precautions, H&P , NPO status , Patient's Chart, lab work & pertinent test results  Airway Mallampati: II   Neck ROM: full    Dental   Pulmonary neg pulmonary ROS   breath sounds clear to auscultation       Cardiovascular negative cardio ROS  Rhythm:regular Rate:Normal     Neuro/Psych  PSYCHIATRIC DISORDERS  Depression       GI/Hepatic   Endo/Other  diabetes, Insulin  Dependent    Renal/GU Renal InsufficiencyRenal disease     Musculoskeletal   Abdominal   Peds  Hematology   Anesthesia Other Findings   Reproductive/Obstetrics                             Anesthesia Physical Anesthesia Plan  ASA: 3  Anesthesia Plan: General   Post-op Pain Management:    Induction: Intravenous  PONV Risk Score and Plan: 2 and Ondansetron , Dexamethasone, Midazolam  and Treatment may vary due to age or medical condition  Airway Management Planned: LMA  Additional Equipment:   Intra-op Plan:   Post-operative Plan: Extubation in OR  Informed Consent: I have reviewed the patients History and Physical, chart, labs and discussed the procedure including the risks, benefits and alternatives for the proposed anesthesia with the patient or authorized representative who has indicated his/her understanding and acceptance.     Dental advisory given  Plan Discussed with: CRNA, Anesthesiologist and Surgeon  Anesthesia Plan Comments:        Anesthesia Quick Evaluation

## 2024-04-15 NOTE — Progress Notes (Signed)
 Orthopedic Tech Progress Note Patient Details:  Carl Conley 1986-10-14 [id number]  Ortho Devices Type of Ortho Device: Postop shoe/boot Ortho Device/Splint Location: LLE Ortho Device/Splint Interventions: Application   Post Interventions Patient Tolerated: Well  Carl Conley 04/15/2024, 2:56 PM

## 2024-04-15 NOTE — Progress Notes (Signed)
   Subjective: 38 year old with recent revision of left transmetatarsal amputation for chronic osteomyelitis presents directly from his podiatrist office after he was found to have postoperative wound dehiscence. Admitted for surgical intervention of wound dehiscence.   Today, he denies fevers, chills, or foot pain. We discussed therapy moving forwards and he is agreeable that he would need more help with recovery and is open to rehab facilities. He also expressed interest in starting a GLP-1 which we can further discuss outpatient.   Objective:  Vital signs in last 24 hours: Vitals:   04/14/24 1851 04/14/24 2100 04/15/24 0500 04/15/24 0857  BP: 122/80 121/81 123/73 126/74  Pulse: 98 99 66 69  Resp: 16  16   Temp: 99.2 F (37.3 C) 98.8 F (37.1 C) 98.2 F (36.8 C) 98.2 F (36.8 C)  TempSrc:  Oral Oral   SpO2: 97% 99% 99% 100%   Physical Exam: General:NAD, resting in bed  Cardiac:RRR Pulmonary:normal effort on room air  Neuro:awake, alert, participating in conversation  MSK: Left foot is wrapped in dry/clean bandage  Skin:warm and dry  Psych: normal mood and affect       Latest Ref Rng & Units 04/14/2024    8:11 PM 04/10/2024    9:13 AM 04/08/2024    5:18 AM  BMP  Glucose 70 - 99 mg/dL 295  621  308   BUN 6 - 20 mg/dL 31  35  31   Creatinine 0.61 - 1.24 mg/dL 6.57  8.46  9.62   BUN/Creat Ratio 9 - 20  15    Sodium 135 - 145 mmol/L 132  136  129   Potassium 3.5 - 5.1 mmol/L 4.1  5.3  4.3   Chloride 98 - 111 mmol/L 99  101  99   CO2 22 - 32 mmol/L 20  15  21    Calcium  8.9 - 10.3 mg/dL 9.1  9.4  8.9      Assessment/Plan:  Principal Problem:   Wound dehiscence Active Problems:   Diabetes (HCC)   History of transmetatarsal amputation of foot (HCC)   CKD (chronic kidney disease) stage 3, GFR 30-59 ml/min (HCC)   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 Wound dehiscence is due to patient bearing weight at home. Fortunately, he does not have signs or symptoms of infection.  Plan: -Podiatry is planning an I&D with possible wound vacuum placement today -No indication for IV ABX at this time -PT/OT postoperative evaluation -I suspect patient will need inpatient rehab vs SNF  -Remain NPO at this time   T2DM Continue moderate SSI while in patient -Will recommend GLP-1 discussion for outpatient follow up   CKD stage 3 Stable, encouraged oral hydration while in hospital -GLP-1, outpatient follow up  -Bmp tomorrow morning   History of herpes keratitis of right eye Stable, no flare. Continue home acyclovir .   History of depression Continue home citalopram .  Resolved Problems:  __________________________________  Code Status: Full  VTE Prophylaxis: SCD Diet:NPO IVF:N/A Barriers to Discharge:Wound Dehiscence, PT evaluation   Dispo: Anticipated discharge in approximately 2 day(s).   Aurora Lees, DO 04/15/2024, 11:18 AM Please Page [phone]

## 2024-04-15 NOTE — Op Note (Signed)
 Full Operative Report  Date of Operation: 2:29 PM, 04/15/2024   Patient: Carl Conley - 38 y.o. male  Surgeon: Evertt Hoe, DPM   Assistant: None  Diagnosis: LEFT FOOT HEMATOMA AND DEHISCENCE  Procedure:  1.  Left foot irrigation and debridement of hematoma 2.  Application amniotic graft bio Vance single-layer 5 x 5 cm 3.  Complex wound closure 6 x 2 cm, left foot 4.  Application disposable wound VAC therapy 6 x 1 cm, left foot    Anesthesia: Anesthesia type not filed in the log.  Ellena Gurney, MD  Anesthesiologist: Ellena Gurney, MD CRNA: Ballard Levels, CRNA   Estimated Blood Loss: 20 mL  Hemostasis: 1) Anatomical dissection, mechanical compression, electrocautery 2) no tourniquet was used during procedure  Implants: Implant Name Type Inv. Item Serial No. Manufacturer Lot No. LRB No. Used Action  ALLOGRAFT AMNI BIOVANCE 5X5 1L - [id number] Graft ALLOGRAFT AMNI BIOVANCE 5X5 1L  [employer] [id number] Left 1 Implanted    Materials: Prolene 2-0  Injectables: 1) Pre-operatively: 10 cc of 50:50 mixture 1%lidocaine  plain and 0.5% marcaine  plain 2) Post-operatively: None   Specimens: - Pathology: None - Microbiology: Hematoma for culture   Antibiotics: Ancef 2 g given preop  Drains: None  Complications: Patient tolerated the procedure well without complication.   Operative findings: As below in detailed report  Indications for Procedure: Carl Conley presents to Evertt Hoe, DPM with a chief complaint of dehiscence of his left foot Lisfranc amputation with hematoma formation following prior surgery approximately 1 week ago.  Secondary to patient weightbearing too much against recommendation.  The patient has failed conservative treatments of various modalities. At this time the patient has elected to proceed with surgical correction. All alternatives, risks, and complications of the procedures were thoroughly explained to the patient.  Patient exhibits appropriate understanding of all discussion points and informed consent was signed and obtained in the chart with no guarantees to surgical outcome given or implied.  Description of Procedure: Patient was brought to the operating room. Patient remained on their hospital bed in the supine position. A surgical timeout was performed and all members of the operating room, the procedure, and the surgical site were identified. anesthesia occurred as per anesthesia record. Local anesthetic as previously described was then injected about the operative field in a local infiltrative block.  The operative lower extremity as noted above was then prepped and draped in the usual sterile manner. The following procedure then began.  Attention was directed to the left foot Lisfranc amputation site.  There was noted to be significant dehiscence of the amputation site.  This was primarily at the central aspect where the 2 plantar flaps converted with the dorsal flap.  Any remaining sutures and staples that were still intact were then removed so as to open up the surgical site.  There is significant hematoma formation within the surgical cavity.  This was then debrided excisionally with removal of any hematoma formation with rongeur and curette to healthy bleeding tissue base.  The underlying midfoot tarsal bones appeared very healthy without evidence of infection.  Rongeur was used to debride the tissue margins of any hematoma or necrotic tissues.  Overall the flaps did appear healthy and viable.  There was 1 area on the dorsal lateral aspect of the amputation site that appeared to be slightly necrotic.  The surgical cavity was then irrigated with 3 L of sterile saline via pulse lavage.  Following irrigation and debridement there  is no further hematoma or any evidence of infection present in the surgical cavity.  Decision was then made to implant a amniotic graft to promote healing of the amputation site.  A  single layer bio Vance amniotic graft approximately 5 x 5 cm was then placed within the surgical cavity.  This was then closed over as below.  Complex closure of the wound dehiscence was then performed with layered 2-0 Prolene suture.  The closure was approximately 6 x 2 cm.  The tissue flaps were well coapted and there was not undue tension on the flaps upon closure.  Then in order to promote further healing and hopefully reduce his risk of dehiscence further decision made to apply a disposable Prevena wound VAC.  The Prevena wound VAC was placed over the wound and adhered with wound VAC drape.  This was set to continuous suction at 125 mmHg per the machine specs.  Good suction was obtained with no leaks.  The surgical site was then dressed with 4 x 4 Kerlix Ace wrap. The patient tolerated both the procedure and anesthesia well with vital signs stable throughout. The patient was transferred in good condition and all vital signs stable  from the OR to recovery under the discretion of anesthesia.  Condition: Vital signs stable, neurovascular status unchanged from preoperative   Surgical plan:  Significant hematoma debrided from the Lisfranc amputation site.  He is now again closed primarily with amniotic graft and disposable incisional wound VAC.  This wound VAC will remain intact for approximately 5 to 7 days before being removed.  No evidence of deep infection.  Recommend switch to p.o. antibiotics for 2 weeks per prior culture data.  Strict nonweightbearing.  May benefit from short-term rehab to aid in nonweightbearing of left foot.  Still at high risk for necrosis of the amputation site given repeat surgery.   The patient will be nonweightbearing in a postop shoe to the operative limb until further instructed. The dressing is to remain clean, dry, and intact. Will continue to follow unless noted elsewhere.   Russ Course, DPM [facility]

## 2024-04-15 NOTE — Anesthesia Procedure Notes (Signed)
 Procedure Name: LMA Insertion Date/Time: 04/15/2024 1:37 PM  Performed by: Ballard Levels, CRNAPre-anesthesia Checklist: Patient identified, Emergency Drugs available, Suction available and Patient being monitored Patient Re-evaluated:Patient Re-evaluated prior to induction Oxygen Delivery Method: Circle System Utilized Preoxygenation: Pre-oxygenation with 100% oxygen Induction Type: IV induction Ventilation: Mask ventilation without difficulty LMA: LMA inserted LMA Size: 5.0 Number of attempts: 1 Airway Equipment and Method: Bite block Placement Confirmation: positive ETCO2 Tube secured with: Tape Dental Injury: Teeth and Oropharynx as per pre-operative assessment

## 2024-04-15 NOTE — Plan of Care (Signed)

## 2024-04-16 ENCOUNTER — Encounter (HOSPITAL_COMMUNITY): Payer: Self-pay | Admitting: Podiatry

## 2024-04-16 LAB — CBC
HCT: 24.9 % — ABNORMAL LOW (ref 39.0–52.0)
Hemoglobin: 8.9 g/dL — ABNORMAL LOW (ref 13.0–17.0)
MCH: 28.5 pg (ref 26.0–34.0)
MCHC: 35.7 g/dL (ref 30.0–36.0)
MCV: 79.8 fL — ABNORMAL LOW (ref 80.0–100.0)
Platelets: 230 10*3/uL (ref 150–400)
RBC: 3.12 MIL/uL — ABNORMAL LOW (ref 4.22–5.81)
RDW: 13.1 % (ref 11.5–15.5)
WBC: 13.9 10*3/uL — ABNORMAL HIGH (ref 4.0–10.5)
nRBC: 0 % (ref 0.0–0.2)

## 2024-04-16 LAB — GLUCOSE, CAPILLARY
Glucose-Capillary: 201 mg/dL — ABNORMAL HIGH (ref 70–99)
Glucose-Capillary: 182 mg/dL — ABNORMAL HIGH (ref 70–99)
Glucose-Capillary: 218 mg/dL — ABNORMAL HIGH (ref 70–99)
Glucose-Capillary: 208 mg/dL — ABNORMAL HIGH (ref 70–99)

## 2024-04-16 LAB — BASIC METABOLIC PANEL WITH GFR
Anion gap: 10 (ref 5–15)
BUN: 30 mg/dL — ABNORMAL HIGH (ref 6–20)
CO2: 21 mmol/L — ABNORMAL LOW (ref 22–32)
Calcium: 9 mg/dL (ref 8.9–10.3)
Chloride: 101 mmol/L (ref 98–111)
Creatinine, Ser: 2.2 mg/dL — ABNORMAL HIGH (ref 0.61–1.24)
GFR, Estimated: 38 mL/min — ABNORMAL LOW (ref >60)
Glucose, Bld: 188 mg/dL — ABNORMAL HIGH (ref 70–99)
Potassium: 4.1 mmol/L (ref 3.5–5.1)
Sodium: 132 mmol/L — ABNORMAL LOW (ref 135–145)

## 2024-04-16 MED ORDER — POLYETHYLENE GLYCOL 3350 17 G PO PACK
17.0000 g | PACK | Freq: Every day | ORAL | Status: DC
  Administered 2024-04-17: 17 g via ORAL
  Filled 2024-04-16 (×5): qty 1

## 2024-04-16 MED ORDER — MELATONIN 5 MG PO TABS
5.0000 mg | ORAL_TABLET | Freq: Every day | ORAL | Status: DC
  Administered 2024-04-16 – 2024-04-20 (×5): 5 mg via ORAL
  Filled 2024-04-16 (×5): qty 1

## 2024-04-16 MED ORDER — ENOXAPARIN SODIUM 60 MG/0.6ML IJ SOSY
60.0000 mg | PREFILLED_SYRINGE | INTRAMUSCULAR | Status: DC
  Administered 2024-04-16 – 2024-04-19 (×4): 60 mg via SUBCUTANEOUS
  Filled 2024-04-16 (×4): qty 0.6

## 2024-04-16 NOTE — NC FL2 (Signed)
 [place]  MEDICAID [id number] LEVEL OF CARE FORM     IDENTIFICATION  Patient Name: Carl Conley Birthdate: 1986/04/14 Sex: male Admission Date (Current Location): 04/14/2024  [place] Number:  Ernesto Heady [id number] K Facility and Address:  [facility], 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) 04/06/2024   Open wound 04/06/2024   Rash and nonspecific skin eruption 04/05/2024   CKD (chronic kidney disease) stage 3, GFR 30-59 ml/min (HCC) 04/05/2024   Diabetic ulcer of Left foot, limited to breakdown of skin (HCC) 12/10/2023   Diabetic ulcer of toe of right foot associated with type 2 diabetes mellitus (HCC) 08/12/2023   Flat affect 08/12/2023   Acute kidney injury (HCC) 02/07/2023   Anemia 02/07/2023   History of transmetatarsal amputation of foot (HCC) 01/27/2023   Amputation of toe of right foot (HCC) 01/27/2023   Dependent for transportation 01/14/2023   Low blood pressure 01/14/2023   Encounter for screening involving social determinants of health (SDoH) 12/13/2022   Pressure injury of skin 12/03/2022   Diabetic osteomyelitis (HCC) 11/01/2022   Healthcare maintenance 07/17/2021   Seborrheic dermatitis 11/11/2020   NAFLD (nonalcoholic fatty liver disease) 63/87/5643   ADHD    Depression 10/31/2016    Diabetes (HCC) 04/13/2016    Orientation RESPIRATION BLADDER Height & Weight     Self, Time, Situation, Place  Normal Continent Weight: 261 lb 7.5 oz (118.6 kg) Height:  5' 11 (180.3 cm)  BEHAVIORAL SYMPTOMS/MOOD NEUROLOGICAL BOWEL NUTRITION STATUS      Continent Diet (see discharge summary)  AMBULATORY STATUS COMMUNICATION OF NEEDS Skin   Limited Assist Verbally Surgical wounds, Skin abrasions                       Personal Care Assistance Level of Assistance  Bathing, Feeding, Dressing Bathing Assistance: Independent Feeding assistance: Independent Dressing Assistance: Independent     Functional Limitations Info  Sight, Hearing, Speech Sight Info: Adequate Hearing Info: Adequate Speech Info: Adequate    SPECIAL CARE FACTORS FREQUENCY  PT (By licensed PT), OT (By licensed OT)     PT Frequency: 5x week OT Frequency: 5x week            Contractures Contractures Info: Not present    Additional Factors Info  Code Status, Allergies, Insulin  Sliding Scale Code Status Info: full Allergies Info: doxycycline    Insulin  Sliding Scale Info: novolog : see discharge summary       Current Medications (04/16/2024):  This is the current hospital active medication list Current Facility-Administered Medications  Medication Dose Route Frequency Provider Last Rate Last Admin   acetaminophen  (TYLENOL ) tablet 650 mg  650 mg Oral Q6H PRN Bender, Emily, DO       acyclovir  (ZOVIRAX ) tablet 400 mg  400 mg Oral BID McLendon, Michael, MD   400 mg at  04/16/24 1004   amoxicillin -clavulanate (AUGMENTIN ) 875-125 MG per tablet 1 tablet  1 tablet Oral Q12H Bender, Sherline Distel, DO   1 tablet at 04/16/24 0953   Chlorhexidine  Gluconate Cloth 2 % PADS 6 each  6 each Topical Daily Cherylene Corrente, MD   6 each at 04/16/24 1004   enoxaparin  (LOVENOX ) injection 60 mg  60 mg Subcutaneous Q24H Bender, Emily, DO   60 mg at 04/16/24 0850   insulin  aspart (novoLOG ) injection 0-15 Units  0-15 Units  Subcutaneous TID WC McLendon, Michael, MD   3 Units at 04/16/24 1300   linezolid  (ZYVOX ) tablet 600 mg  600 mg Oral Q12H Bender, Emily, DO   600 mg at 04/16/24 0953   melatonin tablet 5 mg  5 mg Oral QHS Cherylene Corrente, MD       mupirocin  ointment (BACTROBAN ) 2 % 1 Application  1 Application Nasal BID Cherylene Corrente, MD   1 Application at 04/16/24 0954   polyethylene glycol (MIRALAX  / GLYCOLAX ) packet 17 g  17 g Oral Daily Bender, Emily, DO       rosuvastatin  (CRESTOR ) tablet 20 mg  20 mg Oral Daily McLendon, Michael, MD   20 mg at 04/16/24 1610     Discharge Medications: Please see discharge summary for a list of discharge medications.  Relevant Imaging Results:  Relevant Lab Results:   Additional Information SSN: [id number]  Elspeth Hals, LCSW

## 2024-04-16 NOTE — Plan of Care (Signed)

## 2024-04-16 NOTE — Evaluation (Signed)
 Physical Therapy Evaluation Patient Details Name: Carl Conley MRN: [id number] DOB: Mar 08, 1986 Today's Date: 04/16/2024  History of Present Illness  Patient is a 38 year old male with left foot hematoma and dehiscence of revision of TMA to lisfranc amputation s/p I&D. PMH: T2DM, charcot arthropathy s/p TMA, CKD III, HSV keratitis of the right eye, and seborrheic dermatitis  Clinical Impression  Patient is agreeable to PT evaluation. He reports using a rolling walker for limited distance ambulation in the home but admits to difficulty maintaining NWB LLE. He lives alone with steps to enter the home.  Today the patient required safety cues with mobility as well as techniques to maintain NWB of LLE with functional activity. Limited distance ambulation in room with rolling walker. Overall activity tolerance limited by fatigue and generalized weakness. He was able to keep weight off LLE with all mobility today. Patient is requesting to go to rehab at discharge. Recommend rehabilitation < 3 hours/day after this hospital stay. PT will continue to follow to maximize independence.       If plan is discharge home, recommend the following: A little help with walking and/or transfers;A little help with bathing/dressing/bathroom;Help with stairs or ramp for entrance;Assistance with cooking/housework;Assist for transportation   Can travel by private vehicle   Yes    Equipment Recommendations Wheelchair (measurements PT);Wheelchair cushion (measurements PT)  Recommendations for Other Services       Functional Status Assessment Patient has had a recent decline in their functional status and demonstrates the ability to make significant improvements in function in a reasonable and predictable amount of time.     Precautions / Restrictions Precautions Precautions: Fall Recall of Precautions/Restrictions: Impaired Restrictions Weight Bearing Restrictions Per Provider Order: Yes LLE Weight Bearing Per  Provider Order: Non weight bearing Other Position/Activity Restrictions: post-op shoe      Mobility  Bed Mobility Overal bed mobility: Needs Assistance Bed Mobility: Supine to Sit, Sit to Supine     Supine to sit: Supervision Sit to supine: Supervision        Transfers Overall transfer level: Needs assistance Equipment used: Rolling walker (2 wheels) Transfers: Sit to/from Stand Sit to Stand: Contact guard assist, From elevated surface (patient requesting bed height raised for comfort)           General transfer comment: CGA for safety. frequent cues for techniques to maintain NWB of LLE with transfers which patient is able to demonstrate with standing from bed and from toilet    Ambulation/Gait Ambulation/Gait assistance: Contact guard assist Gait Distance (Feet): 20 Feet Assistive device: Rolling walker (2 wheels) Gait Pattern/deviations: Step-to pattern Gait velocity: decreased     General Gait Details: reinforced importance to maintain NWB with functional mobility. gait distance self limited and patient is fatigued with activity. he was able to maintain NWB at all times with standing activity. mildly impulsive at times.  Stairs            Wheelchair Mobility     Tilt Bed    Modified Rankin (Stroke Patients Only)       Balance Overall balance assessment: Needs assistance Sitting-balance support: Feet supported Sitting balance-Leahy Scale: Good     Standing balance support: Bilateral upper extremity supported, During functional activity, Reliant on assistive device for balance Standing balance-Leahy Scale: Poor Standing balance comment: heavy reliance on rolling walker for support  Pertinent Vitals/Pain Pain Assessment Pain Assessment: No/denies pain    Home Living Family/patient expects to be discharged to:: Private residence Living Arrangements: Alone Available Help at Discharge:  Family;Friend(s);Available PRN/intermittently Type of Home: House Home Access: Stairs to enter Entrance Stairs-Rails: None Entrance Stairs-Number of Steps: 2 - front porch Alternate Level Stairs-Number of Steps: full flight Home Layout: Two level;1/2 bath on main level;Bed/bath upstairs Home Equipment: Agricultural consultant (2 wheels)      Prior Function Prior Level of Function : Independent/Modified Independent             Mobility Comments: recently limited houshold distance with RW. he reports difficulty with maintaining NWB at home ADLs Comments: Mod I, has been sponge bathing due to no shower on lower level of home. meals/groceries are delivered     Extremity/Trunk Assessment   Upper Extremity Assessment Upper Extremity Assessment: Defer to OT evaluation    Lower Extremity Assessment Lower Extremity Assessment: LLE deficits/detail LLE Deficits / Details: active movement against gravity and able to maintain NWB for short distance mobility       Communication   Communication Communication: No apparent difficulties    Cognition Arousal: Alert Behavior During Therapy: WFL for tasks assessed/performed   PT - Cognitive impairments: Safety/Judgement                       PT - Cognition Comments: some delayed processing noted. safety cues required with mobility Following commands: Impaired Following commands impaired: Follows one step commands with increased time     Cueing Cueing Techniques: Verbal cues, Visual cues     General Comments General comments (skin integrity, edema, etc.): patient declined getting in the chair at end of session. he is requesting rehab placement in the [place] area if possible.    Exercises Other Exercises Other Exercises: patient performed 1 rep each of hip add/abd, SLR, heelslides, and ankle pumps to RLE. encouraged exercises in BLE for strengthening   Assessment/Plan    PT Assessment Patient needs continued PT services  PT  Problem List Decreased strength;Decreased range of motion;Decreased activity tolerance;Decreased balance;Decreased mobility;Decreased knowledge of use of DME;Decreased safety awareness;Decreased coordination       PT Treatment Interventions DME instruction;Gait training;Stair training;Functional mobility training;Therapeutic activities;Therapeutic exercise;Balance training;Neuromuscular re-education;Cognitive remediation;Patient/family education    PT Goals (Current goals can be found in the Care Plan section)  Acute Rehab PT Goals Patient Stated Goal: to go to rehab in the [place] area PT Goal Formulation: With patient Time For Goal Achievement: 04/30/24 Potential to Achieve Goals: Fair    Frequency Min 2X/week     Co-evaluation PT/OT/SLP Co-Evaluation/Treatment: Yes Reason for Co-Treatment: Complexity of the patient's impairments (multi-system involvement) PT goals addressed during session: Mobility/safety with mobility         AM-PAC PT 6 Clicks Mobility  Outcome Measure Help needed turning from your back to your side while in a flat bed without using bedrails?: None Help needed moving from lying on your back to sitting on the side of a flat bed without using bedrails?: A Little Help needed moving to and from a bed to a chair (including a wheelchair)?: A Little Help needed standing up from a chair using your arms (e.g., wheelchair or bedside chair)?: A Little Help needed to walk in hospital room?: A Little Help needed climbing 3-5 steps with a railing? : A Lot 6 Click Score: 18    End of Session   Activity Tolerance: Patient limited by fatigue;Patient tolerated treatment well  Patient left: in bed;with call bell/phone within reach;with bed alarm set   PT Visit Diagnosis: Difficulty in walking, not elsewhere classified (R26.2);Muscle weakness (generalized) (M62.81)    Time: 1610-9604 PT Time Calculation (min) (ACUTE ONLY): 25 min   Charges:   PT Evaluation $PT  Eval Moderate Complexity: 1 Mod   PT General Charges $$ ACUTE PT VISIT: 1 Visit        Ozie Bo, PT, MPT   Erlene Hawks 04/16/2024, 12:52 PM

## 2024-04-16 NOTE — Plan of Care (Signed)
   Problem: Education: Goal: Knowledge of General Education information will improve Description: Including pain rating scale, medication(s)/side effects and non-pharmacologic comfort measures Outcome: Progressing   Problem: Clinical Measurements: Goal: Will remain free from infection Outcome: Progressing

## 2024-04-16 NOTE — Discharge Instructions (Addendum)
 You came to the hospital for wound dehiscence.  We treated you with surgical repair and antibiotics    *For your wound dehiscence -We have started you on these following medications:  -Ciprofloxacin 750 mg twice a day, please finish this dose until the course is completed -Please be sure to follow up with your foot doctor  -We have stopped the following medications:  -Linezolid    *For your diabetes  -We have started you on these following medications:  -Insulin  5 units (long acting)   -Be sure to check your sugar daily. If you have low blood sugar (less than 70), please call your PCP and be evaluated  -We have stopped the following medications:  -Insulin  glargine 10 units   Please visit your family doctor on April 30, 2024 at 1:15pm    If you have any questions or concerns please feel free to call: Internal medicine clinic at [phone]   If you have any of these following symptoms, please call us  or seek care at an emergency department: -Chest Pain -Difficulty Breathing -Syncope (passing out) -Drooping of face -Slurred speech -Sudden weakness in your leg or arm -Fever -Chills   We are glad that you are feeling better, it was a pleasure to care for you!  Aurora Lees DO

## 2024-04-16 NOTE — Evaluation (Signed)
 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 Pt now s/p I&D left foot hematoma with application of wound vac on 6/11. Of note - pt s/p Left TMA revision on 04/06/24 with NWB order for 2-3 weeks. 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     Clinical Impressions At baseline pt is Independent to Mod I for ADLs and Independent with functional mobility without an AD. Pt reports he has been sponge bathing since Left TMA revision and has been ambulating limited houshold distance with RW. He reports difficulty with maintaining L NWB at home. Pt now presents with decreased activity tolerance, decreased cognition, decreased balance, and decreased safety and independence with functional tasks. Pt currently demonstrates ability to complete UB ADLs Independent to Min assist, LB ADLs with Set up/Supervision to Mod assist, and functional mobility with a RW with L post-op shoe donned with Contact guard assist, all while maintaining L LE NWB. Pt currently requires Mod to Max cues for safety during functional tasks. Pt participated well in session. Pt will benefit from acute skilled OT services to address deficits outlined below and increase safety and independence with functional tasks. Post acute discharge, pt will benefit from intensive inpatient skilled rehab services < 3 hours per day to maximize rehab potential.      If plan is discharge home, recommend the following:   A little help with walking and/or transfers;A lot of help with bathing/dressing/bathroom;Assistance with cooking/housework;Direct supervision/assist for medications management;Direct supervision/assist for  financial management;Assist for transportation;Help with stairs or ramp for entrance;Supervision due to cognitive status     Functional Status Assessment   Patient has had a recent decline in their functional status and demonstrates the ability to make significant improvements in function in a reasonable and predictable amount of time.     Equipment Recommendations   Other (comment) (TBD)     Recommendations for Other Services         Precautions/Restrictions   Precautions Precautions: Fall Recall of Precautions/Restrictions: Impaired Restrictions Weight Bearing Restrictions Per Provider Order: Yes LLE Weight Bearing Per Provider Order: Non weight bearing Other Position/Activity Restrictions: post-op shoe     Mobility Bed Mobility Overal bed mobility: Needs Assistance Bed Mobility: Supine to Sit, Sit to Supine     Supine to sit: Supervision Sit to supine: Supervision        Transfers Overall transfer level: Needs assistance Equipment used: Rolling walker (2 wheels) Transfers: Sit to/from Stand, Bed to chair/wheelchair/BSC Sit to Stand: Contact guard assist, From elevated surface (patient requesting bed height raised for comfort)     Step pivot transfers: Contact guard assist     General transfer comment: CGA for safety. frequent cues for techniques to maintain NWB of LLE with transfers which patient is able to demonstrate with standing from bed and from toilet      Balance Overall balance assessment: Needs assistance Sitting-balance support: Single extremity supported, No upper extremity supported, Feet supported Sitting balance-Leahy Scale: Good     Standing balance support: Bilateral upper extremity supported, During functional activity, Reliant on assistive device for balance Standing balance-Leahy Scale: Poor Standing balance comment: heavy reliance on rolling walker for support  ADL either performed or  assessed with clinical judgement   ADL Overall ADL's : Needs assistance/impaired Eating/Feeding: Independent;Sitting   Grooming: Minimal assistance;Standing;Cueing for safety   Upper Body Bathing: Set up;Supervision/ safety;Sitting   Lower Body Bathing: Set up;Supervison/ safety;Sitting/lateral leans;Cueing for compensatory techniques   Upper Body Dressing : Set up;Sitting   Lower Body Dressing: Supervision/safety;Sitting/lateral leans;Moderate assistance;Sit to/from stand;Cueing for compensatory techniques;Cueing for safety Lower Body Dressing Details (indicate cue type and reason): Donns/doffs R sock and L post-op shoe and demonstrates ability to thread legs in/out of clothing with Supervision in sitting and requiring Mod assist to raise/lower clothing over hips in standing. Toilet Transfer: Contact guard assist;Regular Toilet;Grab bars;Rolling walker (2 wheels) (step-pivot transfer)   Toileting- Clothing Manipulation and Hygiene: Supervision/safety;Sitting/lateral lean;Moderate assistance;Sit to/from stand;Cueing for safety;Cueing for compensatory techniques Toileting - Clothing Manipulation Details (indicate cue type and reason): Supervision for toileting hygeine in sitting and Mod assist for clothing management in standing     Functional mobility during ADLs: Contact guard assist;Rolling walker (2 wheels);Cueing for safety General ADL Comments: Pt with decreased activity tolerance and decreased safety awareness/cognition affecting functional level.     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.) Ability to See in Adequate Light: 1 Impaired Patient Visual Report: No change from baseline       Perception         Praxis         Pertinent Vitals/Pain Pain Assessment Pain Assessment: No/denies pain     Extremity/Trunk Assessment Upper Extremity Assessment Upper  Extremity Assessment: Right hand dominant;Overall WFL for tasks assessed   Lower Extremity Assessment Lower Extremity Assessment: Defer to PT evaluation LLE Deficits / Details: active movement against gravity and able to maintain NWB for short distance mobility   Cervical / Trunk Assessment Cervical / Trunk Assessment: Normal   Communication Communication Communication: No apparent difficulties;Other (comment) (requires mildly increased time for processing)   Cognition Arousal: Alert Behavior During Therapy: WFL for tasks assessed/performed Cognition: Cognition impaired, No family/caregiver present to determine baseline     Awareness: Intellectual awareness intact, Online awareness intact (intermittent online awareness) Memory impairment (select all impairments): Working memory Attention impairment (select first level of impairment): Selective attention Executive functioning impairment (select all impairments): Organization, Reasoning, Problem solving OT - Cognition Comments: Pt AAOx4 and pleasant throughout session. Pt presenting with poor safety awareness throughout session.                 Following commands: Impaired Following commands impaired: Follows one step commands with increased time, Only follows one step commands consistently     Cueing  General Comments   Cueing Techniques: Verbal cues;Visual cues  patient declined getting in the chair at end of session. he is requesting rehab placement in the [place] area if possible.   Exercises     Shoulder Instructions      Home Living Family/patient expects to be discharged to:: Private residence Living Arrangements: Alone Available Help at Discharge: Family;Friend(s);Available PRN/intermittently Type of Home: House Home Access: Stairs to enter [employer] of Steps: 2 - front porch Entrance Stairs-Rails: None Home Layout: Two level;1/2 bath on main level;Bed/bath upstairs Alternate Level  Stairs-Number of Steps: full flight   Bathroom Shower/Tub: Tub/shower unit;Door (upstairs)   Bathroom Toilet: Standard     Home Equipment: Agricultural consultant (2 wheels)          Prior Functioning/Environment Prior Level of Function : Independent/Modified Independent  Mobility Comments: Independent at baseline. Pt recently limited houshold distance with RW s/p L TMA revision. He reports difficulty with maintaining L NWB at home. ADLs Comments: Ind to Mod I, has been sponge bathing due to no shower on lower level of home. meals/groceries are delivered. Typically heats premade meals in the microwave.    OT Problem List: Decreased activity tolerance;Impaired balance (sitting and/or standing);Decreased safety awareness;Decreased knowledge of use of DME or AE;Decreased knowledge of precautions   OT Treatment/Interventions: Self-care/ADL training;Energy conservation;DME and/or AE instruction;Therapeutic activities;Cognitive remediation/compensation;Patient/family education;Balance training      OT Goals(Current goals can be found in the care plan section)   Acute Rehab OT Goals Patient Stated Goal: to heal well and be more independent OT Goal Formulation: With patient Time For Goal Achievement: 04/30/24 Potential to Achieve Goals: Good ADL Goals Pt Will Perform Lower Body Dressing: with modified independence;sitting/lateral leans;sit to/from stand (adhering to L LE WB precautions) Pt Will Transfer to Toilet: with modified independence;ambulating;regular height toilet (with least restrictive AD; adhering to L LE WB precautions) Pt Will Perform Toileting - Clothing Manipulation and hygiene: with modified independence;sitting/lateral leans;sit to/from stand (adhering to L LE WB precautions)   OT Frequency:  Min 2X/week    Co-evaluation PT/OT/SLP Co-Evaluation/Treatment: Yes Reason for Co-Treatment: Complexity of the patient's impairments (multi-system involvement) PT  goals addressed during session: Mobility/safety with mobility OT goals addressed during session: ADL's and self-care      AM-PAC OT 6 Clicks Daily Activity     Outcome Measure Help from another person eating meals?: None Help from another person taking care of personal grooming?: A Little Help from another person toileting, which includes using toliet, bedpan, or urinal?: A Lot Help from another person bathing (including washing, rinsing, drying)?: A Little Help from another person to put on and taking off regular upper body clothing?: A Little Help from another person to put on and taking off regular lower body clothing?: A Lot 6 Click Score: 17   End of Session Equipment Utilized During Treatment: Gait belt;Rolling walker (2 wheels);Other (comment) (wound vac; L post op shoe) Nurse Communication: Mobility status  Activity Tolerance: Patient tolerated treatment well Patient left: in bed;with call bell/phone within reach;with bed alarm set  OT Visit Diagnosis: Unsteadiness on feet (R26.81);Other abnormalities of gait and mobility (R26.89);Other symptoms and signs involving cognitive function                Time: 1050-1115 OT Time Calculation (min): 25 min Charges:  OT General Charges $OT Visit: 1 Visit OT Evaluation $OT Eval Moderate Complexity: 1 Mod  Ronnell Coins., OTR/L, MA Acute Rehab [phone]  Walt Gunner 04/16/2024, 1:22 PM

## 2024-04-16 NOTE — Progress Notes (Signed)
 HD#2 Subjective:   Summary: Carl Conley is a 38 y.o. male with pertinent PMH of T2DM and left TMA amputation for chronic osteomyelitis who presented with postoperative wound dehiscence and is admitted for surgical intervention of wound dehiscence.   Overnight Events:  Podiatry performed uncomplicated surgical correction with amniotic graft and disposable wound VAC.  Patient reported that the surgery went well and that he is not experiencing pain. He shared that he slept poorly overnight because the wound VAC was beeping and making noise. He also reported taking home melatonin for sleep. We informed him that taking home meds without pharmacy administration is against hospital policy. He was amenable to starting melatonin from our formulary instead.  Objective:  Vital signs in last 24 hours: Vitals:   04/15/24 1537 04/15/24 1953 04/16/24 0437 04/16/24 0844  BP: 103/69 104/73 122/73 109/63  Pulse: 92 96 73 75  Resp: 18 17 16    Temp: 98.1 F (36.7 C) 97.6 F (36.4 C) (!) 97.5 F (36.4 C) (!) 97.5 F (36.4 C)  TempSrc:    Oral  SpO2: 95% 96% 98% 98%  Weight:      Height:       Supplemental O2: Room Air SpO2: 98 %  Physical Exam:  Constitutional: Well-appearing, in no acute distress. Pulm: Normal work of breathing on room air. Extremities: L foot in ace wrap and wound VAC in place, R foot with ulcerations. Neurological: Alert, responding appropriately. Psych: Normal mood and affect.  Pertinent Labs:    Latest Ref Rng & Units 04/16/2024    6:25 AM 04/14/2024    8:11 PM 04/10/2024    9:13 AM  CMP  Glucose 70 - 99 mg/dL 132  440  102   BUN 6 - 20 mg/dL 30  31  35   Creatinine 0.61 - 1.24 mg/dL 7.25  3.66  4.40   Sodium 135 - 145 mmol/L 132  132  136   Potassium 3.5 - 5.1 mmol/L 4.1  4.1  5.3   Chloride 98 - 111 mmol/L 101  99  101   CO2 22 - 32 mmol/L 21  20  15    Calcium  8.9 - 10.3 mg/dL 9.0  9.1  9.4     Assessment/Plan:   Patient Summary: Carl Conley is  a 38 y.o. male with pertinent PMH of T2DM and left TMA amputation for chronic osteomyelitis who presented with postoperative wound dehiscence and is admitted for surgical intervention of wound dehiscence on hospital day 2.  Wound dehiscence of left TMA revision s/p podiatry revision S/p I&D left foot hematoma with application of wound vac Podiatry performed surgical correction without complication. Patient remains afebrile, HDS, and without symptoms and signs of infection of the LLE. Wound vac in place and disposable, appropriate for upcoming SNF placement.  - Continue Augmentin  875-125 mgBID and linezolid  600 mg BID for 7 days (final doses on 04/21/24)  - PT recommends SNF placement  - Social work for SNF short-term rehab placement; appreciate their support - Tylenol  650 mg Q6H PRN  - CBC with differential tomorrow morning ordered  T2DM - Moderate SSI inpatient - GLP-1 discussion outpatient  CKD 3, stable - GLP-1 as above - BMP tomorrow morning ordered  History of depression - Hold citalopram  while on linezolid  given risk of serotonin syndrome - Restart citalopram  on 04/22/24  History of herpes keratitis of right eye, stable - Continue home acyclovir  400 mg BID  Diet: Normal IVF: None VTE: Lovenox  60mg  daily  Code: Full PT/OT recs: SNF  Dispo: Anticipated discharge to Skilled nursing facility in 1 days pending SNF availability.   Lynnea Satchel  Attestation for Student Documentation:  I personally was present and performed or re-performed the history, physical exam and medical decision-making activities of this service and have verified that the service and findings are accurately documented in the student's note.  Patient is stable s/p I&D left foot hematoma with application of wound vac. He is medically stable for discharge, pending SNF placement.   Aurora Lees, DO 04/16/2024, 12:12 PM

## 2024-04-16 NOTE — TOC Initial Note (Signed)
 Transition of Care [facility]) - Initial/Assessment Note    Patient Details  Name: Carl Conley MRN: [id number] Date of Birth: 11/15/1985  [facility] CM/SW Contact:    Elspeth Hals, LCSW Phone Number: 04/16/2024, 3:01 PM  Clinical Narrative:     CSW met with pt regarding PT recommendation for SNF.  Pt is agreeable to SNF, permission given to send out referral in the hub.  Father lives in [place] and would prefer SNF in that area.  Pt from home alone, no current services.  Permission given to speak with father Sharl Davies.               Referral sent out in hub for SNF.   Expected Discharge Plan: Skilled Nursing Facility Barriers to Discharge: Continued Medical Work up, SNF Pending bed offer   Patient Goals and CMS Choice Patient states their goals for this hospitalization and ongoing recovery are:: walking          Expected Discharge Plan and Services In-house Referral: Clinical Social Work   Post Acute Care Choice: Skilled Nursing Facility Living arrangements for the past 2 months: Single Family Home                                      Prior Living Arrangements/Services Living arrangements for the past 2 months: Single Family Home Lives with:: Self Patient language and need for interpreter reviewed:: Yes Do you feel safe going back to the place where you live?: Yes      Need for Family Participation in Patient Care: No (Comment) Care giver support system in place?: Yes (comment) Current home services: Other (comment) (no current services) Criminal Activity/Legal Involvement Pertinent to Current Situation/Hospitalization: No - Comment as needed  Activities of Daily Living      Permission Sought/Granted Permission sought to share information with : Family Supports Permission granted to share information with : Yes, Verbal Permission Granted  Share Information with NAME: father Hilarie Lovely, neighbor Devra Fontana  Permission granted to  share info w AGENCY: SNF        Emotional Assessment Appearance:: Appears stated age Attitude/Demeanor/Rapport: Engaged Affect (typically observed): Appropriate, Pleasant Orientation: : Oriented to Self, Oriented to Place, Oriented to  Time, Oriented to Situation      Admission diagnosis:  Wound dehiscence [T81.30XA] Patient Active Problem List   Diagnosis Date Noted   Hematoma of left foot 04/15/2024   Wound dehiscence 04/14/2024   Hyperkalemia 04/14/2024   Herpes keratitis of right eye 04/14/2024   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 (HCC) 04/06/2024   Open wound 04/06/2024   Rash and nonspecific skin eruption 04/05/2024   CKD (chronic kidney disease) stage 3, GFR 30-59 ml/min (HCC) 04/05/2024   Diabetic ulcer of Left foot, limited to breakdown of skin (HCC) 12/10/2023   Diabetic ulcer of toe of right foot associated with type 2 diabetes mellitus (HCC) 08/12/2023   Flat affect 08/12/2023   Acute kidney injury (HCC) 02/07/2023   Anemia 02/07/2023   History of transmetatarsal amputation of foot (HCC) 01/27/2023   Amputation of toe of right foot (HCC) 01/27/2023   Dependent for transportation 01/14/2023   Low blood pressure 01/14/2023   Encounter for screening involving social determinants of health (SDoH) 12/13/2022   Pressure injury of skin 12/03/2022   Diabetic osteomyelitis (HCC) 11/01/2022   Healthcare maintenance 07/17/2021   Seborrheic dermatitis 11/11/2020  NAFLD (nonalcoholic fatty liver disease) 91/47/8295   ADHD    Depression 10/31/2016   Diabetes (HCC) 04/13/2016   PCP:  Nooruddin, Saad, MD Pharmacy:   [facility] [phone] - [place],  - [address] [place] [address] Phone: [phone] Fax: [phone]  [place] - [facility] [address], [address] [place] [address] Phone: [phone] Fax: [phone]  [place] [facility] [address] [place] [address] Phone: [phone] Fax: [phone]  [facility] [address] [place] [address] Phone: [phone] Fax: [phone]     Social Drivers of Health (SDOH) Social History: SDOH Screenings   Food Insecurity: Patient Declined (04/15/2024)  Housing: Patient Declined (04/15/2024)  Transportation Needs: Patient Declined (04/15/2024)  Utilities: Patient Declined (04/15/2024)  Depression (PHQ2-9): High Risk (03/06/2024)  Tobacco Use: Low Risk  (04/15/2024)   SDOH Interventions:     Readmission Risk Interventions    04/08/2024    3:51 PM  Readmission Risk Prevention Plan  Post Dischage Appt Complete  Medication Screening Complete  Transportation Screening Complete

## 2024-04-17 LAB — GLUCOSE, CAPILLARY
Glucose-Capillary: 190 mg/dL — ABNORMAL HIGH (ref 70–99)
Glucose-Capillary: 163 mg/dL — ABNORMAL HIGH (ref 70–99)
Glucose-Capillary: 212 mg/dL — ABNORMAL HIGH (ref 70–99)
Glucose-Capillary: 195 mg/dL — ABNORMAL HIGH (ref 70–99)

## 2024-04-17 LAB — CBC
HCT: 24.7 % — ABNORMAL LOW (ref 39.0–52.0)
Hemoglobin: 8.6 g/dL — ABNORMAL LOW (ref 13.0–17.0)
MCH: 28.6 pg (ref 26.0–34.0)
MCHC: 34.8 g/dL (ref 30.0–36.0)
MCV: 82.1 fL (ref 80.0–100.0)
Platelets: 228 10*3/uL (ref 150–400)
RBC: 3.01 MIL/uL — ABNORMAL LOW (ref 4.22–5.81)
RDW: 13.6 % (ref 11.5–15.5)
WBC: 11.3 10*3/uL — ABNORMAL HIGH (ref 4.0–10.5)
nRBC: 0 % (ref 0.0–0.2)

## 2024-04-17 LAB — BASIC METABOLIC PANEL WITH GFR
Anion gap: 10 (ref 5–15)
BUN: 34 mg/dL — ABNORMAL HIGH (ref 6–20)
CO2: 23 mmol/L (ref 22–32)
Calcium: 8.9 mg/dL (ref 8.9–10.3)
Chloride: 102 mmol/L (ref 98–111)
Creatinine, Ser: 2.36 mg/dL — ABNORMAL HIGH (ref 0.61–1.24)
GFR, Estimated: 35 mL/min — ABNORMAL LOW (ref >60)
Glucose, Bld: 203 mg/dL — ABNORMAL HIGH (ref 70–99)
Potassium: 4.3 mmol/L (ref 3.5–5.1)
Sodium: 135 mmol/L (ref 135–145)

## 2024-04-17 MED ORDER — SODIUM CHLORIDE 0.9 % IV SOLN
3.0000 g | Freq: Four times a day (QID) | INTRAVENOUS | Status: DC
  Administered 2024-04-17 – 2024-04-19 (×10): 3 g via INTRAVENOUS
  Filled 2024-04-17 (×11): qty 8

## 2024-04-17 NOTE — Progress Notes (Signed)
 Summary:  Carl Conley is a 38 y.o. male with pertinent PMH of T2DM and left TMA amputation for chronic osteomyelitis who presented with postoperative wound dehiscence s/p surgical intervention of wound dehiscence.  Subjective:  No acute events overnight.  Patient reported doing well overnight. He denied fever, chills, and pain at his surgical site. He understood that his next step is SNF for short-term rehab and that we are working on placement.  Objective:  Vital signs in last 24 hours: Vitals:   04/16/24 1439 04/16/24 1918 04/17/24 0500 04/17/24 0813  BP: 104/63 110/69 111/73 115/73  Pulse: 85 72 68 66  Resp: 18 18 19    Temp: 98.2 F (36.8 C) (!) 97.5 F (36.4 C) 98.2 F (36.8 C) 98.6 F (37 C)  TempSrc:      SpO2: 97% 99% 97% 96%  Weight:      Height:       Physical Exam: Constitutional: Well-appearing, in no acute distress. Pulm: Normal work of breathing on room air. Extremities: L foot in ace wrap and wound VAC in place. Neurological: Alert, responding appropriately. Psych: Normal mood and affect.  Pertinent Labs:     Latest Ref Rng & Units 04/17/2024    6:14 AM 04/16/2024   12:09 PM 04/14/2024    8:11 PM  CBC  WBC 4.0 - 10.5 K/uL 11.3  13.9  12.0   Hemoglobin 13.0 - 17.0 g/dL 8.6  8.9  9.4   Hematocrit 39.0 - 52.0 % 24.7  24.9  26.9   Platelets 150 - 400 K/uL 228  230  240        Latest Ref Rng & Units 04/17/2024    6:14 AM 04/16/2024    6:25 AM 04/14/2024    8:11 PM  CMP  Glucose 70 - 99 mg/dL 161  096  045   BUN 6 - 20 mg/dL 34  30  31   Creatinine 0.61 - 1.24 mg/dL 4.09  8.11  9.14   Sodium 135 - 145 mmol/L 135  132  132   Potassium 3.5 - 5.1 mmol/L 4.3  4.1  4.1   Chloride 98 - 111 mmol/L 102  101  99   CO2 22 - 32 mmol/L 23  21  20    Calcium  8.9 - 10.3 mg/dL 8.9  9.0  9.1    Micro Surgical wound culture grew abundant klebsiella pneumonia resistant to ampicillin but otherwise pan-sensitive.   Assessment/Plan: Carl Conley is a 38 y.o.  male with pertinent PMH of T2DM and left TMA amputation for chronic osteomyelitis who presented with postoperative wound dehiscence and is admitted for surgical intervention of wound dehiscence.  Wound dehiscence of left TMA revision S/p I&D left foot hematoma with application of wound vac Patient remains afebrile, HDS, and clinically well without pain. Wound vac in place and disposable, appropriate for upcoming SNF placement as recommended by PT. Plan: - Continue Augmentin  875-125 mgBID and linezolid  600 mg BID for 7 days, day 3/7 - Social work for SNF short-term rehab placement - Tylenol  650 mg Q6H PRN  - CBC tomorrow   T2DM - Moderate SSI inpatient - GLP-1 discussion outpatient   CKD 3, stable - GLP-1 as above - BMP tomorrow morning ordered   History of depression - Hold citalopram  while on linezolid  given risk of serotonin syndrome - Restart citalopram  on 04/22/24   History of herpes keratitis of right eye, stable - Continue home acyclovir  400 mg BID  Diet: Normal IVF: None VTE: Lovenox   60mg  daily  Code: Full PT/OT recs: SNF  Dispo: Anticipated discharge to SNF in approximately 1 day pending SNF availability.   Carl Conley, Medical Student 04/17/2024, 10:53 AM Please Page [phone]   Attestation for Student Documentation:  I personally was present and performed or re-performed the history, physical exam and medical decision-making activities of this service and have verified that the service and findings are accurately documented in the student's note.  Carl Lees, DO 04/17/2024, 11:40 AM

## 2024-04-17 NOTE — Progress Notes (Signed)
  Subjective:  Patient ID: Carl Conley, male    DOB: 02/24/1986,  MRN: [id number]  DOS: 04/15/24 Procedure: 1.  Left foot irrigation and debridement of hematoma 2.  Application amniotic graft bio Vance single-layer 5 x 5 cm 3.  Complex wound closure 6 x 2 cm, left foot 4.  Application disposable wound VAC therapy 6 x 1 cm, left foot  38 y.o. male seen for post op check. Pt reports doing well denies pain. We discussed plans for follow up and time frame for healing. Reinforced need to remain no weight on left foot during healing. Plan for him to go to SNF.  Review of Systems: Negative except as noted in the HPI. Denies N/V/F/Ch.   Objective:   Constitutional Well developed. Well nourished.  Vascular Foot warm and well perfused. Capillary refill normal to all digits.   No calf pain with palpation  Neurologic Normal speech. Oriented to person, place, and time. Epicritic sensation diminished to foot  Dermatologic Prevena wound vac intact with good suction and no alarms or issues detected      Orthopedic: S/p Lisfranc amputation of left foot   Radiographs: lisfranc amputation   Pathology: none sent from revision OR  Micro: ABUNDANT KLEBSIELLA PNEUMONIAE from hematoma culture  Assessment:   Left foot lisfranc amputation site dehiscence and hematoma  S/p Irrigation and debridement with graft, closure and incisional wound vac placement  Plan:  Patient was evaluated and treated and all questions answered.  POD # 2 s/p above procedures -Progressing as expected post op, prevena vac appears to be functioning well at this time. Incision is closed underlying. - Plan for DC to SNF when approved/bed avail  -XR: expected post op changes -WB Status: NWB in post op shoe to LLE -Sutures: remain intact 3-4 weeks. -Medications/ABX: 2 weeks PO abx from surgery -Dressing: prevena vac to remain on and functioning until follow up Tuesday - if this fails please remove and switch to dry  guaze dressings  - F/u Plan: Follow up next week Tuesday with me for vac removal. Stable for DC to snf when set up. My office will call to arrange follow up. Will sign off.         Maridee Shoemaker, DPM [facility] / [facility]

## 2024-04-17 NOTE — TOC Progression Note (Addendum)
 Transition of Care [facility]) - Progression Note    Patient Details  Name: Carl Conley MRN: [id number] Date of Birth: [birth year]  Transition of [facility]) CM/SW Contact  Elspeth Hals, LCSW Phone Number: 04/17/2024, 10:13 AM  Clinical Narrative:   Bed offers provided to pt on medicare choice document.  Pt would like to accept offer at [facility]. CSW spoke with [place] at [place] and she will review for auth.    1030: TC Christy: they cannot accept this medicaid, have to rescind offers for [place] and [place].  New offer in hub for [employer] and [facility], both in [place].  Discussed with pt, he will accept offer at [facility]  CSW spoke with Brittany/admissions, [phone], she will start insurance auth.   She reports will likely not be approved over the weekend.    Expected Discharge Plan: Skilled Nursing Facility Barriers to Discharge: Continued Medical Work up, SNF Pending bed offer  Expected Discharge Plan and Services In-house Referral: Clinical Social Work   Post Acute Care Choice: Skilled Nursing Facility Living arrangements for the past 2 months: Single Family Home                                       Social Determinants of Health (SDOH) Interventions SDOH Screenings   Food Insecurity: Patient Declined (04/15/2024)  Housing: Patient Declined (04/15/2024)  Transportation Needs: Patient Declined (04/15/2024)  Utilities: Patient Declined (04/15/2024)  Depression (PHQ2-9): High Risk (03/06/2024)  Tobacco Use: Low Risk  (04/15/2024)    Readmission Risk Interventions    04/08/2024    3:51 PM  Readmission Risk Prevention Plan  Post Dischage Appt Complete  Medication Screening Complete  Transportation Screening Complete

## 2024-04-17 NOTE — Progress Notes (Signed)
 Pharmacy Antibiotic Note  Carl Conley is a 38 y.o. male admitted on 04/14/2024 with wound infection.  Pharmacy has been consulted for Unasyn dosing.  Debridement on 6/13 with podiatry for dehiscence and hematoma of surgical site. Surgical culture now with K. Pnuemoniae and A. Baumanii. Currently on linezolid /augmentin , but now switching to linezolid /unasyn for acinetobacter.  Plan: Start Unasyn 3 g IV q6h Monitor culture data, susceptibilities, and renal function.  Height: 5' 11 (180.3 cm) Weight: 118.6 kg (261 lb 7.5 oz) IBW/kg (Calculated) : 75.3  Temp (24hrs), Avg:98.1 F (36.7 C), Min:97.5 F (36.4 C), Max:98.6 F (37 C)  Recent Labs  Lab 04/14/24 2011 04/16/24 0625 04/16/24 1209 04/17/24 0614  WBC 12.0*  --  13.9* 11.3*  CREATININE 2.17* 2.20*  --  2.36*    Estimated Creatinine Clearance: 55.6 mL/min (A) (by C-G formula based on SCr of 2.36 mg/dL (H)).    Allergies  Allergen Reactions   Doxycycline  Nausea And Vomiting    Microbiology results: 6/11 Wcx: K pneumo (Amp-R); A. Baumanii (susceptibilities pending) 6/10 MRSA PCR: positive  Thank you for allowing pharmacy to be a part of this patient's care.  Estela Held, PharmD PGY-2 Infectious Diseases Pharmacy Resident [facility] 04/17/2024 2:58 PM

## 2024-04-17 NOTE — Progress Notes (Signed)
 Mobility Specialist Progress Note:    04/17/24 1200  Mobility  Activity Ambulated with assistance in room  Level of Assistance Contact guard assist, steadying assist  Assistive Device Front wheel walker  Distance Ambulated (ft) 25 ft  LLE Weight Bearing Per Provider Order NWB  Activity Response Tolerated well  Mobility Referral Yes  Mobility visit 1 Mobility  Mobility Specialist Start Time (ACUTE ONLY) 1010  Mobility Specialist Stop Time (ACUTE ONLY) 1018  Mobility Specialist Time Calculation (min) (ACUTE ONLY) 8 min   Pt received in bed and agreeable. Able to come EOB w/ mod I. Ambulated in room w/ CG. No complaints throughout. Pt left in bed with call bell and bed alarm on.  D'Vante Nolon Baxter Mobility Specialist Please contact via Special educational needs teacher or Rehab office at [phone]

## 2024-04-17 NOTE — Anesthesia Postprocedure Evaluation (Signed)
 Anesthesia Post Note  Patient: Carl Conley  Procedure(s) Performed: IRRIGATION AND DEBRIDEMENT WOUND (Left) APPLICATION, WOUND VAC, PREVENA     Patient location during evaluation: PACU Anesthesia Type: General Level of consciousness: awake and alert Pain management: pain level controlled Vital Signs Assessment: post-procedure vital signs reviewed and stable Respiratory status: spontaneous breathing, nonlabored ventilation, respiratory function stable and patient connected to nasal cannula oxygen Cardiovascular status: blood pressure returned to baseline and stable Postop Assessment: no apparent nausea or vomiting Anesthetic complications: no   No notable events documented.  Last Vitals:  Vitals:   04/16/24 1918 04/17/24 0500  BP: 110/69 111/73  Pulse: 72 68  Resp: 18 19  Temp: (!) 36.4 C 36.8 C  SpO2: 99% 97%    Last Pain:  Vitals:   04/16/24 1918  TempSrc:   PainSc: 0-No pain                 [name]

## 2024-04-17 NOTE — Plan of Care (Signed)

## 2024-04-18 DIAGNOSIS — M25562 Pain in left knee: Secondary | ICD-10-CM

## 2024-04-18 DIAGNOSIS — T8781 Dehiscence of amputation stump: Secondary | ICD-10-CM

## 2024-04-18 LAB — GLUCOSE, CAPILLARY
Glucose-Capillary: 205 mg/dL — ABNORMAL HIGH (ref 70–99)
Glucose-Capillary: 145 mg/dL — ABNORMAL HIGH (ref 70–99)
Glucose-Capillary: 203 mg/dL — ABNORMAL HIGH (ref 70–99)
Glucose-Capillary: 175 mg/dL — ABNORMAL HIGH (ref 70–99)

## 2024-04-18 LAB — BASIC METABOLIC PANEL WITH GFR
Anion gap: 11 (ref 5–15)
BUN: 34 mg/dL — ABNORMAL HIGH (ref 6–20)
CO2: 21 mmol/L — ABNORMAL LOW (ref 22–32)
Calcium: 9.1 mg/dL (ref 8.9–10.3)
Chloride: 102 mmol/L (ref 98–111)
Creatinine, Ser: 2.43 mg/dL — ABNORMAL HIGH (ref 0.61–1.24)
GFR, Estimated: 34 mL/min — ABNORMAL LOW (ref >60)
Glucose, Bld: 201 mg/dL — ABNORMAL HIGH (ref 70–99)
Potassium: 4.6 mmol/L (ref 3.5–5.1)
Sodium: 134 mmol/L — ABNORMAL LOW (ref 135–145)

## 2024-04-18 LAB — CBC
HCT: 26 % — ABNORMAL LOW (ref 39.0–52.0)
Hemoglobin: 8.8 g/dL — ABNORMAL LOW (ref 13.0–17.0)
MCH: 27.9 pg (ref 26.0–34.0)
MCHC: 33.8 g/dL (ref 30.0–36.0)
MCV: 82.5 fL (ref 80.0–100.0)
Platelets: 230 10*3/uL (ref 150–400)
RBC: 3.15 MIL/uL — ABNORMAL LOW (ref 4.22–5.81)
RDW: 13.5 % (ref 11.5–15.5)
WBC: 11.1 10*3/uL — ABNORMAL HIGH (ref 4.0–10.5)
nRBC: 0 % (ref 0.0–0.2)

## 2024-04-18 MED ORDER — LIDOCAINE 5 % EX PTCH
1.0000 | MEDICATED_PATCH | CUTANEOUS | Status: DC
  Administered 2024-04-18 – 2024-04-19 (×2): 1 via TRANSDERMAL
  Filled 2024-04-18 (×3): qty 1

## 2024-04-18 MED ORDER — DICLOFENAC SODIUM 1 % EX GEL
2.0000 g | Freq: Four times a day (QID) | CUTANEOUS | Status: DC | PRN
  Administered 2024-04-18 – 2024-04-19 (×3): 2 g via TOPICAL
  Filled 2024-04-18: qty 100

## 2024-04-18 MED ORDER — OXYCODONE HCL 5 MG PO TABS
5.0000 mg | ORAL_TABLET | Freq: Once | ORAL | Status: AC
  Administered 2024-04-18: 5 mg via ORAL
  Filled 2024-04-18: qty 1

## 2024-04-18 MED ORDER — ACETAMINOPHEN 500 MG PO TABS
1000.0000 mg | ORAL_TABLET | Freq: Four times a day (QID) | ORAL | Status: DC | PRN
  Administered 2024-04-18: 1000 mg via ORAL
  Filled 2024-04-18 (×2): qty 2

## 2024-04-18 MED ORDER — ACETAMINOPHEN 325 MG PO TABS
325.0000 mg | ORAL_TABLET | Freq: Once | ORAL | Status: AC
  Administered 2024-04-18: 325 mg via ORAL
  Filled 2024-04-18: qty 1

## 2024-04-18 NOTE — Progress Notes (Signed)
 Summary:  Carl Conley is a 38 y.o. male with pertinent PMH of T2DM and left TMA amputation for chronic osteomyelitis who presented with postoperative wound dehiscence s/p surgical intervention of wound dehiscence.  Subjective:  No acute events overnight.  Patient reported doing well overnight with no concerns about his foot wound. We discussed why he was switched from Augmentin  to Unasyn, and patient expressed understanding.  Objective:  Vital signs in last 24 hours: Vitals:   04/17/24 1622 04/17/24 1929 04/18/24 0409 04/18/24 0737  BP: 110/67 119/71 113/67 118/79  Pulse: 76 79 69 72  Resp:  16 16 20   Temp: 98.2 F (36.8 C) 98.3 F (36.8 C) 98.1 F (36.7 C) 98.6 F (37 C)  TempSrc: Oral     SpO2: 99% 99% 97% 100%   Physical Exam:  Constitutional: Well-appearing, in no acute distress. Pulm: Normal work of breathing on room air. Extremities: Left foot in ace wrap and wound VAC in place, unchanged from yesterday. Neurological: Alert, responding appropriately. Psych: Normal mood and affect.  Pertinent Labs:     Latest Ref Rng & Units 04/18/2024    6:37 AM 04/17/2024    6:14 AM 04/16/2024   12:09 PM  CBC  WBC 4.0 - 10.5 K/uL 11.1  11.3  13.9   Hemoglobin 13.0 - 17.0 g/dL 8.8  8.6  8.9   Hematocrit 39.0 - 52.0 % 26.0  24.7  24.9   Platelets 150 - 400 K/uL 230  228  230        Latest Ref Rng & Units 04/18/2024    6:37 AM 04/17/2024    6:14 AM 04/16/2024    6:25 AM  CMP  Glucose 70 - 99 mg/dL 161  096  045   BUN 6 - 20 mg/dL 34  34  30   Creatinine 0.61 - 1.24 mg/dL 4.09  8.11  9.14   Sodium 135 - 145 mmol/L 134  135  132   Potassium 3.5 - 5.1 mmol/L 4.6  4.3  4.1   Chloride 98 - 111 mmol/L 102  102  101   CO2 22 - 32 mmol/L 21  23  21    Calcium  8.9 - 10.3 mg/dL 9.1  8.9  9.0    Corrected sodium in the setting of hyperglycemia: 136  Assessment/Plan: Carl Conley is a 38 y.o. male with pertinent PMH of T2DM and left TMA amputation for chronic osteomyelitis  who presented with postoperative wound dehiscence and is admitted for surgical revision of wound dehiscence.  Wound dehiscence of left TMA s/p I&D left foot hematoma with application of wound vac Patient remains afebrile, HDS, and clinically well without pain. Wound vac in place and disposable, appropriate for upcoming SNF placement as recommended by PT. Plan: - Unasyn 3 g Q6H and linezolid  600 mg BID for 7 days, day 4/7 - Social work for SNF short-term rehab placement - Tylenol  650 mg Q6H PRN   Left Knee Pain Some improving left knee pain, mainly suprapatellar but no limited movement or weight bearing limitations. No effusions, instability, or signs of infection/gout.  -voltaren gel prn  Chronic Stable Conditions  T2DM CKD 3, stable - Moderate SSI inpatient - GLP-1 discussion outpatient   History of depression - Hold citalopram  while on linezolid  given risk of serotonin syndrome - Restart citalopram  on 04/22/24   History of herpes keratitis of right eye, stable - Continue home acyclovir  400 mg BID  Diet: Normal IVF: None VTE: Lovenox  60mg  daily  Code: Full  PT/OT recs: SNF  Dispo: Anticipated discharge to SNF in approximately 2 days pending SNF availability.   Lynnea Satchel, Medical Student 04/18/2024, 9:46 AM  Attestation for Student Documentation:  I personally was present and re-performed the history, physical exam and medical decision-making activities of this service and have verified that the service and findings are accurately documented in the student's note.  Cleven Dallas, DO 04/18/2024, 1:58 PM

## 2024-04-19 ENCOUNTER — Inpatient Hospital Stay (HOSPITAL_COMMUNITY)

## 2024-04-19 LAB — GLUCOSE, CAPILLARY
Glucose-Capillary: 195 mg/dL — ABNORMAL HIGH (ref 70–99)
Glucose-Capillary: 198 mg/dL — ABNORMAL HIGH (ref 70–99)
Glucose-Capillary: 219 mg/dL — ABNORMAL HIGH (ref 70–99)

## 2024-04-19 LAB — URIC ACID: Uric Acid, Serum: 9.1 mg/dL — ABNORMAL HIGH (ref 3.7–8.6)

## 2024-04-19 MED ORDER — OXYCODONE HCL 5 MG PO TABS: 5.0000 mg | ORAL_TABLET | Freq: Three times a day (TID) | ORAL | Status: DC | PRN

## 2024-04-19 MED ORDER — INSULIN GLARGINE-YFGN 100 UNIT/ML ~~LOC~~ SOLN
5.0000 [IU] | SUBCUTANEOUS | Status: DC
  Administered 2024-04-19 – 2024-04-21 (×2): 5 [IU] via SUBCUTANEOUS
  Filled 2024-04-19 (×3): qty 0.05

## 2024-04-19 NOTE — Progress Notes (Addendum)
 Summary:  Carl Conley is a 38 y.o. male with pertinent PMH of T2DM and left TMA amputation for chronic osteomyelitis who presented with postoperative wound dehiscence s/p surgical intervention of wound dehiscence.  Subjective:  This morning he is continuing to have some right knee pain with some associated swelling now.  It did respond to the oxycodone  and some to the Voltaren/lidocaine  topical therapies.  He also has some blood in the tubing of his wound VAC which was not previously there and reports that he but a good amount of weight on his left foot once.  He denies any pain in his left foot.  Objective:  Vital signs in last 24 hours: Vitals:   04/17/24 1622 04/17/24 1929 04/18/24 0409 04/18/24 0737  BP: 110/67 119/71 113/67 118/79  Pulse: 76 79 69 72  Resp:  16 16 20   Temp: 98.2 F (36.8 C) 98.3 F (36.8 C) 98.1 F (36.7 C) 98.6 F (37 C)  TempSrc: Oral     SpO2: 99% 99% 97% 100%   Physical Exam:  Constitutional: Well-appearing, in no acute distress. Pulm: Normal work of breathing on room air. Extremities: Left foot in ace wrap and wound VAC in place, new blood in the tubing and small amount in the reservoir of the wound VAC Neurological: Alert, responding appropriately. Psych: Normal mood and affect.  Pertinent Labs:     Latest Ref Rng & Units 04/18/2024    6:37 AM 04/17/2024    6:14 AM 04/16/2024   12:09 PM  CBC  WBC 4.0 - 10.5 K/uL 11.1  11.3  13.9   Hemoglobin 13.0 - 17.0 g/dL 8.8  8.6  8.9   Hematocrit 39.0 - 52.0 % 26.0  24.7  24.9   Platelets 150 - 400 K/uL 230  228  230        Latest Ref Rng & Units 04/18/2024    6:37 AM 04/17/2024    6:14 AM 04/16/2024    6:25 AM  CMP  Glucose 70 - 99 mg/dL 657  846  962   BUN 6 - 20 mg/dL 34  34  30   Creatinine 0.61 - 1.24 mg/dL 9.52  8.41  3.24   Sodium 135 - 145 mmol/L 134  135  132   Potassium 3.5 - 5.1 mmol/L 4.6  4.3  4.1   Chloride 98 - 111 mmol/L 102  102  101   CO2 22 - 32 mmol/L 21  23  21    Calcium  8.9  - 10.3 mg/dL 9.1  8.9  9.0    Corrected sodium in the setting of hyperglycemia: 136  Assessment/Plan: CADAN MAGGART is a 38 y.o. male with pertinent PMH of T2DM and left TMA amputation for chronic osteomyelitis who presented with postoperative wound dehiscence and is admitted for surgical revision of wound dehiscence.  Wound dehiscence of left TMA s/p I&D left foot hematoma with application of wound vac Concern for repeat dehiscence after he put some weight on the left foot today and there is new blood in the wound VAC.  Wound VAC appears to be functioning normally without any alarms and his bandage is intact without swelling.  Discussed with Dr. Luster Salters, podiatry, and he recommends continuing with wound VAC in place until Dr. Rosemarie Conquest can see him tomorrow.  Patient remains afebrile, HDS. Plan: - Reevaluation with podiatry tomorrow, keep wound VAC in place until then, please contact on-call resident if there are any wound VAC alarms, large amount of blood in the reservoir,  or soiled wound dressing on the left foot - Unasyn 3 g Q6H and linezolid  600 mg BID for 7 days, day 5/7 - Social work for SNF short-term rehab placement - Tylenol  650 mg Q6H PRN   Right knee Pain Continued  right knee pain with now mild associated swelling and pain around the joint line.  Questionably small increase in warmth over the right knee but still able to ambulate without issue.   - Lidocaine /voltaren gel prn - Tylenol  1000 mg every 8 hours - Oxycodone  5 mg every 8 hours as needed - 4 view right knee x-ray - check uric acid  Chronic Stable Conditions  T2DM CKD 3, stable - Moderate SSI inpatient - GLP-1 discussion outpatient   History of depression - Hold citalopram  while on linezolid  given risk of serotonin syndrome - Restart citalopram  on 04/22/24   History of herpes keratitis of right eye, stable - Continue home acyclovir  400 mg BID  Diet: Normal IVF: None VTE: Lovenox  60mg  daily  Code:  Full PT/OT recs: SNF  Dispo: Anticipated discharge to SNF in approximately 2 days pending reevaluation by podiatry tomorrow 6/16.  Cleven Dallas, DO Internal Medicine Resident, PGY-2 Please contact the on call pager at [phone] for any urgent or emergent needs. 10:50 AM 04/19/2024

## 2024-04-19 NOTE — Progress Notes (Addendum)
 Patient Carl Conley canister full of blood. Per patient, he placed weight on his foot today during transfers. Educated on the need for strict NWB. New dressing to L foot due to blood saturation and new canister placed. MD Hayes Lipps made aware and will reach out to podiatry to notify.   1829: Per podiatry, patient is to be strict NWB, hold lovenox , and elevate leg. If surgical site continues to bleed, may remove Carl Conley and place bulky dry compression dressing.

## 2024-04-19 NOTE — Plan of Care (Signed)

## 2024-04-20 LAB — AEROBIC/ANAEROBIC CULTURE W GRAM STAIN (SURGICAL/DEEP WOUND): Gram Stain: NONE SEEN

## 2024-04-20 LAB — CBC
HCT: 22.8 % — ABNORMAL LOW (ref 39.0–52.0)
Hemoglobin: 7.8 g/dL — ABNORMAL LOW (ref 13.0–17.0)
MCH: 28.3 pg (ref 26.0–34.0)
MCHC: 34.2 g/dL (ref 30.0–36.0)
MCV: 82.6 fL (ref 80.0–100.0)
Platelets: 208 10*3/uL (ref 150–400)
RBC: 2.76 MIL/uL — ABNORMAL LOW (ref 4.22–5.81)
RDW: 14 % (ref 11.5–15.5)
WBC: 10.6 10*3/uL — ABNORMAL HIGH (ref 4.0–10.5)
nRBC: 0 % (ref 0.0–0.2)

## 2024-04-20 LAB — BASIC METABOLIC PANEL WITH GFR
Anion gap: 9 (ref 5–15)
BUN: 34 mg/dL — ABNORMAL HIGH (ref 6–20)
CO2: 21 mmol/L — ABNORMAL LOW (ref 22–32)
Calcium: 8.8 mg/dL — ABNORMAL LOW (ref 8.9–10.3)
Chloride: 102 mmol/L (ref 98–111)
Creatinine, Ser: 2.38 mg/dL — ABNORMAL HIGH (ref 0.61–1.24)
GFR, Estimated: 35 mL/min — ABNORMAL LOW (ref >60)
Glucose, Bld: 194 mg/dL — ABNORMAL HIGH (ref 70–99)
Potassium: 4.3 mmol/L (ref 3.5–5.1)
Sodium: 132 mmol/L — ABNORMAL LOW (ref 135–145)

## 2024-04-20 LAB — GLUCOSE, CAPILLARY
Glucose-Capillary: 204 mg/dL — ABNORMAL HIGH (ref 70–99)
Glucose-Capillary: 148 mg/dL — ABNORMAL HIGH (ref 70–99)
Glucose-Capillary: 190 mg/dL — ABNORMAL HIGH (ref 70–99)
Glucose-Capillary: 135 mg/dL — ABNORMAL HIGH (ref 70–99)

## 2024-04-20 MED ORDER — OXYCODONE HCL 5 MG PO TABS: 5.0000 mg | ORAL_TABLET | Freq: Three times a day (TID) | ORAL | Status: AC | PRN

## 2024-04-20 MED ORDER — CITALOPRAM HYDROBROMIDE 20 MG PO TABS
20.0000 mg | ORAL_TABLET | Freq: Every day | ORAL | Status: DC
  Filled 2024-04-20: qty 1

## 2024-04-20 MED ORDER — CIPROFLOXACIN HCL 500 MG PO TABS
750.0000 mg | ORAL_TABLET | Freq: Two times a day (BID) | ORAL | Status: DC
  Administered 2024-04-21: 750 mg via ORAL
  Filled 2024-04-20 (×3): qty 2

## 2024-04-20 NOTE — Plan of Care (Signed)

## 2024-04-20 NOTE — Progress Notes (Signed)
  Subjective:  Patient ID: Carl Conley, male    DOB: 1986/03/18,  MRN: [id number]  DOS: 04/15/24 Procedure: 1.  Left foot irrigation and debridement of hematoma 2.  Application amniotic graft bio Vance single-layer 5 x 5 cm 3.  Complex wound closure 6 x 2 cm, left foot 4.  Application disposable wound VAC therapy 6 x 1 cm, left foot  38 y.o. male seen for post op check. There was concern for bleeding into his dressing over the weekend.  Also concern that there was a lot of bloody drainage into the wound VAC canister and needed to be changed.  I directed the team to discontinue Lovenox  elevated extremity and reinforced the need for strict nonweightbearing to left lower extremity.  He reports he is trying to stay off the foot as best he can.  Plan is for discharge to rehab facility.  Review of Systems: Negative except as noted in the HPI. Denies N/V/F/Ch.   Objective:   Constitutional Well developed. Well nourished.  Vascular Foot warm and well perfused. Capillary refill normal to all digits.   No calf pain with palpation  Neurologic Normal speech. Oriented to person, place, and time. Epicritic sensation diminished to foot  Dermatologic Prevena wound vac with significant bloody drainage and clotted blood underlying the wound VAC drape on the plantar foot. Upon removal of the wound VAC drape there is no dehiscence noted however there is some maceration of the tissue due to excessive bleeding.  No significant necrosis though there is some necrotic and fibrotic tissues of the lateral dorsal aspect of the amputation site.  Overall appears viable at this time         Orthopedic: S/p Lisfranc amputation of left foot   Radiographs: lisfranc amputation   Pathology: none sent from revision OR  Micro: ABUNDANT KLEBSIELLA PNEUMONIAE from hematoma culture  Assessment:   Left foot lisfranc amputation site dehiscence and hematoma  S/p Irrigation and debridement with graft, closure and  incisional wound vac placement  Plan:  Patient was evaluated and treated and all questions answered.  POD # 5 s/p above procedures -Progressing as expected post op, due to patient placing weight on the left foot he had some bleeding through the incision in the setting of DVT prophylaxis this caused incisional bleeding, fortunately there is no significant dehiscence or necrosis of the tissue flaps at this time -Recommend we begin every other day Betadine wet-to-dry dressing to the left foot amputation site under some compression.  I cleansed the foot with hydroperoxide then placed a Betadine wet-to-dry dressing with 4 x 4 gauze ABD pad Kerlix and Ace with some compression.  This dressing change every other day next dressing change this Wednesday at rehab facility. - Plan for DC to SNF when approved/bed avail possibly today -XR: expected post op changes -WB Status: Strict NWB in post op shoe to LLE -Sutures: remain intact 3-4 weeks. -Medications/ABX: 2 weeks PO abx from surgery -Dressing: Every other day Betadine wet-to-dry dressing to left foot as above, continue this at rehab facility - F/u Plan: Follow up next week Tuesday with me amputation site check. Stable for DC to snf today. My office will call to change his follow-up appointment from tomorrow to next week Tuesday.  Will sign off        Maridee Shoemaker, DPM [facility] / [facility]

## 2024-04-20 NOTE — Progress Notes (Signed)
 Physical Therapy Treatment Patient Details Name: Carl Conley MRN: [id number] DOB: 03-04-1986 Today's Date: 04/20/2024   History of Present Illness Pt is a 38 yo male who presented 6/10 from Podiatry office to [facility] ED with wound dehiscence of revision of TMA to lisfranc amputation. Pt now s/p I&D left foot hematoma with application of wound vac on 6/11. Of note - pt s/p Left TMA revision on 04/06/24 with NWB order for 2-3 weeks. 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    PT Comments  Pt supine in bed on arrival.  He is eager to mobilize.  Remains impulsive.  C/o pain in R knee 4/10. Cold pack given at end of session.  Focused on functional mobility and LE strengthening.  HEP issued for post acute rehab.  He continues to require rehab in a post acute setting to maximize functional gains before returning home.     If plan is discharge home, recommend the following: A little help with walking and/or transfers;A little help with bathing/dressing/bathroom;Help with stairs or ramp for entrance;Assistance with cooking/housework;Assist for transportation   Can travel by private vehicle        Equipment Recommendations  Wheelchair (measurements PT);Wheelchair cushion (measurements PT)    Recommendations for Other Services       Precautions / Restrictions Precautions Precautions: Fall Recall of Precautions/Restrictions: Impaired Restrictions Weight Bearing Restrictions Per Provider Order: Yes LLE Weight Bearing Per Provider Order: Non weight bearing Other Position/Activity Restrictions: post-op shoe     Mobility  Bed Mobility Overal bed mobility: Needs Assistance Bed Mobility: Supine to Sit     Supine to sit: Supervision     General bed mobility comments: Pt able to move into sitting edge of bed unsupported.  He required increased time to sit edge of bed pre transfer,    Transfers Overall  transfer level: Needs assistance Equipment used: Rolling walker (2 wheels) Transfers: Sit to/from Stand Sit to Stand: Contact guard assist, From elevated surface (Pt has difficulty from lower seated surface and adamant to raise bed height)           General transfer comment: Pt able to maintain weight bearing.  Poor placement of hands as he reaches and pulls on RW to rise into standing.    Ambulation/Gait Ambulation/Gait assistance: Contact guard assist Gait Distance (Feet): 10 Feet (x2 to and from bathroom.) Assistive device: Rolling walker (2 wheels) Gait Pattern/deviations: Step-to pattern, Trunk flexed, Knee flexed in stance - right (Knee flexed in stance and flexed at hip and trunk over RW when fatigued, however maintained weighht bearing well today.) Gait velocity: decreased     General Gait Details: reinforced importance to maintain NWB with functional mobility. gait distance self limited and patient is fatigued with activity. he was able to maintain NWB at all times with standing activity. mildly impulsive at times.   Stairs             Wheelchair Mobility     Tilt Bed    Modified Rankin (Stroke Patients Only)       Balance     Sitting balance-Leahy Scale: Good       Standing balance-Leahy Scale: Poor Standing balance comment: heavy reliance on rolling walker for support                            Communication Communication Communication: No apparent difficulties;Other (comment)  Cognition Arousal: Alert Behavior During Therapy: WFL for tasks assessed/performed   PT - Cognitive impairments: Safety/Judgement                       PT - Cognition Comments: some delayed processing noted. safety cues required with mobility Following commands: Impaired Following commands impaired: Follows one step commands with increased time, Only follows one step commands consistently    Cueing Cueing Techniques: Verbal cues, Visual cues   Exercises General Exercises - Lower Extremity Long Arc Quad: AROM, Right, 10 reps, Supine Hip Flexion/Marching: AROM, Right, 10 reps, Supine Other Exercises Other Exercises: Pillow squeeze 1x 10 reps with 3 sec hold Other Exercises: Chair push-ups with RLE 2 x5 reps.  Fatigues quickly with this activity. Other Exercises: HEP issued and encouraged to perform 3x/daily 1-2 sets x 10 reps.  Medbridge access code: [email or web address]    General Comments        Pertinent Vitals/Pain Pain Assessment Pain Assessment: 0-10 Pain Score: 4  Pain Location: R knee Pain Descriptors / Indicators: Pressure, Tightness (reports circulation) Pain Intervention(s): Monitored during session, Repositioned    Home Living                          Prior Function            PT Goals (current goals can now be found in the care plan section) Acute Rehab PT Goals Patient Stated Goal: to go to rehab in the [place] area Potential to Achieve Goals: Fair Progress towards PT goals: Progressing toward goals    Frequency    Min 2X/week      PT Plan      Co-evaluation              AM-PAC PT 6 Clicks Mobility   Outcome Measure  Help needed turning from your back to your side while in a flat bed without using bedrails?: None Help needed moving from lying on your back to sitting on the side of a flat bed without using bedrails?: A Little Help needed moving to and from a bed to a chair (including a wheelchair)?: A Little Help needed standing up from a chair using your arms (e.g., wheelchair or bedside chair)?: A Little Help needed to walk in hospital room?: A Lot Help needed climbing 3-5 steps with a railing? : A Lot 6 Click Score: 17    End of Session Equipment Utilized During Treatment: Gait belt Activity Tolerance: Patient limited by fatigue;Patient tolerated treatment well Patient left: in chair;with call bell/phone within reach   PT Visit Diagnosis: Difficulty in walking, not  elsewhere classified (R26.2);Muscle weakness (generalized) (M62.81)     Time: 0865-7846 PT Time Calculation (min) (ACUTE ONLY): 30 min  Charges:    $Therapeutic Exercise: 8-22 mins $Therapeutic Activity: 8-22 mins PT General Charges $$ ACUTE PT VISIT: 1 Visit                     Carl Conley , PTA Acute Rehabilitation Services Office [phone]    Annalaya Wile Winford Haus 04/20/2024, 12:16 PM

## 2024-04-20 NOTE — Progress Notes (Signed)
 Subjective:  Carl Conley is a 38 y.o. male with pertinent PMH of T2DM and left TMA amputation for chronic osteomyelitis who presented with postoperative wound dehiscence s/p surgical intervention of wound dehiscence.   Overnight events: Patient placed weight on his foot during transfers and surgical site dressing was saturated in blood. Podiatry was notified who held lovenox  and will see him today.   Today, he reports feeling down. He denies suicidal and homicidal ideation. He still has right knee pain but feels that the pain medication is helping his knee pain.   Objective:  Vital signs in last 24 hours: Vitals:   04/19/24 1523 04/19/24 2000 04/20/24 0347 04/20/24 0735  BP: 131/76 120/73 113/68 118/75  Pulse:  87 77 73  Resp: 19 18 16    Temp: 99.9 F (37.7 C) (!) 100.4 F (38 C) 98.5 F (36.9 C) 98.5 F (36.9 C)  TempSrc: Oral Oral Oral Oral  SpO2: 100% 99% 98% 99%  Weight:      Height:       Physical Exam: General: NAD, resting in bed  Cardiac:RRR, no murmur  Pulmonary:clear to auscultate bilaterally, normal effort on room air Neuro:awake, alert  MSK:R knee has a small effusion with tenderness on exam, left foot is wrapped in clean and dry dressing Skin:warm and dry Psych:  depressed mood, flat affect      Latest Ref Rng & Units 04/20/2024    6:14 AM 04/18/2024    6:37 AM 04/17/2024    6:14 AM  CBC  WBC 4.0 - 10.5 K/uL 10.6  11.1  11.3   Hemoglobin 13.0 - 17.0 g/dL 7.8  8.8  8.6   Hematocrit 39.0 - 52.0 % 22.8  26.0  24.7   Platelets 150 - 400 K/uL 208  230  228        Latest Ref Rng & Units 04/20/2024    6:14 AM 04/18/2024    6:37 AM 04/17/2024    6:14 AM  BMP  Glucose 70 - 99 mg/dL 191  478  295   BUN 6 - 20 mg/dL 34  34  34   Creatinine 0.61 - 1.24 mg/dL 6.21  3.08  6.57   Sodium 135 - 145 mmol/L 132  134  135   Potassium 3.5 - 5.1 mmol/L 4.3  4.6  4.3   Chloride 98 - 111 mmol/L 102  102  102   CO2 22 - 32 mmol/L 21  21  23    Calcium  8.9 - 10.3 mg/dL  8.8  9.1  8.9     Assessment/Plan:  Principal Problem:   Wound dehiscence Active Problems:   Diabetes (HCC)   History of transmetatarsal amputation of foot (HCC)   CKD (chronic kidney disease) stage 3, GFR 30-59 ml/min (HCC)   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 He did have a fever of 100.4 overnight but this resolved without tylenol   Plan: - Podiatry to reevaluated patients wound and there was no wound dehiscence   - Start ciprofloxacin for monotherapy, day 4/7 (based on start date of appropriate regimen)  - Obtain EKG for Qtc length prior to d/c  - Social work for SNF short-term rehab placement - Tylenol  650  mg Q6H PRN   Right knee Pain  Resolving with rest and pain medications. Exam reveals a small effusion and mild tenderness of medial joint line. Plan: -Continue to work with PT   -Lidocaine  patch -Oxycodone  5mg  q 8 hours prn   Chronic Stable Conditions   T2DM CKD 3, stable - Moderate SSI inpatient - GLP-1 discussion outpatient   History of depression - Resume home citalopram  20mg    History of herpes keratitis of right eye, stable - Continue home acyclovir  400 mg BID  Resolved Problems:  __________________________________  Code Status: Full VTE Prophylaxis: SCDs Diet:Carb modified  IVF:N/A Barriers to Discharge:SNF placement  Dispo: Anticipated discharge in approximately 1-2 day(s).   Aurora Lees, DO 04/20/2024, 10:56 AM Please Page [phone]

## 2024-04-20 NOTE — TOC Progression Note (Signed)
 Transition of Care [facility]) - Progression Note    Patient Details  Name: Carl Conley MRN: [id number] Date of Birth: 12-Mar-1986  [facility]) CM/SW Contact  Elspeth Hals, LCSW Phone Number: 04/20/2024, 1:20 PM  Clinical Narrative:   Message from [employer]  Auth request remains pending.      Expected Discharge Plan: Skilled Nursing Facility Barriers to Discharge: Continued Medical Work up, SNF Pending bed offer  Expected Discharge Plan and Services In-house Referral: Clinical Social Work   Post Acute Care Choice: Skilled Nursing Facility Living arrangements for the past 2 months: Single Family Home                                       Social Determinants of Health (SDOH) Interventions SDOH Screenings   Food Insecurity: Patient Declined (04/15/2024)  Housing: Patient Declined (04/15/2024)  Transportation Needs: Patient Declined (04/15/2024)  Utilities: Patient Declined (04/15/2024)  Depression (PHQ2-9): High Risk (03/06/2024)  Tobacco Use: Low Risk  (04/15/2024)    Readmission Risk Interventions    04/08/2024    3:51 PM  Readmission Risk Prevention Plan  Post Dischage Appt Complete  Medication Screening Complete  Transportation Screening Complete

## 2024-04-21 LAB — BASIC METABOLIC PANEL WITH GFR
Anion gap: 11 (ref 5–15)
BUN: 41 mg/dL — ABNORMAL HIGH (ref 6–20)
CO2: 19 mmol/L — ABNORMAL LOW (ref 22–32)
Calcium: 9.2 mg/dL (ref 8.9–10.3)
Chloride: 102 mmol/L (ref 98–111)
Creatinine, Ser: 2.34 mg/dL — ABNORMAL HIGH (ref 0.61–1.24)
GFR, Estimated: 36 mL/min — ABNORMAL LOW (ref >60)
Glucose, Bld: 164 mg/dL — ABNORMAL HIGH (ref 70–99)
Potassium: 4.3 mmol/L (ref 3.5–5.1)
Sodium: 132 mmol/L — ABNORMAL LOW (ref 135–145)

## 2024-04-21 LAB — CBC
HCT: 22.4 % — ABNORMAL LOW (ref 39.0–52.0)
Hemoglobin: 7.7 g/dL — ABNORMAL LOW (ref 13.0–17.0)
MCH: 28.2 pg (ref 26.0–34.0)
MCHC: 34.4 g/dL (ref 30.0–36.0)
MCV: 82.1 fL (ref 80.0–100.0)
Platelets: 199 K/uL (ref 150–400)
RBC: 2.73 MIL/uL — ABNORMAL LOW (ref 4.22–5.81)
RDW: 14 % (ref 11.5–15.5)
WBC: 9.5 K/uL (ref 4.0–10.5)
nRBC: 0 % (ref 0.0–0.2)

## 2024-04-21 LAB — GLUCOSE, CAPILLARY
Glucose-Capillary: 156 mg/dL — ABNORMAL HIGH (ref 70–99)
Glucose-Capillary: 139 mg/dL — ABNORMAL HIGH (ref 70–99)
Glucose-Capillary: 162 mg/dL — ABNORMAL HIGH (ref 70–99)

## 2024-04-21 MED ORDER — INSULIN GLARGINE 100 UNIT/ML SOLOSTAR PEN: 5.0000 [IU] | PEN_INJECTOR | Freq: Every day | SUBCUTANEOUS | Status: DC

## 2024-04-21 MED ORDER — CIPROFLOXACIN HCL 750 MG PO TABS: 750.0000 mg | ORAL_TABLET | Freq: Two times a day (BID) | ORAL | 0 refills | Status: DC

## 2024-04-21 MED ORDER — MELATONIN 5 MG PO TABS: 5.0000 mg | ORAL_TABLET | Freq: Every day | ORAL | Status: AC

## 2024-04-21 NOTE — Progress Notes (Signed)
 Physical Therapy Treatment Patient Details Name: Carl Conley MRN: [id number] DOB: 05/25/1986 Today's Date: 04/21/2024   History of Present Illness Pt is a 38 yo male who presented 6/10 from Podiatry office to 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 Pt now s/p I&D left foot hematoma with application of wound vac on 6/11. Of note - pt s/p Left TMA revision on 04/06/24 with NWB order for 2-3 weeks. 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    PT Comments  The patient continues to require skilled rehabilitation in a post-acute care setting due to ongoing physical limitations. He demonstrates significantly reduced strength and poor activity tolerance, which currently prevent safe and independent mobility. He is unable to negotiate stairs, a critical barrier to safely accessing his home environment. Although he reports no right knee pain at present, the knee remains visibly swollen, which may further impact his mobility and functional progress. Continued therapy in a post acute setting is essential to improve strength, endurance, and joint function in order to facilitate recovery before returning home.     If plan is discharge home, recommend the following: A little help with walking and/or transfers;A little help with bathing/dressing/bathroom;Help with stairs or ramp for entrance;Assistance with cooking/housework;Assist for transportation   Can travel by private vehicle     Yes  Equipment Recommendations  Wheelchair (measurements PT);Wheelchair cushion (measurements PT)    Recommendations for Other Services       Precautions / Restrictions Precautions Precautions: Fall Recall of Precautions/Restrictions: Impaired Restrictions Weight Bearing Restrictions Per Provider Order: Yes LLE Weight Bearing Per Provider Order: Non weight bearing Other Position/Activity Restrictions:  post-op shoe     Mobility  Bed Mobility Overal bed mobility: Needs Assistance, Modified Independent Bed Mobility: Supine to Sit     Supine to sit: Modified independent (Device/Increase time)          Transfers Overall transfer level: Needs assistance Equipment used: Rolling walker (2 wheels) Transfers: Sit to/from Stand Sit to Stand: Contact guard assist, From elevated surface (continues to reach for RW from high surface.  Would benefit from transfers from a lower seated surface to simulate home environmen and true assistance level.)           General transfer comment: Cues for hand placement to push from seated surface vs. pulling on RW.  He continues to maintain weight bearing well.    Ambulation/Gait Ambulation/Gait assistance: Contact guard assist, Min assist (required increased assistance as patient begant o fatigue with increased activity.) Gait Distance (Feet): 50 Feet (last 20 feet require min assistance and x 3 standing breaks d/t fatigue and weakness in RLE.) Assistive device: Rolling walker (2 wheels) Gait Pattern/deviations: Step-to pattern, Trunk flexed, Knee flexed in stance - right (Presents with decreased extension in R knee ( R knee presents with increased edema)) Gait velocity: decreased     General Gait Details: Cues for sequencing to keep RW still when advancing to hop to step.  He continues to benefit from pacing and rest breaks as he builds strength and endurance in RLE and B UEs.  Remains able to maintain NWB to LLE with post op shoe donned.   Stairs             Wheelchair Mobility     Tilt Bed    Modified Rankin (Stroke Patients Only)       Balance Overall balance assessment: Needs assistance  Sitting-balance support: Single extremity supported, No upper extremity supported, Feet supported Sitting balance-Leahy Scale: Good       Standing balance-Leahy Scale: Poor Standing balance comment: heavy reliance on rolling walker for  support                            Communication Communication Communication: No apparent difficulties;Other (comment)  Cognition Arousal: Alert Behavior During Therapy: WFL for tasks assessed/performed   PT - Cognitive impairments: Safety/Judgement                       PT - Cognition Comments: some delayed processing noted. safety cues required with mobility Following commands: Impaired Following commands impaired: Follows one step commands with increased time, Only follows one step commands consistently    Cueing Cueing Techniques: Verbal cues, Visual cues  Exercises General Exercises - Lower Extremity Ankle Circles/Pumps: AROM, Both, 10 reps, Supine Quad Sets: AROM, Both, 10 reps, Supine Hip ABduction/ADduction: AROM, Both, 10 reps, Supine Straight Leg Raises: AROM, Both, 10 reps, Supine, Limitations (B extensor lag)    General Comments        Pertinent Vitals/Pain Pain Assessment Pain Assessment: No/denies pain    Home Living                          Prior Function            PT Goals (current goals can now be found in the care plan section) Acute Rehab PT Goals Patient Stated Goal: to go to rehab in the [place] area Potential to Achieve Goals: Fair Progress towards PT goals: Progressing toward goals    Frequency    Min 2X/week      PT Plan      Co-evaluation              AM-PAC PT 6 Clicks Mobility   Outcome Measure  Help needed turning from your back to your side while in a flat bed without using bedrails?: None Help needed moving from lying on your back to sitting on the side of a flat bed without using bedrails?: None Help needed moving to and from a bed to a chair (including a wheelchair)?: A Little Help needed standing up from a chair using your arms (e.g., wheelchair or bedside chair)?: A Little Help needed to walk in hospital room?: A Lot Help needed climbing 3-5 steps with a railing? : Total 6  Click Score: 17    End of Session Equipment Utilized During Treatment: Gait belt Activity Tolerance: Patient limited by fatigue;Patient tolerated treatment well Patient left: in bed;with call bell/phone within reach;with bed alarm set   PT Visit Diagnosis: Difficulty in walking, not elsewhere classified (R26.2);Muscle weakness (generalized) (M62.81)     Time: 6962-9528 PT Time Calculation (min) (ACUTE ONLY): 18 min  Charges:    $Gait Training: 8-22 mins                      Beulah Brunt , PTA Acute Rehabilitation Services Office [phone]    Reynolds Cea 04/21/2024, 10:04 AM

## 2024-04-21 NOTE — Progress Notes (Signed)
 Discharge summary packet provided to pt with instructions. Pt verbalized understanding of instructions. No complaints. Pt d/c to [facility] as ordred, Pt's father is responsible for his transportation.

## 2024-04-21 NOTE — Discharge Summary (Signed)
 Name: Carl Conley MRN: [id number] DOB: [birth year] 38 y.o. PCP: Nooruddin, Saad, MD  Date of Admission: 04/14/2024  6:15 PM Date of Discharge: 04/21/2024 10:33 AM Attending Physician: Dr. 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   Accu-Chek Guide w/Device Kit Use to check blood sugar in the morning, at noon, and at bedtime.   Accu-Chek Softclix Lancets lancets Use to check blood sugar in the morning, at noon, and at bedtime.   acetaminophen  325 MG tablet Commonly known as: TYLENOL  Take 2 tablets (650 mg total) by mouth every 6 (six) hours as needed for moderate pain (pain score 4-6).   acyclovir  400 MG tablet Commonly known as: ZOVIRAX  Take 1 tablet (400 mg total) by mouth 2 (two) times daily.   ciprofloxacin 750 MG tablet Commonly known as: CIPRO Take 1 tablet (750 mg total) by mouth 2 (two) times daily.   citalopram  20 MG tablet Commonly known as: CELEXA  Take 1 tablet (20 mg total) by mouth daily. What changed: when to take this   insulin  glargine 100 UNIT/ML Solostar Pen Commonly known as: LANTUS  Inject 5 Units into the skin daily. What changed: how much to take   ketoconazole  2 % shampoo Commonly known as: NIZORAL  APPLY 1 APPLICATION TOPICALLY 2 (TWO) TIMES A WEEK.    Lancet Device Misc Use as directed to check blood sugars in the morning, noon and bedtime.   melatonin 5 MG Tabs Take 1 tablet (5 mg total) by mouth at bedtime.   rosuvastatin  20 MG tablet Commonly known as: CRESTOR  Take 1 tablet (20 mg total) by mouth daily. What changed: when to take this   TechLite Plus Pen Needles 32G X 4 MM Misc Generic drug: Insulin  Pen Needle Use to inject daily.               Durable Medical Equipment  (From admission, onward)           Start     Ordered   04/21/24 1018  DME standard manual wheelchair with seat cushion  Once       Comments: Patient suffers from osteomyelitis  which impairs their ability to perform daily activities like toileting in the home.  A walker will not resolve issue with performing activities of daily living. A wheelchair will allow patient to safely perform daily activities. Patient can safely propel the wheelchair in the home or has a caregiver who can provide assistance. Length of need 6 months . Accessories: elevating leg rests (ELRs), wheel locks, extensions and anti-tippers.   04/21/24 1018              Discharge Care Instructions  (From admission, onward)  Start     Ordered   04/21/24 0000  Discharge wound care:       Comments: Remove dressing to left foot Cleanse the incision with betadine paint Apply betadine pain small amount to 4x4 gauze and apply to incision line Cover with dry gauze 4x4 ,abd, wrap with kerlix roll and ace wrap under some compression. Change every other day   04/21/24 1018            Disposition and follow-up:   Carl Conley was discharged from [facility] in Good condition.  At the hospital follow up visit please address:  1.  Follow-up:  *Wound Dehiscence s/p I&D with wound vac placement of the revision of Left TMA  -Note that patient was discharged with the remaining one week course of ciprofloxacin, podiatry is ok with one week  ABX  -Ensure completion of ABX therapy  -Ensure podiatry follow up   *T2DM -Patient was d/c home with insulin  glargine 5 units daily, assess if he is interested in GLP1 therapy. -Metformin  was stopped during last admission due to renal function  -Repeat BMP  *Anemia  -Patient was anemic with stable hemoglobin ranging from 7-9 during admission -Repeat CBC  2.  Labs / imaging needed at time of follow-up: CBC, BMP  3.  Pending labs/ test needing follow-up: N/A  4.  Medication Changes  STOPPED  -Linezolid     ADDED  -Insulin  glargine 5 units daily  -Ciprofloxacin 750mg  BID    Follow-up Appointments:  Contact information for after-discharge care     Destination     [facility] .   Service: Skilled Nursing Contact information: [address] [place]  [address] [phone]                     Hospital Course by problem list: Carl Conley is a 38 y.o.male with a history of recent left TMA revision for chronic osteomyelitis, T2DM, and CKD stage 3a who was admitted to the Internal Teaching Service at [facility] for wound dehiscence and revision. His hospital course is detailed below:  Wound dehiscence of left TMA s/p revision  On 04/06/2024, patient underwent TMA revision of L foot for chronic osteomyelitis and completed a week of oral antibiotic therapy. Wound dehisced, prompting direct admission for podiatry revision. On 04/15/2024, podiatry closed with amniotic graft and disposable incisional wound VAC. On 04/17/2024, wound cultures grew abundant klebsiella pneumoniae and acinetobacter calcoaceticus/baumannii complex, prompting transition from Augmentin  and linezolid  to Unasyn and linezolid , the antibiotic therapy was ultimately transitioned to ciprofloxacin 750 mg BID. An EKG was obtained which showed a Qtc of . Patient was discharged to SNF for rehab.   Right knee Pain  Resolved with rest and pain  medications.   T2DM CKD 3a Moderate SSI inpatient. Kidney function at baseline (Cr ~2.2) throughout his hospitalization. He was discharged home with insulin  glargine 5 units daily. Metformin  was stopped due to renal function. Recommend GLP-1 at outpatient follow-up.  History of herpes keratitis of right eye, stable Continued home acyclovir  400 mg BID.     Discharge Subjective: Patient feels well today. He denies any complaints at this time and is looking forward to being discharged.   Discharge Exam:   Blood pressure 117/72, pulse 68, temperature 98.4 F (36.9 C), temperature source Oral, resp. rate 18, height 5' 11 (1.803 m), weight 118.6 kg, SpO2 98%.  Constitutional:Well-appearing, sitting at the edge of the bed eating  breakfast  Pulmonary/Chest: normal work of breathing on room air Neurological: alert & participating in conversation  MSK: Left foot is wrapped in clean/dry dressing with an orthopedic show in place  Psych: Normal mood and flat affect  Pertinent Labs, Studies, and Procedures:     Latest Ref Rng & Units 04/21/2024    6:16 AM 04/20/2024    6:14 AM 04/18/2024    6:37 AM  CBC  WBC 4.0 - 10.5 K/uL 9.5  10.6  11.1   Hemoglobin 13.0 - 17.0 g/dL 7.7  7.8  8.8   Hematocrit 39.0 - 52.0 % 22.4  22.8  26.0   Platelets 150 - 400 K/uL 199  208  230        Latest Ref Rng & Units 04/21/2024    6:16 AM 04/20/2024    6:14 AM 04/18/2024    6:37 AM  CMP  Glucose 70 - 99 mg/dL 259  563  875   BUN 6 - 20 mg/dL 41  34  34   Creatinine 0.61 - 1.24 mg/dL 6.43  3.29  5.18   Sodium 135 - 145 mmol/L 132  132  134   Potassium 3.5 - 5.1 mmol/L 4.3  4.3  4.6   Chloride 98 - 111 mmol/L 102  102  102   CO2 22 - 32 mmol/L 19  21  21    Calcium  8.9 - 10.3 mg/dL 9.2  8.8  9.1      Discharge Instructions: Discharge Instructions     (HEART FAILURE PATIENTS) Call MD:  Anytime you have any of the following symptoms: 1) 3 pound weight gain in 24 hours or 5 pounds in 1 week 2) shortness of  breath, with or without a dry hacking cough 3) swelling in the hands, feet or stomach 4) if you have to sleep on extra pillows at night in order to breathe.   Complete by: As directed    Call MD for:  difficulty breathing, headache or visual disturbances   Complete by: As directed    Call MD for:  extreme fatigue   Complete by: As directed    Call MD for:  hives   Complete by: As directed    Call MD for:  persistant dizziness or light-headedness   Complete by: As directed    Call MD for:  persistant nausea and vomiting   Complete by: As directed    Call MD for:  redness, tenderness, or signs of infection (pain, swelling, redness, odor or green/yellow discharge around incision site)   Complete by: As directed    Call MD for:  severe uncontrolled pain   Complete by: As directed    Call MD for:  temperature >100.4   Complete by: As directed    Diet - low sodium heart healthy   Complete by: As directed    Discharge wound care:   Complete by: As directed    Remove dressing to left foot Cleanse the incision with betadine paint Apply betadine pain small amount to 4x4 gauze and apply to incision line Cover with dry gauze 4x4 ,abd, wrap with kerlix roll and ace wrap under some compression. Change every other day   Increase activity slowly   Complete by: As directed        Signed: Aurora Lees DO [facility] - PGY1 04/21/2024, 10:33 AM    Please contact the pager at [phone].

## 2024-04-21 NOTE — TOC Transition Note (Signed)
 Transition of Care [facility]) - Discharge Note   Patient Details  Name: Carl Conley MRN: [id number] Date of Birth: Nov 08, 1985  Transition of Care [facility]) CM/SW Contact:  Elspeth Hals, LCSW Phone Number: 04/21/2024, 2:11 PM   Clinical Narrative:   Pt discharging to [facility].  RN call report to [phone].  Pt father will transport pt, will not available until 6pm.  Pt will need to be brought down to main north tower entrance with assistance getting into the vehicle.      Final next level of care: Skilled Nursing Facility Barriers to Discharge: Barriers Resolved   Patient Goals and CMS Choice Patient states their goals for this hospitalization and ongoing recovery are:: walking          Discharge Placement              Patient chooses bed at:  [facility]) Patient to be transferred to facility by: father Hilarie Lovely Name of family member notified: pt has spoken to father, CSW unable to reach father by phone    Discharge Plan and Services Additional resources added to the After Visit Summary for   In-house Referral: Clinical Social Work   Post Acute Care Choice: Skilled Nursing Facility                               Social Drivers of Health (SDOH) Interventions SDOH Screenings   Food Insecurity: Patient Declined (04/15/2024)  Housing: Patient Declined (04/15/2024)  Transportation Needs: Patient Declined (04/15/2024)  Utilities: Patient Declined (04/15/2024)  Depression (PHQ2-9): High Risk (03/06/2024)  Tobacco Use: Low Risk  (04/15/2024)     Readmission Risk Interventions    04/08/2024    3:51 PM  Readmission Risk Prevention Plan  Post Dischage Appt Complete  Medication Screening Complete  Transportation Screening Complete

## 2024-04-21 NOTE — TOC Progression Note (Addendum)
 Transition of Care [facility]) - Progression Note    Patient Details  Name: Carl Conley MRN: [id number] Date of Birth: [birth year]  Transition [facility]) CM/SW Contact  Elspeth Hals, LCSW Phone Number: 04/21/2024, 8:43 AM  Clinical Narrative:   Message from [facility]: SNF auth approved.  Need DC summary by 3pm for admit today.  MD team informed.   1030: Per PT, pt does not need ambulance transport.  CSW spoke with pt regarding transportation.  He will call his father.    1130: Pt father can come at 6pm.  CSW message with [employer] and they are OK with pt arriving after 6pm.      Expected Discharge Plan: Skilled Nursing Facility Barriers to Discharge: Continued Medical Work up, SNF Pending bed offer  Expected Discharge Plan and Services In-house Referral: Clinical Social Work   Post Acute Care Choice: Skilled Nursing Facility Living arrangements for the past 2 months: Single Family Home                                       Social Determinants of Health (SDOH) Interventions SDOH Screenings   Food Insecurity: Patient Declined (04/15/2024)  Housing: Patient Declined (04/15/2024)  Transportation Needs: Patient Declined (04/15/2024)  Utilities: Patient Declined (04/15/2024)  Depression (PHQ2-9): High Risk (03/06/2024)  Tobacco Use: Low Risk  (04/15/2024)    Readmission Risk Interventions    04/08/2024    3:51 PM  Readmission Risk Prevention Plan  Post Dischage Appt Complete  Medication Screening Complete  Transportation Screening Complete

## 2024-04-21 NOTE — Progress Notes (Signed)
 Report given to staff at  [facility] [place]. All questions and concerns were fully answered.

## 2024-04-21 NOTE — Plan of Care (Signed)
  Problem: Education: Goal: Knowledge of General Education information will improve Description: Including pain rating scale, medication(s)/side effects and non-pharmacologic comfort measures Outcome: Adequate for Discharge   Problem: Activity: Goal: Risk for activity intolerance will decrease Outcome: Adequate for Discharge   Problem: Pain Managment: Goal: General experience of comfort will improve and/or be controlled Outcome: Adequate for Discharge

## 2024-04-28 ENCOUNTER — Ambulatory Visit (INDEPENDENT_AMBULATORY_CARE_PROVIDER_SITE_OTHER): Admitting: Podiatry

## 2024-04-28 DIAGNOSIS — Z89432 Acquired absence of left foot: Secondary | ICD-10-CM

## 2024-04-28 DIAGNOSIS — M86672 Other chronic osteomyelitis, left ankle and foot: Secondary | ICD-10-CM

## 2024-04-28 DIAGNOSIS — Z9889 Other specified postprocedural states: Secondary | ICD-10-CM

## 2024-04-28 NOTE — Progress Notes (Signed)
  Subjective:  Patient ID: KNUT RONDINELLI, male    DOB: 1986-03-26,  MRN: [id number]  DOS: 04/15/24 Procedure: 1.  Left foot irrigation and debridement of hematoma 2.  Application amniotic graft bio Vance single-layer 5 x 5 cm 3.  Complex wound closure 6 x 2 cm, left foot 4.  Application disposable wound VAC therapy 6 x 1 cm, left foot  38 y.o. male seen for post op check.  Patient coming in for postop appointment from rehab facility where he has been.  He has been trying to keep weight off the left foot is basically and using postop shoe and wheelchair.  Has been getting dressing changed per my orders at the rehab facility.  Review of Systems: Negative except as noted in the HPI. Denies N/V/F/Ch.   Objective:   Constitutional Well developed. Well nourished.  Vascular Foot warm and well perfused. Capillary refill normal to all digits.   No calf pain with palpation  Neurologic Normal speech. Oriented to person, place, and time. Epicritic sensation diminished to foot  Dermatologic Left foot Lisfranc amputation site significantly improved from prior.  Much drier with no maceration along the incision lines.  No active bleeding or drainage.  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  POD # 5 s/p above procedures -Progressing as expected post op, much improved from prior with dressing care.  Recommend continue dressing changes twice weekly - Continue 2X weekly Betadine wet-to-dry dressing to the left foot amputation site under some  compression.  -XR: expected post op changes -WB Status: Strict NWB in post op shoe to LLE -Sutures: remain intact 1-2 more weeks -Medications/ABX: Status post course of postop antibiotics -Dressing: As above continue 2X weekly dressing changes at rehab facility - F/u Plan: Follow-up in 1 week for recheck        Marolyn JULIANNA Honour, DPM [facility]

## 2024-04-30 ENCOUNTER — Telehealth: Payer: Self-pay

## 2024-04-30 ENCOUNTER — Ambulatory Visit: Admitting: Student

## 2024-04-30 VITALS — BP 103/67 | HR 59 | Temp 98.9°F | Ht 71.0 in | Wt 252.4 lb

## 2024-04-30 DIAGNOSIS — Z794 Long term (current) use of insulin: Secondary | ICD-10-CM

## 2024-04-30 DIAGNOSIS — Z7985 Long-term (current) use of injectable non-insulin antidiabetic drugs: Secondary | ICD-10-CM

## 2024-04-30 DIAGNOSIS — N1832 Chronic kidney disease, stage 3b: Secondary | ICD-10-CM | POA: Diagnosis not present

## 2024-04-30 DIAGNOSIS — E11621 Type 2 diabetes mellitus with foot ulcer: Secondary | ICD-10-CM | POA: Diagnosis not present

## 2024-04-30 DIAGNOSIS — E1122 Type 2 diabetes mellitus with diabetic chronic kidney disease: Secondary | ICD-10-CM

## 2024-04-30 DIAGNOSIS — Z7984 Long term (current) use of oral hypoglycemic drugs: Secondary | ICD-10-CM

## 2024-04-30 DIAGNOSIS — E1159 Type 2 diabetes mellitus with other circulatory complications: Secondary | ICD-10-CM

## 2024-04-30 DIAGNOSIS — L97521 Non-pressure chronic ulcer of other part of left foot limited to breakdown of skin: Secondary | ICD-10-CM

## 2024-04-30 DIAGNOSIS — D500 Iron deficiency anemia secondary to blood loss (chronic): Secondary | ICD-10-CM | POA: Diagnosis not present

## 2024-04-30 DIAGNOSIS — Z89439 Acquired absence of unspecified foot: Secondary | ICD-10-CM

## 2024-04-30 MED ORDER — INSULIN GLARGINE 100 UNIT/ML SOLOSTAR PEN
10.0000 [IU] | PEN_INJECTOR | Freq: Every day | SUBCUTANEOUS | Status: DC
Start: 2024-04-30 — End: 2024-06-01

## 2024-04-30 MED ORDER — FERROUS SULFATE 325 (65 FE) MG PO TABS: 325.0000 mg | ORAL_TABLET | ORAL | Status: AC

## 2024-04-30 MED ORDER — EMPAGLIFLOZIN 10 MG PO TABS: 10.0000 mg | ORAL_TABLET | Freq: Every day | ORAL | 3 refills | Status: AC

## 2024-04-30 MED ORDER — OZEMPIC (0.25 OR 0.5 MG/DOSE) 2 MG/3ML ~~LOC~~ SOPN: PEN_INJECTOR | SUBCUTANEOUS | 1 refills | Status: AC

## 2024-04-30 MED ORDER — SEMAGLUTIDE (1 MG/DOSE) 4 MG/3ML ~~LOC~~ SOPN
1.0000 mg | PEN_INJECTOR | SUBCUTANEOUS | 1 refills | Status: AC
Start: 2024-06-25 — End: ?

## 2024-04-30 NOTE — Telephone Encounter (Signed)
 Carl Conley (Key: [id number]) Rx #: [id number] [id number] Ozempic (0.25 or 0.5 MG/DOSE) 2MG [name] pen-injectors Form [employer] Medicaid Electronic Prior Authorization Form (2017 NCPDP) Created Sent to Plan Plan Response Submit Clinical Questions Determination Favorable Message from Plan Request Reference Number: [id number]. OZEMPIC INJ 2MG /3ML is approved through 04/30/2025. For further questions, call [employer] at [phone].Carl Conley Authorization Expiration Date: April 30, 2025.

## 2024-04-30 NOTE — Telephone Encounter (Signed)
 Prior Authorization for patient (Ozempic (0.25 or 0.5 MG/DOSE) 2MG /3ML pen-injectors) came through on cover my meds was submitted with last office notes and labs awaiting approval or denial.  KEY: [id number]

## 2024-04-30 NOTE — Patient Instructions (Signed)
 Return in about 1 month (around 05/30/2024) for diabetes and diabetic foot wound.  Remember to bring all of the medications that you take (including over the counter medications and supplements) with you to every clinic visit.  This after visit summary is an important review of tests, referrals, and medication changes that were discussed during your visit. If you have questions or concerns, call [phone]. Outside of clinic business hours, call the main hospital at [phone] and ask the operator for the on-call internal medicine resident.   Ozell Kung MD 04/30/2024, 2:18 PM

## 2024-04-30 NOTE — Progress Notes (Signed)
 Patient name: Carl Conley Date of birth: 07/31/1986 Date of visit: 04/30/24  Subjective   Chief concern: I wish my foot was better, I wish I was weightbearing.  Doing okay with rehab. Avoiding putting weight on his foot.  Follow up Hospitalization  Patient was admitted to [facility] hospital on April 14 2024 and discharged on April 21 2024. He was treated for osteomyelitis. Treatment for this included surgery and antibiotics. Telephone follow up was not done. He reports excellent compliance with treatment. He reports this condition is improved.  Review of Systems  Constitutional:  Negative for chills, fever, malaise/fatigue and weight loss.  Respiratory:  Negative for cough and shortness of breath.   Gastrointestinal:  Positive for vomiting. Negative for abdominal pain, constipation and diarrhea.  Musculoskeletal:  Negative for falls.       Foot doesn't hurt.  Psychiatric/Behavioral:  Negative for depression. The patient does not have insomnia.    Regarding vomiting he thinks food at the rehab center disagrees with his stomach, particularly the powdered eggs.  Current Outpatient Medications  Medication Instructions   Accu-Chek Softclix Lancets lancets Use to check blood sugar in the morning, at noon, and at bedtime.   acetaminophen  (TYLENOL ) 650 mg, Oral, Every 6 hours PRN   acyclovir  (ZOVIRAX ) 400 mg, Oral, 2 times daily   Blood Glucose Monitoring Suppl (BLOOD GLUCOSE MONITOR SYSTEM) w/Device KIT Use to check blood sugar in the morning, at noon, and at bedtime.   ciprofloxacin  (CIPRO ) 750 mg, Oral, 2 times daily   citalopram  (CELEXA ) 20 mg, Oral, Daily   Glucose Blood (BLOOD GLUCOSE TEST STRIPS) STRP Use to check sugars in the morning, at noon, and at bedtime.   insulin  glargine (LANTUS ) 5 Units, Subcutaneous, Daily   Insulin  Pen Needle (PEN NEEDLES) 32G X 4 MM MISC Use to inject daily.   ketoconazole  (NIZORAL ) 2 % shampoo 1 Application, Topical, 2 times weekly   Lancet  Device MISC Use as directed to check blood sugars in the morning, noon and bedtime.   melatonin 5 mg, Oral, Daily at bedtime   rosuvastatin  (CRESTOR ) 20 mg, Oral, Daily     Objective  Today's Vitals   04/30/24 1330 04/30/24 1331  BP: 99/66 103/67  Pulse: (!) 57 (!) 59  Temp: 98.9 F (37.2 C)   TempSrc: Oral   SpO2: 98%   Weight: 252 lb 6.4 oz (114.5 kg)   Height: 5' 11 (1.803 m)   Body mass index is 35.2 kg/m.   Physical Exam Constitutional:      General: He is not in acute distress.    Appearance: Normal appearance.  Neck:     Thyroid: No thyroid mass, thyromegaly or thyroid tenderness.   Cardiovascular:     Rate and Rhythm: Normal rate and regular rhythm.     Pulses: Normal pulses.     Heart sounds: No murmur heard. Pulmonary:     Effort: Pulmonary effort is normal.     Breath sounds: Normal breath sounds. No wheezing or rales.   Musculoskeletal:     Right lower leg: No edema.     Left lower leg: No edema.     Comments: Dressing on left foot taken down, wound looks clean, skin looks healthy, sutures intact.  Dressing replaced with betadine soaked gauze per Dr. Emmitt recommendations.   Skin:    General: Skin is warm and dry.   Neurological:     Mental Status: He is alert. Mental status is at baseline.  Cranial Nerves: No facial asymmetry.     Motor: No tremor.   Psychiatric:        Mood and Affect: Mood normal.        Behavior: Behavior normal.      Assessment & Plan  Problem List Items Addressed This Visit     Diabetes (HCC)   Stable, complicated by CKD 3B and diabetic osteomyelitis.  Start Ozempic 0.25 mg weekly to increase every 4 weeks.  Start Jardiance  10 mg daily.  Ophthalmology referral pending.      Relevant Medications   insulin  glargine (LANTUS ) 100 UNIT/ML Solostar Pen   Semaglutide,0.25 or 0.5MG /DOS, (OZEMPIC, 0.25 OR 0.5 MG/DOSE,) 2 MG/3ML SOPN   Semaglutide, 1 MG/DOSE, 4 MG/3ML SOPN (Start on 06/25/2024)   empagliflozin   (JARDIANCE ) 10 MG TABS tablet   History of transmetatarsal amputation of foot (HCC)   Following with Dr. Malvin of podiatry.      Anemia   Multifactorial with iron deficiency contributing.  Start iron supplementation every other day.      Relevant Medications   ferrous sulfate 325 (65 FE) MG tablet   Other Relevant Orders   CBC no Diff   Diabetic ulcer of Left foot, limited to breakdown of skin (HCC) - Primary   Wound looks okay, skin looks healthy.  Seems to be healing well.  Sutures are intact.  Replace dressing with Betadine soaked gauze per Dr. Emmitt recommendations.      Relevant Medications   insulin  glargine (LANTUS ) 100 UNIT/ML Solostar Pen   Semaglutide,0.25 or 0.5MG /DOS, (OZEMPIC, 0.25 OR 0.5 MG/DOSE,) 2 MG/3ML SOPN   Semaglutide, 1 MG/DOSE, 4 MG/3ML SOPN (Start on 06/25/2024)   empagliflozin  (JARDIANCE ) 10 MG TABS tablet   CKD (chronic kidney disease) stage 3, GFR 30-59 ml/min (HCC)   No proteinuria at last check.  Start Jardiance  10 mg daily.      Relevant Orders   BMP8+Anion Gap   Return in about 1 month (around 05/30/2024) for diabetes and diabetic foot wound.  Ozell Kung MD 04/30/2024, 1:38 PM

## 2024-04-30 NOTE — Assessment & Plan Note (Signed)
 No proteinuria at last check.  Start Jardiance  10 mg daily.

## 2024-04-30 NOTE — Assessment & Plan Note (Signed)
 Following with Dr. Malvin of podiatry.

## 2024-04-30 NOTE — Assessment & Plan Note (Signed)
 Wound looks okay, skin looks healthy.  Seems to be healing well.  Sutures are intact.  Replace dressing with Betadine soaked gauze per Dr. Emmitt recommendations.

## 2024-04-30 NOTE — Assessment & Plan Note (Addendum)
 Stable, complicated by CKD 3B and diabetic osteomyelitis.  Start Ozempic 0.25 mg weekly to increase every 4 weeks.  Start Jardiance  10 mg daily.  Ophthalmology referral pending.

## 2024-04-30 NOTE — Telephone Encounter (Signed)
 Carl Conley (Key: [id number]) Rx #: [id number] Ozempic (1 MG/DOSE) 4MG [name] pen-injectors Form [employer] Medicaid Electronic Prior Authorization Form (2017 NCPDP) Created Sent to Plan Plan Response Submit Clinical Questions Determination Message from Plan We received a prior authorization request for the member and product listed above. The [facility] Prior Authorization Team is not able to review this request because the requested medication has been previously approved under [id number]. Based on the information reviewed, the requested prescription is currently authorized for coverage by the plan until 2025-04-30. Please resubmit this request within 30 days of authorization expiration date.

## 2024-04-30 NOTE — Assessment & Plan Note (Signed)
 Multifactorial with iron deficiency contributing.  Start iron supplementation every other day.

## 2024-04-30 NOTE — Telephone Encounter (Signed)
 Prior Authorization for patient (Ozempic (1 MG/DOSE) 4MG /3ML pen-injectors) came through on cover my meds was submitted with last office notes and labs awaiting approval or denial.  [id number]

## 2024-05-01 ENCOUNTER — Ambulatory Visit: Payer: Self-pay | Admitting: Student

## 2024-05-01 ENCOUNTER — Telehealth: Payer: Self-pay

## 2024-05-01 LAB — BMP8+ANION GAP
Anion Gap: 19 mmol/L — ABNORMAL HIGH (ref 10.0–18.0)
BUN/Creatinine Ratio: 9 (ref 9–20)
BUN: 18 mg/dL (ref 6–20)
CO2: 17 mmol/L — ABNORMAL LOW (ref 20–29)
Calcium: 9.5 mg/dL (ref 8.7–10.2)
Chloride: 97 mmol/L (ref 96–106)
Creatinine, Ser: 1.98 mg/dL — ABNORMAL HIGH (ref 0.76–1.27)
Glucose: 93 mg/dL (ref 70–99)
Potassium: 4.8 mmol/L (ref 3.5–5.2)
Sodium: 133 mmol/L — ABNORMAL LOW (ref 134–144)
eGFR: 44 mL/min/{1.73_m2} — ABNORMAL LOW (ref >59)

## 2024-05-01 LAB — CBC
Hematocrit: 28.1 % — ABNORMAL LOW (ref 37.5–51.0)
Hemoglobin: 9 g/dL — ABNORMAL LOW (ref 13.0–17.7)
MCH: 27.4 pg (ref 26.6–33.0)
MCHC: 32 g/dL (ref 31.5–35.7)
MCV: 86 fL (ref 79–97)
Platelets: 333 10*3/uL (ref 150–450)
RBC: 3.28 x10E6/uL — ABNORMAL LOW (ref 4.14–5.80)
RDW: 14.5 % (ref 11.6–15.4)
WBC: 9.8 10*3/uL (ref 3.4–10.8)

## 2024-05-01 NOTE — Telephone Encounter (Signed)
 Prior Authorization for patient (Jardiance  10MG  tablets) came through on cover my meds was submitted with last office notes and labs awaiting approval or denial.  KEY:[id number]

## 2024-05-01 NOTE — Telephone Encounter (Signed)
 Carl Conley (Key: [id number]) PA Case ID #: [id number] Rx #: [id number] Need Help? Call us  at [phone] Outcome Approved today by [facility] 2017 NCPDP Request Reference Number: [id number]. JARDIANCE  TAB 10MG  is approved through 05/01/2025. For further questions, call [employer] at [phone]. Effective Date: 05/01/2024 Authorization Expiration Date: 05/01/2025 Drug Jardiance  10MG  tablets ePA cloud logo Form [employer] Electronic Prior Authorization Form [phone] NCPDP) Original Claim Info 57

## 2024-05-04 NOTE — Progress Notes (Signed)
 Internal Medicine Clinic Attending  Case discussed with the resident at the time of the visit.  We reviewed the resident's history and exam and pertinent patient test results.  I agree with the assessment, diagnosis, and plan of care documented in the resident's note.

## 2024-05-05 ENCOUNTER — Ambulatory Visit (INDEPENDENT_AMBULATORY_CARE_PROVIDER_SITE_OTHER): Admitting: Podiatry

## 2024-05-05 DIAGNOSIS — M86672 Other chronic osteomyelitis, left ankle and foot: Secondary | ICD-10-CM

## 2024-05-05 DIAGNOSIS — Z89432 Acquired absence of left foot: Secondary | ICD-10-CM

## 2024-05-05 DIAGNOSIS — Z9889 Other specified postprocedural states: Secondary | ICD-10-CM

## 2024-05-05 NOTE — Progress Notes (Signed)
  Subjective:  Patient ID: Carl Conley, male    DOB: 1986/04/07,  MRN: [id number]  DOS: 04/15/24 Procedure: 1.  Left foot irrigation and debridement of hematoma 2.  Application amniotic graft bio Vance single-layer 5 x 5 cm 3.  Complex wound closure 6 x 2 cm, left foot 4.  Application disposable wound VAC therapy 6 x 1 cm, left foot  38 y.o. male seen for post op check.  Patient now approximately 3 weeks out from procedure.  He is doing well and thinks that the surgical site is improving.  They have been doing wound care 3 times weekly with Dressing change at the rehab facility.  He is still nonweightbearing in postop shoe using wheelchair to get around.  Review of Systems: Negative except as noted in the HPI. Denies N/V/F/Ch.   Objective:   Constitutional Well developed. Well nourished.  Vascular Foot warm and well perfused. Capillary refill normal to all digits.   No calf pain with palpation  Neurologic Normal speech. Oriented to person, place, and time. Epicritic sensation diminished to foot  Dermatologic Left foot Lisfranc amputation site significantly improved from prior.  Much drier with no maceration along the incision lines.  No active bleeding or drainage.  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  2 weeks s/p above procedures -Progressing as expected post op, much improved from prior with dressing care.  Recommend continue dressing changes  twice to 3 times weekly - Continue 3X weekly Betadine wet-to-dry  dressing to the left foot amputation site under some compression.  -XR: expected post op changes -WB Status: Okay to be heel weightbearing in postop shoe to the left lower extremity at this time -Sutures: remain intact until next appointment when they will be removed -Medications/ABX: Status post course of postop antibiotics -Dressing: As above continue 2X weekly dressing changes at rehab facility - F/u Plan: Follow-up in 1 week for recheck        Marolyn JULIANNA Honour, DPM [facility] / [facility]

## 2024-05-12 ENCOUNTER — Encounter: Payer: Self-pay | Admitting: Podiatry

## 2024-05-12 ENCOUNTER — Ambulatory Visit (INDEPENDENT_AMBULATORY_CARE_PROVIDER_SITE_OTHER): Admitting: Podiatry

## 2024-05-12 ENCOUNTER — Encounter (HOSPITAL_COMMUNITY): Payer: Self-pay

## 2024-05-12 ENCOUNTER — Emergency Department (HOSPITAL_COMMUNITY)
Admission: EM | Admit: 2024-05-12 | Discharge: 2024-05-13 | Disposition: A | Discharge status: Home / Self Care | Attending: Emergency Medicine | Admitting: Emergency Medicine

## 2024-05-12 ENCOUNTER — Other Ambulatory Visit: Payer: Self-pay

## 2024-05-12 DIAGNOSIS — N1832 Chronic kidney disease, stage 3b: Secondary | ICD-10-CM | POA: Insufficient documentation

## 2024-05-12 DIAGNOSIS — Z794 Long term (current) use of insulin: Secondary | ICD-10-CM | POA: Insufficient documentation

## 2024-05-12 DIAGNOSIS — D649 Anemia, unspecified: Secondary | ICD-10-CM | POA: Diagnosis not present

## 2024-05-12 DIAGNOSIS — T148XXA Other injury of unspecified body region, initial encounter: Secondary | ICD-10-CM

## 2024-05-12 DIAGNOSIS — E1165 Type 2 diabetes mellitus with hyperglycemia: Secondary | ICD-10-CM | POA: Diagnosis not present

## 2024-05-12 DIAGNOSIS — Z89432 Acquired absence of left foot: Secondary | ICD-10-CM

## 2024-05-12 DIAGNOSIS — R739 Hyperglycemia, unspecified: Secondary | ICD-10-CM

## 2024-05-12 DIAGNOSIS — L7622 Postprocedural hemorrhage and hematoma of skin and subcutaneous tissue following other procedure: Secondary | ICD-10-CM | POA: Insufficient documentation

## 2024-05-12 DIAGNOSIS — Z9889 Other specified postprocedural states: Secondary | ICD-10-CM

## 2024-05-12 DIAGNOSIS — M86672 Other chronic osteomyelitis, left ankle and foot: Secondary | ICD-10-CM

## 2024-05-12 DIAGNOSIS — E1122 Type 2 diabetes mellitus with diabetic chronic kidney disease: Secondary | ICD-10-CM | POA: Insufficient documentation

## 2024-05-12 DIAGNOSIS — Z7984 Long term (current) use of oral hypoglycemic drugs: Secondary | ICD-10-CM | POA: Insufficient documentation

## 2024-05-12 LAB — CBC
HCT: 25.3 % — ABNORMAL LOW (ref 39.0–52.0)
Hemoglobin: 8.4 g/dL — ABNORMAL LOW (ref 13.0–17.0)
MCH: 27.9 pg (ref 26.0–34.0)
MCHC: 33.2 g/dL (ref 30.0–36.0)
MCV: 84.1 fL (ref 80.0–100.0)
Platelets: 201 K/uL (ref 150–400)
RBC: 3.01 MIL/uL — ABNORMAL LOW (ref 4.22–5.81)
RDW: 14.2 % (ref 11.5–15.5)
WBC: 12.9 K/uL — ABNORMAL HIGH (ref 4.0–10.5)
nRBC: 0 % (ref 0.0–0.2)

## 2024-05-12 LAB — BASIC METABOLIC PANEL WITH GFR
Anion gap: 9 (ref 5–15)
BUN: 36 mg/dL — ABNORMAL HIGH (ref 6–20)
CO2: 24 mmol/L (ref 22–32)
Calcium: 8.7 mg/dL — ABNORMAL LOW (ref 8.9–10.3)
Chloride: 101 mmol/L (ref 98–111)
Creatinine, Ser: 2 mg/dL — ABNORMAL HIGH (ref 0.61–1.24)
GFR, Estimated: 43 mL/min — ABNORMAL LOW (ref >60)
Glucose, Bld: 192 mg/dL — ABNORMAL HIGH (ref 70–99)
Potassium: 4.4 mmol/L (ref 3.5–5.1)
Sodium: 134 mmol/L — ABNORMAL LOW (ref 135–145)

## 2024-05-12 NOTE — ED Provider Notes (Signed)
 [place] EMERGENCY DEPARTMENT AT [facility] Provider Note   CSN: [id number] Arrival date & time: 05/12/24  2052     Patient presents with: Post-op Problem   Carl Conley is a 38 y.o. male.   Pt s/p revision debridement, left foot wound 6/11, went to podiatry office today, had sutures removed - pt indicates a little bit of oozing there, but when went home experienced bleeding through bandage. No other abnormal bruising or bleeding. No anticoagulant use. EMS was called. Notes initial bp low, ems gave ivf and current bp is normal.  Pt indicates had been previously walking only on heel, but today was told he could put more weight on foot, and indicates did walk up upstairs.   The history is provided by the patient, medical records and the EMS personnel.       Prior to Admission medications   Medication Sig Start Date End Date Taking? Authorizing Provider  Accu-Chek Softclix Lancets lancets Use to check blood sugar in the morning, at noon, and at bedtime. 04/08/24 05/12/24  Kandis Perkins, DO  acetaminophen  (TYLENOL ) 325 MG tablet Take 2 tablets (650 mg total) by mouth every 6 (six) hours as needed for moderate pain (pain score 4-6). 04/08/24   Kandis Perkins, DO  acyclovir  (ZOVIRAX ) 400 MG tablet Take 1 tablet (400 mg total) by mouth 2 (two) times daily. 04/08/24   Kandis Perkins, DO  Blood Glucose Monitoring Suppl (BLOOD GLUCOSE MONITOR SYSTEM) w/Device KIT Use to check blood sugar in the morning, at noon, and at bedtime. 04/08/24   Kandis Perkins, DO  citalopram  (CELEXA ) 20 MG tablet Take 1 tablet (20 mg total) by mouth daily. Patient taking differently: Take 20 mg by mouth every evening. 11/20/23   Nooruddin, Saad, MD  empagliflozin  (JARDIANCE ) 10 MG TABS tablet Take 1 tablet (10 mg total) by mouth daily before breakfast. 04/30/24   Norrine Sharper, MD  ferrous sulfate  325 (65 FE) MG tablet Take 1 tablet (325 mg total) by mouth every other day. 04/30/24 04/30/25  Norrine Sharper, MD   Glucose Blood (BLOOD GLUCOSE TEST STRIPS) STRP Use to check sugars in the morning, at noon, and at bedtime. 04/08/24 05/12/24  Kandis Perkins, DO  insulin  glargine (LANTUS ) 100 UNIT/ML Solostar Pen Inject 10 Units into the skin daily. 04/30/24   Norrine Sharper, MD  Insulin  Pen Needle (PEN NEEDLES) 32G X 4 MM MISC Use to inject daily. 04/08/24   Kandis Perkins, DO  ketoconazole  (NIZORAL ) 2 % shampoo APPLY 1 APPLICATION TOPICALLY 2 (TWO) TIMES A WEEK. 04/06/24   Nooruddin, Saad, MD  melatonin 5 MG TABS Take 1 tablet (5 mg total) by mouth at bedtime. 04/21/24   Kandis Perkins, DO  rosuvastatin  (CRESTOR ) 20 MG tablet Take 1 tablet (20 mg total) by mouth daily. Patient taking differently: Take 20 mg by mouth every evening. 04/08/24   Kandis Perkins, DO  Semaglutide , 1 MG/DOSE, 4 MG/3ML SOPN Inject 1 mg into the skin once a week. 06/25/24   Norrine Sharper, MD  Semaglutide ,0.25 or 0.5MG /DOS, (OZEMPIC , 0.25 OR 0.5 MG/DOSE,) 2 MG/3ML SOPN Inject 0.25 mg into the skin once a week for 28 days, THEN 0.5 mg once a week for 28 days. 04/30/24 06/25/24  Norrine Sharper, MD    Allergies: Doxycycline     Review of Systems  Constitutional:  Negative for chills, diaphoresis and fever.  HENT:  Negative for nosebleeds.   Respiratory:  Negative for shortness of breath.   Cardiovascular:  Negative for chest pain.  Gastrointestinal:  Negative for abdominal pain and blood in stool.  Genitourinary:  Negative for hematuria.  Neurological:  Negative for syncope.    Updated Vital Signs BP 122/73   Pulse 70   Temp 98.3 F (36.8 C) (Oral)   Resp 18   Ht 1.803 m (5' 11)   Wt 114.5 kg   SpO2 100%   BMI 35.21 kg/m   Physical Exam Vitals and nursing note reviewed.  Constitutional:      Appearance: Normal appearance. He is well-developed.  HENT:     Head: Atraumatic.  Eyes:     General: No scleral icterus.    Conjunctiva/sclera: Conjunctivae normal.  Neck:     Trachea: No tracheal deviation.  Cardiovascular:      Rate and Rhythm: Normal rate.     Pulses: Normal pulses.  Pulmonary:     Effort: Pulmonary effort is normal. No accessory muscle usage or respiratory distress.  Musculoskeletal:        General: No swelling.     Cervical back: Neck supple.     Comments: Dressing to left mid foot amputation saturated with blood. No active or ongoing bleeding noted. Wound is grossly intact, no frank dehiscence although wound edges are separately a few mm from each other.   Skin:    General: Skin is warm and dry.     Findings: No rash.  Neurological:     Mental Status: He is alert.     Comments: Alert, speech clear.   Psychiatric:        Mood and Affect: Mood normal.     (all labs ordered are listed, but only abnormal results are displayed) Results for orders placed or performed during the hospital encounter of 05/12/24  Basic metabolic panel with GFR   Collection Time: 05/12/24  9:31 PM  Result Value Ref Range   Sodium 134 (L) 135 - 145 mmol/L   Potassium 4.4 3.5 - 5.1 mmol/L   Chloride 101 98 - 111 mmol/L   CO2 24 22 - 32 mmol/L   Glucose, Bld 192 (H) 70 - 99 mg/dL   BUN 36 (H) 6 - 20 mg/dL   Creatinine, Ser 7.99 (H) 0.61 - 1.24 mg/dL   Calcium  8.7 (L) 8.9 - 10.3 mg/dL   GFR, Estimated 43 (L) >60 mL/min   Anion gap 9 5 - 15  CBC   Collection Time: 05/12/24  9:31 PM  Result Value Ref Range   WBC 12.9 (H) 4.0 - 10.5 K/uL   RBC 3.01 (L) 4.22 - 5.81 MIL/uL   Hemoglobin 8.4 (L) 13.0 - 17.0 g/dL   HCT 74.6 (L) 60.9 - 47.9 %   MCV 84.1 80.0 - 100.0 fL   MCH 27.9 26.0 - 34.0 pg   MCHC 33.2 30.0 - 36.0 g/dL   RDW 85.7 88.4 - 84.4 %   Platelets 201 150 - 400 K/uL   nRBC 0.0 0.0 - 0.2 %   DG Knee Complete 4 Views Right Result Date: 04/19/2024 CLINICAL DATA:  Right knee pain. EXAM: RIGHT KNEE - COMPLETE 4+ VIEW COMPARISON:  None Available. FINDINGS: No joint effusion. Joint spaces are preserved. No acute fracture is seen. No dislocation. IMPRESSION: No significant osteoarthritis. Electronically  Signed   By: Tanda Lyons M.D.   On: 04/19/2024 13:37     EKG: None  Radiology: No results found.   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 Labs: ordered. Decision-making details documented in ED Course.   New sterile dressing applied.   Reviewed nursing notes and prior charts for additional history.   Labs sent.   Currently vitals normal and no active bleeding.   Labs reviewed/interpreted by me - wbc 12, hct similar to prior. Ckd similar to prior, k normal.   Observed in ED, rechecked wound/dressing, no recurrent bleeding. Vitals normal. No faintness.   Pt currently appears stable for ED d/c.   Rec close pcp/podiatry f/u.  Return precautions provided.        Final diagnoses:  None    ED Discharge Orders     None          Bernard Drivers, MD 05/12/24 2303

## 2024-05-12 NOTE — ED Notes (Signed)
 ED Provider at bedside.

## 2024-05-12 NOTE — ED Triage Notes (Signed)
 PT BIB [facility] from homeb withb a c/o post-op bleeding.PT had amputation of all toes on left foot and had stitches removed around 5pm today. PT noticed bleeding through bandages around 730. PT did travel upstairs and had put pressure on huis foot before the bleeding started.PT has no c/o pain but is light headed and nauseous.BP was 76/40 upon EMS arrival, given 1L of fluid and BP was then 103/75. PT has also had 4mg  of zofran 

## 2024-05-12 NOTE — Discharge Instructions (Signed)
 It was our pleasure to provide your ER care today - we hope that you feel better.  For now, return to walking only on the left heel. Keep wound very clean/dry. Follow up closely with your podiatrist in the next few days - call office tomorrow AM to arrange follow up appointment and to check-in.   For your diabetes, chronic kidney disease, anemia, and other general medical issues, follow up closely with primary care doctor in the next 1-2 weeks.   Return to ER if worse, new symptoms, fevers, recurrent or persistent bleeding, infection of wound, weak/faint, trouble breathing, or other concern.

## 2024-05-12 NOTE — Progress Notes (Signed)
  Subjective:  Patient ID: Carl Conley, male    DOB: 08-06-1986,  MRN: [id number]  DOS: 04/15/24 Procedure: 1.  Left foot irrigation and debridement of hematoma 2.  Application amniotic graft bio Vance single-layer 5 x 5 cm 3.  Complex wound closure 6 x 2 cm, left foot 4.  Application disposable wound VAC therapy 6 x 1 cm, left foot  38 y.o. male seen for post op check.  Patient now approximately 4 weeks out from procedure.   He is now back home.  He has been weightbearing to the heel in postoperative shoe.  Has kept the dressing clean dry and intact since last appointment.  Review of Systems: Negative except as noted in the HPI. Denies N/V/F/Ch.   Objective:   Constitutional Well developed. Well nourished.  Vascular Foot warm and well perfused. Capillary refill normal to all digits.   No calf pain with palpation  Neurologic Normal speech. Oriented to person, place, and time. Epicritic sensation diminished to foot  Dermatologic Left foot Lisfranc amputation site overall healthy.  with no maceration along the incision lines.  Small area of active bleeding along the vertical incision though controlled with pressure.  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  4 weeks s/p above procedures -Progressing as expected post op, much improved from prior with dressing care.  Recommend continue dressing changes  twice to 3 times weekly - Continue 2X weekly Betadine wet-to-dry dressing to the left foot amputation site under some  compression.  Demonstrated this and the patient is able to do this at home -XR: expected post op changes -WB Status: Okay to be  weightbearing in postop shoe to the left lower extremity at this time -Sutures: Removed at this apt, Steri-Strips applied -Medications/ABX: Status post course of postop antibiotics -Dressing: As above continue 2X weekly dressing changes at home until next appointment.  Continue to leave the foot dry do not shower or get it wet or soak it. - F/u Plan: Follow-up in 2 week for recheck        Carl Conley, DPM [facility] / [facility]

## 2024-05-13 NOTE — Telephone Encounter (Unsigned)
 Copied from CRM [id number]. Topic: Appointments - Scheduling Inquiry for Clinic >> May 07, 2024 10:36 AM Marda MATSU wrote: Reason for CRM: Patient is being discharged from [facility] per Darryle ,  she said his 7/24 appointment is soon enough. Will it be okay to do his follow up? If so just notate in appt notes. If not please call patient and schedule. >> May 13, 2024  8:51 AM CMA Hme R wrote: I called and spoke with pt, he will keep his appt with [facility] on 05/28/2024. Advised pt to call the clinic if he needs further assistance.

## 2024-05-13 NOTE — ED Notes (Signed)
 RN cleaned and bandaged the pt's foot . Ortho also fit PT for a surgical shoe

## 2024-05-13 NOTE — Progress Notes (Signed)
 Orthopedic Tech Progress Note Patient Details:  Carl Conley [birth year] [id number]  Ortho Devices Type of Ortho Device: Postop shoe/boot Ortho Device/Splint Location: LLE Ortho Device/Splint Interventions: Ordered, Application, Adjustment   Post Interventions Patient Tolerated: Other (comment) Instructions Provided: Other (comment), Poper ambulation with device Rn stated that patient's foot burst open again, ortho techs sized shoe and delivered to patients room.  Camellia Bo 05/13/2024, 1:11 AM

## 2024-05-13 NOTE — ED Notes (Signed)
 PT walked to the bathroom and his foot has started to bleed through the bandages again.

## 2024-05-19 ENCOUNTER — Ambulatory Visit (INDEPENDENT_AMBULATORY_CARE_PROVIDER_SITE_OTHER): Admitting: Podiatry

## 2024-05-19 ENCOUNTER — Encounter: Payer: Self-pay | Admitting: Podiatry

## 2024-05-19 DIAGNOSIS — Z89432 Acquired absence of left foot: Secondary | ICD-10-CM

## 2024-05-19 DIAGNOSIS — Z9889 Other specified postprocedural states: Secondary | ICD-10-CM

## 2024-05-19 DIAGNOSIS — M86672 Other chronic osteomyelitis, left ankle and foot: Secondary | ICD-10-CM

## 2024-05-19 NOTE — Progress Notes (Signed)
  Subjective:  Patient ID: Carl Conley, male    DOB: [birth year],  MRN: [id number]  DOS: 04/15/24 Procedure: 1.  Left foot irrigation and debridement of hematoma 2.  Application amniotic graft bio Vance single-layer 5 x 5 cm 3.  Complex wound closure 6 x 2 cm, left foot 4.  Application disposable wound VAC therapy 6 x 1 cm, left foot  38 y.o. male seen for post op check.  Patient now approximately 5 weeks out from procedure.   .  He has been weightbearing to the heel in postoperative shoe.  Has kept the dressing clean dry and intact since being to the emergency department last week after his appointment due to some bleeding that occurred when he got home and try to go up stairs.  They report there is been no more bleeding since that time.  Review of Systems: Negative except as noted in the HPI. Denies N/V/F/Ch.   Objective:   Constitutional Well developed. Well nourished.  Vascular Foot warm and well perfused. Capillary refill normal to all digits.   No calf pain with palpation  Neurologic Normal speech. Oriented to person, place, and time. Epicritic sensation diminished to foot  Dermatologic Left foot Lisfranc amputation site overall healthy.  with no maceration along the incision lines.  Small area of open wound to subcutaneous fat tissue layer along the transverse incision line though small from prior and healing in very nicely.  Overall much improved from last visit.  Some coagulated blood along the lateral aspect.     Orthopedic: S/p Lisfranc amputation of left foot   Radiographs: lisfranc amputation   Pathology: none sent from revision OR  Micro: ABUNDANT KLEBSIELLA PNEUMONIAE from hematoma culture  Assessment:   Left foot lisfranc amputation site dehiscence and hematoma  S/p Irrigation and debridement with graft, closure and incisional wound vac placement  Plan:  Patient was evaluated and treated and all questions answered.  5 weeks s/p above  procedures -Progressing as expected post op, did have some bleeding to the surgical site after sutures were removed last week.  Since improved after going to the ED and staying off her foot - Continue 2X weekly Betadine wet-to-dry dressing to the left foot amputation site under some compression.  Demonstrated this and the patient is able to do this at home -XR: expected post op changes -WB Status: Recommend he stay off the left foot is much as he is able to to ensure continued healing prevent bleeding episodes -Sutures: Previously removed -Medications/ABX: Status post course of postop antibiotics -Dressing: As above continue 2X weekly dressing changes at home until next appointment.  Continue to leave the foot dry do not shower or get it wet or soak it. - F/u Plan: Follow-up in 1 week for recheck        Marolyn JULIANNA Honour, DPM [facility] / [facility]

## 2024-05-26 ENCOUNTER — Encounter: Admitting: Podiatry

## 2024-05-26 ENCOUNTER — Ambulatory Visit (INDEPENDENT_AMBULATORY_CARE_PROVIDER_SITE_OTHER): Admitting: Podiatry

## 2024-05-26 DIAGNOSIS — M86672 Other chronic osteomyelitis, left ankle and foot: Secondary | ICD-10-CM

## 2024-05-26 DIAGNOSIS — Z9889 Other specified postprocedural states: Secondary | ICD-10-CM

## 2024-05-26 DIAGNOSIS — Z89432 Acquired absence of left foot: Secondary | ICD-10-CM

## 2024-05-26 NOTE — Progress Notes (Signed)
  Subjective:  Patient ID: Carl Conley, male    DOB: [birth year],  MRN: [id number]  DOS: 04/15/24 Procedure: 1.  Left foot irrigation and debridement of hematoma 2.  Application amniotic graft bio Vance single-layer 5 x 5 cm 3.  Complex wound closure 6 x 2 cm, left foot 4.  Application disposable wound VAC therapy 6 x 1 cm, left foot  38 y.o. male seen for post op check.  Patient now approximately 6 weeks out from procedure.  He reports that he feels the wound is nearly fully healed he has been walking in postop shoe.  Denies any pain denies any drainage at this point.  Review of Systems: Negative except as noted in the HPI. Denies N/V/F/Ch.   Objective:   Constitutional Well developed. Well nourished.  Vascular Foot warm and well perfused. Capillary refill normal to all digits.   No calf pain with palpation  Neurologic Normal speech. Oriented to person, place, and time. Epicritic sensation diminished to foot  Dermatologic Left foot Lisfranc amputation site overall healthy.  Much improved healing from prior.  There is a very small central area of superficial ulceration at the central aspect of the amputation site however this is improved from prior and nearly full healing has occurred at this point in time.      Orthopedic: S/p Lisfranc amputation of left foot   Radiographs: lisfranc amputation   Pathology: none sent from revision OR  Micro: ABUNDANT KLEBSIELLA PNEUMONIAE from hematoma culture  Assessment:   Left foot lisfranc amputation site dehiscence and hematoma  S/p Irrigation and debridement with graft, closure and incisional wound vac placement  Plan:  Patient was evaluated and treated and all questions answered.  6 weeks s/p above procedures -Progressing as expected post op, much improved from prior with near complete healing at this time - Continue Betadine and adhesive bandage to the small open wound which was debrided lightly with tissue nipper today.   Silver  nitrate stick was used for hemostasis. -XR: expected post op changes -WB Status: Transition back to regular shoe when he is able to otherwise use postop shoe -Sutures: Previously removed -Medications/ABX: Status post course of postop antibiotics -Dressing: He can now begin to wash the foot with warm soapy water and dry with Betadine/cleansed with Betadine after and apply adhesive bandage - F/u Plan: Patient is moving to [facility]  and reports that he would like to follow-up with a provider that is closer to that area.  I recommended he follow-up with the [facility] podiatrist in [place] and provided their number.  If he has any issues getting in with them he is welcome to follow-up with me in 2 to 3 weeks otherwise follow-up as needed.  Nearly fully healed expect he will have continued healing over the course of the next week to 2 weeks and should be fully healed         Marolyn JULIANNA Honour, DPM [facility] / [facility]

## 2024-05-28 ENCOUNTER — Ambulatory Visit: Admitting: Student

## 2024-05-28 VITALS — BP 132/81 | HR 86 | Temp 99.1°F | Ht 71.0 in | Wt 249.2 lb

## 2024-05-28 DIAGNOSIS — Z89439 Acquired absence of unspecified foot: Secondary | ICD-10-CM | POA: Diagnosis not present

## 2024-05-28 DIAGNOSIS — Z794 Long term (current) use of insulin: Secondary | ICD-10-CM | POA: Diagnosis not present

## 2024-05-28 DIAGNOSIS — Z7985 Long-term (current) use of injectable non-insulin antidiabetic drugs: Secondary | ICD-10-CM

## 2024-05-28 DIAGNOSIS — Z7984 Long term (current) use of oral hypoglycemic drugs: Secondary | ICD-10-CM | POA: Diagnosis not present

## 2024-05-28 DIAGNOSIS — E1169 Type 2 diabetes mellitus with other specified complication: Secondary | ICD-10-CM

## 2024-05-28 DIAGNOSIS — E785 Hyperlipidemia, unspecified: Secondary | ICD-10-CM | POA: Insufficient documentation

## 2024-05-28 DIAGNOSIS — N1832 Chronic kidney disease, stage 3b: Secondary | ICD-10-CM | POA: Diagnosis not present

## 2024-05-28 DIAGNOSIS — E1122 Type 2 diabetes mellitus with diabetic chronic kidney disease: Secondary | ICD-10-CM | POA: Diagnosis present

## 2024-05-28 DIAGNOSIS — M869 Osteomyelitis, unspecified: Secondary | ICD-10-CM

## 2024-05-28 DIAGNOSIS — E1159 Type 2 diabetes mellitus with other circulatory complications: Secondary | ICD-10-CM

## 2024-05-28 NOTE — Assessment & Plan Note (Signed)
 Last LDL checked a year ago was elevated at 141. On Crestor  20mg , will continue for now and recheck today.

## 2024-05-28 NOTE — Assessment & Plan Note (Signed)
 Complicated by osteomyelitis and CKD3b. Doing well on Ozempic  .25mg , two more weeks until increase to .5mg , Jardiance , as well as Lantus  10. Declines any low blood sugars, or symptoms of hypoglycemia. Has lost 11 pounds since last time he was weighed. Next A1c check is due for one month, however pt moving to [facility] and may establish care there.

## 2024-05-28 NOTE — Assessment & Plan Note (Signed)
 Healing well, follows with podiatry and his last visit was two days ago. Pictures under physical exam section. He denies any drainage from the area or systemic symptoms such as fevers or chills.

## 2024-05-28 NOTE — Progress Notes (Signed)
 CC: Chronic condition follow up   HPI:  [name] is a 38 y.o. male living with a history stated below and presents today for chronic condition follow up. Please see problem based assessment and plan for additional details.  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   Depression 10/31/2016   Diabetes mellitus (HCC)    dx date 2004 at age 59   Low back pain 09/01/2016   Visual snow syndrome     Current Outpatient Medications on File Prior to Visit  Medication Sig Dispense Refill   acetaminophen  (TYLENOL ) 325 MG tablet Take 2 tablets (650 mg total) by mouth every 6 (six) hours as needed for moderate pain (pain score 4-6). 30 tablet 0   acyclovir  (ZOVIRAX ) 400 MG tablet Take 1 tablet (400 mg total) by mouth 2 (two) times daily. 60 tablet 0   Blood Glucose Monitoring Suppl (BLOOD GLUCOSE MONITOR SYSTEM) w/Device KIT Use to check blood sugar in the morning, at noon, and at bedtime. 1 kit 0   citalopram  (CELEXA ) 20 MG tablet Take 1 tablet (20 mg total) by mouth daily. (Patient taking differently: Take 20 mg by mouth every evening.) 90 tablet 2   empagliflozin  (JARDIANCE ) 10 MG TABS tablet Take 1 tablet (10 mg total) by mouth daily before breakfast. 30 tablet 3   ferrous sulfate  325 (65 FE) MG tablet Take 1 tablet (325 mg total) by mouth every other day.     insulin  glargine (LANTUS ) 100 UNIT/ML Solostar Pen Inject 10 Units into the skin daily.     Insulin  Pen Needle (PEN NEEDLES) 32G X 4 MM MISC Use to inject daily. 100 each 0   ketoconazole  (NIZORAL ) 2 % shampoo APPLY 1 APPLICATION TOPICALLY 2 (TWO) TIMES A WEEK. 120 mL 0   melatonin 5 MG TABS Take 1 tablet (5 mg total) by mouth at bedtime.     rosuvastatin  (CRESTOR ) 20 MG tablet Take 1 tablet (20 mg total) by mouth daily. (Patient taking differently: Take 20 mg by mouth every evening.) 90 tablet 1   [START ON 06/25/2024] Semaglutide , 1 MG/DOSE, 4 MG/3ML SOPN Inject 1 mg into the skin  once a week. 3 mL 1   Semaglutide ,0.25 or 0.5MG /DOS, (OZEMPIC , 0.25 OR 0.5 MG/DOSE,) 2 MG/3ML SOPN Inject 0.25 mg into the skin once a week for 28 days, THEN 0.5 mg once a week for 28 days. 3 mL 1   No current facility-administered medications on file prior to visit.    Family History  Problem Relation Age of Onset   Depression Mother     Social History   Socioeconomic History   Marital status: Single    Spouse name: Not on file   Number of children: 0   Years of education: BA   Highest education level: Bachelor's degree (e.g., BA, AB, BS)  Occupational History   Occupation: N/A  Tobacco Use   Smoking status: Never   Smokeless tobacco: Never  Vaping Use   Vaping status: Never Used  Substance and Sexual Activity   Alcohol use: No   Drug use: No   Sexual activity: Not on file  Other Topics Concern   Not on file  Social History Narrative   Denies caffeine use   Social Drivers of Health   Financial Resource Strain: Patient Declined (05/28/2024)   Overall Financial Resource Strain (CARDIA)    Difficulty of Paying Living Expenses: Patient declined  Food Insecurity: Patient  Declined (05/28/2024)   Hunger Vital Sign    Worried About Running Out of Food in the Last Year: Patient declined    Ran Out of Food in the Last Year: Patient declined  Transportation Needs: Patient Declined (05/28/2024)   PRAPARE - Administrator, Civil Service (Medical): Patient declined    Lack of Transportation (Non-Medical): Patient declined  Physical Activity: Insufficiently Active (05/28/2024)   Exercise Vital Sign    Days of Exercise per Week: 7 days    Minutes of Exercise per Session: 10 min  Stress: No Stress Concern Present (05/28/2024)   [employer] of Occupational Health - Occupational Stress Questionnaire    Feeling of Stress: Not at all  Social Connections: Patient Declined (05/28/2024)   Social Connection and Isolation Panel    Frequency of Communication with Friends and  Family: Patient declined    Frequency of Social Gatherings with Friends and Family: Patient declined    Attends Religious Services: Patient declined    Database administrator or Organizations: Patient declined    Attends Banker Meetings: Patient declined    Marital Status: Patient declined  Intimate Partner Violence: Not At Risk (04/15/2024)   Humiliation, Afraid, Rape, and Kick questionnaire    Fear of Current or Ex-Partner: No    Emotionally Abused: No    Physically Abused: No    Sexually Abused: No    Review of Systems: ROS negative except for what is noted on the assessment and plan.  Vitals:   05/28/24 1413  BP: 132/81  Pulse: 86  Temp: 99.1 F (37.3 C)  TempSrc: Oral  SpO2: 94%  Weight: 249 lb 3.2 oz (113 kg)  Height: 5' 11 (1.803 m)    Physical Exam: Constitutional: well-appearing male  in no acute distress Cardiovascular: regular rate and rhythm, no m/r/g Pulmonary/Chest: normal work of breathing on room air, lungs clear to auscultation bilaterally Abdominal: soft, non-tender, non-distended MSK: normal bulk and tone Skin: warm and dry, L TMA well healing    Assessment & Plan:   History of transmetatarsal amputation of foot (HCC) Healing well, follows with podiatry and his last visit was two days ago. Pictures under physical exam section. He denies any drainage from the area or systemic symptoms such as fevers or chills.   Diabetes (HCC) Complicated by osteomyelitis and CKD3b. Doing well on Ozempic  .25mg , two more weeks until increase to .5mg , Jardiance , as well as Lantus  10. Declines any low blood sugars, or symptoms of hypoglycemia. Has lost 11 pounds since last time he was weighed. Next A1c check is due for one month, however pt moving to [facility] and may establish care there.    Patient discussed with Dr. Karna Dirks Derriana Oser, M.D. [facility], PGY-3 Pager: [phone] Date 05/28/2024 Time 3:47 PM

## 2024-05-28 NOTE — Patient Instructions (Signed)
 Thank you so much for coming to the clinic today!   I'm glad you're doing so well!  We are going to recheck your cholesterol today, I'll give you a call if we need to change anything.   If you have any questions please feel free to the call the clinic at anytime at [phone]. It was a pleasure seeing you!  Best, Dr. Lori Popowski

## 2024-05-29 ENCOUNTER — Ambulatory Visit: Payer: Self-pay | Admitting: Student

## 2024-05-29 LAB — LIPID PANEL
Chol/HDL Ratio: 4 ratio (ref 0.0–5.0)
Cholesterol, Total: 108 mg/dL (ref 100–199)
HDL: 27 mg/dL — ABNORMAL LOW (ref >39)
LDL Chol Calc (NIH): 49 mg/dL (ref 0–99)
Triglycerides: 196 mg/dL — ABNORMAL HIGH (ref 0–149)
VLDL Cholesterol Cal: 32 mg/dL (ref 5–40)

## 2024-05-29 NOTE — Progress Notes (Signed)
 Internal Medicine Clinic Attending  Case discussed with the resident at the time of the visit.  We reviewed the resident's history and exam and pertinent patient test results.  I agree with the assessment, diagnosis, and plan of care documented in the resident's note.

## 2024-06-01 ENCOUNTER — Other Ambulatory Visit: Payer: Self-pay | Admitting: Student

## 2024-06-01 ENCOUNTER — Ambulatory Visit: Payer: Self-pay | Admitting: Student

## 2024-06-01 DIAGNOSIS — E1159 Type 2 diabetes mellitus with other circulatory complications: Secondary | ICD-10-CM

## 2024-06-01 MED ORDER — INSULIN GLARGINE 100 UNIT/ML SOLOSTAR PEN: 10.0000 [IU] | PEN_INJECTOR | Freq: Every day | SUBCUTANEOUS | 1 refills | Status: DC

## 2024-06-01 NOTE — Telephone Encounter (Signed)
 Copied from CRM [id number]. Topic: Clinical - Medication Refill >> Jun 01, 2024 10:33 AM Carmell R wrote: Medication: insulin  glargine (LANTUS ) 100 UNIT/ML Solostar Pen  Has the patient contacted their pharmacy? No. Pt switching pharmacy  This is the patient's preferred pharmacy:  [id number] - [place], [place] - [address] AT [facility] [address] [place] [address] Phone: [phone] Fax: [phone]  Is this the correct pharmacy for this prescription? Yes  Has the prescription been filled recently? Yes  Is the patient out of the medication? Yes, used last dose earlier today  Has the patient been seen for an appointment in the last year OR does the patient have an upcoming appointment? Yes  Can we respond through MyChart? Yes  Agent: Please be advised that Rx refills may take up to 3 business days. We ask that you follow-up with your pharmacy.

## 2024-06-02 ENCOUNTER — Other Ambulatory Visit: Payer: Self-pay | Admitting: Student

## 2024-06-02 DIAGNOSIS — E1159 Type 2 diabetes mellitus with other circulatory complications: Secondary | ICD-10-CM

## 2024-06-02 MED ORDER — INSULIN GLARGINE 100 UNIT/ML SOLOSTAR PEN: 10.0000 [IU] | PEN_INJECTOR | Freq: Every day | SUBCUTANEOUS | 1 refills | Status: AC

## 2024-06-02 NOTE — Telephone Encounter (Signed)
 Copied from CRM #[id number]. Topic: Clinical - Medication Refill >> Jun 02, 2024 11:53 AM Carrielelia G wrote: Medication: insulin  glargine (LANTUS ) 100 UNIT/ML Solostar Pen  (ask if it can be called in today, he is out of meds)   Has the patient contacted their pharmacy? Yes (Agent: If no, request that the patient contact the pharmacy for the refill. If patient does not wish to contact the pharmacy document the reason why and proceed with request.) (Agent: If yes, when and what did the pharmacy advise?)  This is the patient's preferred pharmacy:  [id number] [place], [place] - [address] [place] [address] Phone: [phone] Fax: [phone]    Is this the correct pharmacy for this prescription? Yes If no, delete pharmacy and type the correct one.    Is the patient out of the medication? Yes  Has the patient been seen for an appointment in the last year OR does the patient have an upcoming appointment? Yes  Can we respond through MyChart? Yes  Agent: Please be advised that Rx refills may take up to 3 business days. We ask that you follow-up with your pharmacy.

## 2024-06-02 NOTE — Telephone Encounter (Signed)
 Rx was written as No Print; re-sending rx as Normal.

## 2024-06-15 ENCOUNTER — Other Ambulatory Visit: Payer: Self-pay

## 2024-06-15 DIAGNOSIS — F32A Depression, unspecified: Secondary | ICD-10-CM

## 2024-06-16 MED ORDER — CITALOPRAM HYDROBROMIDE 20 MG PO TABS: 20.0000 mg | ORAL_TABLET | Freq: Every day | ORAL | 2 refills | Status: AC
